# Patient Record
Sex: Female | Born: 1965 | Hispanic: Yes | Marital: Single | Smoking: Never smoker
Health system: Southern US, Community
[De-identification: ages and names within clinical notes are randomized; demographics above are authoritative.]

## PROBLEM LIST (undated history)

## (undated) DIAGNOSIS — T7840XA Allergy, unspecified, initial encounter: Secondary | ICD-10-CM

## (undated) DIAGNOSIS — R42 Dizziness and giddiness: Secondary | ICD-10-CM

## (undated) DIAGNOSIS — E7800 Pure hypercholesterolemia, unspecified: Secondary | ICD-10-CM

## (undated) DIAGNOSIS — M1990 Unspecified osteoarthritis, unspecified site: Secondary | ICD-10-CM

## (undated) DIAGNOSIS — G709 Myoneural disorder, unspecified: Secondary | ICD-10-CM

## (undated) DIAGNOSIS — M7711 Lateral epicondylitis, right elbow: Secondary | ICD-10-CM

## (undated) DIAGNOSIS — G8918 Other acute postprocedural pain: Secondary | ICD-10-CM

## (undated) DIAGNOSIS — Z9889 Other specified postprocedural states: Secondary | ICD-10-CM

## (undated) DIAGNOSIS — Z1231 Encounter for screening mammogram for malignant neoplasm of breast: Secondary | ICD-10-CM

## (undated) DIAGNOSIS — Z01818 Encounter for other preprocedural examination: Secondary | ICD-10-CM

## (undated) DIAGNOSIS — M5126 Other intervertebral disc displacement, lumbar region: Secondary | ICD-10-CM

## (undated) DIAGNOSIS — M48062 Spinal stenosis, lumbar region with neurogenic claudication: Secondary | ICD-10-CM

## (undated) DIAGNOSIS — Z01419 Encounter for gynecological examination (general) (routine) without abnormal findings: Secondary | ICD-10-CM

## (undated) DIAGNOSIS — E669 Obesity, unspecified: Secondary | ICD-10-CM

## (undated) DIAGNOSIS — Z1211 Encounter for screening for malignant neoplasm of colon: Secondary | ICD-10-CM

## (undated) DIAGNOSIS — G4733 Obstructive sleep apnea (adult) (pediatric): Secondary | ICD-10-CM

## (undated) DIAGNOSIS — M25539 Pain in unspecified wrist: Secondary | ICD-10-CM

## (undated) HISTORY — DX: Allergy, unspecified, initial encounter: T78.40XA

## (undated) HISTORY — PX: CERVICAL BIOPSY  W/ LOOP ELECTRODE EXCISION: SUR135

## (undated) HISTORY — PX: DILATION AND CURETTAGE OF UTERUS: SHX78

## (undated) HISTORY — DX: Myoneural disorder, unspecified: G70.9

## (undated) HISTORY — DX: Unspecified osteoarthritis, unspecified site: M19.90

## (undated) HISTORY — DX: Pure hypercholesterolemia, unspecified: E78.00

## (undated) HISTORY — DX: Dizziness and giddiness: R42

## (undated) MED ORDER — METRONIDAZOLE 500 MG TAB
500 mg | ORAL_TABLET | Freq: Two times a day (BID) | ORAL | Status: AC
Start: ? — End: 2012-10-19

---

## 1999-06-11 NOTE — ED Provider Notes (Signed)
[facility]                      EMERGENCY DEPARTMENT TREATMENT REPORT   NAME:  [name] [name], [name] [name]   MR #:  [id number]   BILLING #: [id number]        DOS: 06/11/1999  TIME: 6:39 P   cc:   Primary Physician:   CHIEF COMPLAINT:  Cough.   HISTORY OF PRESENT ILLNESS:   The patient is a 34 year old female with a   two week history of anterior chest pain, nonproductive coughing starting   out initially as a upper respiratory infection.  She was experiencing   increased coughing today and a sensation of "tight breathing" that lasted   approximately a half an hour. Her discomfort is somewhat pleuritic.  She   has had recurrent mild episodes of wheezing.  She denies purulent sputums.   Because of the progression and severity of the patient's symptoms, they   felt obligated to come to the Emergency Department for treatment.   REVIEW OF SYSTEMS:   GENERAL:   She denies chills, fever.   ENT:  Above.   CARDIOVASCULAR:  No chest pain, chest pressure, or palpitations.   GASTROINTESTINAL:  No vomiting, diarrhea, or abdominal pain.   GENITOURINARY:  No dysuria, frequency, or urgency.   NEUROLOGICAL:  No headaches, sensory or motor symptoms.   Denies complaints in any other system.   PAST MEDICAL HISTORY:   Negative for asthma.  The only treatment she has   had was with pregnancy induced hypertension, is not currently hypertensive.   Does not have  primary care physician.   SOCIAL HISTORY:  Negative for current tobacco or alcohol use.   ALLERGIES:   None.   MEDICATIONS:   None.   PHYSICAL EXAMINATION:   GENERAL:   Alert and appropriate.   VITAL SIGNS:   Blood pressure 129/66, pulse 91, respirations 18,   temperature 98.5, O2 sat 98% on room air.   ENT:  Mouth/Throat:  Surfaces of the pharynx, palate, and tongue are pink,   moist, and without lesions.   RESPIRATORY:  Clear and equal BS.  No respiratory distress, tachypnea, or   accessory muscle use.    CARDIOVASCULAR:  Heart regular, without murmurs, gallops, rubs, or thrills.   PMI not displaced.   GASTROINTESTINAL:  Abdomen soft, nontender, without complaint of pain to   palpation.  No hepatomegaly or splenomegaly.   SKIN:  Warm and dry without rashes.   MUSCULOSKELETAL:   Stance and gait appear normal.   NEUROLOGICAL:   Cranial nerves, deep tendon reflexes, strength, and light   touch sensation are unremarkable.   DIAGNOSTIC TESTING:   EKG was a normal sinus rhythm without ischemic   changes.  Chest x-ray interpreted by the radiologist was negative.   COURSE IN THE EMERGENCY DEPARTMENT:   The patient remained stable.  The   patient's family was in the department and kept up-to-date on the patient's   condition and test results.   FINAL DIAGNOSIS:   1.  Acute bronchitis.   DISPOSITION:    The patient was discharged with Erythromycin, Tussionex as   needed.  She was given the office number of the on-call primary care   physician or she may return to the Emergency Department any time should   there be a change in the patient's condition or the onset of new or   worsening symptoms.   Electronically Signed By:   Daine Gravel  Claudette Laws, M.D. 06/23/1999 19:36   ____________________________   Thornton Dales, M.D.   ec D:  06/11/1999 T:  06/14/1999 12:18 P   [id number]

## 1999-09-06 ENCOUNTER — Emergency Department (HOSPITAL_COMMUNITY): Admission: EM | Admit: 1999-09-06 | Discharge: 1999-09-06 | Payer: Self-pay | Admitting: Emergency Medicine

## 2000-01-12 ENCOUNTER — Other Ambulatory Visit: Admission: RE | Admit: 2000-01-12 | Discharge: 2000-01-12 | Payer: Self-pay | Admitting: Gynecology

## 2001-01-13 ENCOUNTER — Encounter: Admission: RE | Admit: 2001-01-13 | Discharge: 2001-01-13 | Payer: Self-pay | Admitting: Emergency Medicine

## 2001-01-13 ENCOUNTER — Encounter: Payer: Self-pay | Admitting: Emergency Medicine

## 2001-01-25 ENCOUNTER — Other Ambulatory Visit: Admission: RE | Admit: 2001-01-25 | Discharge: 2001-01-25 | Payer: Self-pay | Admitting: Gynecology

## 2001-11-10 ENCOUNTER — Other Ambulatory Visit: Admission: RE | Admit: 2001-11-10 | Discharge: 2001-11-10 | Payer: Self-pay | Admitting: *Deleted

## 2001-11-14 ENCOUNTER — Ambulatory Visit (HOSPITAL_COMMUNITY): Admission: RE | Admit: 2001-11-14 | Discharge: 2001-11-14 | Payer: Self-pay | Admitting: *Deleted

## 2001-11-14 ENCOUNTER — Encounter (INDEPENDENT_AMBULATORY_CARE_PROVIDER_SITE_OTHER): Payer: Self-pay | Admitting: Specialist

## 2002-01-19 ENCOUNTER — Other Ambulatory Visit: Admission: RE | Admit: 2002-01-19 | Discharge: 2002-01-19 | Payer: Self-pay | Admitting: *Deleted

## 2002-07-17 ENCOUNTER — Other Ambulatory Visit: Admission: RE | Admit: 2002-07-17 | Discharge: 2002-07-17 | Payer: Self-pay | Admitting: Gynecology

## 2003-01-17 ENCOUNTER — Inpatient Hospital Stay (HOSPITAL_COMMUNITY): Admission: AD | Admit: 2003-01-17 | Discharge: 2003-01-19 | Payer: Self-pay | Admitting: Gynecology

## 2003-03-08 ENCOUNTER — Other Ambulatory Visit: Admission: RE | Admit: 2003-03-08 | Discharge: 2003-03-08 | Payer: Self-pay | Admitting: Internal Medicine

## 2003-03-24 HISTORY — PX: VAGINAL HYSTERECTOMY: SUR661

## 2003-10-23 ENCOUNTER — Encounter (INDEPENDENT_AMBULATORY_CARE_PROVIDER_SITE_OTHER): Payer: Self-pay | Admitting: Specialist

## 2003-10-23 ENCOUNTER — Observation Stay (HOSPITAL_COMMUNITY): Admission: RE | Admit: 2003-10-23 | Discharge: 2003-10-25 | Payer: Self-pay | Admitting: Gynecology

## 2003-11-02 NOTE — ED Provider Notes (Signed)
[facility]                      EMERGENCY DEPARTMENT TREATMENT REPORT   NAME:  [name] [name], [name] [name]                         PT. LOCATION:     ER  ER08   MR #:         BILLING #: [id number]          DOS: 11/02/2003   TIME: 5:48 P   43-28-53   cc:   Primary Physician:   CHIEF COMPLAINT: Left lower quadrant pain.   HISTORY OF PRESENT ILLNESS:  This is a 38 year old female who complains of   intermittent left lower quadrant pain sharp in nature for about 3 days.   She says she gets hot and cold with this, some nausea, negative   vomiting/diarrhea, negative change in bowel habits with a change in color   or frequency, negative change in urinary habits.  Denies any vaginal   discharge or bleeding. She states the first day of her last menstrual   period was 10-22-03 which was a little bit heavier than normal, but   otherwise no complications or changes.  She has negative fevers.   REVIEW OF SYSTEMS   CONSTITUTIONAL:  No fever, chills, weight loss.   EYES: No visual symptoms.   ENT: No sore throat, runny nose or other URI symptoms.   ENDOCRINE:  No diabetic symptoms.   HEMATOLOGIC/LYMPHATIC:  No excessive bruising or lymph node swelling.   ALLERGIC/IMMUNOLOGIC:  No urticaria or allergy symptoms.   RESPIRATORY:  No cough, shortness of breath, or wheezing.   CARDIOVASCULAR:  No chest pain, chest pressure, or palpitations.   GASTROINTESTINAL:   Left lower quadrant pain.   GENITOURINARY:  No dysuria, frequency, or urgency.   MUSCULOSKELETAL:  No joint pain or swelling.   INTEGUMENTARY:  No rashes.   NEUROLOGICAL:  No headaches, sensory or motor symptoms.   PSYCHIATRIC:  No suicidal or homicidal ideation.   PAST MEDICAL HISTORY:  Includes environmental allergies.   ALLERGIES:  No known drug allergies.   MEDICATIONS:  Rhinocort.   SOCIAL HISTORY: The patient is a nonsmoker.   PAST SURGICAL HISTORY:  Includes cholecystectomy and tubal ligation 6 years   ago.   PHYSICAL EXAMINATION:    GENERAL:   The patient is alert, oriented x3, no apparent distress.   VITAL SIGNS:  Blood pressure 144/83, pulse 100, respirations 18,   temperature 99.2.   GENERAL APPEARANCE:  The patient appears well developed and well nourished.   Appearance and behavior are age and situation appropriate.   HEENT:   Eyes:  Conjunctivae clear, lids normal.  Pupils equal,   symmetrical, and normally reactive.   Ears/Nose:  Hearing is grossly intact   to voice.  Internal and external examinations of the ears are unremarkable.   Mouth/Throat:  Surfaces of the pharynx, palate, and tongue are pink, moist,   and without lesions.   RESPIRATORY:  Clear and equal breath sounds.  No respiratory distress,   tachypnea, or accessory muscle use.   CARDIOVASCULAR:  Heart regular, without murmurs, gallops, rubs, or thrills.   PMI not displaced.   GASTROINTESTINAL:   No abdominal or inguinal masses appreciated by   inspection or palpation. Positive mild tenderness to palpation in the left   lower quadrant.  Negative rebound, rigidity or guarding.  Bowel sounds  positive.   GENITOURINARY:  Gynecologic examination:  Negative external lesions,   negative discharge, negative blood.  The os is visualized, closed.   Negative cervical motion tenderness, negative ovarian masses appreciated,   negative ovarian tenderness.   SKIN:  Warm and dry without rashes.   NEUROLOGIC:  Cranial nerves, deep tendon reflexes, strength, and light   touch sensation are unremarkable.   PSYCHIATRIC:  Judgment appears appropriate. Recent and remote memory   appears to be intact.    Oriented to time, place and person.  Mood and   affect appropriate.   INITIAL ASSESSMENT AND MANAGEMENT PLAN:  The patient will have abdominal   laboratories for abdominal pain etiology versus urine for genitourinary and   STD workup with wet prep for possible gynecologic.  This is a new problem   for this patient.  Nursing notes were reviewed.   CONTINUATION BY DR. Barry Dienes:    COURSE IN THE EMERGENCY DEPARTMENT:   The patient remained stable in the   emergency department.  Had no further exacerbation of symptoms.  Was   walking up and around in the room, dressed when I went to go follow up on   him.  Repeat vital signs were blood pressure 150/89, pulse 89, respirations   18, temperature 98.   LABORATORY STUDIES:  Urine was negative for nitrites, blood, leukocytes,   and pregnancy.  General chemistry panel was completely normal.  Liver   enzymes normal.  Amylase was 54, lipase 240.  Pelvic microscopic:  Negative   yeast or trichomonas, no clue cells.  CBC with white count 5.9, hemoglobin   9.3, hematocrit 29.4, platelets 319,000, segmented neutrophils 55%.   DIAGNOSIS:   Intermittent left lower quadrant pain/ abdominal pain.   DISPOSITION/PLAN: The patient discharged home. Instructed to return to the   emergency department if worsening or further concerns, bloody stools.   Electronically Signed By:   Pasty Arch, M.D. 11/03/2003 21:29   ____________________________   Pasty Arch, M.D.   rew/jdm  D:  11/02/2003  T:  11/03/2003 10:18 A   [id number]

## 2004-10-14 ENCOUNTER — Encounter (INDEPENDENT_AMBULATORY_CARE_PROVIDER_SITE_OTHER): Payer: Self-pay | Admitting: *Deleted

## 2004-10-14 ENCOUNTER — Ambulatory Visit: Payer: Self-pay | Admitting: Obstetrics and Gynecology

## 2004-11-20 ENCOUNTER — Ambulatory Visit: Payer: Self-pay | Admitting: Obstetrics and Gynecology

## 2005-09-18 NOTE — ED Provider Notes (Signed)
[facility]                      EMERGENCY DEPARTMENT TREATMENT REPORT   AMENDED COPY   NAME:  [name] [name], [name] [name]                     PT. LOCATION:   MR #:         BILLING #: [id number]          DOS: 09/18/2005   TIME: 5:19 P   43-28-53   cc:   Primary Physician:   CHIEF COMPLAINT:  Clotting.   HISTORY OF PRESENT ILLNESS:  This is a 40 year old female who presents to   the emergency department complaining that, with her menstrual cycles, she   has noticed large amounts of blood clots.  She has had no abdominal pain.   No back pain.  No fevers or chills.  No nausea or vomiting.  States that   she used to have increased vaginal bleeding after being put on birth   control pills that slowed down.  She has noticed that, on the 1st 1-2 days,   she will have some minimal clotting; but, this time on her cycle, she has   noticed large amounts of clots but no increased bleeding.  She takes iron   for iron deficiency anemia.  She states that her last hemoglobin was 11,   she remembers.  She has no vaginal discharge.  No urinary tract symptoms.   No nausea, vomiting, or diarrhea.  No abdominal pain.  Good appetite.   REVIEW OF SYSTEMS:   CONSTITUTIONAL:  No fever, chills, or weight loss.   EYES:  No visual symptoms.   ENT:  No sore throat, runny nose, or other URI symptoms.   ENDOCRINE:  No diabetic symptoms.   HEMATOLOGIC/LYMPHATIC:  No excessive bruising or lymph node swelling.   ALLERGIC/IMMUNOLOGIC:  No urticaria or allergy symptoms.   RESPIRATORY:  No cough, shortness of breath, or wheezing.   CARDIOVASCULAR:  No chest pain, chest pressure, or palpitations.   GASTROINTESTINAL:  No vomiting, diarrhea, or abdominal pain.   GENITOURINARY:  Complaining of vaginal clotting.   Denies complaints in any other system.   PAST MEDICAL HISTORY:  Negative.  The patient has seasonal allergies.   FAMILY HISTORY:  Negative.   SOCIAL HISTORY:  Negative.   ALLERGIES:  No known drug allergies.    MEDICATIONS:  Rhinocort.   PHYSICAL EXAMINATION:   VITAL SIGNS:  Blood pressure is 147/90, pulse 87, respirations 21, and   temperature 98.6.  On a 0-10 pain scale, 4/10.  Saturations 97%.  Prior to   discharge, blood pressure is 145/89, pulse 88, and respirations 16 with 0   pain.  The patient has no pain right now.  I had orthostatics done, and   those were normal as well.   HEENT:   Anicteric sclerae.  Nasopharynx is unremarkable.  Oropharynx is   clear.   NECK:  Supple.   RESPIRATORY:  Clear and equal breath sounds.  No respiratory distress,   tachypnea, or accessory muscle use.   CARDIOVASCULAR:  Heart regular, without murmurs, gallops, rubs, or thrills.   PMI not displaced.   GASTROINTESTINAL:  Abdomen is soft and nontender. No palpable masses,   rebound or guarding.   BACK: No flank tenderness.   SKIN: Warm and dry.   IMPRESSION/PLAN:  This is a new problem for this patient.  Old records were  reviewed.  No additional relevant information was obtained.  The patient's   history was discussed with family members.  No additional relevant   information was obtained.  Nursing notes were reviewed.  We will go ahead   and get catheter UA and i-STAT to assess H&amp;H.   DIAGNOSTIC STUDIES:  H&amp;H was 11.6 and 34.  BMP completely normal.   Urinalysis was negative for pregnancy, nitrites, leukocytes, and blood.   FINAL DIAGNOSIS:  Dysfunctional uterine bleeding.   PLAN:  The patient was told to follow up with her OB/GYN for further   followup.  Return back if there are any further problems.  She can go back   to work on 09/20/05 per patient request.  She was told to take ibuprofen if   she happens to have any menstrual pain.   Electronically Signed By:   Haze Justin, M.D. 10/02/2005 14:34   ____________________________   Haze Justin, M.D.   My signature above authenticates this document and my orders, the final   diagnosis(es), discharge prescription(s) and instructions in the [facility] record.    st/jdm  D:  09/21/2005  T:  09/24/2005  5:35 P   [id number](edit)   Hilaria Ota, PA-C

## 2005-09-18 NOTE — ED Provider Notes (Signed)
[facility]                      EMERGENCY DEPARTMENT TREATMENT REPORT   NAME:  [name] [name], [name] [name]                     PT. LOCATION:   MR #:         BILLING #: [id number]          DOS: 09/18/2005   TIME:   43-28-53   cc:   Primary Physician:   CHIEF COMPLAINT:  Clotting.   HISTORY OF PRESENT ILLNESS:  This is a 40 year old female who presents to   the emergency department complaining that, with her menstrual cycles, she   has noticed large amounts of blood clots.  She has had no abdominal pain.   No back pain.  No fevers or chills.  No nausea or vomiting.  States that   she used to have increased vaginal bleeding after being put on birth   control pills that slowed down.  She has noticed that, on the 1st 1-2 days,   she will have some minimal clotting; but, this time on her cycle, she has   noticed large amounts of clots but no increased bleeding.  She takes iron   for iron deficiency anemia.  She states that her last hemoglobin was 11,   she remembers.  She has no vaginal discharge.  No urinary tract symptoms.   No nausea, vomiting, or diarrhea.  No abdominal pain.  Good appetite.   REVIEW OF SYSTEMS:   CONSTITUTIONAL:  No fever, chills, or weight loss.   EYES:  No visual symptoms.   ENT:  No sore throat, runny nose, or other URI symptoms.   ENDOCRINE:  No diabetic symptoms.   HEMATOLOGIC/LYMPHATIC:  No excessive bruising or lymph node swelling.   ALLERGIC/IMMUNOLOGIC:  No urticaria or allergy symptoms.   RESPIRATORY:  No cough, shortness of breath, or wheezing.   CARDIOVASCULAR:  No chest pain, chest pressure, or palpitations.   GASTROINTESTINAL:  No vomiting, diarrhea, or abdominal pain.   GENITOURINARY:  Complaining of vaginal clotting.   Denies complaints in any other system.   PAST MEDICAL HISTORY:  Negative.  The patient has seasonal allergies.   FAMILY HISTORY:  Negative.   SOCIAL HISTORY:  Negative.   ALLERGIES:  No known drug allergies.   MEDICATIONS:  Rhinocort.    PHYSICAL EXAMINATION:   VITAL SIGNS:  Blood pressure is 147/90, pulse 87, respirations 21, and   temperature 98.6.  On a 0-10 pain scale, 4/10.  Saturations 97%.  Prior to   discharge, blood pressure is 145/89, pulse 88, and respirations 16 with 0   pain.  The patient has no pain right now.  I had orthostatics done, and   those were normal as well.   IMPRESSION/PLAN:  This is a new problem for this patient.  Old records were   reviewed.  No additional relevant information was obtained.  The patient's   history was discussed with family members.  No additional relevant   information was obtained.  Nursing notes were reviewed.  We will go ahead   and get catheter UA and i-STAT to assess H&amp;H.   DIAGNOSTIC STUDIES:  H&amp;H was 11.6 and 34.  BMP completely normal.   Urinalysis was negative for pregnancy, nitrites, leukocytes, and blood.   FINAL DIAGNOSIS:  Dysfunctional uterine bleeding.   PLAN:  The patient was told to follow  up with her OB/GYN for further   followup.  Return back if there are any further problems.  She can go back   to work on 09/20/05 per patient request.  She was told to take ibuprofen if   she happens to have any menstrual pain.   Electronically Signed By:   Haze Justin, M.D. 09/22/2005 17:51   ____________________________   Haze Justin, M.D.   My signature above authenticates this document and my orders, the final   diagnosis(es), discharge prescription(s) and instructions in the [facility] record.   st  D:  09/21/2005  T:  09/21/2005 10:33 A   [id number]   Hilaria Ota, PA-C

## 2006-05-13 ENCOUNTER — Ambulatory Visit (HOSPITAL_COMMUNITY): Admission: RE | Admit: 2006-05-13 | Discharge: 2006-05-13 | Payer: Self-pay | Admitting: Family Medicine

## 2008-02-15 ENCOUNTER — Ambulatory Visit: Payer: Self-pay | Admitting: Nurse Practitioner

## 2008-02-16 LAB — CULTURE, HSV W/ TYPING

## 2008-02-16 LAB — HSV TYPING
HSV 2 typing: POSITIVE — AB
HSV-1 typing: NEGATIVE

## 2008-02-20 LAB — HSV TYPE 2-SPECIFIC ABS, IGG W/REFL SUPPLEMENTAL TESTING: HSV-2 Glycoprotein, IgG: 1.03 IV

## 2008-03-21 ENCOUNTER — Encounter (INDEPENDENT_AMBULATORY_CARE_PROVIDER_SITE_OTHER): Payer: Self-pay | Admitting: Nurse Practitioner

## 2008-03-21 ENCOUNTER — Ambulatory Visit: Payer: Self-pay | Admitting: Nurse Practitioner

## 2008-03-21 DIAGNOSIS — K5900 Constipation, unspecified: Secondary | ICD-10-CM | POA: Insufficient documentation

## 2008-03-21 DIAGNOSIS — R3129 Other microscopic hematuria: Secondary | ICD-10-CM | POA: Insufficient documentation

## 2008-03-21 LAB — CONVERTED CEMR LAB
Bilirubin Urine: NEGATIVE
Glucose, Urine, Semiquant: NEGATIVE
KOH Prep: NEGATIVE
Ketones, urine, test strip: NEGATIVE
Nitrite: NEGATIVE
Specific Gravity, Urine: 1.02
pH: 6.5

## 2008-03-22 ENCOUNTER — Encounter (INDEPENDENT_AMBULATORY_CARE_PROVIDER_SITE_OTHER): Payer: Self-pay | Admitting: Nurse Practitioner

## 2008-03-22 LAB — CONVERTED CEMR LAB
ALT: 11 units/L (ref 0–35)
AST: 13 units/L (ref 0–37)
Basophils Absolute: 0 10*3/uL (ref 0.0–0.1)
Basophils Relative: 0 % (ref 0–1)
Calcium: 9.7 mg/dL (ref 8.4–10.5)
Chlamydia, DNA Probe: NEGATIVE
Chloride: 103 meq/L (ref 96–112)
Creatinine, Ser: 0.55 mg/dL (ref 0.40–1.20)
Hemoglobin: 14.3 g/dL (ref 12.0–15.0)
LDL Cholesterol: 166 mg/dL — ABNORMAL HIGH (ref 0–99)
MCHC: 32.6 g/dL (ref 30.0–36.0)
Monocytes Absolute: 0.2 10*3/uL (ref 0.1–1.0)
Neutro Abs: 4.4 10*3/uL (ref 1.7–7.7)
RDW: 13 % (ref 11.5–15.5)
TSH: 1.526 microintl units/mL (ref 0.350–4.50)
Total Bilirubin: 1.1 mg/dL (ref 0.3–1.2)
Total CHOL/HDL Ratio: 5
VLDL: 24 mg/dL (ref 0–40)

## 2008-03-30 ENCOUNTER — Telehealth (INDEPENDENT_AMBULATORY_CARE_PROVIDER_SITE_OTHER): Payer: Self-pay | Admitting: Nurse Practitioner

## 2008-04-05 ENCOUNTER — Ambulatory Visit (HOSPITAL_COMMUNITY): Admission: RE | Admit: 2008-04-05 | Discharge: 2008-04-05 | Payer: Self-pay | Admitting: Internal Medicine

## 2008-04-05 ENCOUNTER — Encounter (INDEPENDENT_AMBULATORY_CARE_PROVIDER_SITE_OTHER): Payer: Self-pay | Admitting: Nurse Practitioner

## 2008-04-12 ENCOUNTER — Encounter (INDEPENDENT_AMBULATORY_CARE_PROVIDER_SITE_OTHER): Payer: Self-pay | Admitting: Nurse Practitioner

## 2008-06-05 ENCOUNTER — Encounter (INDEPENDENT_AMBULATORY_CARE_PROVIDER_SITE_OTHER): Payer: Self-pay | Admitting: Nurse Practitioner

## 2008-06-06 ENCOUNTER — Encounter: Admission: RE | Admit: 2008-06-06 | Discharge: 2008-06-06 | Payer: Self-pay | Admitting: Family Medicine

## 2008-10-31 ENCOUNTER — Telehealth (INDEPENDENT_AMBULATORY_CARE_PROVIDER_SITE_OTHER): Payer: Self-pay | Admitting: Nurse Practitioner

## 2008-11-12 ENCOUNTER — Ambulatory Visit: Payer: Self-pay | Admitting: Nurse Practitioner

## 2008-11-13 ENCOUNTER — Encounter (INDEPENDENT_AMBULATORY_CARE_PROVIDER_SITE_OTHER): Payer: Self-pay | Admitting: Nurse Practitioner

## 2008-11-13 LAB — CONVERTED CEMR LAB: LDL Cholesterol: 152 mg/dL — ABNORMAL HIGH (ref 0–99)

## 2009-03-13 ENCOUNTER — Ambulatory Visit: Payer: Self-pay | Admitting: Nurse Practitioner

## 2009-03-20 ENCOUNTER — Encounter (INDEPENDENT_AMBULATORY_CARE_PROVIDER_SITE_OTHER): Payer: Self-pay | Admitting: Nurse Practitioner

## 2009-03-20 LAB — CONVERTED CEMR LAB
Cholesterol: 244 mg/dL — ABNORMAL HIGH (ref 0–200)
HDL: 49 mg/dL (ref >39)
Total CHOL/HDL Ratio: 5
Triglycerides: 136 mg/dL (ref <150)

## 2009-04-15 ENCOUNTER — Ambulatory Visit (HOSPITAL_COMMUNITY): Admission: RE | Admit: 2009-04-15 | Discharge: 2009-04-15 | Payer: Self-pay | Admitting: Internal Medicine

## 2009-04-19 ENCOUNTER — Encounter: Admission: RE | Admit: 2009-04-19 | Discharge: 2009-04-19 | Payer: Self-pay | Admitting: Internal Medicine

## 2009-05-27 ENCOUNTER — Ambulatory Visit: Payer: Self-pay | Admitting: Internal Medicine

## 2009-05-27 ENCOUNTER — Encounter (INDEPENDENT_AMBULATORY_CARE_PROVIDER_SITE_OTHER): Payer: Self-pay | Admitting: Nurse Practitioner

## 2009-05-27 DIAGNOSIS — E785 Hyperlipidemia, unspecified: Secondary | ICD-10-CM | POA: Insufficient documentation

## 2009-05-28 ENCOUNTER — Encounter (INDEPENDENT_AMBULATORY_CARE_PROVIDER_SITE_OTHER): Payer: Self-pay | Admitting: Nurse Practitioner

## 2009-05-28 LAB — CONVERTED CEMR LAB
Bilirubin, Direct: 0.1 mg/dL (ref 0.0–0.3)
Indirect Bilirubin: 0.6 mg/dL (ref 0.0–0.9)
LDL Cholesterol: 146 mg/dL — ABNORMAL HIGH (ref 0–99)
VLDL: 33 mg/dL (ref 0–40)

## 2009-07-01 ENCOUNTER — Ambulatory Visit: Payer: Self-pay | Admitting: Nurse Practitioner

## 2009-07-01 DIAGNOSIS — N3000 Acute cystitis without hematuria: Secondary | ICD-10-CM | POA: Insufficient documentation

## 2009-07-01 LAB — CONVERTED CEMR LAB
Bilirubin Urine: NEGATIVE
Cholesterol, target level: 200 mg/dL
HDL goal, serum: 40 mg/dL
Ketones, urine, test strip: NEGATIVE
LDL Goal: 160 mg/dL
Nitrite: POSITIVE
Specific Gravity, Urine: >=1.03
pH: 5.5

## 2009-07-02 ENCOUNTER — Encounter (INDEPENDENT_AMBULATORY_CARE_PROVIDER_SITE_OTHER): Payer: Self-pay | Admitting: Nurse Practitioner

## 2009-07-10 ENCOUNTER — Encounter (INDEPENDENT_AMBULATORY_CARE_PROVIDER_SITE_OTHER): Payer: Self-pay | Admitting: Nurse Practitioner

## 2009-11-19 NOTE — Op Note (Signed)
[facility]   Operation Report   NAME:  [name], [name]   SEX:   F   DATE: 11/19/2009   DOB: [birth year]   MR#    [id number]   ROOM:     ACCT#  [id number]       cc: Milford Cage MD       PREOPERATIVE DIAGNOSIS:   Irregular menses and menorrhagia.       POSTOPERATIVE DIAGNOSIS:   Irregular menses and menorrhagia with cervical polyp        SURGICAL PROCEDURE PERFORMED:   Hysteroscopy, dilation and curettage.       SURGEON:   Sigmund Hazel, M.D.       ANESTHESIOLOGIST:   Dr. Jackson Latino.       ANESTHESIA TYPE:   General       FLUIDS:   500 mL crystalloid       ESTIMATED BLOOD LOSS:   Minimal.       URINE OUTPUT:   50 mL of clear urine via straight catheterization.       FLUID DEFICIT:   350 cc       OPERATIVE TIME:   Surgery started 7:52.  End time is 8:10       COMPLICATIONS:   Perforation of the uterus.       FINDINGS:   A cervical polyp seen at the endocervical os.       DESCRIPTION OF PROCEDURE:   The patient was seen in the preop area at which point in time the risks and    benefits of the surgical procedure were reviewed with the patient.  The    patient expressed she understood and desired to proceed on with surgical case.     The patient was taken to the operating room.  Once in the operating room,    the patient was placed under general anesthesia and her legs were properly    positioned in the candy-cane stirrups.  The patient then was cleaned and    draped in a sterile fashion.  Afterwards a weighted speculum was placed in the    vaginal vault and a right angle retractor was used to visualize the cervix.     Please note that the patient on sonogram was found to have a retroverted    uterus.  On exam the uterus appeared to be very retroverted.  At this time the    anterior lip of the cervix was grasped with a single-tooth tenaculum and    pulled down to bring the uterus into a straight plane.  The cervical length    was taken which was initially 4 cm.  An attempt was made to sound the uterus     completely at which point in time the uterine was found to go through up to a    length of 15 cm and suspicion was there is a possible perforation secondary    to the way that the uterus was palpated on exam.  Afterwards, the uterine    sound was removed.  Please note prior to which upon inspection of the cervix    the patient was found to have a cervical polyp that was visible on    visualization of the cervix.  Polyp forceps then was placed on the polyp and    then it was turned in a clockwise presentation and the cervical polyp removed    and was placed on Telfa and labeled appropriately cervical polyp.     Afterwards, the  hysteroscope was placed into the endocervical and onto the    endometrial canal and secondary to the presentation of the uterus and    visualization of the area of possible perforation, it was difficult to get the    hysteroscope to position to go around the angle to adequately visualize the    fundus of the uterus.  At this time, the decision was made to abort the    procedure secondary to the fluid deficit being at 350 and inability to    visualize the fundus appropriately.  Afterwards, the endocervical curetting    was done with the Kevorkian curet and was placed on the Telfa and afterwards    an attempt was done to do endometrial curetting as well.  Afterwards the    specimen was placed on Telfa and placed in formalin containers and labeled    appropriately.  The single-tooth tenaculum was removed from the anterior lip    of the cervix.  Good hemostasis was accomplished with pressure was Monsel's on    a sponge on a stick.  Afterwards, the patient was undraped and cleaned and    legs taken out of the candy-cane stirrups and the patient was taken off the    surgical table onto the gurney and taken to the recovery area in stable    condition.       Please note, the patient did not receive the NovaSure placement.           ___________________   [name] [name] M.D.   Dictated By:.    mad    D:11/19/2009   T: 11/19/2009 17:41:02   [id number]

## 2009-11-19 NOTE — Op Note (Signed)
[facility]   Operation Report   NAME:  [name] [name], [name] [name]   SEX:   F   DATE: 11/19/2009   DOB: 08/20/1965   MR#    [id number]   ROOM:     ACCT#  [id number]       cc: ANGELINA FRIAS MD       PREOPERATIVE DIAGNOSIS:   Irregular menses and menorrhagia.       POSTOPERATIVE DIAGNOSIS:   Irregular menses and menorrhagia with cervical polyp        SURGICAL PROCEDURE PERFORMED:   Hysteroscopy, dilation and curettage.       SURGEON:   Michael Grimes, M.D.       ANESTHESIOLOGIST:   Dr. Pecsok.       ANESTHESIA TYPE:   General       FLUIDS:   500 mL crystalloid       ESTIMATED BLOOD LOSS:   Minimal.       URINE OUTPUT:   50 mL of clear urine via straight catheterization.       FLUID DEFICIT:   350 cc       OPERATIVE TIME:   Surgery started 7:52.  End time is 8:10       COMPLICATIONS:   Perforation of the uterus.       FINDINGS:   A cervical polyp seen at the endocervical os.       DESCRIPTION OF PROCEDURE:   The patient was seen in the preop area at which point in time the risks and    benefits of the surgical procedure were reviewed with the patient.  The    patient expressed she understood and desired to proceed on with surgical case.     The patient was taken to the operating room.  Once in the operating room,    the patient was placed under general anesthesia and her legs were properly    positioned in the candy-cane stirrups.  The patient then was cleaned and    draped in a sterile fashion.  Afterwards a weighted speculum was placed in the    vaginal vault and a right angle retractor was used to visualize the cervix.     Please note that the patient on sonogram was found to have a retroverted    uterus.  On exam the uterus appeared to be very retroverted.  At this time the    anterior lip of the cervix was grasped with a single-tooth tenaculum and    pulled down to bring the uterus into a straight plane.  The cervical length    was taken which was initially 4 cm.  An attempt was made to sound the uterus     completely at which point in time the uterine was found to go through up to a    length of 15 cm and suspicion was there is a possible perforation secondary    to the way that the uterus was palpated on exam.  Afterwards, the uterine    sound was removed.  Please note prior to which upon inspection of the cervix    the patient was found to have a cervical polyp that was visible on    visualization of the cervix.  Polyp forceps then was placed on the polyp and    then it was turned in a clockwise presentation and the cervical polyp removed    and was placed on Telfa and labeled appropriately cervical polyp.     Afterwards, the   hysteroscope was placed into the endocervical and onto the    endometrial canal and secondary to the presentation of the uterus and    visualization of the area of possible perforation, it was difficult to get the    hysteroscope to position to go around the angle to adequately visualize the    fundus of the uterus.  At this time, the decision was made to abort the    procedure secondary to the fluid deficit being at 350 and inability to    visualize the fundus appropriately.  Afterwards, the endocervical curetting    was done with the Kevorkian curet and was placed on the Telfa and afterwards    an attempt was done to do endometrial curetting as well.  Afterwards the    specimen was placed on Telfa and placed in formalin containers and labeled    appropriately.  The single-tooth tenaculum was removed from the anterior lip    of the cervix.  Good hemostasis was accomplished with pressure was Monsel's on    a sponge on a stick.  Afterwards, the patient was undraped and cleaned and    legs taken out of the candy-cane stirrups and the patient was taken off the    surgical table onto the gurney and taken to the recovery area in stable    condition.       Please note, the patient did not receive the NovaSure placement.           ___________________   Michael P Grimes M.D.   Dictated By:.    mad    D:11/19/2009   T: 11/19/2009 17:41:02   [id number]

## 2010-03-19 ENCOUNTER — Ambulatory Visit: Payer: Self-pay | Admitting: Nurse Practitioner

## 2010-03-19 ENCOUNTER — Encounter (INDEPENDENT_AMBULATORY_CARE_PROVIDER_SITE_OTHER): Payer: Self-pay | Admitting: Nurse Practitioner

## 2010-03-19 ENCOUNTER — Encounter (INDEPENDENT_AMBULATORY_CARE_PROVIDER_SITE_OTHER): Payer: Self-pay | Admitting: Internal Medicine

## 2010-03-19 LAB — CONVERTED CEMR LAB
Albumin: 4.6 g/dL (ref 3.5–5.2)
Alkaline Phosphatase: 76 units/L (ref 39–117)
BUN: 11 mg/dL (ref 6–23)
Bilirubin Urine: NEGATIVE
CO2: 28 meq/L (ref 19–32)
Calcium: 9.3 mg/dL (ref 8.4–10.5)
Chlamydia, DNA Probe: NEGATIVE
Chloride: 102 meq/L (ref 96–112)
GC Probe Amp, Genital: NEGATIVE
Glucose, Bld: 75 mg/dL (ref 70–99)
HDL: 41 mg/dL (ref >39)
LDL Cholesterol: 144 mg/dL — ABNORMAL HIGH (ref 0–99)
Lymphocytes Relative: 37 % (ref 12–46)
Lymphs Abs: 2 10*3/uL (ref 0.7–4.0)
MCV: 96.6 fL (ref 78.0–100.0)
Monocytes Relative: 5 % (ref 3–12)
Neutro Abs: 2.9 10*3/uL (ref 1.7–7.7)
Neutrophils Relative %: 54 % (ref 43–77)
Platelets: 273 10*3/uL (ref 150–400)
Potassium: 3.8 meq/L (ref 3.5–5.3)
RBC: 4.38 M/uL (ref 3.87–5.11)
Rapid HIV Screen: NEGATIVE
Sodium: 140 meq/L (ref 135–145)
Total Protein: 7.4 g/dL (ref 6.0–8.3)
Triglycerides: 179 mg/dL — ABNORMAL HIGH (ref <150)
Urobilinogen, UA: 0.2
WBC Urine, dipstick: NEGATIVE
WBC: 5.3 10*3/uL (ref 4.0–10.5)

## 2010-03-20 ENCOUNTER — Encounter (INDEPENDENT_AMBULATORY_CARE_PROVIDER_SITE_OTHER): Payer: Self-pay | Admitting: Nurse Practitioner

## 2010-03-20 ENCOUNTER — Telehealth (INDEPENDENT_AMBULATORY_CARE_PROVIDER_SITE_OTHER): Payer: Self-pay | Admitting: Nurse Practitioner

## 2010-03-20 LAB — CONVERTED CEMR LAB: Pap Smear: NEGATIVE

## 2010-04-22 ENCOUNTER — Encounter
Admission: RE | Admit: 2010-04-22 | Discharge: 2010-04-22 | Payer: Self-pay | Source: Home / Self Care | Attending: Family Medicine | Admitting: Family Medicine

## 2010-04-22 ENCOUNTER — Ambulatory Visit (HOSPITAL_COMMUNITY): Admission: RE | Admit: 2010-04-22 | Payer: Self-pay | Source: Home / Self Care | Admitting: Internal Medicine

## 2010-04-22 NOTE — Assessment & Plan Note (Signed)
Summary: Acute - Cystitis   Vital Signs:  Patient profile:   45 year old female Weight:      143.56 pounds Temp:     97.8 degrees F Pulse rate:   72 / minute Pulse rhythm:   regular Resp:     14 per minute BP sitting:   109 / 70  (left arm) Cuff size:   regular  Vitals Entered By: Chauncy Passy, SMA CC: Pt. is here for a poss. UTI. She states she has some abd. pain that radiates to the lower back. This began on Friday (05/28/09). , Dysuria, Lipid Management Is Patient Diabetic? No Pain Assessment Patient in pain? yes     Location: abdomen Intensity: 8 Type: sharp Onset of pain  Constant  Does patient need assistance? Functional Status Self care Ambulation Normal   CC:  Pt. is here for a poss. UTI. She states she has some abd. pain that radiates to the lower back. This began on Friday (05/28/09). , Dysuria, and Lipid Management.  History of Present Illness:  Dysuria      This is a 45 year old woman who presents with Dysuria.  The symptoms began 3 days ago.  The intensity is described as moderate.  The patient complains of burning with urination and urinary frequency, but denies hematuria, vaginal discharge, and vaginal itching.  Associated symptoms include abdominal pain and back pain.  The patient denies the following associated symptoms: nausea, vomiting, and fever.  The patient denies the following risk factors: diabetes, prior antibiotics, and pregnancy.  History is significant for no urinary tract problems.    Lipid Management History:      Positive NCEP/ATP III risk factors include HDL cholesterol less than 40.  Negative NCEP/ATP III risk factors include female age less than 74 years old, no family history for ischemic heart disease, and non-tobacco-user status.     Current Medications (verified): 1)  Simvastatin 20 Mg Tabs (Simvastatin) .... One Tablet By Mouth Nightly For Cholesterol  Allergies (verified): No Known Drug Allergies  Review of Systems GI:  Complains  of abdominal pain; denies nausea and vomiting. GU:  Complains of dysuria and urinary frequency; denies discharge. MS:  Complains of low back pain.  Physical Exam  General:  alert.   Head:  normocephalic.   Lungs:  normal breath sounds.   Heart:  normal rate and regular rhythm.   Abdomen:  bil flank pain Msk:  up to the exam table but obvious discomfort Neurologic:  alert & oriented X3.   Skin:  color normal.   Psych:  Oriented X3.     Impression & Recommendations:  Problem # 1:  ACUTE CYSTITIS (ICD-595.0) reviewed dx with pt will send urine for culture  advised pt to drink plenty of water Her updated medication list for this problem includes:    Bactrim 400-80 Mg Tabs (Sulfamethoxazole-trimethoprim) ..... One tablet by mouth two times a day for infection  Orders: UA Dipstick w/o Micro (manual) ([address]) T-Culture, Urine ([address]454-09811)  Complete Medication List: 1)  Simvastatin 20 Mg Tabs (Simvastatin) .... One tablet by mouth nightly for cholesterol 2)  Bactrim 400-80 Mg Tabs (Sulfamethoxazole-trimethoprim) .... One tablet by mouth two times a day for infection 3)  Tramadol Hcl 50 Mg Tabs (Tramadol hcl) .... One tablet by mouth two times a day as needed for pain  Lipid Assessment/Plan:      Based on NCEP/ATP III, the patient's risk factor category is "0-1 risk factors".  The patient's lipid goals  are as follows: Total cholesterol goal is 200; LDL cholesterol goal is 160; HDL cholesterol goal is 40; Triglyceride goal is 150.    Patient Instructions: 1)  Take antibiotics as ordered 2)  Drink plenty of water  3)  Follow up if you start with vomiting, nausea, blood in urine during this week Prescriptions: TRAMADOL HCL 50 MG TABS (TRAMADOL HCL) One tablet by mouth two times a day as needed for pain  #20 x 0   Entered and Authorized by:   Lehman Prom FNP   Signed by:   Lehman Prom FNP on 07/01/2009   Method used:   Print then Give to Patient   RxID:    [id number] BACTRIM 400-80 MG TABS (SULFAMETHOXAZOLE-TRIMETHOPRIM) One tablet by mouth two times a day for infection  #20 x 0   Entered and Authorized by:   Lehman Prom FNP   Signed by:   Lehman Prom FNP on 07/01/2009   Method used:   Print then Give to Patient   RxID:   [id number]   Laboratory Results   Urine Tests  Date/Time Received: July 01, 2009 4:23 PM   Routine Urinalysis   Color: red Glucose: negative   (Normal Range: Negative) Bilirubin: negative   (Normal Range: Negative) Ketone: negative   (Normal Range: Negative) Spec. Gravity: >=1.030   (Normal Range: 1.003-1.035) Blood: moderate   (Normal Range: Negative) pH: 5.5   (Normal Range: 5.0-8.0) Protein: negative   (Normal Range: Negative) Urobilinogen: 0.2   (Normal Range: 0-1) Nitrite: positive   (Normal Range: Negative) Leukocyte Esterace: negative   (Normal Range: Negative)

## 2010-04-22 NOTE — Letter (Signed)
Summary: Lipid Letter  [facility]  [address] [place], [place] [address]   Phone: [phone]  Fax: [phone]    05/28/2009  Maretta Overdorf [address] [place], [place]  [address]  Dear Steward Drone:  We have carefully reviewed your last lipid profile from 05/27/2009 and the results are noted below with a summary of recommendations for lipid management.    Cholesterol:       215     Goal: less than 200   HDL "good" Cholesterol:   36     Goal: greater than 40   LDL "bad" Cholesterol:   146     Goal: less than 130   Triglycerides:       166     Goal: less than 150    Your cholesterol is still high.You should have spoken with this office about changing your cholesterol medication.  Your should get your cholesterol re-checked in 3 months after starting the new medication.      Current Medications: 1)    Simvastatin 20 Mg Tabs (Simvastatin) .... One tablet by mouth nightly for cholesterol  If you have any questions, please call. We appreciate being able to work with you.   Sincerely,    Lehman Prom, FNP [facility]

## 2010-04-24 NOTE — Letter (Signed)
Summary: Lipid Letter  [facility]  [address] [place], [place] [address]   Phone: [phone]  Fax: [phone]    03/20/2010  Kamee Bobst [address] [place], [place]  [address]  Dear Steward Drone:  We have carefully reviewed your last lipid profile from 03/19/2010 and the results are noted below with a summary of recommendations for lipid management.    Cholesterol:       221     Goal: less than 200   HDL "good" Cholesterol:   41     Goal: greater than 40   LDL "bad" Cholesterol:   144     Goal: less than 130   Triglycerides:       179     Goal: less than 150    Your choleserol is high.  You should have been contacted by this office about changing your cholesterol medications.  You should also start a low fat, low cholesterol diet.  You will need your cholesterol rechecked in 3 months.  All other labs are ok.    Pap Smear resutls are normal.     Current Medications: 1)    Pravastatin Sodium 20 Mg Tabs (Pravastatin sodium) .... 2 tablets by mouth nightly for cholesterol 2)    Senokot S 8.6-50 Mg Tabs (Sennosides-docusate sodium) .... One tablet by mouth two times a day for bowels  If you have any questions, please call. We appreciate being able to work with you.   Sincerely,    Lehman Prom, FNP [facility]

## 2010-04-24 NOTE — Assessment & Plan Note (Signed)
Summary: Complete Physical Exam   Vital Signs:  Patient profile:   45 year old female Menstrual status:  hysterectomy Weight:      146.56 pounds BMI:     28.25 Temp:     98.2 degrees F oral Pulse rate:   64 / minute Pulse rhythm:   regular Resp:     16 per minute BP sitting:   106 / 68  (left arm) Cuff size:   regular  Vitals Entered By: Levon Hedger (March 19, 2010 9:15 AM)  Nutrition Counseling: Patient's BMI is greater than 25 and therefore counseled on weight management options. CC: 45 y/o CPP. , Lipid Management Is Patient Diabetic? No Pain Assessment Patient in pain? no       Does patient need assistance? Functional Status Self care Ambulation Normal LMP - Character: hysterctomy 1995     Menstrual Status hysterectomy Last PAP Result  Specimen Adequacy: Satisfactory for evaluation.   Interpretation/Result:Negative for intraepithelial Lesion or Malignancy.   Interpretation/Result:Fungal organisms c/w Candida present.       CC:  45 y/o CPP.  and Lipid Management.  History of Present Illness:  Pt into the office for a complete physical exam  Pap - done in 2009 - normal. pt is s/p partial hysterectomy.  No menses Previous abnormal PAP about 8 years ago - abnormal cells. reports colposcopy done. No family history of cervical or ovarian cancer. Pt is separated for the past 2 years, 3 children  Mammogram - done last year 04/19/2009 No family history of breast cancer rec breast self exams at home based on last years mammogram - she reports that she does check in the shower  Optho - no current glasses or contacts.  Dental - pt has a Education officer, community and she goes every 6 months.   Lipid Management History:      Positive NCEP/ATP III risk factors include HDL cholesterol less than 40.  Negative NCEP/ATP III risk factors include female age less than 56 years old, no history of early menopause without estrogen hormone replacement, non-diabetic, no family history for  ischemic heart disease, non-tobacco-user status, non-hypertensive, no ASHD (atherosclerotic heart disease), no prior stroke/TIA, no peripheral vascular disease, and no history of aortic aneurysm.        Adjunctive measures started by the patient include aerobic exercise.  She expresses no side effects from her lipid-lowering medication.  Comments include: pt is taking meds as ordered.  The patient denies any symptoms to suggest myopathy or liver disease.     Habits & Providers  Alcohol-Tobacco-Diet     Alcohol drinks/day: 0     Tobacco Status: never  Exercise-Depression-Behavior     Does Patient Exercise: yes     Type of exercise: running,     Drug Use: no     Seat Belt Use: 100     Sun Exposure: occasionally  Comments: PHQ- 9 score - 17  Current Medications (verified): 1)  Simvastatin 20 Mg Tabs (Simvastatin) .... One Tablet By Mouth Nightly For Cholesterol 2)  Bactrim 400-80 Mg Tabs (Sulfamethoxazole-Trimethoprim) .... One Tablet By Mouth Two Times A Day For Infection 3)  Tramadol Hcl 50 Mg Tabs (Tramadol Hcl) .... One Tablet By Mouth Two Times A Day As Needed For Pain  Allergies (verified): No Known Drug Allergies  Review of Systems General:  Denies fever. Eyes:  Denies discharge. ENT:  Denies nasal congestion. CV:  Denies chest pain or discomfort. Resp:  Denies cough. GI:  Complains of constipation. GU:  Denies  discharge. MS:  Denies joint pain. Derm:  Denies dryness. Neuro:  Denies headaches. Psych:  Denies anxiety and depression. Endo:  Denies excessive urination.  Physical Exam  General:  alert.   Head:  normocephalic.   Eyes:  pupils round.  reactive Ears:  ear piercing(s) noted.  bil tm with bony landmarks present Nose:  no nasal discharge.   Mouth:  fair dentition.   Neck:  supple.   Chest Wall:  no mass.   Breasts:  no masses and no abnormal thickening.   Lungs:  normal breath sounds.   Heart:  normal rate and regular rhythm.   Abdomen:  soft,  non-tender, and normal bowel sounds.   Rectal:  no external abnormalities.   Msk:  up to the exam table Extremities:  no edema Neurologic:  alert & oriented X3 and gait normal.   Skin:  color normal.   Psych:  Oriented X3.    Pelvic Exam  Vulva:      normal appearance.   Urethra and Bladder:      Urethra--no discharge.   Vagina:      normal.   Cervix:      absent Uterus:      absent Adnexa:      nontender bilaterally.   Rectum:      normal, heme negative stool.      Impression & Recommendations:  Problem # 1:  ROUTINE GYNECOLOGICAL EXAMINATION (ICD-V72.31) PHQ-9 score = 17 rec optho and dental exam PAP done Orders: KOH/ WET Mount [phone]) Pap Smear, Thin Prep ( Collection of) [phone]) UA Dipstick w/o Micro (manual) (65784) T- GC Chlamydia (69629) T-Syphilis Test (RPR) (52841-32440) T-TSH (10272-53664) Hemoccult Guaiac-1 spec.(in office) (82270)  Problem # 2:  OTHER SCREENING BREAST EXAMINATION (ICD-V76.19) self breast exam placcard given mammogram scheduled Orders: Mammogram (Screening) (Mammo)  Problem # 3:  HYPERLIPIDEMIA (ICD-272.4) will check labs today Her updated medication list for this problem includes:    Simvastatin 20 Mg Tabs (Simvastatin) ..... One tablet by mouth nightly for cholesterol  Orders: T-Lipid Profile (40347-42595) T-Comprehensive Metabolic Panel (63875-64332) T-CBC w/Diff (95188-41660) Rapid HIV  (63016)  Problem # 4:  CONSTIPATION (ICD-564.00) advised high fiber diet Her updated medication list for this problem includes:    Senokot S 8.6-50 Mg Tabs (Sennosides-docusate sodium) ..... One tablet by mouth two times a day for bowels  Problem # 5:  NEED PROPHYLACTIC VACCINATION&INOCULATION FLU (ICD-V04.81) given today  Complete Medication List: 1)  Simvastatin 20 Mg Tabs (Simvastatin) .... One tablet by mouth nightly for cholesterol 2)  Senokot S 8.6-50 Mg Tabs (Sennosides-docusate sodium) .... One tablet by mouth two times a day  for bowels  Other Orders: Flu Vaccine 10yrs + (01093) Admin 1st Vaccine (23557) Vision Screening (32202)  Lipid Assessment/Plan:      Based on NCEP/ATP III, the patient's risk factor category is "0-1 risk factors".  The patient's lipid goals are as follows: Total cholesterol goal is 200; LDL cholesterol goal is 160; HDL cholesterol goal is 40; Triglyceride goal is 150.    Patient Instructions: 1)  Constipation - Take senokot over the counter twice per day to help keep your stools soft 2)  Your labs will be checked today and you will be notified of the results 3)  You have received the flu vaccine today. 4)  Follow up as needed   Orders Added: 1)  Flu Vaccine 26yrs + [90658] 2)  Admin 1st Vaccine [90471] 3)  Est. Patient age 60-64 [4] 4)  KOH/ WET  Mount [[address]] 5)  Pap Smear, Thin Prep ( Collection of) [Q0091] 6)  UA Dipstick w/o Micro (manual) [81002] 7)  T- GC Chlamydia [60454] 8)  T-Lipid Profile [80061-22930] 9)  T-Comprehensive Metabolic Panel [80053-22900] 10)  T-CBC w/Diff [09811-91478] 11)  Rapid HIV  [92370] 12)  T-Syphilis Test (RPR) [29562-13086] 13)  T-TSH [57846-96295] 14)  Hemoccult Guaiac-1 spec.(in office) [82270] 15)  Vision Screening [99173] 16)  Mammogram (Screening) [Mammo]   Immunizations Administered:  Influenza Vaccine # 1:    Vaccine Type: Fluvax 3+    Site: left deltoid    Mfr: GlaxoSmithKline    Dose: 0.5 ml    Route: IM    Given by: Hale Drone CMA    Exp. Date: 09/20/2010    Lot #: [id number]    VIS given: 10/15/09 version given March 19, 2010.  Flu Vaccine Consent Questions:    Do you have a history of severe allergic reactions to this vaccine? no    Any prior history of allergic reactions to egg and/or gelatin? no    Do you have a sensitivity to the preservative Thimersol? no    Do you have a past history of Guillan-Barre Syndrome? no    Do you currently have an acute febrile illness? no    Have you ever had a severe reaction to  latex? no    Vaccine information given and explained to patient? yes    Are you currently pregnant? no   Immunizations Administered:  Influenza Vaccine # 1:    Vaccine Type: Fluvax 3+    Site: left deltoid    Mfr: GlaxoSmithKline    Dose: 0.5 ml    Route: IM    Given by: Hale Drone CMA    Exp. Date: 09/20/2010    Lot #: [id number]    VIS given: 10/15/09 version given March 19, 2010.   Laboratory Results   Urine Tests  Date/Time Received: March 19, 2010 9:39 AM   Routine Urinalysis   Color: lt. yellow Appearance: Clear Glucose: negative   (Normal Range: Negative) Bilirubin: negative   (Normal Range: Negative) Ketone: negative   (Normal Range: Negative) Spec. Gravity: >=1.030   (Normal Range: 1.003-1.035) Blood: small   (Normal Range: Negative) pH: 5.5   (Normal Range: 5.0-8.0) Protein: negative   (Normal Range: Negative) Urobilinogen: 0.2   (Normal Range: 0-1) Nitrite: negative   (Normal Range: Negative) Leukocyte Esterace: negative   (Normal Range: Negative)    Date/Time Received: March 19, 2010 10:36 AM   Wet Mount Source: vaginal WBC/hpf: 1-5 Bacteria/hpf: rare Clue cells/hpf: none Yeast/hpf: none Wet Mount KOH: Negative Trichomonas/hpf: none  Other Tests  Rapid HIV: negative  Stool - Occult Blood Hemmoccult #1: negative Date: 03/19/2010

## 2010-04-24 NOTE — Progress Notes (Signed)
Summary: Lab results  Phone Note Outgoing Call   Summary of Call: Notif pt -   Cholesterol is high . Advised pt to check pravachol 20mg  - TWO tablets by mouth nightly for  (Rx in basket) She should also be eating a low fat, low cholesterol diet. Initial call taken by: Lehman Prom FNP,  March 20, 2010 2:19 PM  Follow-up for Phone Call        per Arna Medici pt informed of above information.  Rx faxed to [facility]. Follow-up by: Levon Hedger,  March 25, 2010 10:01 AM    New/Updated Medications: PRAVASTATIN SODIUM 20 MG TABS (PRAVASTATIN SODIUM) 2 tablets by mouth nightly for cholesterol Prescriptions: PRAVASTATIN SODIUM 20 MG TABS (PRAVASTATIN SODIUM) 2 tablets by mouth nightly for cholesterol  #60 x 5   Entered and Authorized by:   Lehman Prom FNP   Signed by:   Lehman Prom FNP on 03/20/2010   Method used:   Printed then faxed to ...       [facility] [address] (retail)       [phone]       [place], [place]  [address]       Ph: [phone]       Fax: [phone]   RxID:   [id number]

## 2010-04-24 NOTE — Letter (Signed)
Summary: Handout Printed  Printed Handout:  - Diet - High-Fiber 

## 2010-04-24 NOTE — Letter (Signed)
Summary: DEPRESSION SCREENING  DEPRESSION SCREENING   Imported By: Arta Bruce 03/25/2010 14:40:46  _____________________________________________________________________  External Attachment:    Type:   Image     Comment:   External Document

## 2010-04-26 ENCOUNTER — Encounter: Payer: Self-pay | Admitting: Internal Medicine

## 2010-04-28 ENCOUNTER — Other Ambulatory Visit: Payer: Self-pay | Admitting: Internal Medicine

## 2010-04-28 DIAGNOSIS — R928 Other abnormal and inconclusive findings on diagnostic imaging of breast: Secondary | ICD-10-CM

## 2010-05-02 ENCOUNTER — Encounter (INDEPENDENT_AMBULATORY_CARE_PROVIDER_SITE_OTHER): Payer: Self-pay | Admitting: Internal Medicine

## 2010-05-05 ENCOUNTER — Ambulatory Visit
Admission: RE | Admit: 2010-05-05 | Discharge: 2010-05-05 | Disposition: A | Discharge status: Home / Self Care | Payer: Self-pay | Source: Ambulatory Visit | Attending: Internal Medicine | Admitting: Internal Medicine

## 2010-05-05 DIAGNOSIS — R928 Other abnormal and inconclusive findings on diagnostic imaging of breast: Secondary | ICD-10-CM

## 2010-08-08 NOTE — Discharge Summary (Signed)
NAME:  Natalie Martinez, Natalie Martinez                       ACCOUNT NO.:  [id number]   MEDICAL RECORD NO.:  [id number]                   PATIENT TYPE:  INP   LOCATION:  9305                                 FACILITY:  [facility]   PHYSICIAN:  Juan H. Lily Peer, M.D.             DATE OF BIRTH:  June 02, 1965   DATE OF ADMISSION:  10/23/2003  DATE OF DISCHARGE:  10/25/2003                                 DISCHARGE SUMMARY   HISTORY:  The patient is a 45 year old, gravida 4, para 3, AB 1 who on  August 2 underwent a transvaginal hysterectomy secondary to recurrent  cervical dysplasia and also for request for an elective permanent  sterilization. Blood loss from procedure was less than 100 mL and IV fluids  were 1600 mL of lactated Ringer's. She received a gram Cefotan for  prophylaxis and had PSA stockings.  On postoperative day #1, she was started  on a clear liquid diet, her hemoglobin was 13.6, her vital signs were stable  and she was afebrile.  Her abdomen was soft and had bowel sounds. Her Foley  catheter had been removed, her PCA pump was removed as well and she was  started on oral Percocet and was advanced to a regular diet that evening.  This morning on August 4, she was afebrile, she passed gas, her abdomen was  soft, nontender, her lungs were clear to auscultation. She had positive  flatus and had not had a BM as of yet but tolerated a full regular diet and  was ready to be discharged home.  Her pathology report is pending at the  time of this dictation and she was discharged home with Lortab 7.5/500 to  take 1 p.o. 4-6h p.r.n. pain and she can interchange that with Motrin 800 mg  t.i.d. p.r.n. and she will be seen in the office in two weeks for her postop  visit.                                               Juan H. Lily Peer, M.D.    JHF/MEDQ  D:  10/25/2003  T:  10/26/2003  Job:  [id number]

## 2010-08-08 NOTE — Op Note (Signed)
   NAME:  Natalie Martinez, Natalie Martinez                       ACCOUNT NO.:  [id number]   MEDICAL RECORD NO.:  [id number]                   PATIENT TYPE:  AMB   LOCATION:  SDC                                  [facility]ILITY:  [facility]   PHYSICIAN:  Premont B. Earlene Plater, M.D.               DATE OF BIRTH:  February 25, 1966   DATE OF PROCEDURE:  11/14/2001  DATE OF DISCHARGE:                                 OPERATIVE REPORT   PREOPERATIVE DIAGNOSIS:  Six week missed abortion.   POSTOPERATIVE DIAGNOSIS:  Six week missed abortion.   PROCEDURE:  Suction curettage.   SURGEON:  Chester Holstein. Earlene Plater, M.D.   ANESTHESIA:  MAC and 10 cc 2% lidocaine paracervical block.   SPECIMENS:  Uterine contents.   ESTIMATED BLOOD LOSS:  Less than 50 cc.   COMPLICATIONS:  None.   INDICATIONS FOR PROCEDURE:  The patient with six-week missed AB.  Requests  operative management.   PROCEDURE:  The patient was taken to the operating room and MAC anesthesia  obtained.  She is placed in the ski position and prepped and draped in the  standard fashion.  Bladder was emptied with a red rubber catheter.  The  patient examined under anesthesia and found to have an eight-week size  anteverted uterus, no adnexal masses palpable.   The speculum was inserted and the anterior lip of the cervix grasped with a  toothed tenaculum.  The paracervical block placed in standard fashion.   The cervix was easily dilated to a 21.  The #7 curved suction cannula was  inserted with suction on.  Products returned in two passes.  The uterus was  then gently curetted and grainy texture noted throughout.  The suction  cannula was passed once again and no additional tissue returned.  Therefore,  the procedure was terminated.   The instruments were removed from the vagina.  The cervix was hemostatic.  The patient was taken to the recovery room awake and alert in stable  condition.  The patient's blood type was O-positive.        Gerri Spore B. Earlene Plater, M.D.    WBD/MEDQ  D:  11/14/2001  T:  11/14/2001  Job:  [id number]

## 2010-08-08 NOTE — Discharge Summary (Signed)
   NAME:  Natalie Martinez, Natalie Martinez                       ACCOUNT NO.:  [id number]   MEDICAL RECORD NO.:  [id number]                   PATIENT TYPE:  INP   LOCATION:  9145                                 [facility]ACILITY:  [facility]   PHYSICIAN:  Timothy P. Fontaine, M.D.           DATE OF BIRTH:  1965/05/02   DATE OF ADMISSION:  01/17/2003  DATE OF DISCHARGE:  01/19/2003                                 DISCHARGE SUMMARY   DISCHARGE DIAGNOSIS:  Intrauterine pregnancy at term in labor.   PROCEDURE:  Spontaneous vaginal delivery with a left labial laceration with  repair.   HISTORY OF PRESENT ILLNESS:  A 45 year old gravida 5, AB 2, para 2 at 39-2/7  weeks with contractions, dilated 1-2 cm, 90% effaced, and minus 2 at  admission and contracting every 2 to 4 minutes.   LABORATORY DATA:  Blood type is 0 positive, antibody negative, serology  nonreactive, rubella titer negative, hepatitis negative, HIV negative, and  group B strep negative.  She did have an amniocentesis that did show a  normal 46,XX.   HOSPITAL COURSE AND TREATMENT:  The patient presented on October 27th in  labor.  She was admitted.  She progressed to complete.  She delivered a  viable female with Apgars of 5 and 9 with a birth weight of 8 pounds 2  ounces.  She had requested a postpartum tubal ligation that was cancelled  due to scheduling problems and she will have that at her six-week checkup.   DISCHARGE LABORATORY DATA:  Her white count was 11.7, hemoglobin 10.4,  hematocrit 30.4, platelets 148.   DISPOSITION:  She was discharged to home, instructed to follow up in six  weeks or as needed.  Will schedule a postpartum tubal ligation.  Continue  prenatal vitamins and irons, Motrin as needed for pain, and [facility] discharge  booklet.     Davonna Belling. Young, N.P.                      Timothy P. Audie Box, M.D.    [facility]  D:  02/05/2003  T:  02/05/2003  Job:  [id number]

## 2010-08-08 NOTE — H&P (Signed)
NAME:  Natalie Martinez, Natalie Martinez                       ACCOUNT NO.:  [id number]   MEDICAL RECORD NO.:  [id number]                   PATIENT TYPE:  INP   LOCATION:  9166                                 [facility]ACILITY:  [facility]   PHYSICIAN:  Juan H. Lily Peer, M.D.             DATE OF BIRTH:  1965/11/25   DATE OF ADMISSION:  01/17/2003  DATE OF DISCHARGE:                                HISTORY & PHYSICAL   CHIEF COMPLAINT:  1. Contractions.  2. Requests for elective permanent sterilization postpartum.   HISTORY:  The patient is a 45 year old gravida 5 para 2 AB 2 at 51 and two-  sevenths weeks gestation who presented to [facility] early this  morning complaining of contractions that started approximately at 2300 on  January 16, 2003.  She was found to be dilated 1-2 cm, 90% effaced, -2  station, with significant labor pains and was admitted.  Her contractions  were found to be every two to four minutes apart with a reassuring fetal  heart rate tracing.   PRENATAL COURSE:  Prenatal course significant for the fact that she is  rubella nonimmune.  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  She also due to her advanced maternal age had genetic amniocentesis with a  normal 46,XX chromosomes being determined.  Otherwise, the remainder of her  prenatal course was uneventful and her group B strep was negative.   PAST MEDICAL HISTORY:  She denies any allergies.  She has had a normal  spontaneous vaginal delivery in 1993 and 1996 and she had an elective  abortion in 2000 and spontaneous abortion in 2003.  She denies any current  medical problems and denies any allergies.   REVIEW OF SYSTEMS:  See [place] form.   PHYSICAL EXAMINATION:  GENERAL:  Well-developed, well-nourished female  complaining of labor pains.  HEENT:  Unremarkable.  NECK:  Supple, trachea midline.  No carotid bruits, no thyromegaly.  LUNGS:  Clear  to auscultation without rhonchi or wheezes.  HEART:  Regular rate and rhythm, no murmur or gallops.  BREAST:  Exam not done.  ABDOMEN:  Gravid uterus, vertex presentation by [facility] maneuver, positive  fetal heart tones.  PELVIC:  Cervix 1-2 cm, 90% effaced, -2 station.  EXTREMITIES:  DTRs 1+, negative clonus, trace edema.   PRENATAL LABORATORY DATA:  O positive blood type, negative antibody screen.  VDRL nonreactive.  Hepatitis B surface antigen and HIV were both negative.  Rubella nonimmune.  Diabetes screen was normal.  Maternal serum alpha-  fetoprotein was normal.  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  The  patient was GBS negative.   ASSESSMENT:  A 45 year old gravida 5 para 2 AB 2 at 49 and two-sevenths week  gestation with intense labor pains.  Cervix was 1-2 cm dilated.  She  underwent artificial rupture of membranes, clear amniotic fluid, 90%  effaced, and -  2 station.  IUPC and fetal scalp electrode were placed.  Will  go ahead and proceed with getting an epidural for relief due to the  intensity of discomfort that the patient is suffering from her contractions  and then augment accordingly with Pitocin.  The patient had requested in the  office as well as today to have a postpartum tubal sterilization.  The  risks, benefits, pros and cons and failure rate were discussed.  All efforts  will be made to comply with her wishes although she is fully aware that if  it becomes a scheduling issue in timing that we may need to wait until six  weeks postpartum and schedule as an outpatient and do it laparoscopically.  All of the above was explained to the patient in Spanish and will follow  accordingly.   PLAN:  As per assessment above.                                               Juan H. Lily Peer, M.D.    JHF/MEDQ  D:  01/17/2003  T:  01/17/2003  Job:  [id number]

## 2010-08-08 NOTE — H&P (Signed)
NAME:  Natalie Martinez, Natalie Martinez                       ACCOUNT NO.:  [id number]   MEDICAL RECORD NO.:  [id number]                   PATIENT TYPE:  AMB   LOCATION:  N[facility]C                                 FACILITY:  [facility]   PHYSICIAN:  Juan H. Lily Peer, M.D.             DATE OF BIRTH:  [birth year]   DATE OF ADMISSION:  03/13/2003  DATE OF DISCHARGE:                                HISTORY & PHYSICAL   CHIEF COMPLAINT:  Request for elective permanent sterilization.   HISTORY:  The patient is a 46 year old gravida 4, para 3, AB 1, who was seen  in the [facility] office on December 16 for her six-week  postpartum visit, and she had requested shortly after delivery that she  wanted to proceed after six weeks postpartum with an elective sterilization  procedure.  She had been provided with literature information on the risks,  benefits, and pros and cons and she was counseled in the office in Spanish,  and she knows that this procedure is permanent and she is willing to proceed  with it.   PAST MEDICAL HISTORY:  The patient has had three normal spontaneous vaginal  deliveries and one D&C.  She is not allergic to any medication.  Negative  family history.  She has been healthy otherwise.   PHYSICAL EXAMINATION:  VITAL SIGNS:  The patient is 159 pounds, 5 feet 2  inches tall, blood pressure 102/70.  HEENT:  Unremarkable.  NECK:  Supple, trachea midline, no carotid bruits, no thyromegaly.  CHEST:  Lungs were clear to auscultation without rhonchi or wheezes.  CARDIAC:  Regular rate and rhythm, no murmurs or gallops.  BREASTS:  Both breasts examined and in a supine position.  They were  symmetrical in appearance, no skin discoloration or nipple inversion, no  palpable mass or tenderness.  No supraclavicular or axillary  lymphadenopathy.  ABDOMEN:  Soft, nontender, without rebound or guarding.  PELVIC:  Bartholin's, urethra, and Skene's glands within normal limits.  Vagina and cervix:  No  lesions or discharge.  Uterus slightly anteverted,  normal size, shape, and consistency.  Adnexa without palpable mass or  tenderness.  RECTAL:  Deferred.   ASSESSMENT:  A 45 year old gravida 4, para 3, AB 1, with a request for  elective permanent sterilization.  She is seen today for a six-week  postpartum visit that was unremarkable.  The patient was provided with  literature information from the [employer] of OB/GYN outlining  laparoscopic tubal sterilization procedure, risks, benefits, pros and cons,  failure rate were discussed.  Risk of trauma to internal organs from the  laparoscopic instrument were discussed, requiring open laparotomy to correct  any internal defect as a result of the laparoscopic procedure.  Also, in the  event of any technical difficulty, her abdomen may need to be opened up to  complete the sterilization procedures, which would then require for her to  have additional hospitalization days, and in  the even of uncontrollable  hemorrhage if she were to need blood and blood products, she is fully aware  of the risks for anaphylactic reaction, hepatitis, and AIDS.  Also there is  a risk for deep venous thrombosis, pulmonary embolism, and even death and  infections and although we will offer her prophylaxis antibiotics.  All of  the above was discussed with the patient in Spanish, and she knows that this  is a permanent form of sterilization and will not be able to have any more  children.  All questions were answered and were answered accordingly.   PLAN:  The patient is scheduled for a laparoscopic tubal sterilization  procedure Tuesday, March 13, 2003, at 1 p.m. at [facility].  Please have history and physical available.                                               Juan H. Lily Peer, M.D.    JHF/MEDQ  D:  03/08/2003  T:  03/08/2003  Job:  [id number]

## 2010-08-08 NOTE — H&P (Signed)
NAME:  Natalie Martinez, Natalie Martinez                       ACCOUNT NO.:  [id number]   MEDICAL RECORD NO.:  [id number]                   PATIENT TYPE:  OBV   LOCATION:  NA                                   [facility]   PHYSICIAN:  Juan H. Lily Peer, M.D.             DATE OF BIRTH:  12/18/1965   DATE OF ADMISSION:  10/23/2003  DATE OF DISCHARGE:                                HISTORY & PHYSICAL   CHIEF COMPLAINT:  1. Recurrent cervical dysplasia.  2. Request for elective permanent sterilization.   HISTORY:  The patient is a 45 year old, gravida 4, para 3, AB 1, who has had  a history of recurrence of abnormal Pap smear.  A review of her record  indicated that in August of 2003 she had CIN-3 diagnosed in a previously  gynecological practice, and subsequently in January of 2004 underwent a LEEP  cone which demonstrated CIN-2, and her margins were negative.  In our office  in April of 2004, she had atypical squamous cells of undetermined  significance in her Pap smear, and returned back in December for her annual  exam, and had CIN-1 once again.  So on June 25, 2002, she underwent a  detailed colposcopic examination, whereby no external genital lesions were  noted.  The endocervical speculum had been introduced into the endocervical  canal, and there were acetowhite lesions noted inside the canal.  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  The cervical  __________ was demonstrated to be benign, and the cervical tissue with  squamous metaplasia, but no dysplasia identified.  Last year, the patient  was to undergo a laparoscopic tubal sterilization procedure and decided to  wait for personal reasons, and now she has decided, because of her current  dysplasia, and for request for elective permanent sterilization, to  proceed  with a transvaginal hysterectomy.  She has had 2 pregnancies from a prior  marriage, and 1 from this marriage, and she is content, and does not want to  have any more children.   PAST MEDICAL HISTORY:  She has had 3 normal spontaneous vaginal deliveries  and 1 D&C.  She has also had 2 LEEP cervical conizations.  She has been  healthy otherwise.   ALLERGIES:  She is not allergic to any medication.   FAMILY HISTORY:  Negative.   PHYSICAL EXAMINATION:  VITAL SIGNS:  The patient weights 159 pounds, height  5 feet 2 inches tall, blood pressure 102/70.  HEENT:  Unremarkable.  NECK:  Supple.  Trachea midline.  No carotid bruits.  No thyromegaly.  LUNGS:  Clear to auscultation without rhonchi or wheezes.  HEART:  Regular rate and rhythm without any murmurs or gallops.  BREAST EXAM:  Not done.  ABDOMEN:  Soft, nontender, without rebound or  guarding.  PELVIC:  Bartholin, urethral, and Skene glands within normal limits.  Vaginal and cervix with no lesions or discharge.  Uterus anteverted, normal  size, shape, and consistency.  Adnexa without any palpable masses or  tenderness.  RECTAL:  Unremarkable.   ASSESSMENT:  A 45 year old, gravida 4, para 3, AB 1, with recurrent cervical  dysplasia, also requesting elective permanent sterilization.  Risks,  benefits, and pros and cons of both procedures were discussed with the  patient.  The patient is fully aware that she will not be able to have any  more children.  Other risks include infection, although she will receive  prophylactic antibiotic.  Also the risk for deep vein thrombosis and  subsequent pulmonary embolism, whereby she will have PSA stockings for  prophylaxis.  Also, in the event of uncontrollable hemorrhage, she would  need a blood transfusion.  She is fully aware of the potential risk of  anaphylactic reaction, hepatitis, or AIDS.  Also, in the event of any  technical difficulty, the transvaginal approach may need to be  aborted, and  completion of the operation through an abdominal approach.  Other risks are  trauma to nearby structures such as the bladder, the rectum, nerves, and  blood vessels were discussed, as well.  She previously was provided with  literature information from the [employer] of OB/GYN, and all the  above was discussed in Spanish, and her husband had been present during  counseling.  All questions were answered, and will follow accordingly.   PLAN:  The patient is scheduled for transvaginal hysterectomy with ovarian  conservation on Tuesday, October 23, 2003, at 7:30 a.m. at [facility].                                               [place] H. Lily Peer, M.D.    JHF/MEDQ  D:  10/22/2003  T:  10/23/2003  Job:  [id number]

## 2010-08-08 NOTE — Op Note (Signed)
NAME:  Natalie Martinez, Natalie Martinez                       ACCOUNT NO.:  [id number]   MEDICAL RECORD NO.:  [id number]                   PATIENT TYPE:  OBV   LOCATION:  9305                                 [facility]ACILITY:  [facility]   PHYSICIAN:  Juan H. Lily Peer, M.D.             DATE OF BIRTH:  [birth year]   DATE OF PROCEDURE:  10/23/2003  DATE OF DISCHARGE:                                 OPERATIVE REPORT   PREOPERATIVE DIAGNOSES:  1. Recurrent cervical dysplasia.  2. Request elective permanent sterilization.   POSTOPERATIVE DIAGNOSES:  1. Recurrent cervical dysplasia.  2. Request elective permanent sterilization.   OPERATION PERFORMED:  Transvaginal hysterectomy.   SURGEON:  Juan H. Lily Peer, M.D.   ASSISTANT:  Rande Brunt. Eda Paschal, M.D.   ANESTHESIA:  General endotracheal.   INDICATIONS FOR PROCEDURE:  The patient is a 45 year old gravida 4, para 3,  ab 1, who has a history of recurrent cervical dysplasia and also requesting  elective permanent sterilization.   DESCRIPTION OF PROCEDURE:  After the patient was adequately counseled, she  was taken to the operating room where she underwent successful general  endotracheal anesthesia.  Vagina and perineum were prepped and draped in the  usual sterile fashion.  A Foley catheter was inserted in effort to monitor  urinary output.  A short weighted bell speculum was placed into the vaginal  vault and two Lahey power clamps were placed on the anterior and posterior  cervical lip respectively for traction.  1% Xylocaine with 1:100,000  epinephrine was infiltrated in a circumferential fashion into the cervical  vaginal fold for a total of 10 mL.  With Deaver retractors in addition for  exposure, the cervical vaginal fold was brought into view in a  circumferential fashion.  Incision was made in the cervical vaginal fold.  Posterior colpotomy was established.  A short bell weighted speculum was  exchanged for a long weighted bill speculum.  Both  uterosacral ligaments  were clamped, cut and suture ligated with 0 Vicryl suture intact.  An  anterior colpotomy was made by entering the peritoneal cavity cautiously.  The remainder of the cardial and broad ligaments were serially clamped, cut  and suture ligated with a single Vicryl suture to the level of both triple  pedicles which were clamped, cut and left on the clamp until the uterus and  cervix was removed.  Both pedicles were tied first with a transfixation  stitch followed by a second stitch to each pedicle.  This was accomplished  on the other side as well.  The vaginal cuff was whipped with 0 Vicryl  suture and then the remainder of the vaginal cuff was closed with  interrupted sutures of 0 Vicryl suture.  The vaginal area was irrigated with  normal saline solution.  The patient was extubated and transferred to the  recovery room with stable vital signs.  Blood loss was 100 mL.  IV fluids  1600 mL of  lactated Ringers.  Urine output 375 mL.  The patient did receive  a gram of Cefotan for prophylaxis and had PSA stockings as well.                                               Juan H. Lily Peer, M.D.    JHF/MEDQ  D:  10/23/2003  T:  10/23/2003  Job:  [id number]

## 2010-08-08 NOTE — Group Therapy Note (Signed)
NAME:  Natalie Martinez, Natalie Martinez NO.:  [id number]   MEDICAL RECORD NO.:  [id number]          PATIENT TYPE:  WOC   LOCATION:  [facility]                   FACILITY:  [facility]   PHYSICIAN:  Argentina Donovan, MD        DATE OF BIRTH:  11/09/1965   DATE OF SERVICE:  10/14/2004                                    CLINIC NOTE   REASON FOR VISIT:  The patient is a 45 year old Hispanic female gravida 4  para 3-0-1-3 who underwent one year ago a vaginal hysterectomy for cervical  dysplasia and the pathology showed no residual dysplasia and she is in today  for Pap smear. I have told her that if we do three Pap smears, one each year  for 3 years, she will not need anymore, but she still does need the ovaries  checked. She has also been complaining of some vaginal symptoms, mainly  itching, so wet prep was done as well as a Pap smear.   EXAMINATION:  On examination, the abdomen is soft, flat, nontender. No  masses, no organomegaly. External genitalia is normal. BUS is within normal  limits. The vagina is clean and well rugated, and on bimanual and pelvic  examination the ovaries could not be well palpated because of the habitus of  the patient. However, there seemed to be no masses in the pelvis.       PR/MEDQ  D:  10/14/2004  T:  10/15/2004  Job:  [id number]

## 2010-08-08 NOTE — H&P (Signed)
NAME:  Natalie Martinez, Natalie Martinez                       ACCOUNT NO.:  [id number]   MEDICAL RECORD NO.:  [id number]                   PATIENT TYPE:  AMB   LOCATION:  N[facility]                                 FACILITY:  [facility]   PHYSICIAN:  Juan H. Lily Peer, M.D.             DATE OF BIRTH:  [birth year]   DATE OF ADMISSION:  DATE OF DISCHARGE:                                HISTORY & PHYSICAL   DATE OF ADMISSION:  The patient is scheduled for surgery at [facility] on Tuesday, March 13, 2003 at 1 p.m.   CHIEF COMPLAINT:  Elective sterilization.   HISTORY:  The patient is a 45 year old gravida 4 para 3 AB 1 who was seen in  the office on March 08, 2003 at [facility] for her six week  postpartum visit.  She has been status post normal spontaneous vaginal  delivery and had requested to proceed with elective permanent sterilization  for which literature and information had been provided from the [employer] of OB/GYN.  She is scheduled for a laparoscopic tubal sterilization  Tuesday, March 13, 2003 at [facility].  Her six weeks  postpartum examination is otherwise unremarkable and she had been counseled  as to risks, benefits, pros and cons of the operation and she is fully aware  that her sterilization is permanent.   PAST MEDICAL HISTORY:  The patient has had three normal spontaneous vaginal  deliveries, one D&C.  She denies any allergies.  She has otherwise been in  excellent health.   PHYSICAL EXAMINATION:  VITAL SIGNS:  Blood pressure was 102/70, height 5  feet 2 inches tall, weight 159 pounds.  HEENT:  Unremarkable.  NECK:  Supple.  Trachea midline.  No carotid bruits, no thyromegaly.  LUNGS:  Clear to auscultation without rhonchi or wheezes.  HEART:  Regular rate and rhythm, no murmurs or gallops.  BREAST:  Both breasts are symmetrical in appearance.  No skin discoloration  or nipple inversion.  No palpable mass or tenderness.  NODES:   No cervical, clavicular, or axillary lymphadenopathy.  ABDOMEN:  Soft, nontender, without rebound or guarding.  PELVIC:  Bartholin, urethra, Skene glands:  Within normal limits.  Vagina  and cervix:  No lesions or discharge.  Uterus:  Anteverted; normal size,  shape, and consistency.  Adnexa:  Without mass or tenderness.  RECTAL:  Not done.   ASSESSMENT:  A 45 year old gravida 4 para 3 AB 1 requests for elective  permanent sterilization.  Risks, benefits, pros, and cons were previously  discussed as well as literature information from the [employer] of  OB/GYN had been provided.  The patient is fully aware that this is a  permanent sterilization.  She will not be able to have any more children.  Potential risks from the operation discussed were infection; trauma to  internal organs from laparoscopic instruments requiring emergency open  laparotomy and correction of intraabdominal trauma  and requiring additional  hospitalization days, although in the event of any technical difficulty in  gaining access to the tubes a mini laparotomy may need to be done in order  to gain access to the tubes and complete the operation.  Also, the risk of  hemorrhage in the event of uncontrollable hemorrhage and the patient were to  need a blood transfusion she is fully aware of it potential risks such as  anaphylactic reaction, hepatitis, and AIDS from donor blood and blood  products.  All the above was discussed with the patient.  All questions were  answered and will follow accordingly.   PLAN:  The patient is scheduled for a laparoscopic tubal sterilization  procedure Tuesday, December 21 at [facility].                                               Juan H. Lily Peer, M.D.    JHF/MEDQ  D:  03/12/2003  T:  03/12/2003  Job:  [id number]

## 2011-02-16 ENCOUNTER — Emergency Department (HOSPITAL_COMMUNITY)
Admission: EM | Admit: 2011-02-16 | Discharge: 2011-02-17 | Disposition: A | Discharge status: Home / Self Care | Payer: BC Managed Care – PPO | Attending: Emergency Medicine | Admitting: Emergency Medicine

## 2011-02-16 ENCOUNTER — Encounter (HOSPITAL_COMMUNITY): Payer: Self-pay | Admitting: *Deleted

## 2011-02-16 DIAGNOSIS — R109 Unspecified abdominal pain: Secondary | ICD-10-CM | POA: Insufficient documentation

## 2011-02-16 NOTE — ED Notes (Signed)
The pt has had rt flank pain for 2 days.  No nv or diarrhea

## 2011-02-17 ENCOUNTER — Emergency Department (HOSPITAL_COMMUNITY): Payer: BC Managed Care – PPO

## 2011-02-17 LAB — CBC
HCT: 39 % (ref 36.0–46.0)
Hemoglobin: 13.2 g/dL (ref 12.0–15.0)
MCH: 32.5 pg (ref 26.0–34.0)
MCHC: 33.8 g/dL (ref 30.0–36.0)
MCV: 96.1 fL (ref 78.0–100.0)
Platelets: 217 10*3/uL (ref 150–400)
RBC: 4.06 MIL/uL (ref 3.87–5.11)
RDW: 12.6 % (ref 11.5–15.5)
WBC: 7.7 10*3/uL (ref 4.0–10.5)

## 2011-02-17 LAB — URINALYSIS, ROUTINE W REFLEX MICROSCOPIC
Bilirubin Urine: NEGATIVE
Glucose, UA: NEGATIVE mg/dL
Ketones, ur: NEGATIVE mg/dL
Leukocytes, UA: NEGATIVE
Nitrite: NEGATIVE
Protein, ur: NEGATIVE mg/dL
Specific Gravity, Urine: 1.02 (ref 1.005–1.030)
Urobilinogen, UA: 0.2 mg/dL (ref 0.0–1.0)
pH: 6.5 (ref 5.0–8.0)

## 2011-02-17 LAB — BASIC METABOLIC PANEL
BUN: 11 mg/dL (ref 6–23)
CO2: 29 mEq/L (ref 19–32)
Calcium: 9.5 mg/dL (ref 8.4–10.5)
Chloride: 104 mEq/L (ref 96–112)
Creatinine, Ser: 0.58 mg/dL (ref 0.50–1.10)
GFR calc Af Amer: >90 mL/min (ref >90)
GFR calc non Af Amer: >90 mL/min (ref >90)
Glucose, Bld: 95 mg/dL (ref 70–99)
Potassium: 4.6 mEq/L (ref 3.5–5.1)
Sodium: 140 mEq/L (ref 135–145)

## 2011-02-17 LAB — URINE MICROSCOPIC-ADD ON

## 2011-02-17 LAB — PREGNANCY, URINE: Preg Test, Ur: NEGATIVE

## 2011-02-17 MED ORDER — TRAMADOL HCL 50 MG PO TABS: 50.0000 mg | ORAL_TABLET | Freq: Four times a day (QID) | ORAL | Status: AC | PRN

## 2011-02-17 MED ORDER — KETOROLAC TROMETHAMINE 15 MG/ML IJ SOLN
15.0000 mg | INTRAMUSCULAR | Status: DC
  Filled 2011-02-17: qty 1

## 2011-02-17 MED ORDER — IOHEXOL 300 MG/ML  SOLN
80.0000 mL | Freq: Once | INTRAMUSCULAR | Status: AC | PRN
  Administered 2011-02-17: 80 mL via INTRAVENOUS

## 2011-02-17 MED ORDER — KETOROLAC TROMETHAMINE 30 MG/ML IJ SOLN
INTRAMUSCULAR | Status: AC
  Administered 2011-02-17: 15 mg
  Filled 2011-02-17: qty 1

## 2011-02-17 MED ORDER — ONDANSETRON HCL 4 MG/2ML IJ SOLN
4.0000 mg | Freq: Once | INTRAMUSCULAR | Status: AC
  Administered 2011-02-17: 4 mg via INTRAVENOUS
  Filled 2011-02-17: qty 2

## 2011-02-17 MED ORDER — SODIUM CHLORIDE 0.9 % IV BOLUS (SEPSIS)
1000.0000 mL | Freq: Once | INTRAVENOUS | Status: AC
  Administered 2011-02-17: 1000 mL via INTRAVENOUS

## 2011-02-17 MED ORDER — MORPHINE SULFATE 4 MG/ML IJ SOLN
4.0000 mg | Freq: Once | INTRAMUSCULAR | Status: AC
  Administered 2011-02-17: 4 mg via INTRAVENOUS
  Filled 2011-02-17: qty 1

## 2011-02-17 NOTE — ED Notes (Signed)
Back from CT

## 2011-02-17 NOTE — Discharge Instructions (Signed)
Abdominal Pain Abdominal pain can be caused by many things. Your caregiver decides the seriousness of your pain by an examination and possibly blood tests and X-rays. Many cases can be observed and treated at home. Most abdominal pain is not caused by a disease and will probably improve without treatment. However, in many cases, more time must pass before a clear cause of the pain can be found. Before that point, it may not be known if you need more testing, or if hospitalization or surgery is needed. HOME CARE INSTRUCTIONS   Do not take laxatives unless directed by your caregiver.   Take pain medicine only as directed by your caregiver.   Only take over-the-counter or prescription medicines for pain, discomfort, or fever as directed by your caregiver.   Try a clear liquid diet (broth, tea, or water) for as long as directed by your caregiver. Slowly move to a bland diet as tolerated.  SEEK IMMEDIATE MEDICAL CARE IF:   The pain does not go away.   You have a fever.   You keep throwing up (vomiting).   The pain is felt only in portions of the abdomen. Pain in the right side could possibly be appendicitis. In an adult, pain in the left lower portion of the abdomen could be colitis or diverticulitis.   You pass bloody or black tarry stools.  MAKE SURE YOU:   Understand these instructions.   Will watch your condition.   Will get help right away if you are not doing well or get worse.  Document Released: 12/17/2004 Document Revised: 11/19/2010 Document Reviewed: 10/26/2007 [facility] Patient Information 2012 [place], [place].Dolor abdominal (Abdominal Pain) El dolor abdominal o dolor en el estmago puede ser causado por muchos factores. El profesional que lo asiste decidir la gravedad de la causa dolor por medio de un examen fsico y Landscape architect pruebas de y Recruitment consultant. Muchos de estos casos pueden controlarse y tratarse en casa. La mayor parte de los dolores en la zona abdominal que  padecen los nios es funcional. Esto significa que no est originado en ninguna enfermedad y que probablemente mejorar sin tratamiento. INSTRUCCIONES PARA EL CUIDADO DOMICILIARIO  No tome ni administre laxantes a menos que se lo haya indicado el profesional que lo asiste.   Utilice los medicamentos de venta libre o de prescripcin para Chief Technology Officer, Environmental health practitioner o la [place], segn se lo indique el profesional que lo asiste.   Tome medicacin para el alivio del dolor slo si se lo ha indicado el profesional que lo asiste.   Consuma una dieta lquida: caldo, t o agua el tiempo que se lo indique su mdico. Luego podr gradualmente consumir una dieta blanda segn lo que tolere cada [place].  SOLICITE ATENCIN MDICA DE INMEDIATO SI:  El dolor persiste.   Tiene fiebre.   Presenta vmitos repetidas veces.   Siente el dolor slo en algunos sectores del abdomen (vientre). Si se localiza en la zona derecha, posiblemente podra tratarse de apendicitis. En un adulto, si se localiza en la regin inferior izquierda del abdomen, podra tratarse de colitis o diverticulitis.   Hay sangre en las heces (deposiciones de color rojo brillante o negro alquitranado).  EST SEGURO QUE:   Comprende las instrucciones para el alta mdica.   Controlar su enfermedad.   Solicitar atencin mdica de inmediato segn las indicaciones.  Document Released: 03/09/2005 Document Revised: 11/19/2010 [facility] Patient Information 2012 [place], [place].Dolor abdominal (Abdominal Pain) El dolor abdominal o dolor en el estmago puede ser causado  por muchos factores. El profesional que lo asiste decidir la gravedad de la causa dolor por medio de un examen fsico y Landscape architect pruebas de y Recruitment consultant. Muchos de estos casos pueden controlarse y tratarse en casa. La mayor parte de los dolores en la zona abdominal que padecen los nios es funcional. Esto significa que no est originado en ninguna enfermedad y que probablemente  mejorar sin tratamiento. INSTRUCCIONES PARA EL CUIDADO DOMICILIARIO  No tome ni administre laxantes a menos que se lo haya indicado el profesional que lo asiste.   Utilice los medicamentos de venta libre o de prescripcin para Chief Technology Officer, Environmental health practitioner o la [place], segn se lo indique el profesional que lo asiste.   Tome medicacin para el alivio del dolor slo si se lo ha indicado el profesional que lo asiste.   Consuma una dieta lquida: caldo, t o agua el tiempo que se lo indique su mdico. Luego podr gradualmente consumir una dieta blanda segn lo que tolere cada [place].  SOLICITE ATENCIN MDICA DE INMEDIATO SI:  El dolor persiste.   Tiene fiebre.   Presenta vmitos repetidas veces.   Siente el dolor slo en algunos sectores del abdomen (vientre). Si se localiza en la zona derecha, posiblemente podra tratarse de apendicitis. En un adulto, si se localiza en la regin inferior izquierda del abdomen, podra tratarse de colitis o diverticulitis.   Hay sangre en las heces (deposiciones de color rojo brillante o negro alquitranado).  EST SEGURO QUE:   Comprende las instrucciones para el alta mdica.   Controlar su enfermedad.   Solicitar atencin mdica de inmediato segn las indicaciones.  Document Released: 03/09/2005 Document Revised: 11/19/2010 [facility] Patient Information 2012 [place], [place].

## 2011-02-17 NOTE — ED Provider Notes (Signed)
History    45yf with abdominal pain. r flank to ruq. Gradual onset a couple days ago. Cant remember what was doing when started. Relatively constant. Crampy. No appreciable exacerbating or reliving factors. No fever or chlls. No n/v/d. No cp or sob. No rash. Denies trauma. No hx of similar symptoms. No urinary complaints. Denies hx of renal or biliary colic.  CSN: [id number] Arrival date & time: 02/16/2011  5:56 PM   First MD Initiated Contact with Patient 02/17/11 0047      Chief Complaint  Patient presents with  . Flank Pain    (Consider location/radiation/quality/duration/timing/severity/associated sxs/prior treatment) HPI  History reviewed. No pertinent past medical history.  History reviewed. No pertinent past surgical history.  History reviewed. No pertinent family history.  History  Substance Use Topics  . Smoking status: Never Smoker   . Smokeless tobacco: Not on file  . Alcohol Use: No    OB History    Grav Para Term Preterm Abortions TAB SAB Ect Mult Living                  Review of Systems   Review of symptoms negative unless otherwise noted in HPI.   Allergies  Review of patient's allergies indicates no known allergies.  Home Medications   Current Outpatient Rx  Name Route Sig Dispense Refill  . TRAMADOL HCL 50 MG PO TABS Oral Take 1 tablet (50 mg total) by mouth every 6 (six) hours as needed for pain. Maximum dose= 8 tablets per day 10 tablet 0    BP 103/53  Pulse 59  Temp(Src) 97.9 F (36.6 C) (Oral)  Resp 18  SpO2 99%  Physical Exam  Nursing note and vitals reviewed. Constitutional: She appears well-developed and well-nourished. No distress.  HENT:  Head: Normocephalic and atraumatic.  Eyes: Conjunctivae are normal. Right eye exhibits no discharge. Left eye exhibits no discharge.  Neck: Neck supple.  Cardiovascular: Normal rate, regular rhythm and normal heart sounds.  Exam reveals no gallop and no friction rub.   No murmur  heard. Pulmonary/Chest: Effort normal and breath sounds normal. No respiratory distress.  Abdominal: Soft. She exhibits no distension. There is no tenderness.  Genitourinary:       No cva tendrness  Musculoskeletal: She exhibits no edema and no tenderness.  Neurological: She is alert.  Skin: Skin is warm and dry. She is not diaphoretic.  Psychiatric: She has a normal mood and affect. Her behavior is normal. Thought content normal.    ED Course  Procedures (including critical care time)  Labs Reviewed  URINALYSIS, ROUTINE W REFLEX MICROSCOPIC - Abnormal; Notable for the following:    Hgb urine dipstick SMALL (*)    All other components within normal limits  URINE MICROSCOPIC-ADD ON - Abnormal; Notable for the following:    Squamous Epithelial / LPF MANY (*)    All other components within normal limits  BASIC METABOLIC PANEL  CBC  PREGNANCY, URINE  POCT PREGNANCY, URINE   Ct Abdomen Pelvis W Contrast  02/17/2011  *RADIOLOGY REPORT*  Clinical Data: Right-sided flank pain for 2 days.  CT ABDOMEN AND PELVIS WITH CONTRAST  Technique:  Multidetector CT imaging of the abdomen and pelvis was performed following the standard protocol during bolus administration of intravenous contrast.  Contrast: 80mL OMNIPAQUE IOHEXOL 300 MG/ML IV SOLN  Comparison: None.  Findings: Mild bibasilar atelectasis is noted.  A 0.8 cm hypodensity within the left hepatic lobe likely reflects a small cyst; a small 0.7 cm hypodensity  within the medial right hepatic lobe may also reflect a small cyst, but is nonspecific in appearance.  The liver is otherwise unremarkable in appearance. The spleen is normal in appearance.  The gallbladder is within normal limits.  The pancreas and adrenal glands are unremarkable.  The kidneys are unremarkable in appearance.  There is no evidence of hydronephrosis.  No renal or ureteral stones are seen.  No perinephric stranding is appreciated.  No free fluid is identified.  The small bowel is  unremarkable in appearance.  The stomach is within normal limits.  No acute vascular abnormalities are seen.  The appendix is normal in caliber, without evidence for appendicitis.  The colon is unremarkable in appearance.  The bladder is mildly distended and unremarkable in appearance. The patient is status post hysterectomy.  There is slightly nodular enhancement at the vaginal cuff, nonspecific in appearance.  Trace free fluid within the pelvic cul-de-sac is likely physiologic in nature.  The left ovary is unremarkable in appearance; no suspicious adnexal masses are seen.  No inguinal lymphadenopathy is seen.  No acute osseous abnormalities are identified.  IMPRESSION:  1.  No acute abnormalities identified within the abdomen or pelvis. 2.  Unusual nodular enhancement at the vaginal cuff, nonspecific in appearance.  No focal mass seen. 3.  Likely small hepatic cysts noted. 4.  Mild bibasilar atelectasis seen.  Original Report Authenticated By: Tonia Ghent, M.D.     1. Flank pain       MDM  45yF with r flank pain. Etiology unclear. Abdominal exam benign. CT non-suggestive. Low suspicion for sbi or acute emergent abdominal surgical process. Symptoms improved and repeat exam prior to DC unchanged. Afebrile and HD stable. Clinically well appearing. Outpt fu and prn pain meds.        Raeford Razor, MD 02/19/11 [phone]

## 2011-02-27 ENCOUNTER — Ambulatory Visit (INDEPENDENT_AMBULATORY_CARE_PROVIDER_SITE_OTHER): Payer: BC Managed Care – PPO

## 2011-02-27 DIAGNOSIS — R87619 Unspecified abnormal cytological findings in specimens from cervix uteri: Secondary | ICD-10-CM

## 2011-02-27 DIAGNOSIS — Z23 Encounter for immunization: Secondary | ICD-10-CM

## 2011-02-27 DIAGNOSIS — Z0000 Encounter for general adult medical examination without abnormal findings: Secondary | ICD-10-CM

## 2011-03-23 ENCOUNTER — Encounter: Payer: Self-pay | Admitting: Anesthesiology

## 2011-03-26 ENCOUNTER — Ambulatory Visit (INDEPENDENT_AMBULATORY_CARE_PROVIDER_SITE_OTHER): Payer: BC Managed Care – PPO | Admitting: Gynecology

## 2011-03-26 ENCOUNTER — Encounter: Payer: Self-pay | Admitting: Gynecology

## 2011-03-26 VITALS — BP 116/70 | Ht 60.5 in | Wt 153.0 lb

## 2011-03-26 DIAGNOSIS — E7800 Pure hypercholesterolemia, unspecified: Secondary | ICD-10-CM | POA: Insufficient documentation

## 2011-03-26 DIAGNOSIS — N898 Other specified noninflammatory disorders of vagina: Secondary | ICD-10-CM

## 2011-03-26 DIAGNOSIS — N893 Dysplasia of vagina, unspecified: Secondary | ICD-10-CM

## 2011-03-26 NOTE — Progress Notes (Signed)
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  Patient has not been seen the office since 2005 she had moved to [place]. 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 She presents today for further evaluation.  Colposcopic evaluation: External genitalia: Bartholin urethra Skene glands and labia majora and minora with no lesions seen. No perianal lesions seen. Vaginal mucosa after application of acetic acid acetowhite areas were noted at the 2, 5, and 8:00 position which were respectively biopsy. Silver nitrate were placed in these areas for hemostasis.  Literature information on abnormal Pap smear was provided to the patient will await further results and plan a course of management all questions were answered we'll follow accordingly.

## 2011-03-26 NOTE — Progress Notes (Signed)
Addended by: Bertram Savin A on: 03/26/2011 12:38 PM   Modules accepted: Orders

## 2011-03-26 NOTE — Patient Instructions (Signed)
Te llamaremos con [employer]

## 2011-04-03 ENCOUNTER — Encounter: Payer: Self-pay | Admitting: Gynecology

## 2011-04-03 ENCOUNTER — Telehealth: Payer: Self-pay

## 2011-04-03 ENCOUNTER — Ambulatory Visit (INDEPENDENT_AMBULATORY_CARE_PROVIDER_SITE_OTHER): Payer: BC Managed Care – PPO | Admitting: Gynecology

## 2011-04-03 VITALS — BP 126/78

## 2011-04-03 DIAGNOSIS — N890 Mild vaginal dysplasia: Secondary | ICD-10-CM | POA: Insufficient documentation

## 2011-04-03 DIAGNOSIS — N893 Dysplasia of vagina, unspecified: Secondary | ICD-10-CM

## 2011-04-03 DIAGNOSIS — A630 Anogenital (venereal) warts: Secondary | ICD-10-CM

## 2011-04-03 NOTE — Telephone Encounter (Signed)
Patient informed her C02 laser of vag dysplasia and condyloma is scheduled for Friday, Jan 25 at 7:30am.  Instructions provided. No preop consult scheduled per Dr. Lorne Skeens done today.

## 2011-04-03 NOTE — Patient Instructions (Signed)
Te llamaremos con el tiempo y fecha para la cirujia.

## 2011-04-03 NOTE — Progress Notes (Signed)
Natalie Martinez is an 46 y.o. female. Seen today for preoperative consultation. 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 She was then seen here in the office on January 3 as a result of an abnormal Pap smear the urgent care facility in [place] whereby her Pap smear had demonstrated low-grade vaginal intraepithelial lesion with HPV changes.  Patient underwent colposcopic evaluation vaginal cuff area 25 and 8:00 position were biopsied pathology report as follows:  1. 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 2. 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 3. 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 Patient has had a hysterectomy.    Past Medical History  Diagnosis Date  . NSVD (normal spontaneous vaginal delivery)     X3  . Hypercholesterolemia     Past Surgical History  Procedure Date  . Dilation and curettage of uterus   . Cervical biopsy  w/ loop electrode excision     X 2  . Abdominal hysterectomy     PARTIAL HYSTERECTOMY WITH OVARIAN PRESERVATION    Family History  Problem  Relation Age of Onset  . Diabetes Mother     Social History:  reports that she has never smoked. She has never used smokeless tobacco. She reports that she does not drink alcohol or use illicit drugs.  Allergies: No Known Allergies   (Not in a hospital admission)  @ROS @  Blood pressure 126/78.  Physical Exam:  HEENT:unremarkable Neck:Supple, midline, no thyroid megaly, no carotid bruits Lungs:  Clear to auscultation no rhonchi's or wheezes Heart:Regular rate and rhythm, no murmurs or gallops Breast Exam: Not done Abdomen: Soft nontender no rebound or guarding Pelvic:BUS within normal limits Vagina: With findings noted on colposcopic evaluation and pathology report as indicated above Cervix: Absent Uterus: Absent Adnexa: No palpable masses or tenderness Extremities: No cords, no edema Rectal: Not done  No results found for this or any previous visit (from the past 24 hour(s)).  No results found.  Assessment/plan: The VAIN ! With condyloma acuminatum. Patient with past history of vaginal hysterectomy and prior history of cervical conization prior to that for CIN-3. Discuss findings with the patient we'll proceed with CO2 laser ablation of the dysplastic lesion of the vaginal cuff and condyloma acuminatum. The risks benefits and pros and cons of the procedure were discussed with the patient instructions were provided in Spanish and we'll follow accordingly. Devann Cribb H 04/03/2011, 1:11 PM

## 2011-04-10 ENCOUNTER — Encounter (HOSPITAL_BASED_OUTPATIENT_CLINIC_OR_DEPARTMENT_OTHER): Payer: Self-pay | Admitting: *Deleted

## 2011-04-13 ENCOUNTER — Encounter (HOSPITAL_BASED_OUTPATIENT_CLINIC_OR_DEPARTMENT_OTHER): Payer: Self-pay | Admitting: *Deleted

## 2011-04-13 NOTE — Progress Notes (Signed)
NPO AFTER MN. ARRIVES AT 0600. NEEDS CBC AND UA. (PT DOES NOT NEED URINE PREG. S/P VAG. HYSTERECTOMY).

## 2011-04-17 ENCOUNTER — Ambulatory Visit (HOSPITAL_BASED_OUTPATIENT_CLINIC_OR_DEPARTMENT_OTHER)
Admission: RE | Admit: 2011-04-17 | Discharge: 2011-04-17 | Disposition: A | Discharge status: Home / Self Care | Payer: BC Managed Care – PPO | Source: Ambulatory Visit | Attending: Gynecology | Admitting: Gynecology

## 2011-04-17 ENCOUNTER — Encounter (HOSPITAL_BASED_OUTPATIENT_CLINIC_OR_DEPARTMENT_OTHER): Payer: Self-pay | Admitting: Anesthesiology

## 2011-04-17 ENCOUNTER — Encounter (HOSPITAL_BASED_OUTPATIENT_CLINIC_OR_DEPARTMENT_OTHER): Payer: Self-pay | Admitting: *Deleted

## 2011-04-17 ENCOUNTER — Ambulatory Visit (HOSPITAL_BASED_OUTPATIENT_CLINIC_OR_DEPARTMENT_OTHER): Payer: BC Managed Care – PPO | Admitting: Anesthesiology

## 2011-04-17 ENCOUNTER — Encounter (HOSPITAL_BASED_OUTPATIENT_CLINIC_OR_DEPARTMENT_OTHER)
Admission: RE | Disposition: A | Discharge status: Home / Self Care | Payer: Self-pay | Source: Ambulatory Visit | Attending: Gynecology

## 2011-04-17 DIAGNOSIS — N900 Mild vulvar dysplasia: Secondary | ICD-10-CM | POA: Insufficient documentation

## 2011-04-17 DIAGNOSIS — N890 Mild vaginal dysplasia: Secondary | ICD-10-CM

## 2011-04-17 DIAGNOSIS — A630 Anogenital (venereal) warts: Secondary | ICD-10-CM

## 2011-04-17 DIAGNOSIS — E785 Hyperlipidemia, unspecified: Secondary | ICD-10-CM

## 2011-04-17 DIAGNOSIS — N893 Dysplasia of vagina, unspecified: Secondary | ICD-10-CM

## 2011-04-17 DIAGNOSIS — R3129 Other microscopic hematuria: Secondary | ICD-10-CM

## 2011-04-17 DIAGNOSIS — K5900 Constipation, unspecified: Secondary | ICD-10-CM

## 2011-04-17 DIAGNOSIS — E7800 Pure hypercholesterolemia, unspecified: Secondary | ICD-10-CM | POA: Insufficient documentation

## 2011-04-17 DIAGNOSIS — N3000 Acute cystitis without hematuria: Secondary | ICD-10-CM

## 2011-04-17 LAB — URINALYSIS, ROUTINE W REFLEX MICROSCOPIC
Bilirubin Urine: NEGATIVE
Nitrite: NEGATIVE
Protein, ur: NEGATIVE mg/dL
Specific Gravity, Urine: 1.02 (ref 1.005–1.030)
Urobilinogen, UA: 0.2 mg/dL (ref 0.0–1.0)

## 2011-04-17 LAB — URINE MICROSCOPIC-ADD ON

## 2011-04-17 LAB — CBC
MCHC: 33.7 g/dL (ref 30.0–36.0)
Platelets: 212 10*3/uL (ref 150–400)
RDW: 12.5 % (ref 11.5–15.5)
WBC: 5.6 10*3/uL (ref 4.0–10.5)

## 2011-04-17 SURGERY — CO2 LASER APPLICATION
Anesthesia: General | Site: Vagina | Wound class: Clean Contaminated

## 2011-04-17 MED ORDER — SILVER NITRATE-POT NITRATE 75-25 % EX MISC
CUTANEOUS | Status: DC | PRN
  Administered 2011-04-17: 1

## 2011-04-17 MED ORDER — FENTANYL CITRATE 0.05 MG/ML IJ SOLN
INTRAMUSCULAR | Status: DC | PRN
  Administered 2011-04-17: 50 ug via INTRAVENOUS
  Administered 2011-04-17: 25 ug via INTRAVENOUS
  Administered 2011-04-17: 50 ug via INTRAVENOUS

## 2011-04-17 MED ORDER — ESTRADIOL 0.1 MG/GM VA CREA
TOPICAL_CREAM | VAGINAL | Status: DC | PRN
  Administered 2011-04-17: 1 via VAGINAL

## 2011-04-17 MED ORDER — OXYCODONE-ACETAMINOPHEN 5-500 MG PO CAPS: 1.0000 | ORAL_CAPSULE | ORAL | Status: AC | PRN

## 2011-04-17 MED ORDER — SILVER SULFADIAZINE 1 % EX CREA
TOPICAL_CREAM | CUTANEOUS | Status: DC | PRN
  Administered 2011-04-17: 1 via TOPICAL

## 2011-04-17 MED ORDER — LACTATED RINGERS IV SOLN: INTRAVENOUS | Status: DC

## 2011-04-17 MED ORDER — METOCLOPRAMIDE HCL 10 MG PO TABS: 10.0000 mg | ORAL_TABLET | Freq: Three times a day (TID) | ORAL | Status: AC

## 2011-04-17 MED ORDER — DEXAMETHASONE SODIUM PHOSPHATE 4 MG/ML IJ SOLN
INTRAMUSCULAR | Status: DC | PRN
  Administered 2011-04-17: 10 mg via INTRAVENOUS

## 2011-04-17 MED ORDER — FENTANYL CITRATE 0.05 MG/ML IJ SOLN: 25.0000 ug | INTRAMUSCULAR | Status: DC | PRN

## 2011-04-17 MED ORDER — PROMETHAZINE HCL 25 MG/ML IJ SOLN: 6.2500 mg | INTRAMUSCULAR | Status: DC | PRN

## 2011-04-17 MED ORDER — PROPOFOL 10 MG/ML IV EMUL
INTRAVENOUS | Status: DC | PRN
  Administered 2011-04-17: 160 mg via INTRAVENOUS

## 2011-04-17 MED ORDER — MIDAZOLAM HCL 5 MG/5ML IJ SOLN
INTRAMUSCULAR | Status: DC | PRN
  Administered 2011-04-17: 2 mg via INTRAVENOUS

## 2011-04-17 MED ORDER — KETOROLAC TROMETHAMINE 30 MG/ML IJ SOLN
INTRAMUSCULAR | Status: DC | PRN
  Administered 2011-04-17: 30 mg via INTRAVENOUS

## 2011-04-17 MED ORDER — ONDANSETRON HCL 4 MG/2ML IJ SOLN
INTRAMUSCULAR | Status: DC | PRN
  Administered 2011-04-17: 4 mg via INTRAVENOUS

## 2011-04-17 MED ORDER — LIDOCAINE HCL (CARDIAC) 20 MG/ML IV SOLN
INTRAVENOUS | Status: DC | PRN
  Administered 2011-04-17: 60 mg via INTRAVENOUS

## 2011-04-17 MED ORDER — ACETIC ACID 5 % SOLN
Status: DC | PRN
  Administered 2011-04-17: 1 via TOPICAL

## 2011-04-17 MED ORDER — LACTATED RINGERS IV SOLN
INTRAVENOUS | Status: DC
  Administered 2011-04-17: 100 mL/h via INTRAVENOUS

## 2011-04-17 SURGICAL SUPPLY — 48 items
APPLICATOR COTTON TIP 6IN STRL (MISCELLANEOUS) ×6 IMPLANT
BLADE SURG 15 STRL LF DISP TIS (BLADE) IMPLANT
BLADE SURG 15 STRL SS (BLADE)
CANISTER SUCTION 1200CC (MISCELLANEOUS) IMPLANT
CANISTER SUCTION 2500CC (MISCELLANEOUS) ×2 IMPLANT
CATH ROBINSON RED A/P 14FR (CATHETERS) IMPLANT
CLOTH BEACON ORANGE TIMEOUT ST (SAFETY) ×2 IMPLANT
COVER TABLE BACK 60X90 (DRAPES) IMPLANT
DEPRESSOR TONGUE BLADE STERILE (MISCELLANEOUS) ×2 IMPLANT
DRAPE LG THREE QUARTER DISP (DRAPES) ×2 IMPLANT
DRAPE UNDERBUTTOCKS STRL (DRAPE) ×2 IMPLANT
ELECT BALL LEEP 3MM BLK (ELECTRODE) ×2 IMPLANT
ELECT NEEDLE TIP 2.8 STRL (NEEDLE) IMPLANT
ELECT REM PT RETURN 9FT ADLT (ELECTROSURGICAL)
ELECTRODE REM PT RTRN 9FT ADLT (ELECTROSURGICAL) IMPLANT
GLOVE BIOGEL PI IND STRL 6.5 (GLOVE) ×1 IMPLANT
GLOVE BIOGEL PI INDICATOR 6.5 (GLOVE) ×1
GLOVE ECLIPSE 6.0 STRL STRAW (GLOVE) ×2 IMPLANT
GLOVE ECLIPSE 7.5 STRL STRAW (GLOVE) ×2 IMPLANT
GLOVE INDICATOR 8.0 STRL GRN (GLOVE) ×2 IMPLANT
GOWN PREVENTION PLUS LG XLONG (DISPOSABLE) ×2 IMPLANT
GOWN STRL REIN XL XLG (GOWN DISPOSABLE) ×2 IMPLANT
LEGGING LITHOTOMY PAIR STRL (DRAPES) ×2 IMPLANT
NDL SAFETY ECLIPSE 18X1.5 (NEEDLE) IMPLANT
NEEDLE HYPO 18GX1.5 SHARP (NEEDLE)
NS IRRIG 500ML POUR BTL (IV SOLUTION) IMPLANT
PACK BASIN DAY SURGERY FS (CUSTOM PROCEDURE TRAY) ×2 IMPLANT
PAD OB MATERNITY 4.3X12.25 (PERSONAL CARE ITEMS) ×2 IMPLANT
PAD PREP 24X48 CUFFED NSTRL (MISCELLANEOUS) ×2 IMPLANT
PENCIL BUTTON HOLSTER BLD 10FT (ELECTRODE) IMPLANT
SCOPETTES 8  STERILE (MISCELLANEOUS)
SCOPETTES 8 STERILE (MISCELLANEOUS) IMPLANT
SUT VIC AB 2-0 PS2 27 (SUTURE) IMPLANT
SUT VIC AB 3-0 CT1 27 (SUTURE)
SUT VIC AB 3-0 CT1 27XBRD (SUTURE) IMPLANT
SUT VIC AB 3-0 FS2 27 (SUTURE) IMPLANT
SUT VIC AB 3-0 X1 27 (SUTURE) IMPLANT
SWAB CULTURE LIQ STUART DBL (MISCELLANEOUS) IMPLANT
SYR TB 1ML LL NO SAFETY (SYRINGE) IMPLANT
SYRINGE IRR TOOMEY STRL 70CC (SYRINGE) IMPLANT
TOWEL OR 17X24 6PK STRL BLUE (TOWEL DISPOSABLE) ×4 IMPLANT
TRAY DSU PREP LF (CUSTOM PROCEDURE TRAY) ×2 IMPLANT
TUBE ANAEROBIC SPECIMEN COL (MISCELLANEOUS) IMPLANT
TUBE CONNECTING 12X1/4 (SUCTIONS) IMPLANT
VACUUM HOSE 7/8X10 W/ WAND (MISCELLANEOUS) IMPLANT
VACUUM HOSE/TUBING 7/8INX6FT (MISCELLANEOUS) ×2 IMPLANT
WATER STERILE IRR 500ML POUR (IV SOLUTION) ×4 IMPLANT
YANKAUER SUCT BULB TIP NO VENT (SUCTIONS) IMPLANT

## 2011-04-17 NOTE — Anesthesia Postprocedure Evaluation (Signed)
  Anesthesia Post-op Note  Patient: Natalie Martinez  Procedure(s) Performed:  CO2 LASER APPLICATION - CO2 LASER ABLATION OF VAGINAL DYSPLASIA AND CONDYLOMA ACCUMINATUM.   Patient Location: PACU  Anesthesia Type: General  Level of Consciousness: awake and alert   Airway and Oxygen Therapy: Patient Spontanous Breathing  Post-op Pain: mild  Post-op Assessment: Post-op Vital signs reviewed, Patient's Cardiovascular Status Stable, Respiratory Function Stable, Patent Airway and No signs of Nausea or vomiting  Post-op Vital Signs: stable  Complications: No apparent anesthesia complications

## 2011-04-17 NOTE — Anesthesia Procedure Notes (Signed)
Procedure Name: LMA Insertion Performed by: Brandii Lakey Pre-anesthesia Checklist: Patient identified, Emergency Drugs available, Suction available and Patient being monitored Patient Re-evaluated:Patient Re-evaluated prior to inductionOxygen Delivery Method: Circle System Utilized Preoxygenation: Pre-oxygenation with 100% oxygen Intubation Type: IV induction Ventilation: Mask ventilation without difficulty LMA: LMA inserted LMA Size: 4.0 Number of attempts: 1 Airway Equipment and Method: bite block Placement Confirmation: positive ETCO2 Dental Injury: Teeth and Oropharynx as per pre-operative assessment      

## 2011-04-17 NOTE — Transfer of Care (Signed)
Immediate Anesthesia Transfer of Care Note  Patient: Natalie Martinez  Procedure(s) Performed:  CO2 LASER APPLICATION - CO2 LASER ABLATION OF VAGINAL DYSPLASIA AND CONDYLOMA ACCUMINATUM.   Patient Location: PACU  Anesthesia Type: General  Level of Consciousness: sedated and responds to stimulation  Airway & Oxygen Therapy: Patient Spontanous Breathing and Patient connected to nasal cannula oxygen  Post-op Assessment: Report given to PACU RN  Post vital signs: Reviewed and stable  Complications: No apparent anesthesia complications and

## 2011-04-17 NOTE — H&P (View-Only) (Signed)
Natalie Martinez is an 45 y.o. female. Seen today for preoperative consultation. 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 She was then seen here in the office on January 3 as a result of an abnormal Pap smear the urgent care facility in [place] whereby her Pap smear had demonstrated low-grade vaginal intraepithelial lesion with HPV changes.  Patient underwent colposcopic evaluation vaginal cuff area 25 and 8:00 position were biopsied pathology report as follows:  1. 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 2. 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 3. 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 Patient has had a hysterectomy.    Past Medical History  Diagnosis Date  . NSVD (normal spontaneous vaginal delivery)     X3  . Hypercholesterolemia     Past Surgical History  Procedure Date  . Dilation and curettage of uterus   . Cervical biopsy  w/ loop electrode excision     X 2  . Abdominal hysterectomy     PARTIAL HYSTERECTOMY WITH OVARIAN PRESERVATION    Family History  Problem  Relation Age of Onset  . Diabetes Mother     Social History:  reports that she has never smoked. She has never used smokeless tobacco. She reports that she does not drink alcohol or use illicit drugs.  Allergies: No Known Allergies   (Not in a hospital admission)  @ROS@  Blood pressure 126/78.  Physical Exam:  HEENT:unremarkable Neck:Supple, midline, no thyroid megaly, no carotid bruits Lungs:  Clear to auscultation no rhonchi's or wheezes Heart:Regular rate and rhythm, no murmurs or gallops Breast Exam: Not done Abdomen: Soft nontender no rebound or guarding Pelvic:BUS within normal limits Vagina: With findings noted on colposcopic evaluation and pathology report as indicated above Cervix: Absent Uterus: Absent Adnexa: No palpable masses or tenderness Extremities: No cords, no edema Rectal: Not done  No results found for this or any previous visit (from the past 24 hour(s)).  No results found.  Assessment/plan: The VAIN ! With condyloma acuminatum. Patient with past history of vaginal hysterectomy and prior history of cervical conization prior to that for CIN-3. Discuss findings with the patient we'll proceed with CO2 laser ablation of the dysplastic lesion of the vaginal cuff and condyloma acuminatum. The risks benefits and pros and cons of the procedure were discussed with the patient instructions were provided in Spanish and we'll follow accordingly. Leighanne Adolph H 04/03/2011, 1:11 PM   

## 2011-04-17 NOTE — Anesthesia Preprocedure Evaluation (Addendum)
Anesthesia Evaluation  Patient identified by MRN, date of birth, ID band Patient awake    Reviewed: Allergy & Precautions, H&P , NPO status , Patient's Chart, lab work & pertinent test results  Airway Mallampati: II TM Distance: >3 FB Neck ROM: full    Dental No notable dental hx. (+) Teeth Intact and Dental Advisory Given   Pulmonary neg pulmonary ROS,  clear to auscultation  Pulmonary exam normal       Cardiovascular Exercise Tolerance: Good neg cardio ROS regular Normal    Neuro/Psych Negative Neurological ROS  Negative Psych ROS   GI/Hepatic negative GI ROS, Neg liver ROS,   Endo/Other  Negative Endocrine ROS  Renal/GU negative Renal ROS  Genitourinary negative   Musculoskeletal   Abdominal   Peds  Hematology negative hematology ROS (+)   Anesthesia Other Findings   Reproductive/Obstetrics negative OB ROS                          Anesthesia Physical Anesthesia Plan  ASA: I  Anesthesia Plan: General   Post-op Pain Management:    Induction: Intravenous  Airway Management Planned: LMA  Additional Equipment:   Intra-op Plan:   Post-operative Plan:   Informed Consent: I have reviewed the patients History and Physical, chart, labs and discussed the procedure including the risks, benefits and alternatives for the proposed anesthesia with the patient or authorized representative who has indicated his/her understanding and acceptance.   Dental Advisory Given  Plan Discussed with: CRNA and Surgeon  Anesthesia Plan Comments:         Anesthesia Quick Evaluation  

## 2011-04-17 NOTE — Interval H&P Note (Signed)
History and Physical Interval Note:  04/17/2011 7:03 AM  Natalie Martinez  has presented today for surgery, with the diagnosis of vaginal dysplasia, condyloma accuminatum  The various methods of treatment have been discussed with the patient and family. After consideration of risks, benefits and other options for treatment, the patient has consented to  Procedure(s): CO2 LASER APPLICATION as a surgical intervention .  The patients' history has been reviewed, patient examined, no change in status, stable for surgery.  I have reviewed the patients' chart and labs.  Questions were answered to the patient's satisfaction.     Ok Edwards

## 2011-04-17 NOTE — Op Note (Signed)
04/17/2011  8:10 AM  PATIENT:  Natalie Martinez  46 y.o. 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  DESCRIPTION OF OPERATION: The patient was taken to the operating room where she underwent successful general endotracheal anesthesia. She was placed in the high lithotomy position. Wet towels were placed around the perineum. Titanium coated speculum was placed in the vaginal vault with sidewall retractors for exposure. Acetic acid was then applied in a systematic inspection the entire vagina had demonstrated scattered small islands of raised leukoplakic areas and condylomatous growths. The CO2 laser beam was set at 6 W with a spot size of 1.5-2 mm. The brush like technique the areas of were ablated and lateral to to each of the lesions for extra ablation for those lesions that may not of been seen. Upon completion Estrace cream was applied intravaginally. The rest of the external genitalia perineum and perirectal area with no lesions seen. The patient was extubated transferred to recovery room stable vital signs there was no blood loss.  ESTIMATED BLOOD LOSS: None  Intake/Output Summary (Last 24 hours) at 04/17/11 0810 Last data filed at 04/17/11 1610  Gross per 24 hour  Intake    400 ml  Output      0 ml  Net    400 ml     BLOOD ADMINISTERED:none   LOCAL MEDICATIONS USED:  NONE  SPECIMEN:  Source of Specimen:  None  DISPOSITION OF SPECIMEN:  N/A  COUNTS:  YES  PLAN OF CARE: Transfer to PACU  [facility] HMD8:10 AMTD@

## 2011-04-20 ENCOUNTER — Telehealth: Payer: Self-pay

## 2011-04-20 NOTE — Telephone Encounter (Signed)
Patient said she had outpatient surgery Friday and was supposed to get note from Dr. Glenetta Hew but she did not get it.  She needs me to fax letter stating okay to return to work today with no restrictions.  Letter on file in e-chart.

## 2011-05-01 ENCOUNTER — Encounter: Payer: Self-pay | Admitting: Gynecology

## 2011-05-01 ENCOUNTER — Ambulatory Visit (INDEPENDENT_AMBULATORY_CARE_PROVIDER_SITE_OTHER): Payer: BC Managed Care – PPO | Admitting: Gynecology

## 2011-05-01 VITALS — BP 128/76

## 2011-05-01 DIAGNOSIS — Z9889 Other specified postprocedural states: Secondary | ICD-10-CM

## 2011-05-01 NOTE — Patient Instructions (Signed)
Usar la crema vaginal diaria hasta que se le acabew. Recuerdase de hacer cita para la mammografia. Nos vewmos en seis meses.

## 2011-05-01 NOTE — Progress Notes (Signed)
Patient is a 46 year old who presented to the office for her postop visit. Patient status post CO2 laser ablation of vaginal dysplasia (VAIN I/condyloma acuminatum) of vaginal cuff.  Patient's past dysplasia history as follows: 2003 abnormal Pap smear cervical biopsy demonstrated CIN-3 2004 LEEP cervical conization pathology report CIN-2 margins clear 2005 transvaginal hysterectomy for recurrent dysplasia and sterilization pathology report no residual dysplasia reported 2013 vaginal cuff biopsy demonstratedCONDYLOMA ACUMINATUM WITH ASSOCIATED LOW GRADE SQUAMOUS INTRAEPITHELIAL LESION, VAIN-I.  Patient is doing well otherwise today. Pelvic exam: Bartholin urethra Skene was within normal limits Vagina no gross lesions on inspection Vaginal cuff granulation tissue present healing well slightly friable silver nitrate applied.   Assessment/plan: Status post CO2 laser ablation of vaginal cuff condyloma acuminatum with VAIN I healing well. Patient will continue Estrace vaginal cream each bedtime for one more week. She is to return back to the office in 6 months when she due for her annual exam. Requisition was provided her to schedule her mammogram which is overdue as well. All the above was explained to the patient in Spanish all questions were answered and we'll follow accordingly.

## 2011-05-08 ENCOUNTER — Other Ambulatory Visit: Payer: Self-pay | Admitting: Gynecology

## 2011-05-08 DIAGNOSIS — Z1231 Encounter for screening mammogram for malignant neoplasm of breast: Secondary | ICD-10-CM

## 2011-05-28 ENCOUNTER — Ambulatory Visit
Admission: RE | Admit: 2011-05-28 | Discharge: 2011-05-28 | Disposition: A | Discharge status: Home / Self Care | Payer: BC Managed Care – PPO | Source: Ambulatory Visit | Attending: Gynecology | Admitting: Gynecology

## 2011-05-28 DIAGNOSIS — Z1231 Encounter for screening mammogram for malignant neoplasm of breast: Secondary | ICD-10-CM

## 2011-07-10 ENCOUNTER — Ambulatory Visit (INDEPENDENT_AMBULATORY_CARE_PROVIDER_SITE_OTHER): Payer: BC Managed Care – PPO | Admitting: Internal Medicine

## 2011-07-10 VITALS — BP 105/72 | HR 80 | Temp 98.5°F | Resp 16 | Ht 61.0 in | Wt 150.6 lb

## 2011-07-10 DIAGNOSIS — J039 Acute tonsillitis, unspecified: Secondary | ICD-10-CM

## 2011-07-10 DIAGNOSIS — J020 Streptococcal pharyngitis: Secondary | ICD-10-CM

## 2011-07-10 LAB — POCT RAPID STREP A (OFFICE): Rapid Strep A Screen: POSITIVE — AB

## 2011-07-10 MED ORDER — PENICILLIN G BENZATHINE 1200000 UNIT/2ML IM SUSP
1.2000 10*6.[IU] | Freq: Once | INTRAMUSCULAR | Status: AC
  Administered 2011-07-10: 1.2 10*6.[IU] via INTRAMUSCULAR

## 2011-07-10 NOTE — Progress Notes (Signed)
  Subjective:    Patient ID: Natalie Martinez, female    DOB: 22-Mar-1966, 46 y.o.   MRN: [id number]  HPISore throat fever and aching all over for 3 days fever 105? last night No cough    Review of Systems     Objective:   Physical ExamVital signs stable TMs clear/nares clear Tonsils 3+ with exudate 2+ a.c. Nodes tender Chest clear    Rapid strep positive      Assessment & Plan:  Problem #1 strep tonsillitis 1.2 million units Bicillin LA Antipyretics Followup if not better in 48 hours

## 2011-08-10 ENCOUNTER — Encounter

## 2011-11-06 ENCOUNTER — Encounter: Payer: Self-pay | Admitting: Gynecology

## 2011-11-06 ENCOUNTER — Ambulatory Visit (INDEPENDENT_AMBULATORY_CARE_PROVIDER_SITE_OTHER): Payer: BC Managed Care – PPO | Admitting: Gynecology

## 2011-11-06 ENCOUNTER — Other Ambulatory Visit (HOSPITAL_COMMUNITY)
Admission: RE | Admit: 2011-11-06 | Discharge: 2011-11-06 | Disposition: A | Discharge status: Home / Self Care | Payer: BC Managed Care – PPO | Source: Ambulatory Visit | Attending: Gynecology | Admitting: Gynecology

## 2011-11-06 VITALS — BP 120/74 | Ht 60.5 in | Wt 160.0 lb

## 2011-11-06 DIAGNOSIS — Z23 Encounter for immunization: Secondary | ICD-10-CM

## 2011-11-06 DIAGNOSIS — Z1151 Encounter for screening for human papillomavirus (HPV): Secondary | ICD-10-CM | POA: Insufficient documentation

## 2011-11-06 DIAGNOSIS — Z01419 Encounter for gynecological examination (general) (routine) without abnormal findings: Secondary | ICD-10-CM

## 2011-11-06 DIAGNOSIS — Z833 Family history of diabetes mellitus: Secondary | ICD-10-CM

## 2011-11-06 DIAGNOSIS — E7800 Pure hypercholesterolemia, unspecified: Secondary | ICD-10-CM

## 2011-11-06 DIAGNOSIS — R87810 Cervical high risk human papillomavirus (HPV) DNA test positive: Secondary | ICD-10-CM | POA: Insufficient documentation

## 2011-11-06 DIAGNOSIS — R635 Abnormal weight gain: Secondary | ICD-10-CM

## 2011-11-06 NOTE — Patient Instructions (Addendum)
Control del colesterol  Los niveles de colesterol en el organismo estn determinados significativamente por su dieta. Los niveles de colesterol tambin se relacionan con la enfermedad cardaca. El material que sigue ayuda a Software engineer relacin y a Chiropractor qu puede hacer para mantener su corazn sano. No todo el colesterol es [place]. Las lipoprotenas de baja densidad (LDL) forman el colesterol "malo". El colesterol malo puede ocasionar depsitos de grasa que se acumulan en el interior de las arterias. Las lipoprotenas de alta densidad (HDL) es el colesterol "bueno". Ayuda a remover el colesterol LDL "malo" de la [place]. El colesterol es un factor de riesgo muy importante para la enfermedad cardaca. Otros factores de riesgo son la hipertensin arterial, el hbito de fumar, el estrs, la herencia y [place].  El msculo cardaco obtiene el suministro de sangre a travs de las arterias coronarias. Si su colesterol LDL ("malo") est elevado y el HDL ("bueno") es bajo, tiene un factor de riesgo para que se formen depsitos de Holiday representative en las arterias coronarias (los vasos sanguneos que suministran sangre al corazn). Esto hace que haya menos lugar para que la sangre circule. Sin la suficiente sangre y oxgeno, el msculo cardaco no puede funcionar correctamente, y usted podr sentir dolores en el pecho (angina pectoris). Cuando una arteria coronaria se cierra completamente, una parte del msculo cardaco puede morir (infarto de miocardio). CONTROL DEL COLESTEROL Cuando el profesional que lo asiste enva la sangre al laboratorio para Artist nivel de colesterol, puede realizarle tambin un perfil completo de los lpidos. Con esta prueba, se puede determinar la cantidad total de colesterol, as como los niveles de LDL y HDL. Los triglicridos son un tipo de grasa que circula en la sangre y que tambin puede utilizarse para determinar el riesgo de enfermedad  cardaca. En la siguiente tabla se establecen los nmeros ideales: Prueba: Colesterol total  Menos de 200 mg/dl.  Prueba: LDL "colesterol malo"  Menos de 100 mg/dl.   Menos de 70 mg/dl si tiene riesgo muy elevado de sufrir un ataque cardaco o muerte cardaca sbita.  Prueba: HDL "colesterol bueno"  Mujeres: Ms de 50 mg/dl.   Hombres: Ms de 40 mg/dl.  Prueba: Trigliceridos  Menos de 150 mg/dl.  CONTROL DEL COLESTEROL CON DIETA Aunque factores como el ejercicio y el estilo de vida son importantes, la "primera lnea de ataque" es la dieta. Esto se debe a que se sabe que ciertos alimentos hacen subir el colesterol y otros lo [place]. El objetivo debe ser [employer] alimentos, de modo que tengan un efecto sobre el colesterol y, an ms importante, Microbiologist las grasas saturadas y trans con otros tipos de grasas, como las monoinsaturadas y las poliinsaturadas y cidos grasos omega-3 . En promedio, una persona no debe consumir ms de 15 a 17 g de grasas saturadas por [employer]. Las grasas saturadas y trans se consideran grasas "malas", ya que elevan el colesterol LDL. Las grasas saturadas se encuentran principalmente en productos animales como carne, Miles y crema. Pero esto no significa que usted Marketing executive todas sus comidas favoritas. Actualmente, como lo muestra el cuadro que figura al final de este documento, hay sustitutos de buen sabor, bajos en grasas y en colesterol, para la mayora de los alimentos que a usted Musician. Elija aquellos alimentos alternativos que sean bajos en grasas o sin grasas. Elija cortes de carne del cuarto trasero o lomo ya que estos cortes son los que tienen menor cantidad de grasa y Oncologist. El pollo (  sin piel), el pescado, la carne de ternera, y la [place] de [place] molida son excelentes opciones. Elimine las carnes [employer] Foods o el salami. Los [employer] o nada de grasas saturadas. Cuando consuma carne [place], carne de aves de  corral, o pescado, hgalo en porciones de 85 gramos (3 onzas). Las grasas trans tambin se llaman "aceites parcialmente hidrogenados". Son aceites manipulados cientficamente de [place] que son slidos a Publishing rights manager, tienen una larga vida y Glass blower/designer sabor y la textura de los alimentos a los que se Scientist, clinical (histocompatibility and immunogenetics). Las grasas trans se encuentran en la [place], [place], crackers y alimentos horneados.  Para hornear y cocinar, el aceite es un excelente sustituto para la [place]. Los aceites monoinsaturados tienen un beneficio particular, ya que se cree que disminuyen el colesterol LDL (colesterol malo) y elevan el HDL. Deber evitar los aceites tropicales saturados como el de coco y el de [place].  Recuerde, adems, que puede comer sin restricciones los grupos de alimentos que son naturalmente libres de grasas saturadas y Neurosurgeon trans, entre los que se incluyen el pescado, las frutas (excepto el [place]), verduras, frijoles, cereales (cebada, arroz, [place], trigo) y las pastas (sin salsas con crema)  IDENTIFIQUE LOS ALIMENTOS QUE DISMINUYEN EL COLESTEROL  Pueden disminuir el colesterol las fibras solubles que estn en las frutas, como las [place], en los vegetales como el brcoli, las patatas y las zanahorias; en las legumbres como frijoles, guisantes y Therapist, occupational; y en los cereales como la cebada. Los alimentos fortificados con fitosteroles tambin Engineer, production. Debe consumir al menos 2 g de estos alimentos a diario para Financial planner de disminucin de [place].  En el supermercado, lea las etiquetas de los envases para identificar los alimentos bajos en grasas saturadas, libres de grasas trans y bajos en [place], . Elija quesos que tengan solo de 2 a 3 g de grasa saturada por onza (28,35 g). Use una margarina que no dae el corazn, [place] de grasas trans o aceite parcialmente hidrogenado. Al comprar alimentos horneados (galletitas dulces y Gaffer) evite el aceite parcialmente  hidrogenado. Los panes y bollos debern ser de granos enteros (harina de maz o de avena entera, en lugar de "harina" o "harina enriquecida"). Compre sopas en lata que no sean cremosas, con bajo contenido de sal y sin grasas adicionadas.  TCNICAS DE PREPARACIN DE LOS ALIMENTOS  Nunca fra los alimentos en aceite abundante. Si debe frer, hgalo en poco aceite y removiendo [place], porque as se utilizan muy pocas grasas, o utilice un spray antiadherente. Cuando le sea posible, hierva, hornee o ase las carnes y cocine los vegetales al vapor. En vez de [employer] con mantequilla o [place], utilice limn y hierbas, pur de Psychologist, educational y canela (para las calabazas y batatas), yogurt y salsa descremados y aderezos para ensaladas bajos en contenido graso.  BAJO EN GRASAS SATURADAS / SUSTITUTOS BAJOS EN GRASA  Carnes / Grasas saturadas (g)  Evite: Bife, corte graso (3 oz/85 g) / 11 g   Elija: Bife, corte magro (3 oz/85 g) / 4 g   Evite: Hamburguesa (3 oz/85 g) / 7 g   Elija:  [place]amb[place] magra (3 oz/85 g) / 5 g   Evite: Jamn (3 oz/85 g) / 6 g   Elija:  Jamn magro (3 oz/85 g) / 2.4 g   Evite: Pollo, con piel (3 oz/85 g), Carne oscura / 4 g   Elija:  Pollo, sin piel (3 oz/85 g), Carne oscura / 2  g   Evite: Pollo, con piel (3 oz/85 g), Carne magra / 2.5 g   Elija: Pollo, sin piel (3 oz/85 g), Carne magra / 1 g  Lcteos / Grasas saturadas (g)  Evite: Leche entera (1 taza) / 5 g   Elija: Leche con bajo contenido de grasa, 2% (1 taza) / 3 g   Elija: Leche con bajo contenido de grasa, 1% (1 taza) / 1.5 g   Elija: Leche descremada (1 taza) / 0.3 g   Evite: Queso duro (1 oz/28 g) / 6 g   Elija: Queso descremado (1 oz/28 g) / 2-3 g   Evite: Queso cottage, 4% grasa (1 taza)/ 6.5 g   Elija: Queso cottage con bajo contenido de grasa, 1% grasa (1 taza)/ 1.5 g   Evite: Helado (1 taza) / 9 g   Elija: Sorbete (1 taza) / 2.5 g   Elija: Yogurt helado sin contenido de grasa  (1 taza) / 0.3 g   Elija: Barras de fruta congeladas / vestigios   Evite: Crema batida (1 cucharada) / 3.5 g   Elija: Batidos glac sin lcteos (1 cucharada) / 1 g  Condimentos / Grasas saturadas (g)  Evite: Mayonesa (1 cucharada) / 2 g   Elija: Mayonesa con bajo contenido de grasa (1 cucharada) / 1 g   Evite: [place] (1 cucharada) / 7 g   Elija: Margarina extra light (1 cucharada) / 1 g   Evite: Aceite de coco (1 cucharada) / 11.8 g   Elija: Aceite de oliva (1 cucharada) / 1.8 g   Elija: Aceite de maz (1 cucharada) / 1.7 g   Elija: Aceite de crtamo (1 cucharada) / 1.2 g   Elija: Aceite de girasol (1 cucharada) / 1.4 g   Elija: Aceite de soja (1 cucharada) / 2.4 g   Elija: Aceite de canola (1 cucharada) / 1 g  Document Released: 03/09/2005 Document Revised: 11/19/2010 [facility] Patient Information 2012 [place], [place].  Ejercicios para perder peso (Exercise to Lose Weight) La actividad fsica y [place] Dear dieta saludable ayudan a perder peso. El mdico podr sugerirle ejercicios especficos. IDEAS Y CONSEJOS PARA HACER EJERCICIOS  Elija opciones econmicas que disfrute hacer , como caminar, andar en bicicleta o los vdeos para ejercitarse.   Utilice las Microbiologist del ascensor.   Camine durante la hora del almuerzo.   Estacione el auto lejos del lugar de [place] o [place].   Concurra a un gimnasio o tome clases de gimnasia.   Comience con 5  10 minutos de actividad fsica por da. Ejercite hasta 30 minutos, 4 a 6 das por [address]   Utilice zapatos que tengan un buen soporte y ropas cmodas.   Elongue antes y despus de Company secretary.   Ejercite hasta que aumente la respiracin y el corazn palpite rpido.   Beba agua extra cuando ejercite.   No haga ejercicio Firefighter, sentirse mareado o que le falte mucho el aire.  La actividad fsica puede quemar alrededor de 150 caloras.  Correr 20 cuadras en 15 minutos.   Jugar vley durante 45 a 60 minutos.     Limpiar y encerar el auto durante 45 a 60 minutos.   Jugar ftbol americano de toque.   Caminar 25 cuadras en 35 minutos.   Empujar un cochecito 20 cuadras en 30 minutos.   Jugar baloncesto durante 30 minutos.   Rastrillar hojas secas durante 30 minutos.   Andar en bicicleta 80 cuadras en 30 minutos.   Caminar 30 cuadras en  30 minutos.   Bailar durante 30 minutos.   Quitar la nieve con una pala durante 15 minutos.   Nadar vigorosamente durante 20 minutos.   Subir escaleras durante 15 minutos.   Andar en bicicleta 60 cuadras durante 15 minutos.   Arreglar el jardn entre 30 y 45 minutos.   Saltar a la soga durante 15 minutos.   Limpiar vidrios o pisos durante 45 a 60 minutos.  Document Released: 06/13/2010 Document Revised: 11/19/2010 [facility] Patient Information 2012 [place], [place].    Vacuna contra la difteria, el ttanos, y la tos ferina Lo que usted necesita saber (Diphtheria, Tetanus, and Pertussis [DTaP] Vaccine) POR QU VACUNARSE? La difteria, el ttano y la tos Benetta Spar son enfermedades graves provocadas por bacterias. La difteria y la tos Benetta Spar se [place] de persona a Social worker. El ttano ingresa al cuerpo a travs de cortes o heridas. La difteria produce un recubrimiento denso en la parte trasera de la garganta.  Puede producir problemas para respirar, parlisis, insuficiencia cardaca, e incluso la muerte.  El ttanos causa contracturas dolorosas de los msculos, a menudo en todo el cuerpo.  Puede ocasionar un "bloqueo" de la [place], de modo que es imposible abrir la boca o tragar. El ttanos produce la muerte en alrededor de 2 cada 10 casos.  La tos ferina produce ataques de tos tan fuertes que, en nios, imposibilita comer, beber o respirar. Estos ataques pueden durar semanas.  Puede producir neumona, convulsiones (ataques de espasmos o ausencias), dao cerebral, y la muerte.  La vacuna para la difteria, el ttano y la tos [place] (DTPa) puede  ayudar a Market researcher. La mayor parte de los nios que la reciben estarn protegidos durante toda su niez. Muchos ms nios padeceran estas enfermedades si no fueran vacunados. La DTPa es una versin ms segura de una vacuna anterior denominada DTP. La DTP se ha dejado de [employer]. QUIN DEBE RECIBIR ESTA VACUNA Y CUNDO? Los nios deberan recibir 5 dosis de la vacuna DTPa, una dosis en cada una de las siguientes edades:  2 meses.   4 meses.   6 meses.   15 a 18 meses.   4 a 6 aos.  La vacuna DTPa puede darse en simultneo con otras vacunas. ALGUNOS NIOS NO DEBERAN DARSE LA VACUNA DTPA O DEBERAN ESPERAR  Aquellos nios con trastornos menores, tales como resfros, pueden ser vacunados, pero aquellos con trastornos moderados a graves deberan esperar hasta su recuperacin para recibir la vacuna DTPa.   Cualquier nio que haya tenido una reaccin alrgica grave luego de una dosis de DTPa no debera recibir otra dosis.   Cualquier nio que haya sufrido una enfermedad cerebral o del sistema nervioso luego de [address]  [address] de haber recibido una dosis de la vacuna DTPa, no debera recibir otra dosis.   Hable con el mdico si el nio:   Ha tenido convulsiones o sufri un colapso luego de una dosis de DTPa.   Ha llorado sin parar durante 3 horas o ms luego de una dosis de DTPa.   Ha tenido fiebre mayor a 105 F (40.6 C) luego de una dosis de DTPa.   Pida ms informacin al profesional que lo asiste. Algunos de estos nios podrn recibir una vacuna que no protege para la tos [place], [place] DT.  NIOS DE MAYOR EDAD Y ADULTOS  La vacuna DTPa no se administra en adolescentes, adultos, o nios mayores a los 7 aos de [place].   Sin embargo, Therapist, art an  requieren proteccin. Existe una vacuna llamada Tdap, que es similar a la DTPa. Se recomienda una dosis nica de Tdap en personas desde los 11 a los 64 aos de [place]. Otra vacuna, llamada Td, provee  proteccin contra el ttanos y la difteria, pero no contra la tos [place]. Se recomienda su aplicacin cada 10 aos.  CULES SON LOS RIESGOS DE LA VACUNA DTPA?  Enfermarse de difteria, ttanos o pertusis es mucho ms peligroso que recibir la vacuna DTPa.   Sin embargo, una [place], como cualquier otro medicamento, puede causar problemas serios, como Therapist, art graves. El riesgo de que la vacuna DTPa cause daos graves o la muerte es extremadamente pequeo.  Problemas leves (comunes)  Fiebre (en hasta 1 de cada 4 nios).   Enrojecimiento o inflamacin en el lugar en el que se dio la inyeccin (en hasta 1 de cada 4 nios).   Dolor o sensibilidad en Immunologist en el que se dio la inyeccin (en hasta 1 de cada 4 nios).  Estos problemas ocurren ms a menudo luego de la cuarta y [place] dosis de vacuna DTPa que en las dosis anteriores. En ocasiones luego de la cuarta o quinta dosis se observa la inflamacin de la pierna o brazo completo en que se ha dado la inyeccin, y puede durar de 1 a 7 das (en hasta 1 nio de cada 30). Otros problemas leves incluyen:  Irritabilidad (en hasta 1 de cada 3 nios).   Cansancio o falta de apetito (en hasta 1 de cada 10 nios).   Vmitos (en hasta 1 de cada 50 nios).  Estos problemas ocurren generalmente de 1 a 3 das luego de la inyeccin. Problemas moderados (poco frecuentes)  Convulsiones (sacudones o fijacin de la mirada) (en hasta 1 de cada 14.000 nios).   Llanto sin parar durante 3 horas o ms (en hasta 1 nio de cada 1.000).   Fiebre alta, mayor a 105 F (40.6 C) (alrededor de 1 nio cada 16.000).  Problemas graves (muy raros)  Automotive engineer grave (menos de 1 por milln de dosis).   Se han informado varios otros problemas graves luego de la aplicacin de la vacuna DTPa. Estos incluyen:   Convulsiones a largo plazo, coma, o reduccin de la conciencia.   Dao permanente al cerebro.  Estos son casos tan poco frecuentes que resulta  difcil saber si fueron provocados por la vacuna. Controlar la fiebre es particularmente importante para los nios que han tenido convulsiones, por cualquier motivo. Tambin es importante si otro miembro de la familia ha tenido convulsiones. Puede reducir la fiebre y el dolor dando al nio un analgsico sin aspirina al recibir la vacuna, y durante las siguientes 24 horas, segn las instrucciones del [place]. QU PASA SI HAY UNA REACCIN MODERADA O GRAVE? A qu debo prestar atencin? Cualquier cosa extraa o poco comn, como una reaccin alrgica, fiebre alta o comportamiento extrao. Es muy poco comn que ocurran reacciones alrgicas graves con cualquier vacuna. Si se produjera una, sera dentro de los primeros minutos hasta algunas horas luego de la inyeccin. Podr observar dificultad para respirar, ronquera o silbidos al respirar, ronchas, palidez, debilidad, frecuencia cardaca elevada, o mareos. Si ocurrieran fiebre o convulsiones, normalmente sera dentro de la primera semana luego de la inyeccin. Qu debo hacer?  Comunquese con el mdico o lleve inmediatamente a la persona a un mdico.   Diga al mdico lo que ocurri, la fecha y hora en que ocurri, y cundo recibi la vacuna.   Pida al  mdico, enfermera, o al servicio de salud que complete el informe [employer] efectos adversos de la vacuna (Vaccine Adverse Event Reporting System, VAERS). O, bien puede completar el informe a travs del sitio web de VAERS en www.[email or web address].LAgents.no o llamando al [phone].  VAERS no proporciona consejos mdicos. EL PROGRAMA NACIONAL DE COMPENSACIN POR LESIONES CAUSADAS POR VACUNAS (NATIONAL VACCINE INJURY COMPENSATION PROGRAM)  En el raro caso en que usted o su hijo hayan tenido una reaccin grave a Cathleen Corti, se ha creado un programa federal para ayudarlo a Network engineer atencin de los lesionados.   Para obtener detalles acerca del [place] de Compensacin por Lesiones Causadas por [place],  llame al [phone] o visite el sitio web del programa en SpiritualWord.at  CMO OBTENER MS INFORMACIN?  Consulte con el profesional que lo asiste. Podr darle el prospecto de la vacuna o sugerirle otras fuentes de informacin.   Llame al programa de vacunacin del departamento de salud local o estatal.   Comunquese con los Centers for [employer] and Prevention (Centros para el control y la prevencin de enfermedades, CDC).   Llame al [phone] (1-800-CDC-INFO).   Visite el sitio web del [employer] de [place], en [email or web address].uy  CDC Diphtheria, Tetanus, and Pertussis-Spanish VIS (08/06/05) Document Released: 06/05/2008 Document Revised: 02/26/2011 [facility] Patient Information 2012 [place], [place].

## 2011-11-06 NOTE — Progress Notes (Signed)
Natalie Martinez September 14, 1965 [id number]   History:    46 y.o.  for annual gyn exam who had stated that her primary physician had put her on Lipitor 20 mg daily last year for hypercholesterolemia but she has not followed up with him. 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  Patient is doing well asymptomatic with the exception of concern about her weight. Patient frequently does her self breast examination her mammogram was normal this year. She is taking calcium and vitamin D on regular basis.     Past medical history,surgical history, family history and social history were all reviewed and documented in the EPIC chart.  Gynecologic History No LMP recorded. Patient has had a hysterectomy. Contraception: Prior hysterectomy Last Pap: See above. Results were: See above Last mammogram: 2013. Results were: normal  Obstetric History OB History    Grav Para Term Preterm Abortions TAB SAB Ect Mult Living   3 3 3       3      # Outc Date GA Lbr Len/2nd Wgt Sex Del Anes PTL Lv   1 TRM     M SVD  No Yes   2 TRM     F SVD  No No   3 TRM     F SVD  No Yes       ROS: A ROS was performed and pertinent positives and negatives are included in the history.  GENERAL: No fevers or chills. HEENT: No change in vision, no earache, sore throat or sinus congestion. NECK: No pain or stiffness. CARDIOVASCULAR: No chest pain or pressure. No palpitations. PULMONARY: No shortness of breath,  cough or wheeze. GASTROINTESTINAL: No abdominal pain, nausea, vomiting or diarrhea, melena or bright red blood per rectum. GENITOURINARY: No urinary frequency, urgency, hesitancy or dysuria. MUSCULOSKELETAL: No joint or muscle pain, no back pain, no recent trauma. DERMATOLOGIC: No rash, no itching, no lesions. ENDOCRINE: No polyuria, polydipsia, no heat or cold intolerance. No recent change in weight. HEMATOLOGICAL: No anemia or easy bruising or bleeding. NEUROLOGIC: No headache, seizures, numbness, tingling or weakness. PSYCHIATRIC: No depression, no loss of interest in normal activity or change in sleep pattern.     Exam: chaperone present  BP 120/74  Ht 5' 0.5" (1.537 m)  Wt 160 lb (72.576 kg)  BMI 30.73 kg/m2  Body mass index is 30.73 kg/(m^2).  General appearance : Well developed well nourished female. No acute distress HEENT: Neck supple, trachea midline, no carotid bruits, no thyroidmegaly Lungs: Clear to auscultation, no rhonchi or wheezes, or rib retractions  Heart: Regular rate and rhythm, no murmurs or gallops Breast:Examined in sitting and supine position were symmetrical in appearance, no palpable masses or tenderness,  no skin retraction, no nipple inversion, no nipple discharge, no skin discoloration, no axillary or supraclavicular lymphadenopathy Abdomen: no palpable masses or tenderness, no rebound or guarding Extremities: no edema or skin discoloration or tenderness  Pelvic:  Bartholin, Urethra, Skene Glands: Within normal limits             Vagina: No gross lesions or discharge  Cervix:  Absent Uterus  absent  Adnexa  Without masses or tenderness  Anus and perineum  normal   Rectovaginal  normal sphincter tone without palpated masses or tenderness             Hemoccult not done     Assessment/Plan:  46 y.o. female for annual exam presented to the office for her annual gynecological examination who is status post CO2 laser ablation of vaginal dysplasia (VIN-I) and  condyloma acuminatum of the vaginal cuff in January of this year. Pap smear was done today. Since she is not fasting and has history of hypercholesterolemia she will return back to the office next week in a fasting state to obtain the following lab tests: Comprehensive metabolic panel, fasting lipid profile, TSH, and urinalysis. She has not received the D. tab vaccine and will be administered today she was counseled and literature information was provided all questions rancher will follow accordingly. Literature information on exercise and cholesterol lowering diet was provided. All the above was discussed Spanish and we'll follow accordingly.    Ok Edwards MD, 4:10 PM 11/06/2011

## 2011-11-13 ENCOUNTER — Other Ambulatory Visit: Payer: BC Managed Care – PPO

## 2011-11-13 DIAGNOSIS — Z833 Family history of diabetes mellitus: Secondary | ICD-10-CM

## 2011-11-13 DIAGNOSIS — R635 Abnormal weight gain: Secondary | ICD-10-CM

## 2011-11-13 DIAGNOSIS — E7800 Pure hypercholesterolemia, unspecified: Secondary | ICD-10-CM

## 2011-11-13 DIAGNOSIS — Z01419 Encounter for gynecological examination (general) (routine) without abnormal findings: Secondary | ICD-10-CM

## 2011-11-13 LAB — LIPID PANEL
Cholesterol: 177 mg/dL (ref 0–200)
Total CHOL/HDL Ratio: 5.2 Ratio
Triglycerides: 127 mg/dL (ref <150)

## 2011-11-13 LAB — CBC WITH DIFFERENTIAL/PLATELET
Basophils Absolute: 0 10*3/uL (ref 0.0–0.1)
Basophils Relative: 0 % (ref 0–1)
HCT: 37.5 % (ref 36.0–46.0)
Hemoglobin: 13.2 g/dL (ref 12.0–15.0)
Lymphocytes Relative: 35 % (ref 12–46)
MCHC: 35.2 g/dL (ref 30.0–36.0)
Monocytes Relative: 6 % (ref 3–12)
Neutro Abs: 2.9 10*3/uL (ref 1.7–7.7)
Neutrophils Relative %: 55 % (ref 43–77)
WBC: 5.3 10*3/uL (ref 4.0–10.5)

## 2011-11-13 LAB — COMPREHENSIVE METABOLIC PANEL
Albumin: 4.2 g/dL (ref 3.5–5.2)
BUN: 11 mg/dL (ref 6–23)
Calcium: 9.4 mg/dL (ref 8.4–10.5)
Chloride: 105 mEq/L (ref 96–112)
Creat: 0.64 mg/dL (ref 0.50–1.10)
Glucose, Bld: 80 mg/dL (ref 70–99)
Potassium: 4.3 mEq/L (ref 3.5–5.3)

## 2011-11-13 LAB — TSH: TSH: 2.04 u[IU]/mL (ref 0.350–4.500)

## 2012-02-04 ENCOUNTER — Ambulatory Visit (INDEPENDENT_AMBULATORY_CARE_PROVIDER_SITE_OTHER): Payer: BC Managed Care – PPO | Admitting: Emergency Medicine

## 2012-02-04 VITALS — BP 118/80 | HR 69 | Temp 97.9°F | Resp 18 | Wt 160.0 lb

## 2012-02-04 DIAGNOSIS — S239XXA Sprain of unspecified parts of thorax, initial encounter: Secondary | ICD-10-CM

## 2012-02-04 MED ORDER — NAPROXEN SODIUM 550 MG PO TABS: 550.0000 mg | ORAL_TABLET | Freq: Two times a day (BID) | ORAL | Status: DC

## 2012-02-04 MED ORDER — CYCLOBENZAPRINE HCL 10 MG PO TABS: 10.0000 mg | ORAL_TABLET | Freq: Three times a day (TID) | ORAL | Status: DC | PRN

## 2012-02-04 NOTE — Progress Notes (Signed)
[facility] [address] [place] [address] [id number]  Date:  02/04/2012   Name:  Natalie Martinez   DOB:  31-Aug-1965   MRN:  [id number]  PCP:  No primary provider on file.    Chief Complaint: Back Pain   History of Present Illness:  Natalie Martinez is a 46 y.o. very pleasant female patient who presents with the following:  2 day history of right mid back pain that is worsened by lifting and bending.  No history of direct injury.  Taking zumba classes.  Lifts and bends at work.  No history of direct injury.  Non radiating.  No numbness or weakness. Pain relieved by; advil.  No Pulmonary, GI or GU symptoms.    Patient Active Problem List  Diagnosis  . HYPERLIPIDEMIA  . CONSTIPATION  . ACUTE CYSTITIS  . MICROSCOPIC HEMATURIA  . Hypercholesterolemia  . VAIN I (vaginal intraepithelial neoplasia grade I)  . Condyloma acuminatum in female  . Weight gain    Past Medical History  Diagnosis Date  . NSVD (normal spontaneous vaginal delivery)     X3  . Hypercholesterolemia     Past Surgical History  Procedure Date  . Cervical biopsy  w/ loop electrode excision 2004  &  2005    X 2  . Dilation and curettage of uterus 2000 & 2003  . Vaginal hysterectomy 2005    RECURRENT CERVICAL DYSPLASIA    History  Substance Use Topics  . Smoking status: Never Smoker   . Smokeless tobacco: Never Used  . Alcohol Use: No    Family History  Problem Relation Age of Onset  . Diabetes Mother     No Known Allergies  Medication list has been reviewed and updated.  Current Outpatient Prescriptions on File Prior to Visit  Medication Sig Dispense Refill  . simvastatin (ZOCOR) 20 MG tablet Take 20 mg by mouth every evening.        Review of Systems:  As per HPI, otherwise negative.    Physical Examination: Filed Vitals:   02/04/12 1634  BP: 118/80  Pulse: 69  Temp: 97.9 F (36.6 C)  Resp: 18   Filed Vitals:   02/04/12 1634  Weight: 160 lb  (72.576 kg)   There is no height on file to calculate BMI. Ideal Body Weight:     GEN: WDWN, NAD, Non-toxic, Alert & Oriented x 3 HEENT: Atraumatic, Normocephalic.  Ears and Nose: No external deformity. Chest:  CTA  EXTR: No clubbing/cyanosis/edema NEURO: Normal gait.  PSYCH: Normally interactive. Conversant. Not depressed or anxious appearing.  Calm demeanor.  BACK:  Tender right mid back inferior and lateral to scapula tip.  No crepirus  Assessment and Plan: Back strain Anaprox Flexeril Local heat Follow up if not improved  Carmelina Dane, MD

## 2012-02-12 NOTE — Progress Notes (Signed)
Reviewed and agree.

## 2012-03-15 ENCOUNTER — Ambulatory Visit (INDEPENDENT_AMBULATORY_CARE_PROVIDER_SITE_OTHER): Payer: BC Managed Care – PPO | Admitting: Gynecology

## 2012-03-15 ENCOUNTER — Encounter: Payer: Self-pay | Admitting: Gynecology

## 2012-03-15 VITALS — BP 106/62

## 2012-03-15 DIAGNOSIS — N898 Other specified noninflammatory disorders of vagina: Secondary | ICD-10-CM

## 2012-03-15 DIAGNOSIS — Z113 Encounter for screening for infections with a predominantly sexual mode of transmission: Secondary | ICD-10-CM

## 2012-03-15 DIAGNOSIS — A499 Bacterial infection, unspecified: Secondary | ICD-10-CM

## 2012-03-15 DIAGNOSIS — N760 Acute vaginitis: Secondary | ICD-10-CM

## 2012-03-15 DIAGNOSIS — R300 Dysuria: Secondary | ICD-10-CM

## 2012-03-15 LAB — URINALYSIS W MICROSCOPIC + REFLEX CULTURE
Crystals: NONE SEEN
Leukocytes, UA: NEGATIVE
Nitrite: NEGATIVE
Specific Gravity, Urine: 1.015 (ref 1.005–1.030)
Urobilinogen, UA: 0.2 mg/dL (ref 0.0–1.0)

## 2012-03-15 LAB — WET PREP FOR TRICH, YEAST, CLUE: WBC, Wet Prep HPF POC: NONE SEEN

## 2012-03-15 MED ORDER — CLINDAMYCIN PHOSPHATE (1 DOSE) 2 % VA CREA: TOPICAL_CREAM | VAGINAL | Status: DC

## 2012-03-15 NOTE — Patient Instructions (Signed)
Vaginosis bacteriana (Bacterial Vaginosis) La vaginosis bacteriana es una infeccin vaginal en la que el equilibrio normal de las bacterias de la vagina se modifica. Este equilibrio normal se ve afectado por un desarrollo excesivo de ciertas bacterias. Hay diferentes tipos de bacteria que causan la vaginosis bacteriana. Es el problema vaginal ms comn en las mujeres de edad frtil. CAUSAS  La causa de este trastorno no se conoce bien. Se produce como consecuencia de un aumento o desequilibrio de las bacterias nocivas.  Algunas actividades o conductas pueden poner en peligro el equilibrio normal de las bacterias en la vagina, y aumentar el riesgo. Entre ellas:  Tener un compaero sexual o mltiples compaeros sexuales.  Las duchas vaginales  Usar un dispositivo intrauterino (DIU) como mtodo anticonceptivo.  No se conoce el papel que juega la actividad sexual en el desarrollo de una VB. Sin embargo, las mujeres que nunca tuvieron relaciones sexuales raramente se infectan. El contagio no se produce en asientos de baos, camas, piscinas o por tocar objetos.  SNTOMAS  Flujo vaginal grisceo.  Olor parecido al pescado con la secrecin, en especial despus de tener relaciones sexuales.  Picazn o irritacin de la vagina y la vulva.  Ardor o dolor al orinar.  Algunas mujeres no presentan ningn sntoma. DIAGNSTICO El mdico realizar un examen vaginal para diagnosticar una vaginosis bacteriana. El mdico le indicar anlisis de laboratorio y observar las muestras del lquido vaginal en el microscopio. Buscar bacterias y clulas anormales (clulas clave), pH mayor a 4.5 y una prueba de aminas positivo, todos ellos asociados al BV.  RIESGOS Y COMPLICACIONES  Enfermedad plvica inflamatoria (EPI).  Infecciones luego de una ciruga ginecolgica.  VIH.  Virus del Herpes TRATAMIENTO En algunos casos, la infeccin desaparece sin tratamiento. Sin embargo, todas las mujeres con sntomas  de VB deben tratarse para evitar complicaciones, especialmente si se ha planificado una ciruga ginecolgica. Los compaeros varones generalmente no necesitan tratamiento. Sin embargo, puede contagiarse entre parejas femeninas, de modo que el tratamiento se realiza para evitar recurrencias.   La VB puede tratarse con medicamentos que destruyen grmenes (antibiticos). Estos se presentan en pldoras o en cremas vaginales. Tanto mujeres embarazadas como no embarazadas pueden usar ambos, pero se indican en dosis diferentes. Estos antibiticos no daan al beb.  La VB puede recurrir luego del tratamiento. Si esto ocurre, se prescribir un segundo tratamiento con antibiticos.  El tratamiento es importante en el caso de las mujeres embarazadas. Si no se trata, la VB puede causar parto prematuro, especialmente en una mujer que ha tenido un parto prematuro en el pasado. Todas las mujeres embarazadas que tienen sntomas de VB deben ser controladas y tratadas.  En los casos de recurrencia crnica, se prescribe un tratamiento con un gel vaginal dos veces por semana INSTRUCCIONES PARA EL CUIDADO DOMICILIARIO  Tome los medicamentos que le indic el mdico.  No mantenga relaciones sexuales hasta completar el tratamiento.  Comunique a sus compaeros sexuales que sufre una infeccin vaginal. Ellos deben concurrir para un control mdico si tienen problemas como una urticaria leve o picazn.  Practique el sexo seguro. Use preservativos. Tenga un solo compaero sexual. PREVENCIN Algunos pasos bsicos de prevencin pueden ayudar a reducir el riesgo de desequilibrio de las bacterias vaginales y de sufrir VB.  No mantener relaciones sexuales (abstinencia)  No utilice duchas vaginales.  Utilice todos los medicamentos que le han prescripto para el tratamiento, aunque los sntomas hayan desaparecido.  Comunique a su compaero sexual que sufre una VB. De ese modo   podr tratase, si es necesario, y podr evitar una  recurrencia. SOLICITE ATENCIN MDICA SI:  Los sntomas no mejoran luego de 3 das de tratamiento.  Aumentan la secrecin, el dolor o la fiebre. ASEGRESE QUE:   Comprende estas instrucciones.  Controlar su enfermedad.  Solicitar ayuda de inmediato si no mejora o empeora. PARA MS INFORMACIN: Division de STD Prevention (DSTDP), Centers for Disease Control and Prevention (Centros para el control y la prevencin de enfermedades, CDC): www.cdc.gov/std American Social Health Association (ASHA): www.ashastd.org  Document Released: 06/16/2007 Document Revised: 06/01/2011 Exit[employer] Patient Information 2013 [employer].  

## 2012-03-15 NOTE — Progress Notes (Signed)
Patient presented to the office today stating that she had not been sexually active for quite some time and became sexually active last week and use condoms. The following day she did not use any form of contraception. She became irritated and with Demerol like sensation in the vagina and the slight discharge with no odor. Patient with prior history of vaginal hysterectomy for recurrent dysplasia. Review of her record indicated that August of 2013 her Pap smear had demonstrated ascus with high-risk HPV.  Exam: Bartholin urethra Skene was within normal limits Vagina: Slight discharge with no odor clear like. Vaginal cuff intact Bimanual exam not done Rectal exam not done  GC and Chlamydia culture obtained results pending at time of this dictation Wet prep demonstrated few clue cells and too numerous to count bacteria  Urinalysis 7-10 rbc, few bacteria urine sent for culture  Assessment for/plan: Patient with clinical appearance of bacterial vaginosis will be placed on Clindesse vaginal to apply tonight. We'll wait for the results of the GC, Chlamydia and urine culture. She will schedule an appointment for next month for colposcopy due to her recent abnormal Pap smear.

## 2012-03-17 LAB — URINE CULTURE
Colony Count: NO GROWTH
Organism ID, Bacteria: NO GROWTH

## 2012-03-17 LAB — GC/CHLAMYDIA PROBE AMP: CT Probe RNA: NEGATIVE

## 2012-03-18 ENCOUNTER — Other Ambulatory Visit: Payer: Self-pay | Admitting: *Deleted

## 2012-03-18 DIAGNOSIS — R319 Hematuria, unspecified: Secondary | ICD-10-CM

## 2012-03-19 LAB — POC TROPONIN: Troponin-I: 0 ng/ml (ref 0.00–0.07)

## 2012-03-20 NOTE — ED Provider Notes (Signed)
[facility]  EMERGENCY DEPARTMENT TREATMENT REPORT  NAME:  [name] [name], [name] [name]  SEX:   F  ADMIT: 03/19/2012  DOB:   [birth year]  MR#    [id number]  ROOM:    TIME SEEN: 06 50 PM  ACCT#  [id number]    cc: Milford Cage MD    PRIMARY CARE PHYSICIAN:  Milford Cage, MD    CHIEF COMPLAINT:  Shortness of breath, epistaxis.     HISTORY OF PRESENT ILLNESS:  This is a 46 year old female who  for  the past week has had shortness of   breath.  She feels as if she cannot get a deep breath in and has experienced   some   pressure to the anterior and posterior chest but her pain is not   pleuritic.  She has not had any significant sharp chest pain.  She denies any   coughing, wheezing or fever.  She was diagnosed with bronchitis earlier in the   year and has been using an inhaler with minimal relief.  No history of PE or   DVT.  No travel or hormone use.  Denies calf pain or leg swelling.  She has no   coronary artery disease  history nor does she have a family history of this.    She is a nonsmoker.    REVIEW OF SYSTEMS:  CONSTITUTIONAL:  No fever or chills.  ENT:  No sore throat, runny nose, or other URI symptoms.   RESPIRATORY:  Shortness of breath.  No cough or wheezing.  CARDIOVASCULAR:  Chest pressure.  GASTROINTESTINAL:  No vomiting, diarrhea, or abdominal pain.   GENITOURINARY:  No dysuria, frequency, or urgency.   MUSCULOSKELETAL:  No joint pain or swelling.   INTEGUMENTARY:  No rashes.   NEUROLOGICAL:  No headaches, sensory or motor symptoms.     PAST MEDICAL HISTORY:  Dysfunctional uterine bleeding.    SOCIAL HISTORY:  No tobacco.    FAMILY HISTORY:  None.    ALLERGIES:  NONE.    MEDICATIONS:  None.    PHYSICAL EXAMINATION:  VITAL SIGNS:  Blood pressure 133/78, pulse 97, respirations 20, temperature   99.1, oxygen 98% on room air.  GENERAL APPEARANCE:  Patient appears well developed and well nourished.    Appearance and behavior are age and situation appropriate.    The patient in   no acute distress.   HEENT:  Eyes:  Conjunctivae clear, lids normal.  Pupils equal, symmetrical,   and normally reactive. Mouth/Throat:  Surfaces of the pharynx, palate, and   tongue are pink, moist, and without lesions.   NECK:  Supple, nontender.  LYMPHATICS: No cervical or submandibular lymphadenopathy palpated.   RESPIRATORY:  Clear and equal breath sounds.  No respiratory distress,   tachypnea, or accessory muscle use.   CARDIOVASCULAR:   Heart regular, without murmurs, gallops, rubs, or thrills.   CHEST:  Chest symmetrical without masses or tenderness.   GASTROINTESTINAL: Abdomen is soft and nontender.   MUSCULOSKELETAL: Stance and gait appear normal.   SKIN:  Warm and dry without rashes.      CONTINUATION BY JENNY WHITTINGTON, PA-C:    INITIAL ASSESSMENT AND MANAGEMENT PLAN:  This is a 46 year old female who has had constant shortness of breath and   chest pressure, over the past week, as well as epistaxis intermittently for   the past few days and that resolved.  We will obtain chest x-ray, EKG,   troponin, administers nebs and steroids.    DIAGNOSTIC STUDIES:  Chest x-ray negative, read by radiology.  EKG:  Normal sinus rhythm.  Dr. Ronne Binning did not see any acute S-T segment or T-wave abnormalities that are   consistent with acute ischemia or infarction. Troponin is negative.    COURSE:  The patient remained stable and ill-appearing throughout ED stay.  She was   given 60 mg oral prednisone and multiple breathing treatments.  Her breathing   did improve.  Again, this has been constant shortness of breath and chest   discomfort, over the past week, with a negative troponin.  We do not think it   is cardiac, at this time, and she is PERC rule negative.  She feels   comfortable being discharged home.  She will continue with her inhaler she   already has at home.  I have written a prescription for prednisone.  She will   need to see her primary care physician within the next several days.  The    patient was instructed to come back immediately if she develops any chest pain   or increasing shortness of breath.    FINAL DIAGNOSES:  Shortness of breath.    DISPOSITION:  The patient is discharged home in stable condition.      The patient is discharged home in stable condition, with instructions to   follow up with their regular doctor.  They are advised to return immediately   for any worsening or symptoms of concern.     The patient was personally evaluated by myself and Dr. Ronne Binning who   agrees with the above assessment and plan.      ___________________  Posey Pronto MD  Dictated By: Caprice Renshaw E. Mertie Moores, PA-C    My signature above authenticates this document and my orders, the final   diagnosis (es), discharge prescription (s), and instructions in the [facility]   Pulsecheck record.  JW  D:03/19/2012  T: 03/20/2012 45:40:98  [id number]  Authenticated by Posey Pronto, M.D. On 03/23/2012 11:22:02 AM

## 2012-03-21 ENCOUNTER — Other Ambulatory Visit: Payer: BC Managed Care – PPO

## 2012-03-21 DIAGNOSIS — R319 Hematuria, unspecified: Secondary | ICD-10-CM

## 2012-03-21 LAB — POC HCG,URINE: HCG urine, QL: NEGATIVE

## 2012-03-22 LAB — URINALYSIS W MICROSCOPIC + REFLEX CULTURE
Bilirubin Urine: NEGATIVE
Crystals: NONE SEEN
Ketones, ur: NEGATIVE mg/dL
Protein, ur: NEGATIVE mg/dL
Specific Gravity, Urine: 1.012 (ref 1.005–1.030)
Urobilinogen, UA: 0.2 mg/dL (ref 0.0–1.0)

## 2012-04-06 ENCOUNTER — Encounter: Payer: Self-pay | Admitting: Gynecology

## 2012-04-06 ENCOUNTER — Ambulatory Visit (INDEPENDENT_AMBULATORY_CARE_PROVIDER_SITE_OTHER): Payer: BC Managed Care – PPO | Admitting: Gynecology

## 2012-04-06 VITALS — BP 120/72

## 2012-04-06 DIAGNOSIS — R6889 Other general symptoms and signs: Secondary | ICD-10-CM

## 2012-04-06 DIAGNOSIS — IMO0001 Reserved for inherently not codable concepts without codable children: Secondary | ICD-10-CM | POA: Insufficient documentation

## 2012-04-06 NOTE — Progress Notes (Signed)
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 2013 (December) Pap smear atypical cells of undetermined significance with high-risk HPV and and  On 04/17/2011 patient underwent CO2 laser ablation of vaginal dysplasia (vain 1) and condyloma acuminatum of the vaginal cuff.Patient underwent a detail colposcopic evaluation today. The external genitalia, perineum, perirectal region with no gross lesions noted even after application of acetic acid. Vaginal speculum was introduced and a systematic inspection of the entire vagina and vaginal cuff did not demonstrate any lesions either before or after application of the acetic acid.  Patient will return back to the office for followup Pap smear in 6 months and will obtain HPV subtype. Patient also requesting her prescription refill for her simvastatin 20 mg daily that her primary physician had placed her on for hyperlipidemia. We discussed her recent lipid profile as can be seen in the epic lab section.

## 2012-04-08 ENCOUNTER — Other Ambulatory Visit: Payer: Self-pay | Admitting: Gynecology

## 2012-04-08 ENCOUNTER — Telehealth: Payer: Self-pay

## 2012-04-08 MED ORDER — SIMVASTATIN 20 MG PO TABS: 20.0000 mg | ORAL_TABLET | Freq: Every evening | ORAL | Status: DC

## 2012-04-08 NOTE — Telephone Encounter (Signed)
Patient was in 04/06/12 and said you were to prescribe Rx.  Has been to pharmacy twice but not there. Asked if we could call it in.  I see in your note you mentioned she asked about getting Simvastatin refill.

## 2012-04-08 NOTE — Telephone Encounter (Signed)
Simvastatin l refill for 6 months okay.

## 2012-05-19 ENCOUNTER — Other Ambulatory Visit: Payer: Self-pay | Admitting: Gynecology

## 2012-05-19 ENCOUNTER — Other Ambulatory Visit: Payer: Self-pay

## 2012-05-19 DIAGNOSIS — Z1231 Encounter for screening mammogram for malignant neoplasm of breast: Secondary | ICD-10-CM

## 2012-06-08 NOTE — ED Provider Notes (Signed)
[facility]  EMERGENCY DEPARTMENT TREATMENT REPORT  NAME:  [name] [name], [name] [name]  SEX:   F  ADMIT: 06/08/2012  DOB:   [birth year]  MR#    [id number]  ROOM:    TIME SEEN: 12 22 PM  ACCT#  [id number]        CHIEF COMPLAINT:  Wheezing.    HISTORY OF PRESENT ILLNESS:  This is a 47 year old female who presents with wheezing that started today.    Used her inhaler with no significant relief.  The patient denies any fever.    She has had some sinus symptoms for the last few days and has been on   amoxicillin.    REVIEW OF SYSTEMS:  CONSTITUTIONAL:  No fever.  ENT:  Positive congestion.  RESPIRATORY:  Positive wheezing.  CARDIOVASCULAR:  No chest pain.  GASTROINTESTINAL:  No vomiting.    PAST MEDICAL HISTORY:  States new diagnosis of asthma since January, referred to a pulmonologist,   first appointment is in April.    MEDICATIONS:  Currently on amoxicillin for a sinus infection.    FAMILY HISTORY:  Noncontributory.    SOCIAL HISTORY:  Denies tobacco.    PHYSICAL EXAMINATION:  VITAL SIGNS:  Blood pressure 122/89, pulse 93, respirations 18, temperature   97.2, O2 sat 97%.  GENERAL:  Well-developed, well-nourished female, alert.  HEENT:  Eyes:  Conjunctivae clear, lids normal.  Pupils equal, symmetrical,   and normally reactive.  No edema, no erythema of pharynx.  NECK:  Supple, no mass palpable, no lymphadenopathy.  HEART:  Normal sinus rhythm, no murmurs heard.  LUNGS:  The patient had received nebulizer treatment.  Her lungs are clear, no   wheezing, no retractions.  She is feeling better.    EMERGENCY DEPARTMENT COURSE:    Prednisone p.o. given, nebulizer, albuterol and Atrovent given in the ED.    FINAL DIAGNOSIS:  Exacerbation of asthma.    DISPOSITION AND PLAN:  The patient is discharged.  We will place her on prednisone, dose given p.o.   in the ED, and she is to continue her inhaler.  Follow up with a   pulmonologist.  The patient evaluated by myself and Dr. Victorino Dike Himmel-Sakari Raisanen    who agrees with the above assessment and plan.      ___________________  Liberty Handy Himmel-Jandi Swiger DO  Dictated By: Windell Hummingbird. Houle, PA    My signature above authenticates this document and my orders, the final   diagnosis (es), discharge prescription (s), and instructions in the [facility]   Pulsecheck record.  SC  D:06/08/2012  T: 06/08/2012 12:37:46  [id number]  Authenticated by Carolin Guernsey, DO On 06/17/2012 09:13:49 AM

## 2012-06-09 ENCOUNTER — Ambulatory Visit: Payer: Self-pay

## 2012-06-09 ENCOUNTER — Other Ambulatory Visit: Payer: Self-pay | Admitting: Occupational Medicine

## 2012-06-09 DIAGNOSIS — R52 Pain, unspecified: Secondary | ICD-10-CM

## 2012-06-15 ENCOUNTER — Encounter

## 2012-06-16 ENCOUNTER — Ambulatory Visit
Admission: RE | Admit: 2012-06-16 | Discharge: 2012-06-16 | Disposition: A | Discharge status: Home / Self Care | Payer: BC Managed Care – PPO | Source: Ambulatory Visit

## 2012-06-16 DIAGNOSIS — Z1231 Encounter for screening mammogram for malignant neoplasm of breast: Secondary | ICD-10-CM

## 2012-07-11 ENCOUNTER — Ambulatory Visit (INDEPENDENT_AMBULATORY_CARE_PROVIDER_SITE_OTHER): Payer: BC Managed Care – PPO | Admitting: Physician Assistant

## 2012-07-11 VITALS — BP 102/64 | HR 69 | Temp 98.6°F | Resp 16 | Ht 61.0 in | Wt 153.0 lb

## 2012-07-11 DIAGNOSIS — J309 Allergic rhinitis, unspecified: Secondary | ICD-10-CM

## 2012-07-11 MED ORDER — CETIRIZINE HCL 10 MG PO TABS: 10.0000 mg | ORAL_TABLET | Freq: Every day | ORAL | Status: DC

## 2012-07-11 MED ORDER — AZELASTINE HCL 0.05 % OP SOLN: 1.0000 [drp] | Freq: Two times a day (BID) | OPHTHALMIC | Status: DC

## 2012-07-11 MED ORDER — FLUTICASONE PROPIONATE 50 MCG/ACT NA SUSP: 2.0000 | Freq: Every day | NASAL | Status: DC

## 2012-07-11 MED ORDER — DIPHENHYDRAMINE HCL 25 MG PO TABS: 25.0000 mg | ORAL_TABLET | Freq: Every evening | ORAL | Status: DC | PRN

## 2012-07-11 NOTE — Patient Instructions (Addendum)
Begin using the fluticasone (Flonase) daily.  Continue taking Zyrtec every morning.  Take diphenhydramine (Benadryl) to help with night time symptoms.  Use the Optivar drops daily to help with itchy eyes.  If any symptoms are worsening or not improving, please let me know.   Allergic Rhinitis Allergic rhinitis is when the mucous membranes in the nose respond to allergens. Allergens are particles in the air that cause your body to have an allergic reaction. This causes you to release allergic antibodies. Through a chain of events, these eventually cause you to release histamine into the blood stream (hence the use of antihistamines). Although meant to be protective to the body, it is this release that causes your discomfort, such as frequent sneezing, congestion and an itchy runny nose.  CAUSES  The pollen allergens may come from grasses, trees, and weeds. This is seasonal allergic rhinitis, or "hay fever." Other allergens cause year-round allergic rhinitis (perennial allergic rhinitis) such as house dust mite allergen, pet dander and mold spores.  SYMPTOMS   Nasal stuffiness (congestion).  Runny, itchy nose with sneezing and tearing of the eyes.  There is often an itching of the mouth, eyes and ears. It cannot be cured, but it can be controlled with medications. DIAGNOSIS  If you are unable to determine the offending allergen, skin or blood testing may find it. TREATMENT   Avoid the allergen.  Medications and allergy shots (immunotherapy) can help.  Hay fever may often be treated with antihistamines in pill or nasal spray forms. Antihistamines block the effects of histamine. There are over-the-counter medicines that may help with nasal congestion and swelling around the eyes. Check with your caregiver before taking or giving this medicine. If the treatment above does not work, there are many new medications your caregiver can prescribe. Stronger medications may be used if initial measures are  ineffective. Desensitizing injections can be used if medications and avoidance fails. Desensitization is when a patient is given ongoing shots until the body becomes less sensitive to the allergen. Make sure you follow up with your caregiver if problems continue. SEEK MEDICAL CARE IF:   You develop fever (more than 100.5 F (38.1 C).  You develop a cough that does not stop easily (persistent).  You have shortness of breath.  You start wheezing.  Symptoms interfere with normal daily activities. Document Released: 12/02/2000 Document Revised: 06/01/2011 Document Reviewed: 06/13/2008 [facility] Patient Information 2013 [place], [place].

## 2012-07-11 NOTE — Progress Notes (Signed)
  Subjective:    Patient ID: Natalie Martinez, female    DOB: [birth year], 47 y.o.   MRN: [id number]  HPI   Ms.  Sherrard is a pleasant 47 yr old female here complaining of allergies.  Feels like she is allergic to pollen.  This happens around this time every year.  She is experiencing nasal congestion, sore throat, itchy eyes, and cough.  Having trouble sleeping due to nasal congestion.  Has been taking Zyrtec daily but it doesn't seem to be helping.  Pt wonders if there is a shot she could get today to help with symptoms.     Review of Systems  Constitutional: Negative for fever and chills.  HENT: Positive for congestion, sore throat, rhinorrhea and postnasal drip. Negative for ear pain.   Eyes: Positive for redness and itching.  Respiratory: Positive for cough. Negative for shortness of breath and wheezing.   Cardiovascular: Negative.   Gastrointestinal: Negative.   Musculoskeletal: Negative.   Allergic/Immunologic: Positive for environmental allergies.  Neurological: Negative.        Objective:   Physical Exam  Vitals reviewed. Constitutional: She is oriented to person, place, and time. She appears well-developed and well-nourished. No distress.  HENT:  Head: Normocephalic and atraumatic.  Right Ear: Tympanic membrane and ear canal normal.  Left Ear: Tympanic membrane and ear canal normal.  Nose: Mucosal edema and rhinorrhea present.  Mouth/Throat: Uvula is midline, oropharynx is clear and moist and mucous membranes are normal.  Eyes: EOM are normal. Pupils are equal, round, and reactive to light. Right eye exhibits no discharge and no exudate. Left eye exhibits no discharge and no exudate. Right conjunctiva is injected. Left conjunctiva is injected.  Neck: Neck supple.  Cardiovascular: Normal rate, regular rhythm and normal heart sounds.  Exam reveals no gallop and no friction rub.   No murmur heard. Pulmonary/Chest: Effort normal and breath sounds normal. She has no wheezes.  She has no rales.  Lymphadenopathy:    She has no cervical adenopathy.  Neurological: She is alert and oriented to person, place, and time.  Skin: Skin is warm and dry.  Psychiatric: She has a normal mood and affect. Her behavior is normal.     Filed Vitals:   07/11/12 1740  BP: 102/64  Pulse: 69  Temp: 98.6 F (37 C)  Resp: 16        Assessment & Plan:  Allergic rhinitis - Plan: cetirizine (ZYRTEC) 10 MG tablet, fluticasone (FLONASE) 50 MCG/ACT nasal spray, azelastine (OPTIVAR) 0.05 % ophthalmic solution, diphenhydrAMINE (BENADRYL) 25 MG tablet   [name] is a 47 yr old female with allergic rhinitis.  I do not think her symptoms warrant systemic steroids today.  Continue Zyrtec daily.  Add Flonase.  Add Benadryl QHS if needed for night time symptoms. Suspect she will only need Benadryl short term until Flonase takes full effect.  Optivar for eye symptoms.  If symptoms worsening or not improving, pt will RTC.

## 2012-10-10 NOTE — Patient Instructions (Signed)
MyChart Activation    Thank you for requesting access to MyChart. Please follow the instructions below to securely access and download your online medical record. MyChart allows you to send messages to your doctor, view your test results, renew your prescriptions, schedule appointments, and more.    How Do I Sign Up?    1. In your internet browser, go to [email or web address]  2. Click on the First Time User? Click Here link in the Sign In box. You will be redirect to the New Member Sign Up page.  3. Enter your MyChart Access Code exactly as it appears below. You will not need to use this code after you???ve completed the sign-up process. If you do not sign up before the expiration date, you must request a new code.    MyChart Access Code: [email or web address]  Expires: 01/08/2013  2:44 PM (This is the date your MyChart access code will expire)    4. Enter the last four digits of your Social Security Number (xxxx) and Date of Birth (mm/dd/yyyy) as indicated and click Submit. You will be taken to the next sign-up page.  5. Create a MyChart ID. This will be your MyChart login ID and cannot be changed, so think of one that is secure and easy to remember.  6. Create a MyChart password. You can change your password at any time.  7. Enter your Password Reset Question and Answer. This can be used at a later time if you forget your password.   8. Enter your e-mail address. You will receive e-mail notification when new information is available in MyChart.  9. Click Sign Up. You can now view and download portions of your medical record.  10. Click the Download Summary menu link to download a portable copy of your medical information.    Additional Information    If you have questions, please visit the Frequently Asked Questions section of the MyChart website at [email or web address] Remember, MyChart is NOT to be used for urgent needs. For medical emergencies, dial 911.

## 2012-10-10 NOTE — Progress Notes (Signed)
SUBJECTIVE:  This a 47 year old Hispanic female gravida 5 para 3 Ab2 who is previous hysterectomy presents for a well woman examination. Her only complaint is the fact that her cervical stump bleeds from time to time. She said that the bleeding is bright red and occurs sporadically throughout the month. She previously had a polyp on her cervix that was removed several years ago. The rest of her genitourinary review of systems was unremarkable. Past medical history was reviewed. She had a recent mammogram which was normal.[01]   OBJECTIVE:   Vital signs are stable.  She's well-developed obese Hispanic female in no apparent stress.  HEENT exam revealed no thyromegaly.  Breasts were soft with no palpable masses.  Abdomen was obese and nontender with a midline scar.  External genitalia normal.  Vaginal canal was unremarkable.  Cervical stump revealed a 2 cm polyp protruding from the cervical os. Polyp forceps were applied and the polyp was removed. The bed of the polyp was cauterized with silver nitrate.  Bimanual examination revealed no pelvic masses.02]  ASSESSMENT:  Well woman examination  Cervical polyp[03]  PLAN:  1.  She will followup in one year for next well woman exam.  2. Patient is aware the importance of diet and exercise.  3. I told patient that removal of the cervical stump is not indicated at this time.[04]

## 2012-10-12 NOTE — Telephone Encounter (Signed)
Pt aware of lab results. Rx sent to pharmacy.

## 2012-10-12 NOTE — Progress Notes (Signed)
Quick Note:    Flagyl 500mg bid, 14 pills, 1 refill  ______

## 2012-10-13 LAB — CHLAMYDIA/GC PCR
Chlamydia trachomatis, NAA: NEGATIVE
Neisseria gonorrhoeae, NAA: NEGATIVE

## 2012-11-07 ENCOUNTER — Encounter: Payer: Self-pay | Admitting: Gynecology

## 2012-11-07 ENCOUNTER — Ambulatory Visit (INDEPENDENT_AMBULATORY_CARE_PROVIDER_SITE_OTHER): Payer: BC Managed Care – PPO | Admitting: Gynecology

## 2012-11-07 VITALS — BP 120/72 | Ht 60.5 in | Wt 145.0 lb

## 2012-11-07 DIAGNOSIS — E785 Hyperlipidemia, unspecified: Secondary | ICD-10-CM

## 2012-11-07 DIAGNOSIS — I83893 Varicose veins of bilateral lower extremities with other complications: Secondary | ICD-10-CM | POA: Insufficient documentation

## 2012-11-07 DIAGNOSIS — Z01419 Encounter for gynecological examination (general) (routine) without abnormal findings: Secondary | ICD-10-CM

## 2012-11-07 NOTE — Patient Instructions (Addendum)
Venas varicosas  (Varicose Veins)  Las venas varicosas son venas que se han agrandado y se tornan sinuosas.   CAUSAS   Este trastorno es el resultado de un malfuncionamiento de las válvulas de las venas. Las válvulas de las venas ayudan al retorno de la sangre desde las piernas hacia el corazón. Si estas válvulas se dañan, el flujo sanguíneo retorna hacia atrás y vuelve hacia las venas de la pierna, cerca de la piel. Esto hace que las venas se agranden debido al aumento de la presión en su interior. Es más probable que desarrollen venas varicosas las personas que están de pie durante mucho tiempo, las mujeres embarazadas o quienes tienen sobrepeso.  SÍNTOMAS   · Venas hinchadas, tortuosas, y azuladas, que se observan más comúnmente en las piernas.  · Dolor en las piernas o sensación de pesadez. Estos síntomas pueden empeorar hacia el final del día.  · Hinchazón de las piernas.  · Cambios en el color de la piel.  DIAGNÓSTICO   Su médico puede diagnosticarle venas varicosas con un examen de las piernas. Podrá recomendarle una ecografía de las venas de sus piernas.   TRATAMIENTO   La mayoría de las venas varicosas pueden tratarse en casa. Sin embargo, existen otros tratamientos para personas con síntomas persistentes o que desean tratar la apariencia de las venas varicosas. Entre estos tratamientos se incluyen:   · Tratamiento láser de venas varicosas muy pequeñas.  · Medicamentos que se inyectan en la vena. Este medicamento endurece las paredes de la vena y la cierra. Se denomina escleroterapia. Luego, deberá utilizar ropa o vendajes que apliquen presión.  · Cirugía.  INSTRUCCIONES PARA EL CUIDADO DOMICILIARIO  · No permanezca sentado o de pie en una posición durante largos períodos. No se siente con las piernas cruzadas. Descanse con las piernas levantadas durante el día.  · Use medias elásticas o de soporte. No use prendas apretadas alrededor de las piernas, pelvis o cintura.  · Camine todo lo posible para aumentar  el flujo de sangre.  · Levante los pies de la cama por la noche, alrededor de 5 cm.  · Si se ha cortado la piel que está sobre la vena y sangra, recuéstese con la pierna elevada y presione con un paño limpio hasta que la sangre se detenga. Luego coloque una venda sobre el corte. Consulte a su médico si continúa sangrando o necesita una sutura.  SOLICITE ATENCIÓN MÉDICA SI:  · La piel que rodea el tobillo comienza a lesionarse.  · Presenta dolor, enrojecimiento, sensibilidad o hinchazón en la pierna, sobre la vena.  · Siente molestias y dolor en la pierna.  Document Released: 12/17/2004 Document Revised: 06/01/2011  [employer]® Patient Information ©2014 [employer].

## 2012-11-07 NOTE — Progress Notes (Signed)
Natalie Martinez [birth year] [id number]   History:    47 y.o.  for annual gyn exam with only complaint are painful varicose veins for the past 3 months. 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  The patient had a followup visit on January 2014 and she underwent a detail colposcopic evaluation today. The external genitalia, perineum, perirectal region with no gross lesions noted even after application of acetic acid. Vaginal speculum was introduced and a systematic inspection of the entire vagina and vaginal cuff did not demonstrate any lesions either before or after application of the acetic acid.  Patient has history of hyperlipidemia and is currently on Zocor and will need to return to the office in the fasting state for her blood work says she has not follow up with her primary.   Past medical history,surgical history, family history and social history were all reviewed and documented in the EPIC chart.  Gynecologic History No LMP recorded. Patient has had a hysterectomy. Contraception: status post hysterectomy Last Pap: see above. Results were: see above Last mammogram: 2014. Results were: 3D dense breast  Obstetric History OB History  Gravida Para Term Preterm AB SAB TAB Ectopic Multiple Living  3 3 3       3     # Outcome Date GA Lbr Len/2nd Weight Sex Delivery Anes PTL Lv  3 TRM     F  SVD  N Y  2 TRM     F SVD  N N  1 TRM     M SVD  N Y       ROS: A ROS was performed and pertinent positives and negatives are included in the history.  GENERAL: No fevers or chills. HEENT: No change in vision, no earache, sore throat or sinus congestion. NECK: No pain or stiffness. CARDIOVASCULAR: No chest pain or pressure. No palpitations. PULMONARY: No shortness of breath, cough or wheeze. GASTROINTESTINAL: No abdominal pain, nausea, vomiting or diarrhea, melena or bright red blood per rectum. GENITOURINARY: No urinary frequency, urgency, hesitancy or dysuria. MUSCULOSKELETAL: No joint or muscle pain, no back pain, no recent trauma. DERMATOLOGIC: No rash, no itching, no lesions. ENDOCRINE: No polyuria, polydipsia, no heat or cold intolerance. No recent change in weight. HEMATOLOGICAL: No anemia or easy bruising or bleeding. NEUROLOGIC: No headache, seizures, numbness, tingling or weakness. PSYCHIATRIC: No depression, no loss of interest in normal activity or change in sleep pattern.     Exam: chaperone present  BP 120/72  Ht 5' 0.5" (1.537 m)  Wt 145 lb (65.772 kg)  BMI 27.84 kg/m2  Body mass index is 27.84 kg/(m^2).  General appearance : Well developed well nourished female. No acute distress HEENT: Neck supple, trachea midline, no carotid bruits, no thyroidmegaly Lungs: Clear to auscultation, no rhonchi or wheezes, or rib retractions  Heart: Regular rate and rhythm, no murmurs or gallops Breast:Examined in sitting and supine position were symmetrical in appearance, no palpable masses or tenderness,  no skin retraction, no nipple inversion, no nipple discharge, no skin  discoloration, no axillary or supraclavicular lymphadenopathy Abdomen: no palpable masses or tenderness, no rebound or guarding Extremities: no edema or skin discoloration or tenderness  Pelvic:  Bartholin, Urethra, Skene Glands: Within normal limits             Vagina: No gross lesions or discharge  Cervix:  absent  Uterus Absent Adnexa  Without masses or tenderness  Anus and perineum  normal   Rectovaginal  normal sphincter tone without palpated masses or tenderness             Hemoccult not indicated     Assessment/Plan:  47 y.o. female for annual exam patient with past history of dysplasia see note above for detail. Pap smear was done today. If HPV bars is detected we will request and HPV subtype for closer surveillance. Patient will be referred to the vein specialists for possible treatment of her varicose veins. We discussed the usage of long stockings for support when she is on her feet at work. And at rest to elevate her feet. She will return back to the office in a fasting state to have the following labs drawn: Fasting lipid profile, TSH, comprehensive metabolic panel, CBC, and urinalysis. Patient's Tdap vaccine is up-to-date. She was reminded to do her monthly self breast exams.    Ok Edwards MD, 5:44 PM 11/07/2012

## 2012-11-16 ENCOUNTER — Other Ambulatory Visit: Payer: Self-pay | Admitting: Anesthesiology

## 2012-11-16 DIAGNOSIS — B379 Candidiasis, unspecified: Secondary | ICD-10-CM

## 2012-11-16 MED ORDER — FLUCONAZOLE 150 MG PO TABS: 150.0000 mg | ORAL_TABLET | Freq: Once | ORAL | Status: DC

## 2012-11-18 ENCOUNTER — Other Ambulatory Visit: Payer: Self-pay | Admitting: Gynecology

## 2012-11-25 ENCOUNTER — Other Ambulatory Visit: Payer: Self-pay | Admitting: Anesthesiology

## 2012-11-25 MED ORDER — FLUCONAZOLE 150 MG PO TABS: 150.0000 mg | ORAL_TABLET | Freq: Once | ORAL | Status: DC

## 2012-11-28 ENCOUNTER — Other Ambulatory Visit: Payer: Self-pay | Admitting: Gynecology

## 2012-11-28 NOTE — Telephone Encounter (Signed)
At 11/07/12 CE visit you wrote "Patient has history of hyperlipidemia and is currently on Zocor and will need to return to the office in the fasting state for her blood work says she has not follow up with her primary."  Patient has not yet returned for lab work ordered.

## 2012-11-28 NOTE — Telephone Encounter (Signed)
She will only receive 30 tablets with no refills. She needs to see her primary physician. She needs a followup fasting lipid profile and liver function tests  

## 2012-11-28 NOTE — Telephone Encounter (Signed)
She will only receive 30 tablets with no refills. She needs to see her primary physician. She needs a followup fasting lipid profile and liver function tests

## 2012-11-28 NOTE — Telephone Encounter (Signed)
Natalie Martinez will contact patient and let her know to contact PCP for appt for following this.

## 2012-11-29 ENCOUNTER — Other Ambulatory Visit: Payer: BC Managed Care – PPO

## 2012-11-29 DIAGNOSIS — E785 Hyperlipidemia, unspecified: Secondary | ICD-10-CM

## 2012-11-29 DIAGNOSIS — Z01419 Encounter for gynecological examination (general) (routine) without abnormal findings: Secondary | ICD-10-CM

## 2012-11-29 LAB — COMPREHENSIVE METABOLIC PANEL
ALT: 14 U/L (ref 0–35)
AST: 16 U/L (ref 0–37)
Albumin: 4.6 g/dL (ref 3.5–5.2)
Alkaline Phosphatase: 68 U/L (ref 39–117)
Calcium: 9.8 mg/dL (ref 8.4–10.5)
Chloride: 103 mEq/L (ref 96–112)
Potassium: 4.5 mEq/L (ref 3.5–5.3)
Sodium: 139 mEq/L (ref 135–145)

## 2012-11-29 LAB — CBC WITH DIFFERENTIAL/PLATELET
Basophils Absolute: 0 10*3/uL (ref 0.0–0.1)
HCT: 42.4 % (ref 36.0–46.0)
Lymphocytes Relative: 34 % (ref 12–46)
Neutro Abs: 3.5 10*3/uL (ref 1.7–7.7)
Neutrophils Relative %: 60 % (ref 43–77)
Platelets: 244 10*3/uL (ref 150–400)
RDW: 13.4 % (ref 11.5–15.5)
WBC: 5.9 10*3/uL (ref 4.0–10.5)

## 2012-11-29 LAB — LIPID PANEL: LDL Cholesterol: 110 mg/dL — ABNORMAL HIGH (ref 0–99)

## 2012-11-29 LAB — TSH: TSH: 2.299 u[IU]/mL (ref 0.350–4.500)

## 2012-11-30 LAB — URINALYSIS W MICROSCOPIC + REFLEX CULTURE
Bacteria, UA: NONE SEEN
Casts: NONE SEEN
Crystals: NONE SEEN
Hgb urine dipstick: NEGATIVE
Leukocytes, UA: NEGATIVE
Nitrite: NEGATIVE
Specific Gravity, Urine: 1.01 (ref 1.005–1.030)
Urobilinogen, UA: 0.2 mg/dL (ref 0.0–1.0)
pH: 6.5 (ref 5.0–8.0)

## 2013-04-15 ENCOUNTER — Emergency Department (HOSPITAL_COMMUNITY)
Admission: EM | Admit: 2013-04-15 | Discharge: 2013-04-15 | Disposition: A | Discharge status: Home / Self Care | Payer: BC Managed Care – PPO | Source: Home / Self Care | Attending: Emergency Medicine | Admitting: Emergency Medicine

## 2013-04-15 ENCOUNTER — Other Ambulatory Visit: Payer: Self-pay | Admitting: Gynecology

## 2013-04-15 ENCOUNTER — Encounter (HOSPITAL_COMMUNITY): Payer: Self-pay | Admitting: Emergency Medicine

## 2013-04-15 ENCOUNTER — Emergency Department (INDEPENDENT_AMBULATORY_CARE_PROVIDER_SITE_OTHER): Payer: BC Managed Care – PPO

## 2013-04-15 DIAGNOSIS — J209 Acute bronchitis, unspecified: Secondary | ICD-10-CM

## 2013-04-15 DIAGNOSIS — H6692 Otitis media, unspecified, left ear: Secondary | ICD-10-CM

## 2013-04-15 DIAGNOSIS — H6690 Otitis media, unspecified, unspecified ear: Secondary | ICD-10-CM

## 2013-04-15 DIAGNOSIS — J069 Acute upper respiratory infection, unspecified: Secondary | ICD-10-CM

## 2013-04-15 LAB — POCT RAPID STREP A: Streptococcus, Group A Screen (Direct): NEGATIVE

## 2013-04-15 MED ORDER — HYDROCOD POLST-CHLORPHEN POLST 10-8 MG/5ML PO LQCR
5.0000 mL | Freq: Two times a day (BID) | ORAL | Status: DC | PRN
Start: 2013-04-15 — End: 2013-07-10

## 2013-04-15 MED ORDER — ACETAMINOPHEN 325 MG PO TABS
650.0000 mg | ORAL_TABLET | Freq: Once | ORAL | Status: AC
  Administered 2013-04-15: 650 mg via ORAL

## 2013-04-15 MED ORDER — ALBUTEROL SULFATE HFA 108 (90 BASE) MCG/ACT IN AERS: 1.0000 | INHALATION_SPRAY | Freq: Four times a day (QID) | RESPIRATORY_TRACT | Status: DC | PRN

## 2013-04-15 MED ORDER — AMOXICILLIN 500 MG PO CAPS: 1000.0000 mg | ORAL_CAPSULE | Freq: Three times a day (TID) | ORAL | Status: DC

## 2013-04-15 MED ORDER — ACETAMINOPHEN 325 MG PO TABS
ORAL_TABLET | ORAL | Status: AC
  Filled 2013-04-15: qty 2

## 2013-04-15 NOTE — ED Provider Notes (Signed)
Chief Complaint   Chief Complaint  Patient presents with  . URI    History of Present Illness   Natalie Martinez is a 48 year old female who was here with her 2 daughters who have similar symptoms. She describes a four-day history of headache, sore throat, cough, subjective fever, chills, sweats, nasal congestion, rhinorrhea, and nausea. She denies any wheezing, chest pain, sputum production, vomiting, or diarrhea.  Review of Systems   Other than as noted above, the patient denies any of the following symptoms: Systemic:  No fevers, chills, sweats, or myalgias. Eye:  No redness or discharge. ENT:  No ear pain, headache, nasal congestion, drainage, sinus pressure, or sore throat. Neck:  No neck pain, stiffness, or swollen glands. Lungs:  No cough, sputum production, hemoptysis, wheezing, chest tightness, shortness of breath or chest pain. GI:  No abdominal pain, nausea, vomiting or diarrhea.  [place]   Past medical history, family history, social history, meds, and allergies were reviewed.   Physical exam   Vital signs:  BP 135/82  Pulse 69  Temp(Src) 98.1 F (36.7 C) (Oral)  Resp 17  SpO2 98% General:  Alert and oriented.  In no distress.  Skin warm and dry. Eye:  No conjunctival injection or drainage. Lids were normal. ENT:  Left TM was erythematous.  Nasal mucosa was clear and uncongested, without drainage.  Mucous membranes were moist.  Pharynx was clear with no exudate or drainage.  There were no oral ulcerations or lesions. Neck:  Supple, no adenopathy, tenderness or mass. Lungs:  No respiratory distress.  She has scattered bilateral expiratory wheezes, no rales or rhonchi.  Heart:  Regular rhythm, without gallops, murmers or rubs. Skin:  Clear, warm, and dry, without rash or lesions.  Labs   Results for orders placed during the hospital encounter of 04/15/13  POCT RAPID STREP A ([facility] CARE ONLY)      Result Value Range   Streptococcus, Group A Screen (Direct)  NEGATIVE  NEGATIVE     Radiology   Dg Chest 2 View  04/15/2013   CLINICAL DATA:  Cough and fever for 4 days  EXAM: CHEST  2 VIEW  COMPARISON:  None.  FINDINGS: The heart size and mediastinal contours are within normal limits. Both lungs are clear. The visualized skeletal structures are unremarkable.  IMPRESSION: No active cardiopulmonary disease.   Electronically Signed   By: Kerby Moors M.D.   On: 04/15/2013 19:02   Assessment     The primary encounter diagnosis was Viral upper respiratory infection. Diagnoses of Left otitis media and Acute bronchitis were also pertinent to this visit.  Plan    1.  Meds:  The following meds were prescribed:   Discharge Medication List as of 04/15/2013  7:19 PM    START taking these medications   Details  albuterol (PROVENTIL HFA;VENTOLIN HFA) 108 (90 BASE) MCG/ACT inhaler Inhale 1-2 puffs into the lungs every 6 (six) hours as needed for wheezing or shortness of breath., Starting 04/15/2013, Until Discontinued, Normal    amoxicillin (AMOXIL) 500 MG capsule Take 2 capsules (1,000 mg total) by mouth 3 (three) times daily., Starting 04/15/2013, Until Discontinued, Normal    chlorpheniramine-HYDROcodone (TUSSIONEX) 10-8 MG/5ML LQCR Take 5 mLs by mouth every 12 (twelve) hours as needed for cough., Starting 04/15/2013, Until Discontinued, Normal        2.  Patient Education/Counseling:  The patient was given appropriate handouts, self care instructions, and instructed in symptomatic relief.  Instructed to get extra fluids,  rest, and use a cool mist vaporizer.    3.  Follow up:  The patient was told to follow up here if no better in 3 to 4 days, or sooner if becoming worse in any way, and given some red flag symptoms such as increasing fever, difficulty breathing, chest pain, or persistent vomiting which would prompt immediate return.  Follow up here as needed.      Harden Mo, MD 04/15/13 [phone]

## 2013-04-15 NOTE — ED Notes (Signed)
Pt c/o cold sxs onset 3 days Sxs include: HA,cough, chest d/c due to cough, nauseas, fevers Denies: v/d Taking tyle OTC w/no relief She is alert w/no signs of acute distress.

## 2013-04-15 NOTE — Discharge Instructions (Signed)
Bronquitis (Bronchitis) La bronquitis es una inflamacin de las vas respiratorias que se extienden desde la trquea [facility] pulmones (bronquios). A menudo, la inflamacin produce la formacin de mucosidad, lo que genera tos. Si la inflamacin es grave, puede provocar falta de aire. CAUSAS  Las causas de la bronquitis pueden ser:   Infecciones virales.  Bacterias.  Humo del cigarrillo.  Alrgenos, contaminantes y otros irritantes. [place] sntoma ms habitual de la bronquitis es la tos frecuente con mucosidad. Otros sntomas son:  Cristy Hilts.  Dolores [employer] cuerpo.  Congestin en el pecho.  Escalofros.  Falta de aire.  Dolor de Investment banker, operational. DIAGNSTICO  La bronquitis en general se diagnostica con la historia clnica y un examen fsico. En algunos casos se indican otros estudios, como radiografas, para Clinical research associate.  TRATAMIENTO  Tal vez deba evitar el contacto con la causa del problema (por ejemplo, el cigarrillo). En algunos casos es Surveyor, quantity. Estos pueden ser:  Antibiticos. Tal vez se los receten si la causa de la bronquitis es una bacteria.  Antitusivos. Tal vez se los receten para [employer] sntomas de la tos.  Medicamentos inhalados. Tal vez se los receten para Financial controller las vas respiratorias y Museum/gallery exhibitions officer respiracin.  Medicamentos con corticoides. Tal vez se los receten si tiene bronquitis recurrente (crnica). INSTRUCCIONES PARA EL CUIDADO EN EL HOGAR  Descanse lo suficiente.  Beba lquidos en abundancia para mantener la orina de color claro o amarillo plido (excepto que padezca una enfermedad que requiera la restriccin de lquidos). Tome mucho lquido para Radiation protection practitioner las secreciones y Tree surgeon.  Tome solo medicamentos de venta libre o recetados, segn las [place] los antibiticos exclusivamente segn las indicaciones. Finalice la prescripcin completa, aunque se  sienta mejor.  Evite el humo de segunda mano, los qumicos irritantes y los vapores fuertes. Estos agentes empeoran la bronquitis. Si es fumador, abandone el hbito. Considere el uso de goma de Higher education careers adviser o la aplicacin de parches en la piel que contengan nicotina para aliviar los sntomas de abstinencia. Si deja de fumar, sus pulmones se curarn ms rpido.  Ponga un humidificador de vapor fro en la habitacin por la noche para humedecer el aire. Puede ayudarlo a aflojar la mucosidad. Cambie el agua del humidificador a diario. Tambin puede abrir el agua caliente de la ducha y sentarse en el bao con la puerta cerrada durante 5a5minutos.  Concurra a las consultas de control con su mdico segn las indicaciones.  Lvese las manos con frecuencia para evitar contagiarse bronquitis nuevamente y para no extender la infeccin a Producer, television/film/video. SOLICITE ATENCIN MDICA SI: Los sntomas no mejoran despus de 1 semana de tratamiento.  SOLICITE ATENCIN MDICA DE INMEDIATO SI:  La fiebre aumenta.  Tiene escalofros.  Siente dolor en el pecho.  Le empeora la falta el aire.  La flema tiene [place].  Se desmaya.  Tiene vahdos.  Sufre un dolor intenso de [place].  Vomita repetidas veces. ASEGRESE DE QUE:   Comprende estas instrucciones.  Controlar su afeccin.  Recibir ayuda de inmediato si no mejora o si empeora. Document Released: 03/09/2005 Document Revised: 12/28/2012 [facility] Patient Information 2014 [place], [place]. Cmo usar Educational psychologist (How to Use an Inhaler) Es muy importante usar una tcnica adecuada para Forensic psychologist. Una buena tcnica garantiza que el medicamento llegue a los pulmones. Una tcnica inadecuada da como resultado que el medicamento se deposite en la lengua y en la parte posterior de la garganta  en lugar de llegar a las vas respiratorias. Si no aplica una buena tcnica a la hora de [employer], el medicamento no lo ayudar. PASOS QUE DEBE SEGUIR SI [place]  UN INHALADOR SIN TUBO DE EXTENSIN 1. Retire la tapa del inhalador. 2. Si [place] el inhalador por primera vez, ser necesario que lo prepare. Sacuda el inhalador durante 5 segundos y libere cuatro descargas en el aire, lejos del [place]. Consulte a su mdico o farmacutico si tiene preguntas acerca de la preparacin del inhalador. 3. Sacuda el inhalador durante 5segundos antes de cada inspiracin (inhalacin). 4. Coloque el inhalador de [employer] parte superior del envase quede [place]. 5. Coloque el dedo ndice en la parte superior del envase del medicamento. El pulgar sostiene la parte inferior del [place]. 6. [place]. 7. Coloque el inhalador [employer] dientes y apriete los labios fuertemente alrededor de la boquilla, o sostenga el inhalador a unas distancia de 1 a 2pulgadas (2,5 a 5cm) de la boca abierta. Si no est seguro de Copywriter, advertising, consulte a su mdico. 8. Espire (exhale) normalmente y lo ms profundamente posible. 9. Presione el envase hacia abajo con el dedo ndice para Product/process development scientist. 10. Al mismo tiempo que presiona el envase, inhale profunda y lentamente hasta que los pulmones se llenen completamente. Esto debe llevarle de 4 a 6segundos. Mantenga la boca hacia abajo. [place] medicamento en los pulmones de 5 a 10segundos (10segundos es lo ms indicado). Esto ayuda a que el medicamento ingrese en las vas respiratorias pequeas y en los pulmones. 12. Exhale lentamente a travs de los labios fruncidos. Ponga los labios como cuando silba. 13. Entre cada descarga, espere de 15 a 30, como mnimo. Contine con los pasos indicados anteriormente hasta que haya tomado el nmero de descargas que el mdico le indic. No use el inhalador ms veces de las que el [employer]. 14. Vuelva a colocar la tapa del inhalador. [place] de su mdico o las instrucciones que vienen para Air cabin crew. PASOS QUE DEBE SEGUIR SI [place] UN INHALADOR  CON EXTENSIN (ESPACIADOR) 1. Retire la tapa del inhalador. 2. Si [place] el inhalador por primera vez, ser necesario que lo prepare. Sacuda el inhalador durante 5 segundos y libere cuatro descargas en el aire, lejos del [place]. Consulte a su mdico o farmacutico si tiene preguntas acerca de la preparacin del inhalador. 3. Sacuda el inhalador durante 5segundos antes de cada inspiracin (inhalacin). 4. Coloque el extremo abierto del Geographical information systems officer en la boquilla del inhalador. 5. Coloque el inhalador de modo que la parte superior del envase quede hacia arriba y la boquilla del espaciador delante suyo. 6. Coloque el dedo ndice en la parte superior del envase del medicamento. El pulgar sostiene la parte inferior del inhalador y Arts development officer. 7. Espire (exhale) normalmente y lo ms profundamente posible. 8. Inmediatamente despus de exhalar, coloque el espaciador entre sus dientes y [place] de su boca. Apriete los labios alrededor Aeronautical engineer. 9. Presione el envase hacia abajo con el dedo ndice para Product/process development scientist. 10. Al mismo tiempo que presiona el envase, inhale profunda y lentamente hasta que los pulmones se llenen completamente. Esto debe llevarle de 4 a 6segundos. Mantenga la lengua hacia abajo y [place] del [place]. [place] medicamento en los pulmones de 5 a 10segundos (10segundos es lo ms indicado). Esto ayuda a que el medicamento ingrese en las vas respiratorias pequeas y en los pulmones. Exhale. 12. Repita  la inhalacin profundamente a travs de la boquilla del Geographical information systems officer. Contenga la respiracin por lo menos 10 segundos (10 segundos es lo ms indicado). Exhale lentamente. Si le resulta difcil tomar esta segunda inhalacin profunda a travs del espaciador, respire normalmente varias veces a travs del mismo. Retire el espaciador de su boca. 13. Entre cada descarga, espere de 15 a 30, como mnimo. Contine con los pasos indicados anteriormente hasta que haya tomado el nmero  de descargas que el mdico le indic. No use el inhalador ms veces de las que el [employer]. 65. Retire el espaciador del inhalador y colquele la tapa al Tax inspector. [place] de su mdico o las instrucciones que vienen para Air cabin crew y Arts development officer. Si [place] diferentes tipos de inhaladores, use el medicamento de alivio rpido para abrir las vas respiratorias 10 a 31minutos antes de usar un corticoide, si el mdico se lo indic. Si no est seguro de [employer] usar y le indicaron usarlo, consulte a su mdico, enfermera o terapeuta en vas respiratorias. Si [place] un inhalador con corticoides, enjuguese siempre la boca con agua despus de la ltima descarga, hgase grgaras y escupa el agua. No trague el agua. EVITE LO SIGUIENTE:  Inhalar antes o despus de comenzar el aerosol del medicamento. Es necesario tener prctica para coordinar la respiracin con la emisin del aerosol.  Al emitir el aerosol, inhale por la nariz (ms que por la boca). CMO SABER SI EL INHALADOR EST LLENO O VACO No puede saber cundo un inhalador est vaco al sacudirlo. Algunos inhaladores ahora vienen con contador de dosis. Pdale a su mdico que le recete el que tiene el contador de dosis si siente que necesita ayuda extra. Si el inhalador no tiene Scientist, physiological, pdale a su mdico que lo ayude a Soil scientist a llenarlo. Anote la fecha en que deber rellenarlo en un almanaque o en el envase del inhalador. Rellene el inhalador de 7 a 10das antes de que se termine. Asegrese de Theatre manager un adecuado suministro del medicamento. Esto incluye asegurarse de que no est vencido y de que tiene un inhalador de repuesto.  SOLICITE ATENCIN MDICA SI:   Los sntomas solo se Engineer, mining con Forensic psychologist.  Tiene dificultad para Water quality scientist.  Experimenta un leve aumento de la flema. SOLICITE ATENCIN MDICA DE INMEDIATO SI:   Siente poco alivio con el  uso de los [place], o ni tiene [place]. Contina con sibilancias y siente que le falta el aire, tiene opresin en el [place], o ambos.  Siente mareos, dolor de cabeza o una frecuencia cardaca acelerada.  Siente escalofros, fiebre o sudores nocturnos.  Nota un aumento en la produccin de flema o hay sangre en la flema. ASEGRESE DE QUE:   Comprende estas instrucciones.  Controlar su afeccin.  Recibir ayuda de inmediato si no mejora o si empeora. Document Released: 03/09/2005 Document Revised: 12/28/2012 [facility] Patient Information 2014 [place], [place].

## 2013-04-17 LAB — CULTURE, GROUP A STREP

## 2013-04-17 NOTE — Telephone Encounter (Signed)
Needs to get her fasting lab work

## 2013-05-30 NOTE — ED Provider Notes (Signed)
[facility]  NAME:  [name] [name], [place]  SEX:   F  ADMIT: 05/30/2013  DOB:   March 01, 1966  MR#    [id number]  ROOM:    TIME DICTATED: 04 36 PM  ACCT#  [id number]    cc: Milford CageANGELINA FRIAS MD, Faythe GheeNaveen Akkina MD    SUPERVISING ATTENDING PHYSICIAN:   Micki RileySara Tsuchitani, M.D.     DATE OF SERVICE:  05/30/2013     TIME OF EVALUATION:   1605    PRIMARY CARE PHYSICIAN:   Milford CageAngelina Frias, MD    CHIEF COMPLAINT:     Chest pain and shortness of breath.    HISTORY OF PRESENT ILLNESS:  The patient is a 48 year old female who presents to the Emergency Department  with approximately 2 weeks of worsening shortness of breath.  She was   diagnosed  with asthma 1-1/2 years ago and states like she does feel that this related to  her asthma.  Her chest discomfort began this morning around 9 a.m. after   taking some Albuterol and inhaler treatments, and she developed some mild   left-  sided chest discomfort with palpitations that was not worse with exertion, did     not improve with rest and was only about a 2 out of 10 in quality of pain, or   level of pain.    Her pain was intermittent and described as aching, though the patient did   state  that her pain has been constantly on since this morning.  She denies any  swelling in her arms or legs, no difficulty breathing, no pain radiating into  her arm or her jaw and palpitations have resolved.  The patient states that   she  has difficulty getting a complete breath in.    REVIEW OF SYSTEMS:    CONSTITUTIONAL:  No fever, chills, or weight loss.   EYES:   No visual symptoms.   ENT:  No sore throat, runny nose, or other URI symptoms.   ENDOCRINE:  No diabetic symptoms.   HEMATOLOGIC AND LYMPHATIC:   No excessive bruising or lymph node swelling.   ALLERGIC AND IMMUNOLOGIC:  No urticaria or allergy symptoms.   RESPIRATORY:  Shortness of breath, wheezing, no cough.   CARDIOVASCULAR:  Chest pain, palpitations, no swelling in the legs.   GASTROINTESTINAL:  No vomiting, diarrhea, or abdominal pain.   GENITOURINARY:  No dysuria, frequency, or urgency.   MUSCULOSKELETAL:  No joint pain or swelling.    PAST MEDICAL HISTORY:    Significant for asthma and irregular menses.      PAST SURGICAL HISTORY:    Cholecystectomy in 1998, 3 caesarean sections and a tubal ligation in 1990.    SOCIAL HISTORY:    The patient does not smoke, drink, no illicit drug use.    FAMILY HISTORY:    No significant past family history.    PHYSICAL EXAMINATION:  VITAL SIGNS:  Temperature is 98.4, pulse 86, blood pressure 135/88,  respirations 20, saturating 98% on room air.  GENERAL APPEARANCE:  Patient appears well developed and well nourished.   Appearance and behavior are age and situation appropriate.  In no acute  distress.    EYES:  Conjunctivae clear, lids normal.  Pupils equal, symmetrical, and  normally reactive.   EARS AND NOSE:  Hearing is grossly intact to voice.  Internal and external  examinations of the ears and nose are unremarkable.  Nasal mucosa, septum, and  turbinates unremarkable.  LYMPHATIC:  No cervical or submandibular lymphadenopathy palpated.    CARDIOVASCULAR:   Heart regular, without murmurs, gallops, rubs, or thrills.   DP pulses 2+ and equal bilaterally.  No peripheral edema or significant  varicosities.   RESPIRATORY:  Clear and equal breath sounds.  No respiratory distress,  tachypnea, or accessory muscle use.   GASTROINTESTINAL:  Abdomen soft, nontender, without complaint of pain to  palpation.  No hepatomegaly or splenomegaly.   Stance and gait appear normal.    NAILS:  No clubbing or deformities.  Nailbeds pink with prompt capillary  refill.   SKIN:  Warm and dry without rashes.   NEUROLOGIC:  Alert, oriented.  Sensation intact, motor strength equal and  symmetric.   PSYCHIATRIC:  Judgement appears appropriate.     INITIAL ASSESSMENT AND PLAN:  The patient is a 48 year old female who is presenting with atypical chest pain   and shortness of breath likely both related to her asthma for which she was  recently diagnosed and verified with pulmonary function tests.  The patient  does not appear to be in any acute exacerbation of asthma at this time with  good air movement and clear to auscultation throughout on her examination and  saturating 98% on room air.    Given the patient's discomfort and saying that she cannot take a full breath,  she was given 125 mg of Solu-Medrol IV as well as given a DuoNeb.  We will  obtain a single i-STAT troponin and i-STAT Chem 8 and obtain a chest x-ray,   and  if the patient improves, we will likely defer to those labs as negative and  rule out the patient as a minimal cardiac risk.    If the patient's symptoms do not improve despite treatment, we will consider a  cardiac observation status on this patient.  She has no significant past   family  history of cardiac disease, no personal history of cardiac disease, but she is  obese and given the fact that she is clear to auscultation it is something to  consider.  We will reevaluate.    CONTINUATION BY Donn Pierini, MD:    DIAGNOSTIC  RESULTS:  I-Stat troponin and I-Stat chem7 were nondiagnostic and chest x-ray revealed   no  acute cardiopulmonary disease.      REEVALUATION AND EMERGENCY DEPARTMENT COURSE:     The patient was given a DuoNeb and her IV Solu-Medrol and expressed some  improvement in her symptoms.  Patient is currently chest pain free at this  time.  Discussed and offered observation status for stress test in the a.m.   and  patient declined stating that she would rather go home.  Discussed the risks  and benefits of this choice, and the inability to completely rule out ACS  despite the fact that she is low risk, and patient demonstrated full  understanding of this discussion and elected to be discharged home.  Given the  patient's history, symptoms are likely secondary to her asthma as well as   palpitations secondary to her nebulized medication this morning.    FINAL DIAGNOSIS:     Asthma exacerbation and chest pain otherwise unspecified.    DISPOSITION:      The patient was discharged home to follow up with her PCM at the soonest  possible appointment.   She was discharged with a prescription for prednisone  50 mg daily for 4 days.  The patient was in stable condition on discharge, and  improved.    The patient was personally evaluated by myself and Dr. Micki Riley, who  agrees with the above assessment and plan.       CONTINUATION  BY Ludie Hudon TSUCHITANI, MD:    I interviewed and examined the patient.  I discussed with the resident and  agree with their evaluation and plan as documented here.     The patient is entirely comfortable.  She was given nebulizer, Solu-Medrol,   and  she said her symptoms resolved.  She is following with Dr. Grayland Jack for asthma  and feels like it has been acting up in the past 2 weeks, but became concerned  today because her symptoms did not go away after using her inhaler, so she   came  in here.  She denied any discomfort on my evaluation.  She said she was   already  feeling better.   We did send a troponin, which was negative.   Her symptoms  have been persistent for hours.  She did not have any wheezing here, but she  said earlier she felt like she was.  I discussed with her keeping her for a  stress test to rule out a cardiac equivalent.  The patient has thought about  it.  She said she did not want to stay.  She understands that her cardiac  evaluation is not complete, but she does feel like this was her asthma.  She   is feeling better, and she has declined the recommendation to stay.   She   understands that if she gets new or worse symptoms, she should come back to   the ER immediately. Her symptoms did sound atypical,  however, given that she   did not have wheezing here I explained to her that we cannot be  sure it was    her asthma. But, again, the patient has thought about the recommendation to   stay for observation, and has declined.  She is symptom free at this time.    She is going to be discharged in stable condition with instructions to follow   up with Dr. Grayland Jack.      ___________________  Judeen Hammans Tsuchitani MD  Dictated By: Trudee Kuster, MD    My signature above authenticates this document and my orders, the final  diagnosis (es), discharge prescription (s), and instructions in the [facility]  Pul[facility]heck record.  Nursing notes have been reviewed by the physician/mid-level provider.    If you have any questions please contact [phone].    [facility]  D:05/30/2013 16:36:19  T: 05/30/2013 22:44:44  [id number]  Authenticated and Linus Orn by Tana Conch, MD On 06/03/13 5:30:22 PM

## 2013-07-10 ENCOUNTER — Encounter: Payer: Self-pay | Admitting: Gynecology

## 2013-07-10 ENCOUNTER — Other Ambulatory Visit: Payer: Self-pay

## 2013-07-10 ENCOUNTER — Ambulatory Visit (INDEPENDENT_AMBULATORY_CARE_PROVIDER_SITE_OTHER): Payer: BC Managed Care – PPO | Admitting: Gynecology

## 2013-07-10 VITALS — BP 126/78

## 2013-07-10 DIAGNOSIS — Z113 Encounter for screening for infections with a predominantly sexual mode of transmission: Secondary | ICD-10-CM

## 2013-07-10 DIAGNOSIS — N898 Other specified noninflammatory disorders of vagina: Secondary | ICD-10-CM

## 2013-07-10 DIAGNOSIS — L293 Anogenital pruritus, unspecified: Secondary | ICD-10-CM

## 2013-07-10 DIAGNOSIS — N951 Menopausal and female climacteric states: Secondary | ICD-10-CM

## 2013-07-10 DIAGNOSIS — Z1231 Encounter for screening mammogram for malignant neoplasm of breast: Secondary | ICD-10-CM

## 2013-07-10 LAB — WET PREP FOR TRICH, YEAST, CLUE
CLUE CELLS WET PREP: NONE SEEN
TRICH WET PREP: NONE SEEN
WBC, Wet Prep HPF POC: NONE SEEN
YEAST WET PREP: NONE SEEN

## 2013-07-10 NOTE — Progress Notes (Signed)
   The patient presented to the office today complaining of some vaginal pruritus and started about 3 days ago when she began becoming sexually active once again. She is separated for coronary and returned back and that's when she felt a vaginal irritation. Patient with prior hysterectomy. Patient wanted to have an STD screen. Patient's complaints and vasomotor symptoms on and off.  Exam: Pelvic: Bartholin urethra Skene was within normal limits Vagina: Very dry no discharge noted vaginal cuff intact Bimanual exam: Not done Rectal exam: Not done  Urinalysis not done patient not complaining of any dysuria or frequency  Wet prep was essentially negative  GC chlamydia culture was obtained  Assessment/plan: Patient appears to be perimenopausal based on her symptoms and her vaginal dryness noted at time of exam. Wet prep was negative. A GC chlamydia culture was obtained which is pending at time of this dictation. We'll check her [facility] today. We will also check HIV, RPR, hepatitis B and C. I provided patient will additional information on menopause as well as hormone replacement therapy in Spanish. We'll await the results and manage accordingly. Meanwhile it was reminded for the patient to use K-Y jelly when necessary

## 2013-07-10 NOTE — Patient Instructions (Signed)
Terapia de reemplazo hormonal (Hormone Replacement Therapy) En la menopausia, su cuerpo comienza a producir menos estrgeno y progesterona. Esto provoca que el cuerpo deje de tener perodos menstruales. Esto se debe a que el estrgeno y la progesterona controlan sus perodos y su ciclo menstrual. Una falta de estrgeno puede causar sntomas tales como:  Golpes calor.  Sequedad vaginal  Piel seca.  Prdida del deseo sexual.  Riesgo de prdida de hueso (osteoporosis). Cuando esto ocurre, puede elegir realizar una terapia hormonal para volver a obtener el estrgeno perdido durante la menopausia. Cuando slo se introduce esta hormona, el procedimiento se conoce normalmente como TRE (terapia de reemplazo de estrgeno). Cuando la hormona progestina se combina con el estrgeno, el procedimiento se conoce normalmente como TH (terapia hormonal). Esto es lo que previamente se conoca como terapia de reemplazo hormonal (TRH). El profesional que le asiste le ayudar a tomar una decisin acerca de lo que resulte lo mejor para usted. La decisin de realizar una TRH cambia a menudo debido a que se realizan nuevos exmenes. Muchos estudios no ponen de acuerdo con respecto a los beneficios de realizar una terapia de reemplazo hormonal.  BENEFICIOS PROBABLES DE LA TRH QUE INCLUYEN PROTECCIN CONTRA:  Golpes de calor - Un golpe de calor es la sensacin repentina de calor sobre la cara y el cuerpo. La piel enrojece, como al sonrojarse. Estn asociados con la transpiracin y los trastornos del sueo. Las mujeres que atraviesan la menopausia pueden tener golpes de calor unas pocas veces en el mes o varios al da; esto depende de la mujer.  Osteoporosis (prdida de hueso)  El estrgeno ayuda a protegerse contra la prdida de hueso. Luego de la menopausia, los huesos de una mujer pierden calcio y se vuelven frgiles y quebradizos. Como resultado, es ms probable que el hueso se quiebre. Los que resultan afectados con  mayor frecuencia son los de la cadera, la mueca y la columna vertebral. La terapia hormonal puede ayudar a retardar la prdida de hueso luego de la menopausia. Realizar ejercicios con peso y tomar calcio con vitamina D tambin puede ayudar a prevenir la prdida de hueso. Existen medicamentos que puede prescribir el profesional que la asiste para ayudar a prevenir la osteoporosis.  Sequedad vaginal  La prdida de estrgeno produce cambios en la vagina. El recubrimiento de la misma puede volverse fino y reseco. Estos cambios pueden causar dolor y sangrado durante las relaciones sexuales. La sequedad tambin puede producir una infeccin. Puede ocasionarle ardor y picazn.  Las infecciones en las vas urinarias son ms comunes luego de la menopausia debido a la falta de estrgeno.  Otros beneficios posibles del estrgeno incluyen un cambio positivo en el humor y en la memoria de corto plazo en las mujeres. EFECTOS SECUNDARIOS Y RIESGOS  Utilizar estrgeno slo sin progesterona causa que el recubrimiento del tero crezca. Esto aumenta el riesgo de cncer endometrial. El profesional que la asiste deber darle otra hormona llamada progestina, si usted tiene tero.  Las mujeres que realizan una TH combinada (estrgeno y progestina) parecen tener un mayor riesgo de sufrir cncer de mama. El riesgo parece ser pequeo, pero aumenta a lo largo del tiempo que se realice la TH.  La terapia combinada tambin hace que el tejido mamario sea levemente ms denso, lo que hace que sea ms difcil leer mamografas (radiografas de mama).  Combinada, la terapia de estrgeno y progesterona puede realizarse todos los das, en cuyo caso podrn aparecer manchas de sangre. La TH puede realizarse   de manera cclica, en cuyo caso tendr perodos menstruales.  La TH puede aumentar el riesgo de infarto, ataque cardaco, cncer de mama y formacin de cogulos en la pierna. TRATAMIENTO  Si decide realizar una TH y tiene tero,  normalmente se prescribe el uso de estrgeno y progestina.  El profesional que la asiste le ayudar a decidir la mejor forma de tomar los medicamentos.  Lo mejor es tomar la menor dosis posible que pueda ayudarla con sus sntomas y tomarlos durante la menor cantidad de tiempo posible.  La terapia hormonal puede ayudar a aliviar algunos de los problemas (sntomas) que afectan a las mujeres durante la menopausia. Antes de tomar una decisin con respecto a la TH, converse con el profesional que la asiste acerca de qu es lo mejor para usted. Mantngase bien informada y sintase cmoda con sus decisiones. INSTRUCCIONES PARA EL CUIDADO DOMICILIARIO:  Siga las indicaciones del profesional con respecto a cmo tomar los medicamentos.  Hgase controles de manera regular, e incluya Papanicolau y mamografas. SOLICITE ATENCIN MDICA DE INMEDIATO SI PRESENTA:  Hemorragia vaginal anormal.  Dolor o inflamacin en las piernas, falta de aliento o dolor en el pecho.  Mareos o dolores de cabeza.  Protuberancias o cambios en sus mamas o axilas.  Pronunciacin inarticulada.  Debilidad o adormecimiento en los brazos o las piernas.  Dolor, ardor o sangrado al orinar.  Dolor abdominal. Document Released: 08/26/2007 Document Revised: 06/01/2011 [employer] Patient Information 2014 [employer]. Perimenopausia (Perimenopause) La perimenopausia es el momento en que su cuerpo comienza a pasar a la menopausia (sin menstruacin durante 12 meses consecutivos). Es un proceso natural. La perimenopausia puede comenzar entre 2 y 8 aos antes de la menopausia y por lo general tiene una duracin de 1 ao ms pasada la menopausia. Durante este tiempo, los ovarios podran producir un vulo o no. Los ovarios varan su produccin de las hormonas estrgeno y progesterona cada mes. Esto puede causar perodos menstruales irregulares, dificultad para quedar embarazada, hemorragia vaginal entre perodos y sntomas  incmodos. CAUSAS  Produccin irregular de las hormonas ovricas estrgeno y progesterona, y no ovular todos los meses.  Otras causas son:  Tumor de la glndula pituitaria.  Enfermedades que afectan los ovarios.  Radioterapia.  Quimioterapia.  Causas desconocidas.  Fumar mucho y abusar del consumo de alcohol puede llevar a que la perimenopausia aparezca antes. SIGNOS Y SNTOMAS   Acaloramiento.  Sudoracin nocturna.  Perodos menstruales irregulares.  Disminucin del deseo sexual.  Sequedad vaginal.  Dolores de cabeza.  Cambios en el estado de nimo.  Depresin.  Problemas de memoria.  Irritabilidad.  Cansancio.  Aumento de peso.  Problemas para quedar embarazada.  Prdida de clulas seas (osteoporosis).  Comienzo de endurecimiento de las arterias (aterosclerosis). DIAGNSTICO  El mdico realizar un diagnstico en funcin de su edad, historial de perodos menstruales y sntomas. Le realizarn un examen fsico para ver si hay algn cambio en su cuerpo, en especial en sus rganos reproductores. Las pruebas hormonales pueden ser o no tiles segn la cantidad de hormonas femeninas que produzca y cundo las produzca. Sin embargo, podrn realizarse otras pruebas hormonales para detectar otros problemas. TRATAMIENTO  En algunos casos, no se necesita tratamiento. La decisin acerca de qu tratamiento es necesario durante la perimenopausia deber realizarse en conjunto con su mdico segn cmo estn afectando los sntomas a su estilo de vida. Existen varios tratamientos disponibles, como:  Tratar cada sntoma individual con medicamentos especficos para ese sntoma.  Algunos medicamentos herbales pueden ayudar en sntomas   especficos.  Psicoterapia.  Terapia grupal. INSTRUCCIONES PARA EL CUIDADO EN EL HOGAR   Controle sus periodos menstruales (cundo ocurren, qu tan abundantes son, cunto tiempo pasa entre perodos, y cunto duran) como tambin sus sntomas y  cundo comenzaron.  Tome slo medicamentos de venta libre o recetados, segn las indicaciones del mdico.  Duerma y descanse.  Haga actividad fsica.  Consuma una dieta que contenga calcio (bueno para los huesos) y productos derivados de la soja (actan como estrgenos).  No fume.  Evite las bebidas alcohlicas.  Tome los suplementos vitamnicos segn las indicaciones del mdico. En ciertos casos, puede ser de ayuda tomar vitamina E.  Tome suplementos de calcio y vitamina D para ayudar a prevenir la prdida sea.  En algunos casos la terapia de grupo podr ayudarla.  La acupuntura puede ser de ayuda en ciertos casos. SOLICITE ATENCIN MDICA SI:   Tiene preguntas acerca de sus sntomas.  Necesita ser derivada a un especialista (gineclogo, psiquiatra, o psiclogo). SOLICITE ATENCIN MDICA DE INMEDIATO SI:   Sufre una hemorragia vaginal abundante.  Su perodo menstrual dura ms de 8 das.  Sus perodos son recurrentes cada menos de 21 das.  Tiene hemorragias durante las relaciones sexuales.  Est muy deprimido.  Siente dolor al orinar.  Siente dolor de cabeza intenso.  Tiene problemas de visin. Document Released: 03/09/2005 Document Revised: 12/28/2012 [employer] Patient Information 2014 [employer].  

## 2013-07-11 ENCOUNTER — Ambulatory Visit
Admission: RE | Admit: 2013-07-11 | Discharge: 2013-07-11 | Disposition: A | Discharge status: Home / Self Care | Payer: BC Managed Care – PPO | Source: Ambulatory Visit

## 2013-07-11 ENCOUNTER — Encounter (INDEPENDENT_AMBULATORY_CARE_PROVIDER_SITE_OTHER): Payer: Self-pay

## 2013-07-11 DIAGNOSIS — Z1231 Encounter for screening mammogram for malignant neoplasm of breast: Secondary | ICD-10-CM

## 2013-07-11 LAB — GC/CHLAMYDIA PROBE AMP
CT PROBE, AMP APTIMA: NEGATIVE
GC Probe RNA: NEGATIVE

## 2013-07-11 LAB — FOLLICLE STIMULATING HORMONE: FSH: 11.6 m[IU]/mL

## 2013-07-11 LAB — HEPATITIS B SURFACE ANTIGEN: Hepatitis B Surface Ag: NEGATIVE

## 2013-07-11 LAB — HIV ANTIBODY (ROUTINE TESTING W REFLEX): HIV 1&2 Ab, 4th Generation: NONREACTIVE

## 2013-07-11 LAB — RPR

## 2013-07-11 LAB — HEPATITIS C ANTIBODY: HCV AB: NEGATIVE

## 2013-07-18 ENCOUNTER — Other Ambulatory Visit: Payer: Self-pay | Admitting: Physician Assistant

## 2013-08-03 ENCOUNTER — Encounter

## 2013-11-08 ENCOUNTER — Encounter: Payer: Self-pay | Admitting: Gynecology

## 2013-11-08 ENCOUNTER — Ambulatory Visit (INDEPENDENT_AMBULATORY_CARE_PROVIDER_SITE_OTHER): Payer: BC Managed Care – PPO | Admitting: Gynecology

## 2013-11-08 ENCOUNTER — Other Ambulatory Visit (HOSPITAL_COMMUNITY)
Admission: RE | Admit: 2013-11-08 | Discharge: 2013-11-08 | Disposition: A | Discharge status: Home / Self Care | Payer: BC Managed Care – PPO | Source: Ambulatory Visit | Attending: Gynecology | Admitting: Gynecology

## 2013-11-08 VITALS — BP 110/70 | Ht 60.5 in | Wt 150.4 lb

## 2013-11-08 DIAGNOSIS — E785 Hyperlipidemia, unspecified: Secondary | ICD-10-CM

## 2013-11-08 DIAGNOSIS — Z01419 Encounter for gynecological examination (general) (routine) without abnormal findings: Secondary | ICD-10-CM | POA: Diagnosis not present

## 2013-11-08 DIAGNOSIS — Z87411 Personal history of vaginal dysplasia: Secondary | ICD-10-CM

## 2013-11-08 DIAGNOSIS — Z87410 Personal history of cervical dysplasia: Secondary | ICD-10-CM

## 2013-11-08 DIAGNOSIS — I83893 Varicose veins of bilateral lower extremities with other complications: Secondary | ICD-10-CM

## 2013-11-08 NOTE — Progress Notes (Signed)
Natalie Martinez 03/21/1966 [id number]   History:    48 y.o.  for annual gyn exam who has had poor compliance with follow hooked with her PCP in reference to her history of hyperlipidemia. She is currently on Zocor 20 mg daily. In April of this year she had an STD and screen which was negative and an [place] was in the normal range. 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  Followup visit January 2014 patient underwent a detail colposcopic evaluation. With no abnormalities detected. Her Pap smear from that day was normal.  Patient has been suffering from bilateral painful varicose veins and would like to be referred to a vascular clinic.    Past medical history,surgical history, family history and social history were all reviewed and documented in the EPIC chart.  Gynecologic History No LMP recorded. Patient has had a hysterectomy. Contraception: status post hysterectomy Last Pap: 2014. Results were: normal Last mammogram: 2015. Results were: normal  Obstetric History OB History  Gravida Para Term Preterm AB SAB TAB Ectopic Multiple Living  3 3 3       3     # Outcome Date GA Lbr Len/2nd Weight Sex Delivery Anes PTL Lv  3 TRM     F SVD  N Y  2 TRM     F SVD  N N  1 TRM     M SVD  N Y       ROS: A ROS was performed and pertinent positives and negatives are included in the history.  GENERAL: No fevers or chills. HEENT: No change in vision, no earache, sore throat or sinus congestion. NECK: No pain or stiffness. CARDIOVASCULAR: No chest pain or pressure. No palpitations. PULMONARY: No shortness of breath, cough or wheeze. GASTROINTESTINAL: No abdominal pain, nausea, vomiting or diarrhea, melena or bright red blood per rectum. GENITOURINARY: No urinary frequency, urgency, hesitancy or dysuria. MUSCULOSKELETAL: No joint or muscle pain, no back pain, no recent trauma. DERMATOLOGIC: No rash, no itching, no lesions. ENDOCRINE: No polyuria, polydipsia, no heat or cold intolerance. No recent change in weight. HEMATOLOGICAL: No anemia or easy bruising or bleeding. NEUROLOGIC: No headache, seizures, numbness, tingling or weakness. PSYCHIATRIC: No depression, no loss of interest in normal activity or change in sleep pattern.     Exam: chaperone present  BP 110/70  Ht 5' 0.5" (1.537 m)  Wt 150 lb 6.4 oz (68.221 kg)  BMI 28.88 kg/m2  Body mass index is 28.88 kg/(m^2).  General appearance : Well developed well nourished female. No acute distress HEENT: Neck supple, trachea midline, no carotid bruits, no thyroidmegaly Lungs: Clear to auscultation, no rhonchi or wheezes, or rib retractions  Heart: Regular rate and rhythm, no murmurs or gallops Breast:Examined in sitting and supine position were symmetrical in appearance, no palpable masses or tenderness,  no skin retraction, no nipple inversion, no nipple discharge, no skin discoloration, no axillary or supraclavicular lymphadenopathy Abdomen: no palpable masses or tenderness, no rebound or guarding Extremities: no edema or  skin discoloration or tenderness  Pelvic:  Bartholin, Urethra, Skene Glands: Within normal limits             Vagina: No gross lesions or discharge  Cervix: Absent  Uterus absent Adnexa  Without masses or tenderness  Anus and perineum  normal   Rectovaginal  normal sphincter tone without palpated masses or  tenderness             Hemoccult not indicated     Assessment/Plan:  48 y.o. female for annual exam who has past history of cervical and vaginal dysplasia and hysterectomy as described above. Pap smear will be done today for close surveillance. She will return back to the office sometime next week and a fasting state for a fasting lipid profile along with a comprehensive metabolic panel to look at her liver function tests hole with a TSH, CBC and urinalysis. Patient was reminded of the importance of monthly breast exam. We discussed the importance of calcium vitamin D and regular exercise for osteoporosis prevention. Patient does not need a refill for her Zocor 20 mg daily.  Note: This dictation was prepared with  Dragon/digital dictation along withSmart phrase technology. Any transcriptional errors that result from this process are unintentional.   Terrance Mass MD, 4:50 PM 11/08/2013

## 2013-11-08 NOTE — Patient Instructions (Signed)
Ejercicios para perder peso (Exercise to Lose Weight) La actividad fsica y Ardelia Mems dieta saludable ayudan a perder peso. El mdico podr sugerirle ejercicios especficos. IDEAS Y CONSEJOS PARA HACER EJERCICIOS  Elija opciones econmicas que disfrute hacer , como caminar, andar en bicicleta o los vdeos para ejercitarse.   Utilice las Clinical cytogeneticist del ascensor.   Camine durante la hora del almuerzo.   Estacione el auto lejos del lugar de [place] o [place].   Concurra a un gimnasio o tome clases de gimnasia.   Comience con 5  10 minutos de actividad fsica por da. Ejercite hasta 30 minutos, 4 a 6 das por semana.   Utilice zapatos que tengan un buen soporte y ropas cmodas.   Elongue antes y despus de Chief Technology Officer.   Ejercite hasta que aumente la respiracin y el corazn palpite rpido.   Beba agua extra cuando ejercite.   No haga ejercicio Contractor, sentirse mareado o que le falte mucho el aire.  La actividad fsica puede quemar alrededor de 150 caloras.  Correr 20 cuadras en 15 minutos.   Jugar vley durante 45 a 60 minutos.   Limpiar y encerar el auto durante 45 a 60 minutos.   Jugar ftbol americano de toque.   Caminar 25 cuadras en 35 minutos.   Empujar un cochecito 20 cuadras en 30 minutos.   Jugar baloncesto durante 30 minutos.   Rastrillar hojas secas durante 30 minutos.   Andar en bicicleta 80 cuadras en 30 minutos.   Caminar 30 cuadras en 30 minutos.   Bailar durante 30 minutos.   Quitar la nieve con una pala durante 15 minutos.   Nadar vigorosamente durante 20 minutos.   Subir escaleras durante 15 minutos.   Andar en bicicleta 60 cuadras durante 15 minutos.   Arreglar el jardn entre 30 y 3 minutos.   Saltar a la soga durante 15 minutos.   Limpiar vidrios o pisos durante 45 a 60 minutos.  Document Released: 06/13/2010 Document Revised: 11/19/2010 [facility] Patient Information 2012 [place].                                                   Control del colesterol  Los niveles de colesterol en el organismo estn determinados significativamente por su dieta. Los niveles de colesterol tambin se relacionan con la enfermedad cardaca. El material que sigue ayuda a Event organiser relacin y a Physiological scientist qu puede hacer para mantener su corazn sano. No todo el colesterol es [place]. Las lipoprotenas de baja densidad (LDL) forman el colesterol "malo". El colesterol malo puede ocasionar depsitos de grasa que se acumulan en el interior de las arterias. Las lipoprotenas de alta densidad (HDL) es el colesterol "bueno". Ayuda a remover el colesterol LDL "malo" de la [place]. El colesterol es un factor de riesgo muy importante para la enfermedad cardaca. Otros factores de riesgo son la hipertensin arterial, el hbito de fumar, el estrs, la herencia y [place].   El msculo cardaco obtiene el suministro de sangre a travs de las arterias coronarias. Si su colesterol LDL ("malo") est elevado y el HDL ("bueno") es bajo, tiene un factor de riesgo para que se formen depsitos de Lobbyist en las arterias coronarias (los vasos sanguneos que suministran sangre al corazn). Esto hace que haya menos lugar para que la sangre circule. Sin la suficiente sangre y  oxgeno, el msculo cardaco no puede funcionar correctamente, y usted podr sentir dolores en el pecho (angina pectoris). Cuando una arteria coronaria se cierra completamente, una parte del msculo cardaco puede morir (infarto de miocardio).  CONTROL DEL COLESTEROL Cuando el profesional que lo asiste enva la sangre al laboratorio para Armed forces logistics/support/administrative officer nivel de colesterol, puede realizarle tambin un perfil completo de los lpidos. Con esta prueba, se puede determinar la cantidad total de colesterol, as como los niveles de LDL y HDL. Los triglicridos son un tipo de grasa que circula en la sangre y que tambin puede utilizarse para determinar el riesgo de enfermedad cardaca. En la siguiente tabla se  establecen los nmeros ideales: Prueba: Colesterol total  Menos de 200 mg/dl.  Prueba: LDL "colesterol malo"  Menos de 100 mg/dl.   Menos de 70 mg/dl si tiene riesgo muy elevado de sufrir un ataque cardaco o muerte cardaca sbita.  Prueba: HDL "colesterol bueno"  Mujeres: Ms de 50 mg/dl.   Hombres: Ms de 40 mg/dl.  Prueba: Trigliceridos  Menos de 601 mg/dl.    CONTROL DEL COLESTEROL CON DIETA Aunque factores como el ejercicio y el estilo de vida son importantes, la "primera lnea de ataque" es la dieta. Esto se debe a que se sabe que ciertos alimentos hacen subir el colesterol y otros lo [place]. El objetivo debe ser [employer] alimentos, de modo que tengan un efecto sobre el colesterol y, an ms importante, Training and development officer las grasas saturadas y trans con otros tipos de grasas, como las monoinsaturadas y las poliinsaturadas y cidos grasos omega-3 . En promedio, una persona no debe consumir ms de 15 a 17 g de grasas saturadas por [employer]. Las grasas saturadas y trans se consideran grasas "malas", ya que elevan el colesterol LDL. Las grasas saturadas se encuentran principalmente en productos animales como carne, [place] y crema. Pero esto no significa que usted Regulatory affairs officer todas sus comidas favoritas. Actualmente, como lo muestra el cuadro que figura al final de este documento, hay sustitutos de buen sabor, bajos en grasas y en colesterol, para la mayora de los alimentos que a usted Biomedical engineer. Elija aquellos alimentos alternativos que sean bajos en grasas o sin grasas. Elija cortes de carne del cuarto trasero o lomo ya que estos cortes son los que tienen menor cantidad de grasa y Research officer, trade union. El pollo (sin piel), el pescado, la carne de ternera, y la [place] de [place] molida son excelentes opciones. Elimine las carnes [employer] o el salami. Los [employer] o nada de grasas saturadas. Cuando consuma carne [place], carne de aves de corral, o pescado, hgalo en  porciones de 85 gramos (3 onzas). Las grasas trans tambin se llaman "aceites parcialmente hidrogenados". Son aceites manipulados cientficamente de [place] que son slidos a Engineer, water, tienen una larga vida y Therapist, art sabor y la textura de los alimentos a los que se Pension scheme manager. Las grasas trans se encuentran en la [place], [place], crackers y alimentos horneados.  Para hornear y cocinar, el aceite es un excelente sustituto para la [place]. Los aceites monoinsaturados tienen un beneficio particular, ya que se cree que disminuyen el colesterol LDL (colesterol malo) y elevan el HDL. Deber evitar los aceites tropicales saturados como el de coco y el de [place].  Recuerde, adems, que puede comer sin restricciones los grupos de alimentos que son naturalmente libres de grasas saturadas y Physicist, medical trans, entre los que se incluyen el pescado, las frutas (excepto el aguacate), verduras, frijoles, cereales ([place], [place],  cuzcuz, trigo) y las pastas (sin salsas con crema)   IDENTIFIQUE LOS ALIMENTOS QUE DISMINUYEN EL COLESTEROL  Pueden disminuir el colesterol las fibras solubles que estn en las frutas, como las [place], en los vegetales como el brcoli, las patatas y las zanahorias; en las legumbres como frijoles, guisantes y Engineer, technical sales; y en los cereales como la cebada. Los alimentos fortificados con fitosteroles tambin Forensic psychologist. Debe consumir al menos 2 g de estos alimentos a diario para Software engineer de disminucin de [place].  En el supermercado, lea las etiquetas de los envases para identificar los alimentos bajos en grasas saturadas, libres de grasas trans y bajos en [place], . Elija quesos que tengan solo de 2 a 3 g de grasa saturada por onza (28,35 g). Use una margarina que no dae el corazn, [place] de grasas trans o aceite parcialmente hidrogenado. Al comprar alimentos horneados (galletitas dulces y Administrator) evite el aceite parcialmente hidrogenado. Los panes y bollos  debern ser de granos enteros (harina de maz o de avena entera, en lugar de "harina" o "harina enriquecida"). Compre sopas en lata que no sean cremosas, con bajo contenido de sal y sin grasas adicionadas.   TCNICAS DE PREPARACIN DE LOS ALIMENTOS  Nunca fra los alimentos en aceite abundante. Si debe frer, hgalo en poco aceite y removiendo [place], porque as se utilizan muy pocas grasas, o utilice un spray antiadherente. Cuando le sea posible, hierva, hornee o ase las carnes y cocine los vegetales al vapor. En vez de [employer] con mantequilla o [place], utilice limn y hierbas, pur de Archivist y canela (para las calabazas y batatas), yogurt y salsa descremados y aderezos para ensaladas bajos en contenido graso.   BAJO EN GRASAS SATURADAS / SUSTITUTOS BAJOS EN GRASA  [place]arnes / Grasas saturadas (g)  Evite: Bife, corte graso (3 oz/85 g) / 11 g   Elija: Bife, corte magro (3 oz/85 g) / 4 g   Evite: [place]a (3 oz/85 g) / 7 g   Elija:  [place] magra (3 oz/85 g) / 5 g   Evite: Jamn (3 oz/85 g) / 6 g   Elija:  Jamn magro (3 oz/85 g) / 2.4 g   Evite: Pollo, con piel (3 oz/85 g), Carne oscura / 4 g   Elija:  Pollo, sin piel (3 oz/85 g), Carne oscura / 2 g   Evite: Pollo, con piel (3 oz/85 g), Carne magra / 2.5 g   Elija: Pollo, sin piel (3 oz/85 g), Carne magra / 1 g  Lcteos / Grasas saturadas (g)  Evite: Leche entera (1 taza) / 5 g   Elija: Leche con bajo contenido de grasa, 2% (1 taza) / 3 g   Elija: Leche con bajo contenido de grasa, 1% (1 taza) / 1.5 g   Elija: Leche descremada (1 taza) / 0.3 g   Evite: Queso duro (1 oz/28 g) / 6 g   Elija: Queso descremado (1 oz/28 g) / 2-3 g   Evite: Queso cottage, 4% grasa (1 taza)/ 6.5 g   Elija: Queso cottage con bajo contenido de grasa, 1% grasa (1 taza)/ 1.5 g   Evite: Helado (1 taza) / 9 g   Elija: Sorbete (1 taza) / 2.5 g   Elija: Yogurt helado sin contenido de grasa (1 taza) / 0.3 g   Elija:  Barras de fruta congeladas / vestigios   Evite: Crema batida (1 cucharada) / 3.5 g   Elija: Batidos glac sin lcteos (1 cucharada) /  1 g  Condimentos / Grasas saturadas (g)  Evite: Mayonesa (1 cucharada) / 2 g   Elija: Mayonesa con bajo contenido de grasa (1 cucharada) / 1 g   Evite: [place] (1 cucharada) / 7 g   Elija: Margarina extra light (1 cucharada) / 1 g   Evite: Aceite de coco (1 cucharada) / 11.8 g   Elija: Aceite de oliva (1 cucharada) / 1.8 g   Elija: Aceite de maz (1 cucharada) / 1.7 g   Elija: Aceite de crtamo (1 cucharada) / 1.2 g   Elija: Aceite de girasol (1 cucharada) / 1.4 g   Elija: Aceite de soja (1 cucharada) / 2.4 g   Elija: Aceite de canola (1 cucharada) / 1 g  Document Released: 03/09/2005 Document Revised: 11/19/2010 [facility] Patient Information 2012 [place], [place].

## 2013-11-10 LAB — CYTOLOGY - PAP

## 2013-11-13 ENCOUNTER — Other Ambulatory Visit: Payer: BC Managed Care – PPO

## 2013-11-13 DIAGNOSIS — Z01419 Encounter for gynecological examination (general) (routine) without abnormal findings: Secondary | ICD-10-CM

## 2013-11-13 DIAGNOSIS — E785 Hyperlipidemia, unspecified: Secondary | ICD-10-CM

## 2013-11-13 LAB — CBC WITH DIFFERENTIAL/PLATELET
BASOS ABS: 0 10*3/uL (ref 0.0–0.1)
Basophils Relative: 0 % (ref 0–1)
EOS PCT: 2 % (ref 0–5)
Eosinophils Absolute: 0.1 10*3/uL (ref 0.0–0.7)
HCT: 39.7 % (ref 36.0–46.0)
Hemoglobin: 13.4 g/dL (ref 12.0–15.0)
LYMPHS PCT: 28 % (ref 12–46)
Lymphs Abs: 1.6 10*3/uL (ref 0.7–4.0)
MCH: 31.8 pg (ref 26.0–34.0)
MCHC: 33.8 g/dL (ref 30.0–36.0)
MCV: 94.3 fL (ref 78.0–100.0)
Monocytes Absolute: 0.3 10*3/uL (ref 0.1–1.0)
Monocytes Relative: 6 % (ref 3–12)
NEUTROS PCT: 64 % (ref 43–77)
Neutro Abs: 3.7 10*3/uL (ref 1.7–7.7)
Platelets: 260 10*3/uL (ref 150–400)
RBC: 4.21 MIL/uL (ref 3.87–5.11)
RDW: 13.3 % (ref 11.5–15.5)
WBC: 5.8 10*3/uL (ref 4.0–10.5)

## 2013-11-13 LAB — LIPID PANEL
CHOL/HDL RATIO: 3.5 ratio
Cholesterol: 165 mg/dL (ref 0–200)
HDL: 47 mg/dL (ref >39)
LDL Cholesterol: 96 mg/dL (ref 0–99)
Triglycerides: 110 mg/dL (ref <150)
VLDL: 22 mg/dL (ref 0–40)

## 2013-11-13 LAB — COMPREHENSIVE METABOLIC PANEL
ALT: 11 U/L (ref 0–35)
AST: 17 U/L (ref 0–37)
Albumin: 4.5 g/dL (ref 3.5–5.2)
Alkaline Phosphatase: 54 U/L (ref 39–117)
BILIRUBIN TOTAL: 1 mg/dL (ref 0.2–1.2)
BUN: 11 mg/dL (ref 6–23)
CO2: 22 mEq/L (ref 19–32)
CREATININE: 0.6 mg/dL (ref 0.50–1.10)
Calcium: 9.4 mg/dL (ref 8.4–10.5)
Chloride: 106 mEq/L (ref 96–112)
Glucose, Bld: 84 mg/dL (ref 70–99)
Potassium: 4.3 mEq/L (ref 3.5–5.3)
SODIUM: 138 meq/L (ref 135–145)
TOTAL PROTEIN: 7.1 g/dL (ref 6.0–8.3)

## 2013-11-13 LAB — TSH: TSH: 1.706 u[IU]/mL (ref 0.350–4.500)

## 2013-11-14 LAB — URINALYSIS W MICROSCOPIC + REFLEX CULTURE
Bacteria, UA: NONE SEEN
Bilirubin Urine: NEGATIVE
Casts: NONE SEEN
Glucose, UA: NEGATIVE mg/dL
HGB URINE DIPSTICK: NEGATIVE
Ketones, ur: NEGATIVE mg/dL
LEUKOCYTES UA: NEGATIVE
NITRITE: NEGATIVE
PROTEIN: NEGATIVE mg/dL
Specific Gravity, Urine: 1.016 (ref 1.005–1.030)
Urobilinogen, UA: 0.2 mg/dL (ref 0.0–1.0)
pH: 6 (ref 5.0–8.0)

## 2013-12-06 MED ORDER — FLUTICASONE 50 MCG/ACTUATION NASAL SPRAY, SUSP
50 mcg/actuation | Freq: Every day | NASAL | Status: AC
Start: 2013-12-06 — End: ?

## 2013-12-06 MED ORDER — ALBUTEROL SULFATE HFA 90 MCG/ACTUATION AEROSOL INHALER
90 mcg/actuation | Freq: Four times a day (QID) | RESPIRATORY_TRACT | Status: AC | PRN
Start: 2013-12-06 — End: ?

## 2013-12-06 MED ORDER — CETIRIZINE 10 MG TAB
10 mg | ORAL_TABLET | Freq: Every day | ORAL | Status: AC
Start: 2013-12-06 — End: ?

## 2013-12-06 MED ORDER — AMITRIPTYLINE 25 MG TAB
25 mg | ORAL_TABLET | Freq: Every evening | ORAL | Status: DC
Start: 2013-12-06 — End: 2019-07-03

## 2013-12-06 NOTE — Progress Notes (Signed)
HISTORY OF PRESENT ILLNESS  [name] [name] is a 48 y.o. female.  HPI Comments: Presents to establish care for mild persistent asthma, allergic rhinitis. She has had some wheezing today, which she attributes to her allergy symptoms, but this has been responsive to Albuterol. She is having more trouble with her allergies this fall than usual, but she hasn't used her Flonase since the spring (Zyrtec is usually adequate). She doesn't feel ill. She was seeing Pulmonary, but hasn't needed to return since early 2015. She is also concerned about frequent headaches since she was a teenager. She has squeezing-type headaches at least once a week, sometimes lasting for 2-3 days, not associated with nausea or photophobia. These can be up to 8-9/10 in intensity, but they do not keep her from working. Excedrin is often effective, but not always quickly. Her mother has a history of migraines.     Establish Care  Associated symptoms include headaches and shortness of breath. Pertinent negatives include no chest pain.   Asthma  Associated symptoms include headaches and shortness of breath. Pertinent negatives include no chest pain.       Past Medical History   Diagnosis Date   ??? Asthma    ??? Headache        Past Surgical History   Procedure Laterality Date   ??? Hx hysterectomy     ??? Hx cesarean section  1987, 1994, 1998   ??? Hx cholecystectomy  1998   ??? Hx appendectomy  2006   ??? Hx hysterectomy  2012       History   Smoking status   ??? Never Smoker    Smokeless tobacco   ??? Never Used       Family History   Problem Relation Age of Onset   ??? Heart Disease Mother    ??? Hypertension Mother    ??? Diabetes Maternal Aunt    ??? Cancer Maternal Grandmother      colon       Review of Systems   Constitutional: Negative for fever, chills and weight loss.   HENT: Positive for congestion.    Eyes: Negative for blurred vision and double vision.   Respiratory: Positive for shortness of breath and wheezing.     Cardiovascular: Negative for chest pain, palpitations and leg swelling.   Gastrointestinal: Negative for heartburn, nausea and vomiting.   Genitourinary: Negative for dysuria, urgency and frequency.   Musculoskeletal: Negative for back pain (recent fall).   Skin: Negative for itching and rash.   Neurological: Positive for headaches. Negative for tingling and focal weakness.   Endo/Heme/Allergies: Positive for environmental allergies.   Psychiatric/Behavioral: Negative for depression. The patient is not nervous/anxious and does not have insomnia.        BP 142/98 mmHg   Pulse 92   Temp(Src) 98.2 ??F (36.8 ??C) (Oral)   Resp 22   Ht  (1.626 m)   Wt 277 lb (125.646 kg)   BMI 47.52 kg/m2   SpO2 96%    Physical Exam   Constitutional: She is oriented to person, place, and time. She appears well-developed and well-nourished. No distress.   HENT:   Right Ear: Tympanic membrane, external ear and ear canal normal.   Left Ear: Tympanic membrane, external ear and ear canal normal.   Nose: Mucosal edema present.   Mouth/Throat: Oropharynx is clear and moist.   Eyes: Conjunctivae and EOM are normal. Pupils are equal, round, and reactive to light.   Neck: Neck supple. Carotid  bruit is not present. No thyromegaly present.   Cardiovascular: Normal rate, regular rhythm and intact distal pulses.  Exam reveals no gallop and no friction rub.    No murmur heard.  Pulmonary/Chest: Effort normal and breath sounds normal. No respiratory distress. She has no wheezes. She has no rales.   Abdominal: Soft. She exhibits no mass. There is no tenderness.   Musculoskeletal: She exhibits no edema.   Lymphadenopathy:     She has no cervical adenopathy.   Neurological: She is alert and oriented to person, place, and time. She has normal strength and normal reflexes. No cranial nerve deficit. She displays a negative Romberg sign. Gait normal.   Skin: Skin is warm and dry.   Psychiatric: She has a normal mood and affect. Her behavior is normal.  Judgment and thought content normal.       ASSESSMENT and PLAN    ICD-9-CM ICD-10-CM    1. Mild persistent asthma in adult without complication 493.90 J45.30 albuterol (PROAIR HFA) 90 mcg/actuation inhaler   2. Perennial allergic rhinitis with seasonal variation 477.9 J30.9 cetirizine (ZYRTEC) 10 mg tablet      fluticasone (FLONASE) 50 mcg/actuation nasal spray   3. Episodic tension-type headache, not intractable 339.11 G44.219 amitriptyline (ELAVIL) 25 mg tablet   4. Elevated BP 796.2 R03.0    5. Obesity, Class III, BMI 40-49.9 (morbid obesity) (HCC) 278.01 E66.01      Follow-up Disposition:  Return in about 4 weeks (around 01/03/2014).  the following changes in treatment are made: Add Amitriptyline for headaches; advised to use Flonase consistently during the spring and fall to forestall asthma exacerbations  She will get a flu shot at work later in the fall.  Will refer her back to Pulmonary if she continues to have trouble with her asthma  reviewed diet, exercise and weight control - DASH diet, portion control - she has joined a gym  reviewed medications and side effects in detail    Guardado has been given the following recommendations today due to her elevated BP reading: rescreen BP within a minimum of 4 weeks and lifestyle modifications to include: weight reduction, DASH eating plan and increase physical activity.  Plan of care reviewed - patient verbalize(s) understanding and agreement.

## 2013-12-06 NOTE — Patient Instructions (Signed)
Managing Your Allergies: After Your Visit  Your Care Instructions  Managing your allergies is an important part of staying healthy. Your doctor will help you find out what may be causing the allergies. Common causes of allergy symptoms are house dust and dust mites, animal dander, mold, and pollen.  As soon as you know what triggers your symptoms, try to reduce your exposure to your triggers. This can help prevent allergy symptoms, asthma, and other health problems.  Ask your doctor about allergy medicine or immunotherapy. These treatments may help reduce or prevent allergy symptoms.  Follow-up care is a key part of your treatment and safety. Be sure to make and go to all appointments, and call your doctor if you are having problems. It's also a good idea to know your test results and keep a list of the medicines you take.  How can you care for yourself at home?  ?? If you think that dust or dust mites are causing your allergies:  ?? Wash sheets, pillowcases, and other bedding every week in hot water.  ?? Use airtight, dust-proof covers for pillows, duvets, and mattresses. Avoid plastic covers, because they tend to tear quickly and do not "breathe." Wash according to the instructions.  ?? Remove extra blankets and pillows that you don't need.  ?? Use blankets that are machine-washable.  ?? Don't use home humidifiers. They can help mites live longer.  ?? Use air-conditioning. Change or clean all filters every month. Keep windows closed. Use high-efficiency air filters. Don't use window or attic fans, which draw dust into the air.  ?? If you're allergic to pet dander, keep pets outside or, at the very least, out of your bedroom. Old carpet and cloth-covered furniture can hold a lot of animal dander. You may need to replace them.  ?? Look for signs of cockroaches. Use cockroach baits to get rid of them. Then clean your home well.  ?? If you're allergic to mold, don't keep indoor plants, because molds can  grow in soil. Get rid of furniture, rugs, and drapes that smell musty. Check for mold in the bathroom.  ?? If you're allergic to pollen, stay inside when pollen counts are high.  ?? Don't smoke or let anyone else smoke in your house. Don't use fireplaces or wood-burning stoves. Avoid paint fumes, perfumes, and other strong odors.  When should you call for help?  Call 911 anytime you think you may need emergency care. For example, call if:  ?? You have symptoms of a severe allergic reaction. These may include:  ?? Sudden raised, red areas (hives) all over your body.  ?? Swelling of the throat, mouth, lips, or tongue.  ?? Trouble breathing.  ?? Passing out (losing consciousness). Or you may feel very lightheaded or suddenly feel weak, confused, or restless.  Watch closely for changes in your health, and be sure to contact your doctor if:  ?? Your allergies get worse.  ?? You need help controlling your allergies.  ?? You have questions about allergy testing.  ?? You do not get better as expected.   Where can you learn more?   Go to http://www.healthwise.net/[facility]  Enter L249 in the search box to learn more about "Managing Your Allergies: After Your Visit."   ?? 2006-2015 Healthwise, Incorporated. Care instructions adapted under license by [place] (which disclaims liability or warranty for this information). This care instruction is for use with your licensed healthcare professional. If you have questions about a medical condition or   this instruction, always ask your healthcare professional. Healthwise, Incorporated disclaims any warranty or liability for your use of this information.  Content Version: 10.5.422740; Current as of: May 12, 2013              Elevated Blood Pressure: After Your Visit  Your Care Instructions     Blood pressure is a measure of how hard the blood pushes against the walls of your arteries. It's normal for blood pressure to go up and down  throughout the day. But if it stays up over time, you have high blood pressure.  Two numbers tell you your blood pressure. The first number is the systolic pressure. It shows how hard the blood pushes when your heart is pumping. The second number is the diastolic pressure. It shows how hard the blood pushes between heartbeats, when your heart is relaxed and filling with blood. An ideal blood pressure in adults is less than 120/80 (say "120 over 80"). High blood pressure is 140/90 or higher.  The main test for high blood pressure is simple, fast, and painless. To diagnose high blood pressure, your doctor will test your blood pressure at different times. You may have to check your blood pressure at home if there is reason to think that the results in the doctor's office aren't accurate.  If you are diagnosed with high blood pressure, you can work with your doctor to make a long-term plan to manage it.  Follow-up care is a key part of your treatment and safety. Be sure to make and go to all appointments, and call your doctor if you are having problems. It's also a good idea to know your test results and keep a list of the medicines you take.  How can you care for yourself at home?  ?? Do not smoke. Smoking increases your risk for heart attack and stroke. If you need help quitting, talk to your doctor about stop-smoking programs and medicines. These can increase your chances of quitting for good.  ?? Stay at a healthy weight.  ?? Try to limit how much sodium you eat to less than 2,300 milligrams (mg) a day. Your doctor may ask you to try to eat less than 1,500 mg a day.  ?? Be physically active. Get at least 30 minutes of exercise on most days of the week. Walking is a good choice. You also may want to do other activities, such as running, swimming, cycling, or playing tennis or team sports.  ?? Avoid or limit alcohol. Talk to your doctor about whether you can drink any alcohol.   ?? Eat plenty of fruits, vegetables, and low-fat dairy products. Eat less saturated and total fats.  ?? Learn how to check your blood pressure at home.  When should you call for help?  Call your doctor now or seek immediate medical care if:  ?? Your blood pressure is much higher than normal (such as 180/110 or higher).  ?? You think high blood pressure is causing symptoms such as:  ?? Severe headache.  ?? Blurry vision.  Watch closely for changes in your health, and be sure to contact your doctor if:  ?? You do not get better as expected.   Where can you learn more?   Go to [facility]  Enter [id number] in the search box to learn more about "Elevated Blood Pressure: After Your Visit."   ?? 2006-2015 Healthwise, Incorporated. Care instructions adapted under license by [employer] (which disclaims liability or warranty for this  information). This care instruction is for use with your licensed healthcare professional. If you have questions about a medical condition or this instruction, always ask your healthcare professional. Healthwise, Incorporated disclaims any warranty or liability for your use of this information.  Content Version: 10.5.422740; Current as of: May 12, 2013              Starting a Weight Loss Plan: After Your Visit  Your Care Instructions  If you are thinking about losing weight, it can be hard to know where to start. Your doctor can help you set up a weight loss plan that best meets your needs. You may want to take a class on nutrition or exercise, or join a weight loss support group. If you have questions about how to make changes to your eating or exercise habits, ask your doctor about seeing a registered dietitian or an exercise specialist.  It can be a big challenge to lose weight. But you do not have to make huge changes at once. Make small changes, and stick with them. When those changes become habit, add a few more changes.   If you do not think you are ready to make changes right now, try to pick a date in the future. Make an appointment to see your doctor to discuss whether the time is right for you to start a plan.  Follow-up care is a key part of your treatment and safety. Be sure to make and go to all appointments, and call your doctor if you are having problems. It???s also a good idea to know your test results and keep a list of the medicines you take.  How can you care for yourself at home?  ?? Set realistic goals. Many people expect to lose much more weight than is likely. A weight loss of 5% to 10% of your body weight may be enough to improve your health.  ?? Get family and friends involved to provide support. Talk to them about why you are trying to lose weight, and ask them to help. They can help by participating in exercise and having meals with you, even if they may be eating something different.  ?? Find what works best for you. If you do not have time or do not like to cook, a program that offers meal replacement bars or shakes may be better for you. Or if you like to prepare meals, finding a plan that includes daily menus and recipes may be best.  ?? Ask your doctor about other health professionals who can help you achieve your weight loss goals.  ?? A dietitian can help you make healthy changes in your diet.  ?? An exercise specialist or personal trainer can help you develop a safe and effective exercise program.  ?? A counselor or psychiatrist can help you cope with issues such as depression, anxiety, or family problems that can make it hard to focus on weight loss.  ?? Consider joining a support group for people who are trying to lose weight. Your doctor can suggest groups in your area.   Where can you learn more?   Go to [email or web address]  Enter U357 in the search box to learn more about "Starting a Weight Loss Plan: After Your Visit."   ?? 2006-2015 Healthwise, Incorporated. Care instructions adapted under  license by [employer] (which disclaims liability or warranty for this information). This care instruction is for use with your licensed healthcare professional. If you have questions about a medical condition  or this instruction, always ask your healthcare professional. Healthwise, Incorporated disclaims any warranty or liability for your use of this information.  Content Version: 10.5.422740; Current as of: May 12, 2013              DASH Diet: After Your Visit  Your Care Instructions  The DASH diet is an eating plan that can help lower your blood pressure. DASH stands for Dietary Approaches to Stop Hypertension. Hypertension is high blood pressure.  The DASH diet focuses on eating foods that are high in calcium, potassium, and magnesium. These nutrients can lower blood pressure. The foods that are highest in these nutrients are fruits, vegetables, low-fat dairy products, nuts, seeds, and legumes. But taking calcium, potassium, and magnesium supplements instead of eating foods that are high in those nutrients does not have the same effect. The DASH diet also includes whole grains, fish, and poultry.  The DASH diet is one of several lifestyle changes your doctor may recommend to lower your high blood pressure. Your doctor may also want you to decrease the amount of sodium in your diet. Lowering sodium while following the DASH diet can lower blood pressure even further than just the DASH diet alone.  Follow-up care is a key part of your treatment and safety. Be sure to make and go to all appointments, and call your doctor if you are having problems. It's also a good idea to know your test results and keep a list of the medicines you take.  How can you care for yourself at home?  Following the DASH diet  ?? Eat 4 to 5 servings of fruit each day. A serving is 1 medium-sized piece of fruit, ?? cup chopped or canned fruit, 1/4 cup dried fruit, or 4 ounces  (?? cup) of fruit juice. Choose fruit more often than fruit juice.  ?? Eat 4 to 5 servings of vegetables each day. A serving is 1 cup of lettuce or raw leafy vegetables, ?? cup of chopped or cooked vegetables, or 4 ounces (?? cup) of vegetable juice. Choose vegetables more often than vegetable juice.  ?? Get 2 to 3 servings of low-fat and fat-free dairy each day. A serving is 8 ounces of milk, 1 cup of yogurt, or 1 ?? ounces of cheese.  ?? Eat 6 to 8 servings of grains each day. A serving is 1 slice of bread, 1 ounce of dry cereal, or ?? cup of cooked rice, pasta, or cooked cereal. Try to choose whole-grain products as much as possible.  ?? Limit lean meat, poultry, and fish to 2 servings each day. A serving is 3 ounces, about the size of a deck of cards.  ?? Eat 4 to 5 servings of nuts, seeds, and legumes (cooked dried beans, lentils, and split peas) each week. A serving is 1/3 cup of nuts, 2 tablespoons of seeds, or ?? cup of cooked beans or peas.  ?? Limit fats and oils to 2 to 3 servings each day. A serving is 1 teaspoon of vegetable oil or 2 tablespoons of salad dressing.  ?? Limit sweets and added sugars to 5 servings or less a week. A serving is 1 tablespoon jelly or jam, ?? cup sorbet, or 1 cup of lemonade.  ?? Eat less than 2,300 milligrams (mg) of sodium a day. If you have high blood pressure, diabetes, or chronic kidney disease, if you are African-American, or if you are older than age 5, try to limit the amount of sodium you eat  to less than 1,500 mg a day.  Tips for success  ?? Start small. Do not try to make dramatic changes to your diet all at once. You might feel that you are missing out on your favorite foods and then be more likely to not follow the plan. Make small changes, and stick with them. Once those changes become habit, add a few more changes.  ?? Try some of the following:  ?? Make it a goal to eat a fruit or vegetable at every meal and at snacks.  This will make it easy to get the recommended amount of fruits and vegetables each day.  ?? Try yogurt topped with fruit and nuts for a snack or healthy dessert.  ?? Add lettuce, tomato, cucumber, and onion to sandwiches.  ?? Combine a ready-made pizza crust with low-fat mozzarella cheese and lots of vegetable toppings. Try using tomatoes, squash, spinach, broccoli, carrots, cauliflower, and onions.  ?? Have a variety of cut-up vegetables with a low-fat dip as an appetizer instead of chips and dip.  ?? Sprinkle sunflower seeds or chopped almonds over salads. Or try adding chopped walnuts or almonds to cooked vegetables.  ?? Try some vegetarian meals using beans and peas. Add garbanzo or kidney beans to salads. Make burritos and tacos with mashed pinto beans or black beans.   Where can you learn more?   Go to [facility]  Enter H967 in the search box to learn more about "DASH Diet: After Your Visit."   ?? 2006-2015 Healthwise, Incorporated. Care instructions adapted under license by [employer] (which disclaims liability or warranty for this information). This care instruction is for use with your licensed healthcare professional. If you have questions about a medical condition or this instruction, always ask your healthcare professional. Healthwise, Incorporated disclaims any warranty or liability for your use of this information.  Content Version: 10.5.422740; Current as of: May 12, 2013

## 2013-12-07 ENCOUNTER — Ambulatory Visit (INDEPENDENT_AMBULATORY_CARE_PROVIDER_SITE_OTHER): Payer: BC Managed Care – PPO | Admitting: Family Medicine

## 2013-12-07 VITALS — BP 126/80 | HR 69 | Temp 98.0°F | Resp 16 | Ht 60.0 in | Wt 151.0 lb

## 2013-12-07 DIAGNOSIS — H1045 Other chronic allergic conjunctivitis: Secondary | ICD-10-CM

## 2013-12-07 DIAGNOSIS — H1013 Acute atopic conjunctivitis, bilateral: Secondary | ICD-10-CM

## 2013-12-07 DIAGNOSIS — M545 Low back pain, unspecified: Secondary | ICD-10-CM

## 2013-12-07 LAB — POCT UA - MICROSCOPIC ONLY
Casts, Ur, LPF, POC: NEGATIVE
Crystals, Ur, HPF, POC: NEGATIVE
Mucus, UA: NEGATIVE
Yeast, UA: NEGATIVE

## 2013-12-07 LAB — POCT URINALYSIS DIPSTICK
Bilirubin, UA: NEGATIVE
Glucose, UA: NEGATIVE
Ketones, UA: NEGATIVE
Leukocytes, UA: NEGATIVE
Nitrite, UA: NEGATIVE
Protein, UA: NEGATIVE
Spec Grav, UA: 1.02
Urobilinogen, UA: 0.2
pH, UA: 5.5

## 2013-12-07 MED ORDER — MELOXICAM 7.5 MG PO TABS: 7.5000 mg | ORAL_TABLET | Freq: Every day | ORAL | Status: DC

## 2013-12-07 MED ORDER — OLOPATADINE HCL 0.1 % OP SOLN: 1.0000 [drp] | Freq: Two times a day (BID) | OPHTHALMIC | Status: DC

## 2013-12-07 NOTE — Patient Instructions (Signed)
Dolor muscular (Muscle Pain) Las causas del dolor muscular (mialgia) pueden ser [place], entre ellas:  Uso excesivo del msculo o distensin muscular, en especial si la persona no est en buen estado fsico. Esta es la causa ms comn del dolor muscular.  Lesiones.  [place].  Virus, como el de la gripe.  Enfermedades infecciosas.  Fibromialgia, que es una afeccin crnica que causa dolor de [place], fatiga y dolor muscular con la palpacin.  Enfermedades autoinmunes, como el lupus.  Determinados medicamentos, como los inhibidores de la ECA y las estatinas. El dolor muscular puede ser leve o intenso. La mayora de las veces, el dolor dura solo un corto perodo y desaparece sin tratamiento. Para diagnosticar la causa del dolor muscular, el mdico le preguntar los antecedentes mdicos. Esto significa que le preguntar cundo comenz Physiological scientist y qu ha sucedido desde ese momento. Si el dolor muscular no comenz hace mucho tiempo, el mdico puede esperar antes de hacer diversas pruebas. Si el dolor muscular comenz hace mucho tiempo, el mdico puede decidir que es ms conveniente hacer pruebas de inmediato. Si el mdico cree que Physiological scientist puede estar causado por una enfermedad, es posible que necesite hacer pruebas adicionales para descartar determinadas afecciones.  El tratamiento del dolor muscular depende de la causa. El cuidado en el hogar a menudo es suficiente para Stage manager. El mdico tambin puede recetarle un medicamento antinflamatorio. INSTRUCCIONES PARA EL CUIDADO EN EL HOGAR Controle su afeccin para ver si hay cambios. Las siguientes indicaciones ayudarn a Chief Strategy Officer que pueda sentir:  [employer] medicamentos de venta libre o recetados solamente segn las indicaciones del mdico.  Aplique hielo en el msculo dolorido:  Ponga el hielo en una bolsa plstica.  Colquese una toalla entre la piel y la bolsa de hielo.  Aplique el  hielo de 3 a 4 veces por da durante 15 a 49minutos.  Puede alternar la colocacin de compresas calientes y fras en el msculo segn lo que le haya indicado el mdico.  Si el uso excesivo del msculo le est causando dolor muscular, disminuya las actividades hasta que el dolor desaparezca.  Recuerde que es normal sentir algo de dolor muscular despus de comenzar un programa de entrenamiento. Generalmente duelen aquellos msculos que no se utilizan con frecuencia.  Si usted no hace actividad fsica con frecuencia, los ejercicios deben ser suaves y regulares.  Para reducir el riesgo de dolor muscular, haga un precalentamiento antes de la actividad fsica.  No siga haciendo actividad fsica si el dolor es muy intenso. Este tipo de dolor podra indicar que se ha lesionado un msculo. SOLICITE ATENCIN MDICA SI:  El Marketing executive empeora y los medicamentos no surten [place].  Tiene dolor muscular que dura ms de 3das.  Tiene una erupcin cutnea o fiebre junto con el dolor muscular.  Tiene dolor muscular despus de una picadura de garrapata.  Tiene dolor muscular mientras hace actividad fsica, aunque est en buen estado fsico.  Tiene enrojecimiento, sensibilidad o hinchazn junto con el dolor muscular.  Tiene dolor muscular despus de comenzar un medicamento nuevo o de cambiar la dosis de un medicamento. SOLICITE ATENCIN MDICA DE INMEDIATO SI:  Tiene dificultad para respirar.  Presenta dificultad para tragar.  Tiene dolor muscular junto con rigidez en el cuello, fiebre y vmitos.  Tiene debilidad muscular intensa o no puede mover una parte del cuerpo. ASEGRESE DE QUE:   Comprende estas instrucciones.  Controlar su afeccin.  Recibir [place] de inmediato  si no mejora o si empeora. Document Released: 06/16/2007 Document Revised: 03/14/2013 [facility] Patient Information 2015 [place]. This information is not intended to replace advice given to you by your health  care provider. Make sure you discuss any questions you have with your health care provider.

## 2013-12-07 NOTE — Progress Notes (Signed)
Subjective:    Patient ID: Natalie Martinez, female    DOB: 11/02/1965, 48 y.o.   MRN: [id number] This chart was scribed for Natalie Haber, MD by Martinique Peace, ED Scribe. The patient was seen in [facility]. The patient's care was started at 7:07 PM.  HPI HPI Comments: Natalie Martinez is a 48 y.o. female who presents to the [facility] lower back pain onset 2 weeks with associated increased frequency in urination that she reports is exacerbated by some movement of torso and driving. Pt reports that she fell a month ago but was able to get up afterwards. 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 Pt is here with her daughter. She denies any fever, burning on urination, or SOB.    Patient Active Problem List   Diagnosis Date Noted   Varicose veins of lower extremities with other complications 24/26/8341   ASCUS (atypical squamous cells of undetermined significance) on Pap smear 04/06/2012   Weight gain 11/06/2011   VAIN I (vaginal intraepithelial neoplasia grade I) 04/03/2011   Condyloma acuminatum in female 04/03/2011   Hypercholesterolemia    ACUTE CYSTITIS 07/01/2009   HYPERLIPIDEMIA 05/27/2009   CONSTIPATION 03/21/2008   MICROSCOPIC HEMATURIA 03/21/2008   Past Medical History  Diagnosis Date   NSVD (normal spontaneous vaginal delivery)     X3   Hypercholesterolemia    Allergy    Past Surgical History  Procedure Laterality Date   Cervical biopsy  w/ loop electrode excision  2004  &  2005    X 2   Dilation and curettage of uterus  2000 & 2003   Vaginal hysterectomy  2005    RECURRENT CERVICAL DYSPLASIA   No Known Allergies Prior to Admission medications   Medication Sig Start Date End Date Taking? Authorizing Provider  Prenatal MV-Min-Fe Fum-FA-DHA (PRENATAL 1 PO) Take by mouth.   Yes Historical Provider, MD  simvastatin (ZOCOR) 20 MG tablet TAKE 1 TABLET BY MOUTH EVERY EVENING 11/28/12  Yes Terrance Mass, MD   History   Social History   Marital  Status: Single    Spouse Name: N/A    Number of Children: N/A   Years of Education: N/A   Occupational History   Not on file.   Social History Main Topics   Smoking status: Never Smoker    Smokeless tobacco: Never Used   Alcohol Use: No   Drug Use: No   Sexual Activity: Yes    Museum/gallery curator: None, Surgical   Other Topics Concern   Not on file   Social History Narrative   No narrative on file     Review of Systems  Respiratory: Negative for shortness of breath.   Genitourinary: Positive for frequency. Negative for dysuria.  Skin: Negative for rash.       Objective:   Physical Exam  Nursing note and vitals reviewed. Constitutional: She is oriented to person, place, and time. She appears well-developed and well-nourished. No distress.  HENT:  Head: Normocephalic and atraumatic.  Eyes: Conjunctivae and EOM are normal.  Neck: Neck supple. No tracheal deviation present.  Cardiovascular: Normal rate.   Pulmonary/Chest: Effort normal. No respiratory distress.  Abdominal: Soft. Bowel sounds are normal. There is no tenderness.  Musculoskeletal: Normal range of motion.  Back non-tender. Some muscle soreness in calves.   Neurological: She is alert and oriented to person, place, and time.  Skin: Skin is warm and dry. No rash noted.  Psychiatric: She has a normal  mood and affect. Her behavior is normal.      Filed Vitals:   12/07/13 1856  BP: 126/80  Pulse: 69  Temp: 98 F (36.7 C)  TempSrc: Oral  Resp: 16  Height: 5' (1.524 m)  Weight: 151 lb (68.493 kg)  SpO2: 100%   Results for orders placed in visit on 12/07/13  POCT UA - MICROSCOPIC ONLY      Result Value Ref Range   WBC, Ur, HPF, POC 0-2     RBC, urine, microscopic 2-4     Bacteria, U Microscopic trace     Mucus, UA neg     Epithelial cells, urine per micros 2-5     Crystals, Ur, HPF, POC neg     Casts, Ur, LPF, POC neg     Yeast, UA neg    POCT URINALYSIS DIPSTICK      Result  Value Ref Range   Color, UA yellow     Clarity, UA hazy     Glucose, UA neg     Bilirubin, UA neg     Ketones, UA neg     Spec Grav, UA 1.020     Blood, UA moderate     pH, UA 5.5     Protein, UA neg     Urobilinogen, UA 0.2     Nitrite, UA neg     Leukocytes, UA Negative        Assessment & Plan:    Bilateral low back pain without sciatica - Plan: POCT UA - Microscopic Only, POCT urinalysis dipstick, meloxicam (MOBIC) 7.5 MG tablet  Allergic conjunctivitis, bilateral - Plan: olopatadine (PATANOL) 0.1 % ophthalmic solution  Signed, Natalie Haber, MD   Signed, Natalie Haber, MD

## 2013-12-15 NOTE — Addendum Note (Signed)
Addended by: Gabriel Carina on: 12/15/2013 03:32 PM      Modules accepted: Orders

## 2013-12-15 NOTE — Progress Notes (Signed)
Subjective:   48 y.o. female for annual routine Pap and checkup.  No LMP recorded. Patient has had a hysterectomy. (Supracervical Hysterectomy)    Social History: single partner, contraception - status post hysterectomy.  Pertinent past medical hstory: no history of HTN, DVT, CAD, DM, liver disease, migraines or smoking.         ROS:  Feeling well. No dyspnea or chest pain on exertion.  No abdominal pain, change in bowel habits, black or bloody stools.  No urinary tract symptoms. GYN ROS: no breast pain or new or enlarging lumps on self exam, no vaginal bleeding, no discharge or pelvic pain, no hot flashes. No neurological complaints.    Objective:   BP 138/87 mmHg   Pulse 98   Ht  (1.626 m)   Wt 272 lb 6.4 oz (123.56 kg)   BMI 46.73 kg/m2  The patient appears well, alert, oriented x 3, in no distress.  ENT normal.  Neck supple. No adenopathy or thyromegaly. PERLA. Lungs are clear, good air entry, no wheezes, rhonchi or rales. S1 and S2 normal, no murmurs, regular rate and rhythm. Abdomen soft without tenderness, guarding, mass or organomegaly. Extremities show no edema, normal peripheral pulses. Neurological is normal, no focal findings.    BREAST EXAM: breasts appear normal, no suspicious masses, no skin or nipple changes or axillary nodes    PELVIC EXAM: VULVA: normal appearing vulva with no masses, tenderness or lesions, VAGINA: normal appearing vagina with normal color and discharge, no lesions, CERVIX: normal appearing cervix without discharge or lesions, ADNEXA: normal adnexa in size, nontender and no masses    Assessment/Plan:   well woman  RTC-1 yr  pap smear  return annually or prn  .

## 2013-12-15 NOTE — Patient Instructions (Signed)
Well Visit, Ages 46 to 50: After Your Visit  Your Care Instructions  Physical exams can help you stay healthy. Your doctor has checked your overall health and may have suggested ways to take good care of yourself. He or she also may have recommended tests. At home, you can help prevent illness with healthy eating, regular exercise, and other steps.  Follow-up care is a key part of your treatment and safety. Be sure to make and go to all appointments, and call your doctor if you are having problems. It's also a good idea to know your test results and keep a list of the medicines you take.  How can you care for yourself at home?  ?? Reach and stay at a healthy weight. This will lower your risk for many problems, such as obesity, diabetes, heart disease, and high blood pressure.  ?? Get at least 30 minutes of physical activity on most days of the week. Walking is a good choice. You also may want to do other activities, such as running, swimming, cycling, or playing tennis or team sports. Discuss any changes in your exercise program with your doctor.  ?? Do not smoke or allow others to smoke around you. If you need help quitting, talk to your doctor about stop-smoking programs and medicines. These can increase your chances of quitting for good.  ?? Talk to your doctor about whether you have any risk factors for sexually transmitted infections (STIs). Having one sex partner (who does not have STIs and does not have sex with anyone else) is a good way to avoid these infections.  ?? Use birth control if you do not want to have children at this time. Talk with your doctor about the choices available and what might be best for you.  ?? Always wear sunscreen on exposed skin. Make sure the sunscreen blocks ultraviolet rays (both UVA and UVB) and has a sun protection factor (SPF) of at least 15. Use it every day, even when it is cloudy. Some doctors may recommend a higher SPF, such as 30.   ?? See a dentist one or two times a year for checkups and to have your teeth cleaned.  ?? Wear a seat belt in the car.  ?? Drink alcohol in moderation, if at all. That means no more than 2 drinks a day for men and 1 drink a day for women.  Follow your doctor's advice about when to have certain tests. These tests can spot problems early.  For everyone  ?? Cholesterol. Have the fat (cholesterol) in your blood tested after age 20. Your doctor will tell you how often to have this done based on your age, family history, or other things that can increase your risk for heart disease.  ?? Blood pressure. Experts suggest that healthy adults with normal blood pressure (119/79 mm Hg or below) have their blood pressure checked at least every 1 to 2 years. This can be done during a routine doctor visit. If you have slightly higher or high blood pressure, your doctor will suggest more frequent tests.  ?? Vision. Talk with your doctor about how often to have a glaucoma test.  ?? Diabetes. Ask your doctor whether you should have tests for diabetes.  ?? Colon cancer. Have a test for colon cancer at age 46. You may have one of several tests. If you are younger than 50, you may need a test earlier if you have any risk factors. Risk factors include whether you already   had a precancerous polyp removed from your colon or whether your parent, brother, sister, or child has had colon cancer.  For women  ?? Breast exam and mammogram. Talk to your doctor about when you should have a clinical breast exam and a mammogram. Medical experts differ on whether and how often women under 50 should have these tests. Your doctor can help you decide what is right for you.  ?? Pap test and pelvic exam. Begin Pap tests at age 21. A Pap test is the best way to find cervical cancer. The test often is part of a pelvic exam. Ask how often to have this test.  ?? Tests for sexually transmitted infections (STIs). Ask whether you should  have tests for STIs. You may be at risk if you have sex with more than one person, especially if your partners do not wear condoms.  For men  ?? Tests for sexually transmitted infections (STIs). Ask whether you should have tests for STIs. You may be at risk if you have sex with more than one person, especially if you do not wear a condom.  ?? Testicular cancer exam. Ask your doctor whether you should check your testicles regularly.  ?? Prostate exam. Talk to your doctor about whether you should have a blood test (called a PSA test) for prostate cancer. Experts differ on whether and when men should have this test. Some experts suggest it if you are older than 46 and are African-American or have a father or brother who got prostate cancer when he was younger than 65.  When should you call for help?  Watch closely for changes in your health, and be sure to contact your doctor if you have any problems or symptoms that concern you.   Where can you learn more?   Go to http://www.healthwise.net/[facility]  Enter P072 in the search box to learn more about "Well Visit, Ages 46 to 50: After Your Visit."   ?? 2006-2015 Healthwise, Incorporated. Care instructions adapted under license by [employer] (which disclaims liability or warranty for this information). This care instruction is for use with your licensed healthcare professional. If you have questions about a medical condition or this instruction, always ask your healthcare professional. Healthwise, Incorporated disclaims any warranty or liability for your use of this information.  Content Version: 10.5.422740; Current as of: May 12, 2013

## 2013-12-20 LAB — CHLAMYDIA/GC PCR
Chlamydia trachomatis, NAA: NEGATIVE
Neisseria gonorrhoeae, NAA: NEGATIVE

## 2013-12-29 ENCOUNTER — Ambulatory Visit (INDEPENDENT_AMBULATORY_CARE_PROVIDER_SITE_OTHER): Payer: BC Managed Care – PPO

## 2013-12-29 ENCOUNTER — Ambulatory Visit (INDEPENDENT_AMBULATORY_CARE_PROVIDER_SITE_OTHER): Payer: BC Managed Care – PPO | Admitting: Family Medicine

## 2013-12-29 VITALS — BP 110/70 | HR 64 | Temp 98.7°F | Resp 16 | Ht 61.5 in | Wt 154.2 lb

## 2013-12-29 DIAGNOSIS — M549 Dorsalgia, unspecified: Secondary | ICD-10-CM

## 2013-12-29 DIAGNOSIS — R3129 Other microscopic hematuria: Secondary | ICD-10-CM

## 2013-12-29 DIAGNOSIS — R312 Other microscopic hematuria: Secondary | ICD-10-CM

## 2013-12-29 DIAGNOSIS — M545 Low back pain, unspecified: Secondary | ICD-10-CM

## 2013-12-29 DIAGNOSIS — M546 Pain in thoracic spine: Secondary | ICD-10-CM

## 2013-12-29 DIAGNOSIS — R109 Unspecified abdominal pain: Secondary | ICD-10-CM

## 2013-12-29 LAB — POCT UA - MICROSCOPIC ONLY
Bacteria, U Microscopic: NEGATIVE
Casts, Ur, LPF, POC: NEGATIVE
Crystals, Ur, HPF, POC: NEGATIVE
Mucus, UA: NEGATIVE
WBC, Ur, HPF, POC: NEGATIVE
Yeast, UA: NEGATIVE

## 2013-12-29 LAB — POCT URINALYSIS DIPSTICK
Bilirubin, UA: NEGATIVE
Glucose, UA: NEGATIVE
Ketones, UA: NEGATIVE
Leukocytes, UA: NEGATIVE
Nitrite, UA: NEGATIVE
Protein, UA: NEGATIVE
Spec Grav, UA: 1.015
Urobilinogen, UA: 0.2
pH, UA: 6

## 2013-12-29 MED ORDER — CYCLOBENZAPRINE HCL 5 MG PO TABS: 5.0000 mg | ORAL_TABLET | Freq: Every evening | ORAL | Status: DC | PRN

## 2013-12-29 MED ORDER — HYDROCODONE-ACETAMINOPHEN 5-325 MG PO TABS: 1.0000 | ORAL_TABLET | Freq: Four times a day (QID) | ORAL | Status: DC | PRN

## 2013-12-29 NOTE — Progress Notes (Signed)
 Chief Complaint:  Chief Complaint  Patient presents with  . Follow-up    back pain still having    HPI: Natalie Martinez is a 48 y.o. female who is here for  Low back pain recheck Sheis still having back pain and also upper back pain that moves when she moves.  She was seen here on 12/07/13 and was given mobic, she did not feel like it improved her back pain. Please see note below. Still denies any n/wt/incontinence. NKI.    Natalie Martinez is a 48 y.o. female who presents to the [facility] lower back pain onset 2 weeks with associated increased frequency in urination that she reports is exacerbated by some movement of torso and driving. Pt reports that she fell a month ago but was able to get up afterwards. 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 Pt is here with her daughter. She denies any fever, burning on urination, or SOB.   She can't sleep, she feel slike her back is broken to pieces, it is her right isde     Past Medical History  Diagnosis Date  . NSVD (normal spontaneous vaginal delivery)     X3  . Hypercholesterolemia   . Allergy    Past Surgical History  Procedure Laterality Date  . Cervical biopsy  w/ loop electrode excision  2004  &  2005    X 2  . Dilation and curettage of uterus  2000 & 2003  . Vaginal hysterectomy  2005    RECURRENT CERVICAL DYSPLASIA   History   Social History  . Marital Status: Single    Spouse Name: N/A    Number of Children: N/A  . Years of Education: N/A   Social History Main Topics  . Smoking status: Never Smoker   . Smokeless tobacco: Never Used  . Alcohol Use: No  . Drug Use: No  . Sexual Activity: Yes    Birth Control/ Protection: None, Surgical   Other Topics Concern  . None   Social History Narrative  . None   Family History  Problem Relation Age of Onset  . Diabetes Mother    No Known Allergies Prior to Admission medications   Medication Sig Start Date End Date Taking? Authorizing Provider    meloxicam (MOBIC) 7.5 MG tablet Take 1 tablet (7.5 mg total) by mouth daily. 12/07/13  Yes Robyn Haber, MD  Prenatal MV-Min-Fe Fum-FA-DHA (PRENATAL 1 PO) Take by mouth.   Yes Historical Provider, MD  simvastatin (ZOCOR) 20 MG tablet TAKE 1 TABLET BY MOUTH EVERY EVENING 11/28/12  Yes Terrance Mass, MD     ROS: The patient denies fevers, chills, night sweats, unintentional weight loss, chest pain, palpitations, wheezing, dyspnea on exertion, nausea, vomiting, abdominal pain, dysuria, hematuria, melena, numbness, weakness, or tingling.   All other systems have been reviewed and were otherwise negative with the exception of those mentioned in the HPI and as above.    PHYSICAL EXAM: Filed Vitals:   12/29/13 1801  BP: 110/70  Pulse: 64  Temp: 98.7 F (37.1 C)  Resp: 16   Filed Vitals:   12/29/13 1801  Height: 5' 1.5" (1.562 m)  Weight: 154 lb 3.2 oz (69.945 kg)   Body mass index is 28.67 kg/(m^2).  General: Alert, no acute distress HEENT:  Normocephalic, atraumatic, oropharynx patent. EOMI, PERRLA Cardiovascular:  Regular rate and rhythm, no rubs murmurs or gallops.  No Carotid bruits, radial pulse intact. No  pedal edema.  Respiratory: Clear to auscultation bilaterally.  No wheezes, rales, or rhonchi.  No cyanosis, no use of accessory musculature GI: No organomegaly, abdomen is soft and non-tender, positive bowel sounds.  No masses. Skin: No rashes. Neurologic: Facial musculature symmetric. Psychiatric: Patient is appropriate throughout our interaction. Lymphatic: No cervical lymphadenopathy Musculoskeletal: Gait intact. + paramsk tenderness right upper thoracic, midline Low back Full ROM with pain 5/5 strength, 2/2 DTRs No saddle anesthesia Straight leg negative Hip and knee exam--normal    LABS: Results for orders placed in visit on 12/29/13  COMPTE METABOLIC PANEL WITH GFR      Result Value Ref Range   Sodium 138  135 - 145 mEq/L   Potassium 4.3  3.5 - 5.3 mEq/L    Chloride 104  96 - 112 mEq/L   CO2 24  19 - 32 mEq/L   Glucose, Bld 84  70 - 99 mg/dL   BUN 10  6 - 23 mg/dL   Creat 0.67  0.50 - 1.10 mg/dL   Total Bilirubin 0.4  0.2 - 1.2 mg/dL   Alkaline Phosphatase 55  39 - 117 U/L   AST 17  0 - 37 U/L   ALT 11  0 - 35 U/L   Total Protein 7.2  6.0 - 8.3 g/dL   Albumin 4.5  3.5 - 5.2 g/dL   Calcium 9.5  8.4 - 10.5 mg/dL   GFR, Est African American >89     GFR, Est Non African American >89    POCT UA - MICROSCOPIC ONLY      Result Value Ref Range   WBC, Ur, HPF, POC neg     RBC, urine, microscopic 0-1     Bacteria, U Microscopic neg     Mucus, UA neg     Epithelial cells, urine per micros 0-1     Crystals, Ur, HPF, POC neg     Casts, Ur, LPF, POC neg     Yeast, UA neg    POCT URINALYSIS DIPSTICK      Result Value Ref Range   Color, UA yellow     Clarity, UA clear     Glucose, UA neg     Bilirubin, UA neg     Ketones, UA neg     Spec Grav, UA 1.015     Blood, UA trace-lysed     pH, UA 6.0     Protein, UA neg     Urobilinogen, UA 0.2     Nitrite, UA neg     Leukocytes, UA Negative       EKG/XRAY:   Primary read interpreted by Dr. Marin Comment at [facility]. Neg fracture or dislocation   ASSESSMENT/PLAN: Encounter Diagnoses  Name Primary?  Marland Kitchen Upper back pain   . Midline low back pain without sciatica Yes  . Flank pain   . Hematuria, microscopic    Xrays were negative Cont with mobic, rx norco and flexeril and back exercises given She has trace hematuria in her urine, she had larger amoutn sof micropscopic heamturia on 12/07/13. I advise her to return tot he office to get that checked in 2-4 weeks.  CMP pending  Gross sideeffects, risk and benefits, and alternatives of medications d/w patient. Patient is aware that all medications have potential sideeffects and we are unable to predict every sideeffect or drug-drug interaction that may occur.  , [place], DO 01/01/2014 6:50 AM

## 2013-12-30 LAB — COMPLETE METABOLIC PANEL WITH GFR
ALT: 11 U/L (ref 0–35)
AST: 17 U/L (ref 0–37)
Alkaline Phosphatase: 55 U/L (ref 39–117)
CO2: 24 mEq/L (ref 19–32)
Creat: 0.67 mg/dL (ref 0.50–1.10)
Sodium: 138 mEq/L (ref 135–145)
Total Bilirubin: 0.4 mg/dL (ref 0.2–1.2)
Total Protein: 7.2 g/dL (ref 6.0–8.3)

## 2013-12-30 LAB — COMPLETE METABOLIC PANEL WITHOUT GFR
Albumin: 4.5 g/dL (ref 3.5–5.2)
BUN: 10 mg/dL (ref 6–23)
Calcium: 9.5 mg/dL (ref 8.4–10.5)
Chloride: 104 meq/L (ref 96–112)
GFR, Est African American: >89 mL/min
GFR, Est Non African American: >89 mL/min
Glucose, Bld: 84 mg/dL (ref 70–99)
Potassium: 4.3 meq/L (ref 3.5–5.3)

## 2014-01-03 ENCOUNTER — Encounter: Attending: Family Medicine | Primary: Internal Medicine

## 2014-01-05 ENCOUNTER — Telehealth: Payer: Self-pay | Admitting: Family Medicine

## 2014-01-05 NOTE — Telephone Encounter (Signed)
Spoke withpt CMP was normal

## 2014-06-13 ENCOUNTER — Ambulatory Visit (INDEPENDENT_AMBULATORY_CARE_PROVIDER_SITE_OTHER): Payer: 59 | Admitting: Sports Medicine

## 2014-06-13 VITALS — BP 120/78 | HR 77 | Temp 98.3°F | Resp 16 | Ht 61.0 in | Wt 150.6 lb

## 2014-06-13 DIAGNOSIS — J301 Allergic rhinitis due to pollen: Secondary | ICD-10-CM | POA: Diagnosis not present

## 2014-06-13 DIAGNOSIS — E785 Hyperlipidemia, unspecified: Secondary | ICD-10-CM

## 2014-06-13 DIAGNOSIS — H04123 Dry eye syndrome of bilateral lacrimal glands: Secondary | ICD-10-CM

## 2014-06-13 MED ORDER — SIMVASTATIN 20 MG PO TABS
20.0000 mg | ORAL_TABLET | Freq: Every evening | ORAL | Status: DC
Start: 2014-06-13 — End: 2014-12-20

## 2014-06-13 MED ORDER — AZELASTINE HCL 0.05 % OP SOLN: 1.0000 [drp] | Freq: Two times a day (BID) | OPHTHALMIC | Status: DC

## 2014-06-13 MED ORDER — MONTELUKAST SODIUM 10 MG PO TABS: 10.0000 mg | ORAL_TABLET | Freq: Every day | ORAL | Status: DC

## 2014-06-13 MED ORDER — FLUTICASONE PROPIONATE 50 MCG/ACT NA SUSP: 2.0000 | Freq: Every day | NASAL | Status: DC

## 2014-06-13 NOTE — Progress Notes (Signed)
  Natalie Martinez - 49 y.o. female MRN [id number]  Date of birth: 1965/12/17  SUBJECTIVE:  Including CC & ROS.  URI HPI: Onset of symptoms: 3 Days Symptoms include: yes Nasal congestion, yes nasal drainage , color of drainage is green, yes sore throat, some fullness in the ears , yes sinus pressure, yes headache, no fever, no chills, no bodyache, some fatigue,  yes cough, no mucous, no SOB. No history of tobacco use, no history of asthma, Yes seasonal allergies- reports trying Zyrtec, Allegra, Claritin the past with poor control of allergy symptoms. no history of COPD. Denies Nausea, vomiting, diarrhea.  Appetite yes, and Drinking fluids Relieving factors: nothing Symptoms not improving but no worse Vital signs reviewed: Normal respirations, normal pulse ox, normal temperature, normal pulse  Needs refill of her eyedrops for dry eye Needs refill for simvastatin for hyperlipidemia  ROS:  Constitutional:  No fever, chills, or fatigue.  Respiratory:  No shortness of breath, cough, or wheezing Cardiovascular:  No palpitations, chest pain or syncope Gastrointestinal:  No nausea, no abdominal pain Review of systems otherwise negative except for what is stated in HPI  HISTORY: Past Medical, Surgical, Social, and Family History Reviewed & Updated per EMR. Pertinent Historical Findings include: Hyperlipidemia Chronic allergic rhinitis failed treatment with over-the-counter agents  PHYSICAL EXAM:  VS: BP:120/78 mmHg  HR:77bpm  TEMP:98.3 F (36.8 C)(Oral)  RESP:100 %  HT:5\' 1"  (154.9 cm)   WT:150 lb 9.6 oz (68.312 kg)  BMI:28.5 PHYSICAL EXAM: General:  Alert and oriented, No acute distress.   HENT:  Normocephalic, Oral mucosa is moist. Eyes are equal and reactive to light, normal conjunctivae, normal hearing, mucous membrane is moist, no erythema, no exudate.  Bilateral ears have fluid with bulging but no erythema, TMs are intact.  Both nasal passages are inflamed, erythematous, with clear  drainage and inflamed turbinate.  Sinus passages are tender to palpation.  Submandibular glands are fluctuant mobile and soft. Respiratory:  Lungs are clear to auscultation, Respirations are non-labored, Symmetrical chest wall expansion.   Cardiovascular:  Normal rate, Regular rhythm, No murmur, Good pulses equal in all extremities, No edema.   Gastrointestinal:  Soft, Non-tender, Non-distended, Normal bowel sounds, No organomegaly.   Integumentary:  Warm, Dry, No rash.   Neurologic:  Alert, Oriented, No focal defects Psychiatric:  Cooperative, Appropriate mood & affect.    ASSESSMENT & PLAN:  Impression: Seasonal allergies causing allergic rhinitis  Recommendations: -Recommended treating with Flonase nasal spray. Also recommended Singulair given the patient has failed conservative management with over-the-counter allergy medications. Advised her to take these throughout allergy season both in the spring and fall. -Provided patient with handout of other over-the-counter agents to help with symptomatic relief. -Stressed the importance of nasal saline wash help clear nasal congestion. -Advised patient to follow-up when necessary Provided refills for Zocor and dry eye eyedrops.

## 2014-07-16 ENCOUNTER — Other Ambulatory Visit: Payer: Self-pay

## 2014-07-16 DIAGNOSIS — Z1231 Encounter for screening mammogram for malignant neoplasm of breast: Secondary | ICD-10-CM

## 2014-07-20 ENCOUNTER — Ambulatory Visit
Admission: RE | Admit: 2014-07-20 | Discharge: 2014-07-20 | Disposition: A | Discharge status: Home / Self Care | Payer: 59 | Source: Ambulatory Visit

## 2014-07-20 ENCOUNTER — Encounter (INDEPENDENT_AMBULATORY_CARE_PROVIDER_SITE_OTHER): Payer: Self-pay

## 2014-07-20 DIAGNOSIS — Z1231 Encounter for screening mammogram for malignant neoplasm of breast: Secondary | ICD-10-CM

## 2014-08-15 ENCOUNTER — Ambulatory Visit (INDEPENDENT_AMBULATORY_CARE_PROVIDER_SITE_OTHER): Payer: 59 | Admitting: Family Medicine

## 2014-08-15 VITALS — BP 100/62 | HR 75 | Temp 98.6°F | Resp 16 | Ht 61.0 in | Wt 151.0 lb

## 2014-08-15 DIAGNOSIS — H8110 Benign paroxysmal vertigo, unspecified ear: Secondary | ICD-10-CM

## 2014-08-15 LAB — COMPLETE METABOLIC PANEL WITH GFR
ALT: 11 U/L (ref 0–35)
AST: 15 U/L (ref 0–37)
Albumin: 4.1 g/dL (ref 3.5–5.2)
Alkaline Phosphatase: 56 U/L (ref 39–117)
BUN: 13 mg/dL (ref 6–23)
CO2: 28 mEq/L (ref 19–32)
Calcium: 9.4 mg/dL (ref 8.4–10.5)
Chloride: 100 mEq/L (ref 96–112)
Creat: 0.62 mg/dL (ref 0.50–1.10)
GFR, Est African American: >89 mL/min
GFR, Est Non African American: >89 mL/min
Glucose, Bld: 119 mg/dL — ABNORMAL HIGH (ref 70–99)
Potassium: 4 mEq/L (ref 3.5–5.3)
Sodium: 137 mEq/L (ref 135–145)
Total Bilirubin: 0.5 mg/dL (ref 0.2–1.2)
Total Protein: 6.9 g/dL (ref 6.0–8.3)

## 2014-08-15 LAB — POCT CBC
Granulocyte percent: 61.7 %G (ref 37–80)
HCT, POC: 38.4 % (ref 37.7–47.9)
Hemoglobin: 12.3 g/dL (ref 12.2–16.2)
Lymph, poc: 2 (ref 0.6–3.4)
MCH, POC: 30.5 pg (ref 27–31.2)
MCHC: 32.1 g/dL (ref 31.8–35.4)
MCV: 95 fL (ref 80–97)
MID (cbc): 0.5 (ref 0–0.9)
MPV: 7.8 fL (ref 0–99.8)
POC Granulocyte: 4 (ref 2–6.9)
POC LYMPH PERCENT: 30.9 %L (ref 10–50)
POC MID %: 7.4 %M (ref 0–12)
Platelet Count, POC: 216 10*3/uL (ref 142–424)
RBC: 4.04 M/uL (ref 4.04–5.48)
RDW, POC: 13 %
WBC: 6.5 10*3/uL (ref 4.6–10.2)

## 2014-08-15 MED ORDER — MECLIZINE HCL 12.5 MG PO TABS: 12.5000 mg | ORAL_TABLET | Freq: Three times a day (TID) | ORAL | Status: DC | PRN

## 2014-08-15 NOTE — Patient Instructions (Signed)
The vertigo should clear in the next 24-48 hours. We are running some tests to see if there is an underlying cause of this. Usually this is caused by either small crystals in the cochlea or a virus.

## 2014-08-15 NOTE — Progress Notes (Signed)
° °  Subjective:    Patient ID: Natalie Martinez, female    DOB: [birth year], 49 y.o.   MRN: [id number]  This chart was scribed for Robyn Haber, MD, by Stephania Fragmin, ED Scribe. This patient was seen in room 3 and the patient's care was started at 1:09 PM.   HPI  Chief Complaint  Patient presents with   Dizziness    Onset today this time   Blurred Vision   Nausea    onset today    HPI Comments: Natalie Martinez is a 49 y.o. female who presents to the [facility] complaining of sudden onset dizziness that began without provocation about 2 hours ago. She complains of associated nausea and blurred vision. Patient has had 2 similar prior episodes lasting for hours, which occurred about 1 month ago. She denies otalgia.   Patient works at [employer].   Review of Systems  HENT: Negative for ear pain.   Eyes: Positive for visual disturbance.  Gastrointestinal: Positive for nausea. Negative for vomiting.  Neurological: Positive for dizziness.       Objective:   Physical Exam  Constitutional: She is oriented to person, place, and time. She appears well-developed and well-nourished. No distress.  HENT:  Head: Normocephalic and atraumatic.  Eyes: Conjunctivae and EOM are normal.  Neck: Neck supple. No tracheal deviation present.  Cardiovascular: Normal rate.   Pulmonary/Chest: Effort normal. No respiratory distress.  Musculoskeletal: Normal range of motion.  Neurological: She is alert and oriented to person, place, and time.  Skin: Skin is warm and dry.  Psychiatric: She has a normal mood and affect. Her behavior is normal.  Nursing note and vitals reviewed.     Assessment & Plan:   This chart was scribed in my presence and reviewed by me personally.    ICD-9-CM ICD-10-CM   1. Benign paroxysmal positional vertigo, unspecified laterality 386.11 H81.10 POCT CBC     COMPLETE METABOLIC PANEL WITH GFR     meclizine (ANTIVERT) 12.5 MG tablet   This is not  typical for benign vertigo. Nevertheless I think this is a variant of an inner ear problem and without other hearing issues, I think it will respond to meclizine.  Signed, Robyn Haber, MD

## 2014-08-16 ENCOUNTER — Other Ambulatory Visit: Payer: Self-pay | Admitting: Family Medicine

## 2014-08-16 DIAGNOSIS — R42 Dizziness and giddiness: Secondary | ICD-10-CM

## 2014-08-28 ENCOUNTER — Ambulatory Visit (INDEPENDENT_AMBULATORY_CARE_PROVIDER_SITE_OTHER): Payer: 59 | Admitting: Neurology

## 2014-08-28 ENCOUNTER — Encounter: Payer: Self-pay | Admitting: Neurology

## 2014-08-28 VITALS — BP 103/62 | HR 68 | Ht 61.0 in | Wt 150.0 lb

## 2014-08-28 DIAGNOSIS — R42 Dizziness and giddiness: Secondary | ICD-10-CM | POA: Diagnosis not present

## 2014-08-28 NOTE — Progress Notes (Signed)
PATIENT: Natalie Martinez DOB: 03-12-1966  Chief Complaint  Patient presents with  . Dizziness    Reports having intermittent dizzy spells for three months.  Symptoms worsen when bending over.  She frequently has blurred vision.  Meclizine has provided some relief.    HISTORICAL  Natalie Martinez is a 49 year old right-handed Hispanic speaking female, accompanied by interpreter interpreter, referred by her primary care Dr. Joseph Art for evaluation of dizziness  She had a history of hyperlipidemia, also complains of depression anxiety, insomnia, but not on any treatment.  She had 3 episode of transient dizziness since March 2016, she worked at [employer], which required bending over, walking  The first episode early March 2016 at job, without clear triggers, she felt sudden onset lightheadedness in the standing position, lasting for couple minutes, no headache, no loss of consciousness, quickly improved by sitting down, she denied tinnitus, no hearing loss.  Second episode was a month later, she was sitting down taking a lunch break at her job, before she eats, she felt the room spinning, lightheadedness, lasting for couple minutes, quickly improved after she finished her lunch. She denied dysarthria, no gait difficulty.  Third episode was about 2 weeks ago, it was around 11:30, she was helping her coworker moving object, she felt sudden onset of dizziness, lightheadedness sensation, she has to sit down, again he improved quickly, she has mild blurry vision, no headaches,  She has been complaining of anxiety, depression, stress at home, insomnia.  REVIEW OF SYSTEMS: Full 14 system review of systems performed and notable only for weight gain, fatigue, chest pain, palpitation, spinning sensation, blurry vision, eye pain, shortness of breath, easy bruising, feeling hot, flushing, achy muscles, frequent infection, memory loss, confusion, headaches, slurred speech, dizziness, insomnia,  restless leg, depression, anxiety, not enough sleep, decreased energy, disinteresting activities, racing thoughts.  ALLERGIES: No Known Allergies  HOME MEDICATIONS: Current Outpatient Prescriptions  Medication Sig Dispense Refill  . azelastine (OPTIVAR) 0.05 % ophthalmic solution Place 1 drop into both eyes 2 (two) times daily. 6 mL 4  . fluticasone (FLONASE) 50 MCG/ACT nasal spray Place 2 sprays into both nostrils daily. 16 g 12  . meclizine (ANTIVERT) 12.5 MG tablet Take 1 tablet (12.5 mg total) by mouth 3 (three) times daily as needed for dizziness. 30 tablet 0  . montelukast (SINGULAIR) 10 MG tablet Take 1 tablet (10 mg total) by mouth at bedtime. 30 tablet 3  . Prenatal MV-Min-Fe Fum-FA-DHA (PRENATAL 1 PO) Take by mouth.    . simvastatin (ZOCOR) 20 MG tablet Take 1 tablet (20 mg total) by mouth every evening. 30 tablet 5     PAST MEDICAL HISTORY: Past Medical History  Diagnosis Date  . NSVD (normal spontaneous vaginal delivery)     X3  . Hypercholesterolemia   . Allergy   . Vertigo     PAST SURGICAL HISTORY: Past Surgical History  Procedure Laterality Date  . Cervical biopsy  w/ loop electrode excision  2004  &  2005    X 2  . Dilation and curettage of uterus  2000 & 2003  . Vaginal hysterectomy  2005    RECURRENT CERVICAL DYSPLASIA    FAMILY HISTORY: Family History  Problem Relation Age of Onset  . Diabetes Mother     SOCIAL HISTORY:  History   Social History  . Marital Status: Single    Spouse Name: N/A  . Number of Children: 3  . Years of Education: 15   Occupational History  .  Glass blower/designer    Social History Main Topics  . Smoking status: Never Smoker   . Smokeless tobacco: Never Used  . Alcohol Use: No  . Drug Use: No  . Sexual Activity: Yes    Birth Control/ Protection: None, Surgical   Other Topics Concern  . Not on file   Social History Narrative   Lives at home with her daughter and her mother.   Occasional caffeine use (coffee  twice weekly).   Right-handed.     PHYSICAL EXAM   Filed Vitals:   08/28/14 0808  BP: 103/62  Pulse: 68  Height: 5\' 1"  (1.549 m)  Weight: 150 lb (68.04 kg)    Not recorded      Body mass index is 28.36 kg/(m^2).  PHYSICAL EXAMNIATION:  Gen: NAD, conversant, well nourised, obese, well groomed                     Cardiovascular: Regular rate rhythm, no peripheral edema, warm, nontender. Eyes: Conjunctivae clear without exudates or hemorrhage Neck: Supple, no carotid bruise. Pulmonary: Clear to auscultation bilaterally   NEUROLOGICAL EXAM:  MENTAL STATUS: Speech:    Speech is normal; fluent and spontaneous with normal comprehension.  Cognition:    The patient is oriented to person, place, and time;     recent and remote memory intact;     language fluent;     normal attention, concentration,     fund of knowledge.  CRANIAL NERVES: CN II: Visual fields are full to confrontation. Fundoscopic exam is normal with sharp discs and no vascular changes. Venous pulsations are present bilaterally. Pupils are 4 mm and briskly reactive to light. Visual acuity is 20/20 bilaterally. CN III, IV, VI: extraocular movement are normal. No ptosis. CN V: Facial sensation is intact to pinprick in all 3 divisions bilaterally. Corneal responses are intact.  CN VII: Face is symmetric with normal eye closure and smile. CN VIII: Hearing is normal to rubbing fingers CN IX, X: Palate elevates symmetrically. Phonation is normal. CN XI: Head turning and shoulder shrug are intact CN XII: Tongue is midline with normal movements and no atrophy.  MOTOR: There is no pronator drift of out-stretched arms. Muscle bulk and tone are normal. Muscle strength is normal.  REFLEXES: Reflexes are 2+ and symmetric at the biceps, triceps, knees, and ankles. Plantar responses are flexor.  SENSORY: Light touch, pinprick, position sense, and vibration sense are intact in fingers and toes.  COORDINATION: Rapid  alternating movements and fine finger movements are intact. There is no dysmetria on finger-to-nose and heel-knee-shin. There are no abnormal or extraneous movements.   GAIT/STANCE: Posture is normal. Gait is steady with normal steps, base, arm swing, and turning. Heel and toe walking are normal. Tandem gait is normal.  Romberg is absent.   DIAGNOSTIC DATA (LABS, IMAGING, TESTING) - I reviewed patient records, labs, notes, testing and imaging myself where available.  Lab Results  Component Value Date   WBC 6.5 08/15/2014   HGB 12.3 08/15/2014   HCT 38.4 08/15/2014   MCV 95.0 08/15/2014   PLT 260 11/13/2013      Component Value Date/Time   NA 137 08/15/2014 1318   K 4.0 08/15/2014 1318   CL 100 08/15/2014 1318   CO2 28 08/15/2014 1318   GLUCOSE 119* 08/15/2014 1318   BUN 13 08/15/2014 1318   CREATININE 0.62 08/15/2014 1318   CREATININE 0.58 02/17/2011 0114   CALCIUM 9.4 08/15/2014 1318   PROT 6.9 08/15/2014  1318   ALBUMIN 4.1 08/15/2014 1318   AST 15 08/15/2014 1318   ALT 11 08/15/2014 1318   ALKPHOS 56 08/15/2014 1318   BILITOT 0.5 08/15/2014 1318   GFRNONAA >89 08/15/2014 1318   GFRNONAA >90 02/17/2011 0114   GFRAA >89 08/15/2014 1318   GFRAA >90 02/17/2011 0114   Lab Results  Component Value Date   CHOL 165 11/13/2013   HDL 47 11/13/2013   LDLCALC 96 11/13/2013   TRIG 110 11/13/2013   CHOLHDL 3.5 11/13/2013   No results found for: HGBA1C No results found for: VITAMINB12 Lab Results  Component Value Date   TSH 1.706 11/13/2013    ASSESSMENT AND PLAN  Natalie Martinez is a 49 y.o. female  with depression anxiety, presenting with intermittent episode of transient dizziness, normal neurological examination,  1, history of less suggestive of brainstem/cerebellum etiology because of transient features 2, differentiation diagnosis includes presyncope, orthostatic blood pressure changes, 3, continue observe her symptoms, document all event, she might also benefit  treatment for her depression anxiety insomnia. 4. Return to clinic in 2 months.  Marcial Pacas, M.D. Ph.D.  [facility] [address], [place], [place] [address] Ph: [phone] Fax: [phone]

## 2014-10-30 ENCOUNTER — Ambulatory Visit: Payer: 59 | Admitting: Nurse Practitioner

## 2014-12-20 ENCOUNTER — Other Ambulatory Visit: Payer: Self-pay

## 2014-12-20 DIAGNOSIS — E785 Hyperlipidemia, unspecified: Secondary | ICD-10-CM

## 2014-12-20 MED ORDER — SIMVASTATIN 20 MG PO TABS: 20.0000 mg | ORAL_TABLET | Freq: Every evening | ORAL | Status: DC

## 2015-01-31 ENCOUNTER — Other Ambulatory Visit (HOSPITAL_COMMUNITY)
Admission: RE | Admit: 2015-01-31 | Discharge: 2015-01-31 | Disposition: A | Discharge status: Home / Self Care | Payer: Managed Care, Other (non HMO) | Source: Ambulatory Visit | Attending: Gynecology | Admitting: Gynecology

## 2015-01-31 ENCOUNTER — Encounter: Payer: Self-pay | Admitting: Gynecology

## 2015-01-31 ENCOUNTER — Ambulatory Visit (INDEPENDENT_AMBULATORY_CARE_PROVIDER_SITE_OTHER): Payer: Managed Care, Other (non HMO) | Admitting: Gynecology

## 2015-01-31 VITALS — BP 118/70 | Ht 60.5 in | Wt 153.6 lb

## 2015-01-31 DIAGNOSIS — Z01419 Encounter for gynecological examination (general) (routine) without abnormal findings: Secondary | ICD-10-CM

## 2015-01-31 DIAGNOSIS — Z1151 Encounter for screening for human papillomavirus (HPV): Secondary | ICD-10-CM | POA: Insufficient documentation

## 2015-01-31 DIAGNOSIS — Z23 Encounter for immunization: Secondary | ICD-10-CM

## 2015-01-31 DIAGNOSIS — E785 Hyperlipidemia, unspecified: Secondary | ICD-10-CM | POA: Diagnosis not present

## 2015-01-31 DIAGNOSIS — Z87411 Personal history of vaginal dysplasia: Secondary | ICD-10-CM | POA: Diagnosis not present

## 2015-01-31 DIAGNOSIS — Z87410 Personal history of cervical dysplasia: Secondary | ICD-10-CM | POA: Diagnosis not present

## 2015-01-31 MED ORDER — SIMVASTATIN 20 MG PO TABS: 20.0000 mg | ORAL_TABLET | Freq: Every evening | ORAL | Status: DC

## 2015-02-01 ENCOUNTER — Other Ambulatory Visit: Payer: Managed Care, Other (non HMO)

## 2015-02-01 LAB — CBC WITH DIFFERENTIAL/PLATELET
BASOS ABS: 0 10*3/uL (ref 0.0–0.1)
Basophils Relative: 0 % (ref 0–1)
Eosinophils Absolute: 0.1 10*3/uL (ref 0.0–0.7)
Eosinophils Relative: 2 % (ref 0–5)
HEMATOCRIT: 41.6 % (ref 36.0–46.0)
Hemoglobin: 13.9 g/dL (ref 12.0–15.0)
LYMPHS ABS: 1.2 10*3/uL (ref 0.7–4.0)
Lymphocytes Relative: 16 % (ref 12–46)
MCH: 32 pg (ref 26.0–34.0)
MCHC: 33.4 g/dL (ref 30.0–36.0)
MCV: 95.6 fL (ref 78.0–100.0)
MONOS PCT: 5 % (ref 3–12)
MPV: 10.7 fL (ref 8.6–12.4)
Monocytes Absolute: 0.4 10*3/uL (ref 0.1–1.0)
NEUTROS PCT: 77 % (ref 43–77)
Neutro Abs: 5.6 10*3/uL (ref 1.7–7.7)
Platelets: 257 10*3/uL (ref 150–400)
RBC: 4.35 MIL/uL (ref 3.87–5.11)
RDW: 12.8 % (ref 11.5–15.5)
WBC: 7.3 10*3/uL (ref 4.0–10.5)

## 2015-02-01 LAB — LIPID PANEL
CHOL/HDL RATIO: 4.1 ratio (ref <=5.0)
CHOLESTEROL: 177 mg/dL (ref 125–200)
HDL: 43 mg/dL — ABNORMAL LOW (ref >=46)
LDL Cholesterol: 106 mg/dL (ref <130)
TRIGLYCERIDES: 138 mg/dL (ref <150)
VLDL: 28 mg/dL (ref <30)

## 2015-02-01 LAB — URINALYSIS W MICROSCOPIC + REFLEX CULTURE
Bacteria, UA: NONE SEEN [HPF]
Bilirubin Urine: NEGATIVE
CASTS: NONE SEEN [LPF]
CRYSTALS: NONE SEEN [HPF]
Glucose, UA: NEGATIVE
KETONES UR: NEGATIVE
Leukocytes, UA: NEGATIVE
NITRITE: NEGATIVE
PH: 5.5 (ref 5.0–8.0)
PROTEIN: NEGATIVE
Specific Gravity, Urine: 1.025 (ref 1.001–1.035)
WBC, UA: NONE SEEN WBC/HPF (ref <=5)
YEAST: NONE SEEN [HPF]

## 2015-02-01 LAB — COMPREHENSIVE METABOLIC PANEL
ALK PHOS: 56 U/L (ref 33–115)
ALT: 11 U/L (ref 6–29)
AST: 13 U/L (ref 10–35)
Albumin: 4.1 g/dL (ref 3.6–5.1)
BUN: 11 mg/dL (ref 7–25)
CALCIUM: 9.4 mg/dL (ref 8.6–10.2)
CO2: 25 mmol/L (ref 20–31)
Chloride: 103 mmol/L (ref 98–110)
Creat: 0.63 mg/dL (ref 0.50–1.10)
Glucose, Bld: 83 mg/dL (ref 65–99)
POTASSIUM: 4.3 mmol/L (ref 3.5–5.3)
Sodium: 139 mmol/L (ref 135–146)
TOTAL PROTEIN: 6.7 g/dL (ref 6.1–8.1)
Total Bilirubin: 0.6 mg/dL (ref 0.2–1.2)

## 2015-02-01 LAB — TSH: TSH: 1.695 u[IU]/mL (ref 0.350–4.500)
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
## 2015-02-02 LAB — URINE CULTURE
COLONY COUNT: NO GROWTH
Organism ID, Bacteria: NO GROWTH

## 2015-02-04 ENCOUNTER — Other Ambulatory Visit: Payer: Self-pay | Admitting: *Deleted

## 2015-02-04 DIAGNOSIS — R319 Hematuria, unspecified: Secondary | ICD-10-CM

## 2015-02-04 LAB — CYTOLOGY - PAP

## 2015-02-08 ENCOUNTER — Other Ambulatory Visit: Payer: Managed Care, Other (non HMO)

## 2015-02-19 ENCOUNTER — Other Ambulatory Visit: Payer: Managed Care, Other (non HMO)

## 2015-02-19 DIAGNOSIS — R319 Hematuria, unspecified: Secondary | ICD-10-CM

## 2015-02-20 LAB — URINALYSIS W MICROSCOPIC + REFLEX CULTURE
Bilirubin Urine: NEGATIVE
CASTS: NONE SEEN [LPF]
CRYSTALS: NONE SEEN [HPF]
GLUCOSE, UA: NEGATIVE
Ketones, ur: NEGATIVE
Leukocytes, UA: NEGATIVE
Nitrite: NEGATIVE
PROTEIN: NEGATIVE
RBC / HPF: NONE SEEN RBC/HPF (ref <=2)
Specific Gravity, Urine: 1.02 (ref 1.001–1.035)
YEAST: NONE SEEN [HPF]
pH: 6 (ref 5.0–8.0)

## 2015-02-21 LAB — URINE CULTURE
Colony Count: NO GROWTH
ORGANISM ID, BACTERIA: NO GROWTH

## 2015-05-13 ENCOUNTER — Encounter: Attending: Obstetrics & Gynecology | Primary: Internal Medicine

## 2015-05-29 ENCOUNTER — Ambulatory Visit (INDEPENDENT_AMBULATORY_CARE_PROVIDER_SITE_OTHER): Payer: Managed Care, Other (non HMO)

## 2015-05-29 ENCOUNTER — Ambulatory Visit (INDEPENDENT_AMBULATORY_CARE_PROVIDER_SITE_OTHER): Payer: Managed Care, Other (non HMO) | Admitting: Urgent Care

## 2015-05-29 VITALS — BP 124/80 | HR 72 | Temp 98.1°F | Resp 16 | Ht 61.5 in | Wt 150.0 lb

## 2015-05-29 DIAGNOSIS — M5134 Other intervertebral disc degeneration, thoracic region: Secondary | ICD-10-CM

## 2015-05-29 DIAGNOSIS — M461 Sacroiliitis, not elsewhere classified: Secondary | ICD-10-CM

## 2015-05-29 DIAGNOSIS — M25551 Pain in right hip: Secondary | ICD-10-CM

## 2015-05-29 DIAGNOSIS — M545 Low back pain, unspecified: Secondary | ICD-10-CM

## 2015-05-29 DIAGNOSIS — Z8719 Personal history of other diseases of the digestive system: Secondary | ICD-10-CM

## 2015-05-29 MED ORDER — MELOXICAM 7.5 MG PO TABS: 7.5000 mg | ORAL_TABLET | Freq: Every day | ORAL | Status: DC

## 2015-05-29 MED ORDER — CYCLOBENZAPRINE HCL 5 MG PO TABS: 5.0000 mg | ORAL_TABLET | Freq: Three times a day (TID) | ORAL | Status: DC | PRN

## 2015-05-29 NOTE — Progress Notes (Signed)
MRN: [id number] DOB: 07-05-1965  Subjective:   Natalie Martinez is a 50 y.o. female presenting for chief complaint of Back Pain  Reports ~1 year history of right low back and hip pain. Pain occurs every day, is very sharp, last less than 10 seconds. 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 Requires a lot of bending and light lifting which elicits her pain. Also has difficulty sleeping at night unless she lays on her back. Has tried Advil with some relief. She does not hydrate well, drinks about 24 ounces of water daily. Also admits a history of constipation, currently managed with otc remedies. Denies fever, n/v, abdominal pain, pelvic pain, dysuria, hematuria, radiation of her back pain, numbness and tingling, weakness.   Natalie Martinez has a current medication list which includes the following prescription(s): simvastatin. Also has No Known Allergies.  Natalie Martinez  has a past medical history of NSVD (normal spontaneous vaginal delivery); Hypercholesterolemia; Allergy; and Vertigo. Also  has past surgical history that includes Cervical biopsy w/ loop electrode excision (2004  &  2005); Dilation and curettage of uterus (2000 & 2003); and Vaginal hysterectomy (2005).  Objective:   Vitals: BP 124/80 mmHg  Pulse 72  Temp(Src) 98.1 F (36.7 C)  Resp 16  Ht 5' 1.5" (1.562 m)  Wt 150 lb (68.04 kg)  BMI 27.89 kg/m2  SpO2 99%  Physical Exam  Constitutional: She is oriented to person, place, and time. She appears well-developed and well-nourished.  Cardiovascular: Normal rate.   Pulmonary/Chest: Effort normal.  Musculoskeletal:       Lumbar back: She exhibits tenderness (over area depicted), bony tenderness (superior iliac crest as well) and spasm (over lumbar paraspinal muscles bilaterally). She exhibits normal range of motion, no swelling, no edema, no deformity and no laceration.       Back:  Neurological: She is alert and oriented to person, place, and time. She has normal reflexes.    Skin: Skin is warm and dry.   Dg Lumbar Spine Complete  05/29/2015  CLINICAL DATA:  Right hip pain. Right low back pain without sciatica. EXAM: LUMBAR SPINE - COMPLETE 4+ VIEW COMPARISON:  Radiographs 12/29/2013 FINDINGS: The alignment is maintained. Vertebral body heights are normal. There is no listhesis. The posterior elements are intact. Lumbar disc spaces are preserved, there is disc space narrowing and endplate spurring in the included lower thoracic spine. Minimal facet hypertrophy at L4-L5 and L5-S1. No fracture. Sacroiliac joints are symmetric the sclerosis about both sacroiliac joints, left greater than right. IMPRESSION: 1. No acute bony abnormality of the lumbar spine. 2. Sclerosis about both sacroiliac joints, left greater than right. This may reflect sequela of sacroiliitis. 3. Mild facet hypertrophy at L4-L5 and L5-S1. Degenerative disc disease in the lower thoracic spine. Electronically Signed   By: Jeb Levering M.D.   On: 05/29/2015 18:45   Dg Hip Unilat W Or W/o Pelvis 2-3 Views Right  05/29/2015  CLINICAL DATA:  Right hip pain. Right low back pain without sciatica. EXAM: DG HIP (WITH OR WITHOUT PELVIS) 2-3V RIGHT COMPARISON:  No prior hip imaging, lumbar spine radiographs 12/29/2013. FINDINGS: The cortical margins of the bony pelvis are intact. No fracture. Pubic symphysis and sacroiliac joints are congruent. There is sclerosis about both sacroiliac joints, left greater than right. Both femoral heads are well-seated in the respective acetabula. IMPRESSION: 1. No acute bony abnormality of the pelvis or right hip. 2. Sclerosis about both sacroiliac joints, unchanged from prior lumbar spine radiographs. This may reflect sequela  of sacroiliitis. Electronically Signed   By: Jeb Levering M.D.   On: 05/29/2015 18:43   Assessment and Plan :   1. Bilateral sacroiliitis ([place]) 2. Right-sided low back pain without sciatica 3. Degenerative disc disease, thoracic - Counseled patient on  diagnosis. She admits a strong family history of arthritis. We will start conservative management with Flexeril, Meloxicam, back stretches, reviewed back care. Patient is to rtc in 2 weeks. Consider steroid course, referral to PT or ortho. Patient verbalized understanding.  4. History of constipation - Counseled on continued management, patient verbalized understanding.   Jaynee Eagles, PA-C [facility] [phone] 05/29/2015 6:09 PM

## 2015-05-29 NOTE — Patient Instructions (Addendum)
Because you received an x-ray today, you will receive an invoice from [facility]. Please contact [facility] at [phone] with questions or concerns regarding your invoice. Our billing staff will not be able to assist you with those questions.    Cyclobenzaprine tablets Qu es este medicamento? La CICLOBENZAPRINA es un relajante muscular. Se utiliza para tratar [employer] y la rigidez de los msculos y espasmos musculares. Este medicamento puede ser utilizado para otros usos; si tiene alguna pregunta consulte con su proveedor de atencin mdica o con su farmacutico. Qu le debo informar a mi profesional de la salud antes de tomar este medicamento? Necesita saber si usted presenta alguno de los siguientes problemas o situaciones: -enfermedad cardiaca, pulso cardiaco irregular o ataque cardiaco previo -enfermedad heptica -problemas tiroideos -una reaccin alrgica o inusual a la ciclobenzaprina, antidepresivos tricclicos, lactosa, a otros medicamentos, alimentos, colorantes o conservadores -si est embarazada o buscando quedar embarazada -si est amamantando a un beb Cmo debo utilizar este medicamento? Tome este medicamento por va oral con un vaso de agua. Siga las instrucciones de la etiqueta del [place]. Si este Transport planner, tmelo con alimentos o con [employer]. No tome su medicamento con una frecuencia mayor a la indicada. Hable con su pediatra para informarse acerca del uso de este medicamento en nios. Puede requerir atencin especial. Sobredosis: Pngase en contacto inmediatamente con un centro toxicolgico o una sala de urgencia si usted cree que haya tomado demasiado medicamento. ATENCIN: [employer] es solo para usted. No comparta este medicamento con nadie. Qu sucede si me olvido de una dosis? Si olvida una dosis, tmela lo antes posible. Si es casi la hora de su dosis siguiente, tome slo esa dosis. No tome dosis  adicionales o dobles. Qu puede interactuar con este medicamento? No tome esta medicina con ninguno de los siguientes medicamentos: -ciertos medicamentos para infecciones micticas, tales como fluconazol, quetoconazol, itraconazol, posaconazol, voriconazol -cisapride -dofetilida -dronedarona -droperidol -flecainida -grepafloxacino -halofantrina -levometadilo -IMAOs tales como Carbex, Eldepryl, Marplan, Nardil y [place]arn[place] -nilotinib -pimozida -probucol -sertindol -tioridazina -ziprasidona Esta medicina tambin puede interactuar con los siguientes medicamentos: -abarlix -alcohol -ciertos medicamentos para el cncer -ciertos medicamentos para la depresin, ansiedad o trastornos psicticos -ciertos medicamentos para infecciones, tales como alfuzosn, cloroquina, claritromicina, levofloxacino, mefloquina, pentamidina, troleandomicina -ciertos medicamentos para el pulso cardiaco irregular -ciertos medicamentos usados para hacerle dormir o para entumecer durante una ciruga o procedimiento -colorantes de Programmer, multimedia -dolasetrn -guanetidina -metadona -octreotida -ondansetrn -otros medicamentos que prolongan el intervalo QT (causa un ritmo cardiaco anormal) -palonosetrn -fenotiazinas, tales como clorpromacina, mesoridazina, proclorperazina, tioridazina -tramadol -vardenafil Puede ser que esta lista no menciona todas las posibles interacciones. Informe a su profesional de [employer]	[place] de [employer] productos a base de hierbas, medicamentos de [place] o suplementos nutritivos que est tomando. Si usted fuma, consume bebidas alcohlicas o si utiliza drogas ilegales, indqueselo tambin a su profesional de [employer]	[place]. Algunas sustancias pueden interactuar con su medicamento. A qu debo estar atento al usar [employer]? Consulte a su mdico o a su profesional de la salud si su problema no mejora en 1 a 3 semanas. Puede experimentar somnolencia o mareos al comenzar con el medicamento o  al cambiar de dosis. No conduzca ni utilice maquinaria, ni haga nada que sea peligroso hasta que sepa cmo le afecta este medicamento. Sintese y pngase de pie lentamente. Se le podr secar la boca. Masticar chicle sin azcar, chupar caramelos duros y beber agua le ayudarn a Theatre manager la boca hmeda. Qu efectos secundarios  puedo tener al [employer] medicamento? Efectos secundarios que debe informar a su mdico o a Barrister's clerk de la salud tan pronto como sea posible: -Chief of Staff como erupcin cutnea, picazn o urticarias, hinchazn de la cara, labios o lengua -dolor en el pecho -pulso cardiaco rpido -alucinaciones -convulsiones -vmito Efectos secundarios que, por lo general, no requieren atencin mdica (debe informarlos a su mdico o a su profesional de la salud si persisten o si son molestos): -dolor de cabeza Puede ser que esta lista no menciona todos los posibles efectos secundarios. Comunquese a su mdico por asesoramiento mdico [employer]. Usted puede informar los efectos secundarios a la FDA por telfono al 1-800-FDA-1088. Dnde debo guardar mi medicina? Mantngala fuera del alcance de los nios. Gurdela a [employer], entre 15 y 77 grados C (7 y 90 grados F). Mantenga el envase bien cerrado. Deseche todo el medicamento que no haya utilizado, despus de la fecha de vencimiento. ATENCIN: Este folleto es un resumen. Puede ser que no cubra toda la posible informacin. Si usted tiene preguntas acerca de esta medicina, consulte con su mdico, su farmacutico o su profesional de Technical sales engineer.    2016, Elsevier/Gold Standard. (2014-05-01 00:00:00)    Meloxicam tablets Qu es este medicamento? El MELOXICAM es un medicamento antiinflamatorio no esteroideo (AINE). Se utiliza para reducir la inflamacin y Regulatory affairs officer. Este medicamento se puede utilizar tambin para la osteoartritis, la artritis reumatoidea o la artritis reumatoidea  juvenil. Este medicamento puede ser utilizado para otros usos; si tiene alguna pregunta consulte con su proveedor de atencin mdica o con su farmacutico. Qu le debo informar a mi profesional de la salud antes de tomar este medicamento? Necesitan saber si usted presenta alguno de los siguientes problemas o situaciones: trastornos de sangrado fuma ciruga de bypass coronario con injerto en las ltimas 2 semanas consume ms de 3 bebidas alcohlicas por da enfermedad cardiaca alta presin sangunea antecedentes de sangrado estomacal enfermedad renal enfermedad heptica enfermedad pulmonar o respiratoria, como asma problemas estomacales o intestinales una reaccin alrgica o inusual al meloxicam, a la aspirina, a otros AINE, otros medicamentos, alimentos, colorantes o conservantes est embarazada o buscando quedar embarazada est amamantando a un beb Cmo debo Insurance account manager medicamento? Tome este medicamento por va oral con un vaso lleno de agua. Siga las instrucciones de la etiqueta del [place]. Puede tomarlo con o sin alimentos. Si el Transport planner, tmelo con alimentos. Tome sus dosis a intervalos regulares. No lo tome con una frecuencia mayor a la indicada. No deje de tomarlo excepto si as lo indica su mdico. Su farmacutico le dar una Gua del medicamento especial (MedGuide, su nombre en ingls) con cada receta y relleno. Asegrese de leer esta informacin cada vez cuidadosamente. Hable con su pediatra para informarse acerca del uso de este medicamento en nios. Aunque este medicamento se puede recetar a nios tan pequeos como de 2 aos de edad para condiciones selectivas, existen precauciones que deben cumplirse. Los pacientes de ms de 65 aos de edad pueden presentar reacciones ms fuertes y Designer, industrial/product dosis [employer] Sobredosis: Pngase en contacto inmediatamente con un centro toxicolgico o una sala de urgencia si usted cree que haya tomado demasiado  medicamento. ATENCIN: [employer] es solo para usted. No comparta este medicamento con nadie. Qu sucede si me olvido de una dosis? Si olvida una dosis, tmela lo antes posible. Si es casi la hora de su dosis siguiente, tome slo esa dosis. No tome dosis adicionales o  dobles. Qu puede interactuar con este medicamento? No tome esta medicina con ninguno de los siguientes medicamentos: cidofovir ketorolac Esta medicina tambin puede interactuar con los siguientes medicamentos: aspirina y medicamentos tipo aspirina ciertos medicamentos para la presin sangunea, enfermedad cardiaca, pulso cardiaco irregular ciertos medicamentos para la depresin, ansiedad o trastornos psicticos ciertos medicamentos que tratan o previenen cogulos sanguneos, como warfarina, enoxaparina, dalteparina, apixaban, dabigatran, rivaroxaban ciclosporina digoxina diurticos metotrexato otros AINE, medicamentos para el dolor e inflamacin, como ibuprofeno y naproxeno pemetrexed Puede ser que esta lista no menciona todas las posibles interacciones. Informe a su profesional de [employer]	[place] de [employer] productos a base de hierbas, medicamentos de [place] o suplementos nutritivos que est tomando. Si usted fuma, consume bebidas alcohlicas o si utiliza drogas ilegales, indqueselo tambin a su profesional de [employer]	[place]. Algunas sustancias pueden interactuar con su medicamento. A qu debo estar atento al usar Coca[employer]Cola? Informe a su mdico o su profesional de la salud si sus sntomas no comienzan a mejorar o si empeoran. Evite tomar otros medicamentos que contienen aspirina, ibuprofeno o naproxeno con Coca[employer]Cola. Es ms probable que se produzcan efectos secundarios, tales como molestias estomacales, nuseas o lceras. No debe de tomar Coca-Cola con muchos medicamentos disponibles de [employer]. Este medicamento puede causar lceras y sangrado en el estmago e intestinos en cualquier momento durante el  tratamiento. Esto puede suceder sin aviso y puede causar la muerte. El riesgo es mayor cuando se toma este medicamento durante [place]. Fumar, beber alcohol, ser de edad avanzada y [place] salud tambin pueden aumentar los riesgos. Llame a su mdico de inmediato si tiene dolor de estmago o sangre en su vmito o heces. Este medicamento no previene ataques al corazn o derrames cerebrales. De hecho, este medicamento puede aumentar la posibilidad de Insurance risk surveyor un ataque cardiaco o un derrame cerebral. La posibilidad puede aumentar con el uso prolongado de este medicamento y en pacientes con enfermedad cardiaca. Si est tomando aspirina para la prevencin de ataques cardiacos o derrames cerebrales, hable con su mdico o su profesional de [employer]	[place]. Qu efectos secundarios puedo tener al [employer] este medicamento? Efectos secundarios que debe informar a su mdico o a Barrister's clerk de la salud tan pronto como sea posible: Chief of Staff, como erupcin cutnea, picazn o urticarias, hinchazn de la cara, los labios o la lengua nuseas, vmito signos y sntomas de un cogulo sanguneo tales como problemas respiratorios; cambios en la visin; dolor en el pecho; dolor de cabeza severo, repentino; dolor, hinchazn, calor en la pierna; dificultad para hablar; entumecimiento o debilidad repentina de la cara, el brazo o la pierna signos y sntomas de sangrado tales como heces con sangre o de color oscuro y Curator alquitranado; orina de color rojo o Forensic psychologist oscuro; si escupe sangre o material marrn que tiene el aspecto de granos de caf molido; Tree surgeon rojas en la piel; sangrado o magulladuras inusuales de los ojos, las encas o la nariz signos y sntomas de lesin al hgado, como orina amarillo oscuro o Forensic psychologist; sensacin general de estar enfermo o sntomas gripales; heces claras; prdida de apetito; nuseas; dolor en la regin abdominal superior derecha; cansancio o debilidad inusual; color amarillento de los ojos  o la piel signos y sntomas de un derrame cerebral, tales como cambios en la visin; confusin; dificultad para hablar o entender; dolores de cabeza severos; entumecimiento o debilidad repentina de la cara, el brazo o la pierna; dificultad para andar; Tree surgeon; prdida del equilibrio o la coordinacin  Efectos secundarios que generalmente no requieren atencin mdica (debe informarlos a su mdico o a Barrister's clerk de la salud si persisten o si son molestos): estreimiento diarrea gases Puede ser que esta lista no menciona todos los posibles efectos secundarios. Comunquese a su mdico por asesoramiento mdico [employer]. Usted puede informar los efectos secundarios a la FDA por telfono al 1-800-FDA-1088. Dnde debo guardar mi medicina? Mantngala fuera del alcance de los nios. Gurdela a [employer], entre 15 y 48 grados C (9 y 71 grados F). Deseche todo el medicamento que no haya utilizado, despus de la fecha de vencimiento. ATENCIN: Este folleto es un resumen. Puede ser que no cubra toda la posible informacin. Si usted tiene preguntas acerca de esta medicina, consulte con su mdico, su farmacutico o su profesional de Technical sales engineer.    2016, Elsevier/Gold Standard. (2014-07-20 00:00:00)

## 2015-06-13 ENCOUNTER — Ambulatory Visit
Admit: 2015-06-13 | Discharge: 2015-06-13 | Payer: PRIVATE HEALTH INSURANCE | Attending: Obstetrics & Gynecology | Primary: Internal Medicine

## 2015-06-13 DIAGNOSIS — Z01419 Encounter for gynecological examination (general) (routine) without abnormal findings: Secondary | ICD-10-CM

## 2015-06-13 NOTE — Patient Instructions (Signed)
Well Visit, Ages 18 to 50: Care Instructions  Your Care Instructions  Physical exams can help you stay healthy. Your doctor has checked your overall health and may have suggested ways to take good care of yourself. He or she also may have recommended tests. At home, you can help prevent illness with healthy eating, regular exercise, and other steps.  Follow-up care is a key part of your treatment and safety. Be sure to make and go to all appointments, and call your doctor if you are having problems. It's also a good idea to know your test results and keep a list of the medicines you take.  How can you care for yourself at home?  ?? Reach and stay at a healthy weight. This will lower your risk for many problems, such as obesity, diabetes, heart disease, and high blood pressure.  ?? Get at least 30 minutes of physical activity on most days of the week. Walking is a good choice. You also may want to do other activities, such as running, swimming, cycling, or playing tennis or team sports. Discuss any changes in your exercise program with your doctor.  ?? Do not smoke or allow others to smoke around you. If you need help quitting, talk to your doctor about stop-smoking programs and medicines. These can increase your chances of quitting for good.  ?? Talk to your doctor about whether you have any risk factors for sexually transmitted infections (STIs). Having one sex partner (who does not have STIs and does not have sex with anyone else) is a good way to avoid these infections.  ?? Use birth control if you do not want to have children at this time. Talk with your doctor about the choices available and what might be best for you.  ?? Protect your skin from too much sun. When you're outdoors from 10 a.m. to 4 p.m., stay in the shade or cover up with clothing and a hat with a wide brim. Wear sunglasses that block UV rays. Even when it's cloudy, put broad-spectrum sunscreen (SPF 30 or higher) on any exposed skin.   ?? See a dentist one or two times a year for checkups and to have your teeth cleaned.  ?? Wear a seat belt in the car.  ?? Drink alcohol in moderation, if at all. That means no more than 2 drinks a day for men and 1 drink a day for women.  Follow your doctor's advice about when to have certain tests. These tests can spot problems early.  For everyone  ?? Cholesterol. Have the fat (cholesterol) in your blood tested after age 20. Your doctor will tell you how often to have this done based on your age, family history, or other things that can increase your risk for heart disease.  ?? Blood pressure. Have your blood pressure checked during a routine doctor visit. Your doctor will tell you how often to check your blood pressure based on your age, your blood pressure results, and other factors.  ?? Vision. Talk with your doctor about how often to have a glaucoma test.  ?? Diabetes. Ask your doctor whether you should have tests for diabetes.  ?? Colon cancer. Have a test for colon cancer at age 50. You may have one of several tests. If you are younger than 50, you may need a test earlier if you have any risk factors. Risk factors include whether you already had a precancerous polyp removed from your colon or whether your parent, brother,   sister, or child has had colon cancer.  For women  ?? Breast exam and mammogram. Talk to your doctor about when you should have a clinical breast exam and a mammogram. Medical experts differ on whether and how often women under 50 should have these tests. Your doctor can help you decide what is right for you.  ?? Pap test and pelvic exam. Begin Pap tests at age 21. A Pap test is the best way to find cervical cancer. The test often is part of a pelvic exam. Ask how often to have this test.  ?? Tests for sexually transmitted infections (STIs). Ask whether you should have tests for STIs. You may be at risk if you have sex with more than one person, especially if your partners do not wear condoms.   For men  ?? Tests for sexually transmitted infections (STIs). Ask whether you should have tests for STIs. You may be at risk if you have sex with more than one person, especially if you do not wear a condom.  ?? Testicular cancer exam. Ask your doctor whether you should check your testicles regularly.  ?? Prostate exam. Talk to your doctor about whether you should have a blood test (called a PSA test) for prostate cancer. Experts differ on whether and when men should have this test. Some experts suggest it if you are older than 45 and are African-American or have a father or brother who got prostate cancer when he was younger than 65.  When should you call for help?  Watch closely for changes in your health, and be sure to contact your doctor if you have any problems or symptoms that concern you.  Where can you learn more?  Go to http://www.healthwise.net/GoodHelpConnections.  Enter P072 in the search box to learn more about "Well Visit, Ages 18 to 50: Care Instructions."  Current as of: October 09, 2014  Content Version: 11.1  ?? 2006-2016 Healthwise, Incorporated. Care instructions adapted under license by [employer] Help [employer] (which disclaims liability or warranty for this information). If you have questions about a medical condition or this instruction, always ask your healthcare professional. Healthwise, Incorporated disclaims any warranty or liability for your use of this information.

## 2015-06-13 NOTE — Progress Notes (Signed)
Subjective:   50 y.o. female for Well Woman Check.  No LMP recorded. Patient has had a hysterectomy.      Social History: single partner, contraception - none and status post hysterectomy.  Pertinent past medical hstory: no history of HTN, DVT, CAD, DM, liver disease, migraines or smoking.         ROS:  Feeling well. No dyspnea or chest pain on exertion.  No abdominal pain, change in bowel habits, black or bloody stools.  No urinary tract symptoms. GYN ROS: no breast pain or new or enlarging lumps on self exam, she complains of abnormal vaginal bleeding. No neurological complaints.    Objective:     Visit Vitals   ??? BP 130/83 (BP 1 Location: Left arm, BP Patient Position: Sitting)   ??? Pulse 91   ??? Ht 5\' 4"  (1.626 m)   ??? Wt 278 lb (126.1 kg)   ??? BMI 47.72 kg/m2     The patient appears well, alert, oriented x 3, in no distress.  ENT normal.  Neck supple. No adenopathy or thyromegaly. PERLA. Lungs are clear, good air entry, no wheezes, rhonchi or rales. S1 and S2 normal, no murmurs, regular rate and rhythm. Abdomen soft without tenderness, guarding, mass or organomegaly. Extremities show no edema, normal peripheral pulses. Neurological is normal, no focal findings.    BREAST EXAM: breasts appear normal, no suspicious masses, no skin or nipple changes or axillary nodes    PELVIC EXAM: VULVA: normal appearing vulva with no masses, tenderness or lesions, VAGINA: normal appearing vagina with normal color and discharge, no lesions, CERVIX: normal appearing cervix without discharge or lesions, ADNEXA: normal adnexa in size, nontender and no masses    Assessment/Plan:   well woman  mammogram  pap smear  return annually or prn  .

## 2015-06-14 ENCOUNTER — Inpatient Hospital Stay: Admit: 2015-06-14 | Payer: PRIVATE HEALTH INSURANCE | Primary: Internal Medicine

## 2015-06-18 ENCOUNTER — Inpatient Hospital Stay: Admit: 2015-06-18 | Payer: PRIVATE HEALTH INSURANCE | Attending: Obstetrics & Gynecology | Primary: Internal Medicine

## 2015-06-18 ENCOUNTER — Encounter

## 2015-06-18 DIAGNOSIS — Z1231 Encounter for screening mammogram for malignant neoplasm of breast: Secondary | ICD-10-CM

## 2015-07-26 ENCOUNTER — Encounter: Attending: Sports Medicine | Primary: Internal Medicine

## 2015-07-31 ENCOUNTER — Encounter: Attending: Sports Medicine | Primary: Internal Medicine

## 2015-10-07 ENCOUNTER — Ambulatory Visit (INDEPENDENT_AMBULATORY_CARE_PROVIDER_SITE_OTHER): Payer: Managed Care, Other (non HMO) | Admitting: Gynecology

## 2015-10-07 ENCOUNTER — Encounter: Payer: Self-pay | Admitting: Gynecology

## 2015-10-07 VITALS — BP 120/82

## 2015-10-07 DIAGNOSIS — L298 Other pruritus: Secondary | ICD-10-CM | POA: Diagnosis not present

## 2015-10-07 DIAGNOSIS — B9689 Other specified bacterial agents as the cause of diseases classified elsewhere: Secondary | ICD-10-CM

## 2015-10-07 DIAGNOSIS — Z113 Encounter for screening for infections with a predominantly sexual mode of transmission: Secondary | ICD-10-CM | POA: Diagnosis not present

## 2015-10-07 DIAGNOSIS — N898 Other specified noninflammatory disorders of vagina: Secondary | ICD-10-CM

## 2015-10-07 DIAGNOSIS — N760 Acute vaginitis: Secondary | ICD-10-CM

## 2015-10-07 DIAGNOSIS — A499 Bacterial infection, unspecified: Secondary | ICD-10-CM | POA: Diagnosis not present

## 2015-10-07 LAB — WET PREP FOR TRICH, YEAST, CLUE
TRICH WET PREP: NONE SEEN
WBC WET PREP: NONE SEEN
YEAST WET PREP: NONE SEEN

## 2015-10-07 LAB — HIV ANTIBODY (ROUTINE TESTING W REFLEX): HIV: NONREACTIVE

## 2015-10-07 LAB — HEPATITIS C ANTIBODY: HCV AB: NEGATIVE

## 2015-10-07 LAB — HEPATITIS B SURFACE ANTIGEN: Hepatitis B Surface Ag: NEGATIVE

## 2015-10-07 MED ORDER — TINIDAZOLE 500 MG PO TABS
ORAL_TABLET | ORAL | Status: DC
Start: 2015-10-07 — End: 2017-06-21

## 2015-10-07 MED ORDER — FLUCONAZOLE 150 MG PO TABS: 150.0000 mg | ORAL_TABLET | Freq: Once | ORAL | Status: DC

## 2015-10-07 NOTE — Patient Instructions (Signed)
Fluconazole tablets Qu es este medicamento? El FLUCONAZOL es un medicamento antimictico. Se utiliza para tratar ciertos tipos de infecciones micticas o por levadura. Este medicamento puede ser utilizado para otros usos; si tiene alguna pregunta consulte con su proveedor de atencin mdica o con su farmacutico. Qu le debo informar a mi profesional de la salud antes de tomar este medicamento? Necesita saber si usted presenta alguno de los siguientes problemas o situaciones: -antecedentes de pulso cardaco irregular -enfermedad renal -una reaccin alrgica o inusual al fluconazol, a otros azoles u otros medicamentos, alimentos, colorantes o conservantes -si est embarazada o buscando quedar embarazada -si est amamantando a un beb Cmo debo utilizar este medicamento? Tome este medicamento por va oral. Siga las instrucciones de la etiqueta del [place]. No tome su medicamento con una frecuencia mayor a la indicada. Hable con su pediatra para informarse acerca del uso de este medicamento en nios. Puede requerir atencin especial. Este medicamento ha sido recetado a nios tan menores como de 6 meses de [place]. Sobredosis: Pngase en contacto inmediatamente con un centro toxicolgico o una sala de urgencia si usted cree que haya tomado demasiado medicamento. ATENCIN: [employer] es solo para usted. No comparta este medicamento con nadie. Qu sucede si me olvido de una dosis? Si olvida una dosis, tmela lo antes posible. Si es casi la hora de la prxima dosis, tome slo esa dosis. No tome dosis adicionales o dobles. Qu puede interactuar con este medicamento? No tome esta medicina con ninguno de los siguientes medicamentos: -astemizol -ciertos medicamentos para el pulso cardiaco irregular, tales como dofetilida, dronedarona, quinidina -cisapride -eritromicina -lomitapida -otros medicamentos que prolongan el intervalo QT (causa un ritmo cardiaco  anormal) -pimozida -terfenadina -tioridazina -tolvaptn -ziprasidona Esta medicina tambin puede interactuar con los siguientes medicamentos: -medicamentos antivirales para el VIH o SIDA -pldoras anticonceptivas -ciertos antibiticos, tales como rifabutina, rifampicina -ciertos medicamentos para la presin sangunea, tales como amlodipina, isradipina, felodipino, hidroclorotiazida, losartn, nifedipina -ciertos medicamentos para el cncer, tales como ciclofosfamida, vinblastina, vincristina -ciertos medicamentos para el colesterol, tales como atorvastatina, lovastatina, fluvastatina, simvastatina -ciertos medicamentos para la depresin, ansiedad o trastornos psicticos, tales como amitriptilina, midazolam, nortriptilina, triazolam -ciertos medicamentos para la diabetes, tales como glipizida, gliburida, tolbutamida -ciertos medicamentos para Conservation officer, historic buildings, tales como alfentanilo, fentanilo, metadona -ciertos medicamentos para convulsiones, tales como carbamazepina, fenitona -ciertos medicamentos que tratan o previenen cogulos sanguneos, tales como warfarina -halofantrina -medicamentos que reducen su capacidad de combatir infecciones, tales como ciclosporina, prednisona, tacrolimo -los AINE, medicamentos para el dolor o inflamacin, tales como celecoxib, diclofenaco, flurbiprofeno, ibuprofeno, meloxicam, naproxeno -otros medicamentos para infecciones micticas -sirolims -teofilina -tofacitinib Puede ser que esta lista no menciona todas las posibles interacciones. Informe a su profesional de [employer]	[place] de [employer] productos a base de hierbas, medicamentos de [place] o suplementos nutritivos que est tomando. Si usted fuma, consume bebidas alcohlicas o si utiliza drogas ilegales, indqueselo tambin a su profesional de [employer]	[place]. Algunas sustancias pueden interactuar con su medicamento. A qu debo estar atento al usar [employer]? Visite a su mdico o a su profesional de la salud para  chequear su evolucin peridicamente. Si toma este medicamento durante un perodo de [employer], podr Customer service manager anlisis de [place]. Si los sntomas no mejoran, consulte a su mdico. Pueden transcurrir [employer] o meses de tratamiento antes de que algunas infecciones micticas se curen. El alcohol puede aumentar la posibilidad de daos al hgado. Evite consumir bebidas alcohlicas. Si tiene una infeccin vaginal, no tenga Radio broadcast assistant que [place]  completado el tratamiento. Puede usar una toallita higinica. No use tampones. Use ropa interior de algodn no sintticos, y recin lavadas. Qu efectos secundarios puedo tener al [employer] este medicamento? Efectos secundarios que debe informar a su mdico o a Barrister's clerk de la salud tan pronto como sea posible: -Chief of Staff como erupcin cutnea, picazn o urticarias, hinchazn de los labios, boca, lengua o garganta -orina de color amarillo oscuro -sensacin de mareos o desmayos -pulso cardiaco irregular o dolor en el pecho -enrojecimiento, formacin de ampollas, descamacin o distensin de la piel, inclusive dentro de la boca -dificultad al respirar -sangrado o magulladuras inusuales -vmito -color amarillento de los ojos o la piel Efectos secundarios que, por lo general, no requieren Geophysical data processor (debe informarlos a su mdico o a Barrister's clerk de la salud si persisten o si son molestos): -cambios en el sabor de los alimentos -diarrea -dolor de cabeza -Higher education careers adviser o nuseas Puede ser que esta lista no menciona todos los posibles efectos secundarios. Comunquese a su mdico por asesoramiento mdico [employer]. Usted puede informar los efectos secundarios a la FDA por telfono al 1-800-FDA-1088. Dnde debo guardar mi medicina? Mantngala fuera del alcance de los nios. Gurdela a [employer], a menos de 30 grados C (22 grados F). Deseche todo el medicamento que no  haya utilizado, despus de la fecha de vencimiento. ATENCIN: Este folleto es un resumen. Puede ser que no cubra toda la posible informacin. Si usted tiene preguntas acerca de esta medicina, consulte con su mdico, su farmacutico o su profesional de Technical sales engineer.    2016, Elsevier/Gold Standard. (2014-05-02 00:00:00) Tinidazole tablets Qu es este medicamento? El TINIDAZOL es un medicamento antiinfeccioso. Se utiliza para tratar la amebiasis, giardiasis, tricomonosis y vaginosis. No es efectivo para resfros, gripe u otras infecciones de origen viral. Este medicamento puede ser utilizado para otros usos; si tiene alguna pregunta consulte con su proveedor de atencin mdica o con su farmacutico. Qu le debo informar a mi profesional de la salud antes de tomar este medicamento? Necesita saber si usted presenta alguno de los siguientes problemas o situaciones: -anemia u otros trastornos sanguneos -si consume bebidas alcohlicas con frecuencia -recibe hemodilisis -trastorno de convulsiones -una reaccin alrgica o inusual al tinidazol, a otros medicamentos, alimentos, colorantes o conservantes -si est embarazada o buscando quedar embarazada -si est amamantando a un beb Cmo debo utilizar este medicamento? Tome este medicamento por va oral con un vaso lleno de agua. Siga las instrucciones de la etiqueta del [place]. Tomar con alimentos. Tome sus dosis a intervalos regulares. No tome su medicamento con una frecuencia mayor a la indicada. Complete todas las dosis de su medicamento como se le haya indicado aun si se siente mejor. No omita ninguna dosis o suspenda el uso de su medicamento antes de lo indicado. Hable con su pediatra para informarse acerca del uso de este medicamento en nios. Aunque este medicamento ha sido recetado a nios tan menores como de 3 aos de edad para condiciones selectivas, las precauciones se aplican. Sobredosis: Pngase en contacto inmediatamente con un centro  toxicolgico o una sala de urgencia si usted cree que haya tomado demasiado medicamento. ATENCIN: [employer] es solo para usted. No comparta este medicamento con nadie. Qu sucede si me olvido de una dosis? Si olvida una dosis, tmela lo antes posible. Si es casi la hora de la prxima dosis, tome slo esa dosis. No tome dosis adicionales o dobles. Qu puede interactuar con este medicamento? No tome esta medicina con  ninguno de los siguientes medicamentos: -alcohol o cualquier producto que contenga alcohol -solucin oral de amprenavir -disulfiram -inyeccin de paclitaxel -solucin oral de ritonavir -solucin oral de sertralina -inyeccin de sulfametoxasol-trimetoprima Esta medicina tambin puede interactuar con los siguientes medicamentos: -colestiramina -cimetidina -conivaptn -ciclosporina -fluorouracilo -fosfenitona, fenitona -quetoconazol -litio -fenobarbital -tacrolimo -warfarina Puede ser que esta lista no menciona todas las posibles interacciones. Informe a su profesional de [employer]	[place] de [employer] productos a base de hierbas, medicamentos de [place] o suplementos nutritivos que est tomando. Si usted fuma, consume bebidas alcohlicas o si utiliza drogas ilegales, indqueselo tambin a su profesional de [employer]	[place]. Algunas sustancias pueden interactuar con su medicamento. A qu debo estar atento al usar [employer]? Si los sntomas no mejoran o si empeoran, consulte con su mdico o con su profesional de [employer]	[place]. Evite consumir las bebidas alcohlicas mientras toma este medicamento y durante tres das despus. El alcohol puede hacerle sentir mareado, enfermo o enrojecimiento. Si est recibiendo tratamiento para [place] enfermedad de transmisin sexual, evite todo contacto sexual hasta que haya terminado el [place]. Es posible que su pareja tambin necesite [place]. Qu efectos secundarios puedo tener al [employer] este medicamento? Efectos secundarios  que debe informar a su mdico o a Barrister's clerk de la salud tan pronto como sea posible: -Chief of Staff como erupcin cutnea, picazn o urticarias, hinchazn de la cara, labios o lengua -problemas respiratorios -confusin, depresin -manchas oscuras o blancas en la boca -sensacin de desmayos o mareos, cadas -fiebre, infeccin -entumecimiento, hormigueo, dolor o debilidad en las manos o pies -dolor al orinar -convulsiones -cansancio o debilidad inusual -irritacin o flujo vaginal -vmito Efectos secundarios que, por lo general, no requieren atencin mdica (debe informarlos a su mdico o a su profesional de la salud si persisten o si son molestos): -orina de color marrn oscuro o rojo -diarrea -dolor de cabeza -prdida del apetito -sabor metlico -nuseas -Higher education careers adviser Puede ser que esta lista no menciona todos los posibles efectos secundarios. Comunquese a su mdico por asesoramiento mdico [employer]. Usted puede informar los efectos secundarios a la FDA por telfono al 1-800-FDA-1088. Dnde debo guardar mi medicina? Mantngala fuera del alcance de los nios. Gurdela a [employer], entre 15 y 6 grados C (44 y 58 grados F). Protjala de la luz y de la humedad. Mantenga el envase bien cerrado. Deseche todo el medicamento que no haya utilizado, despus de la fecha de vencimiento. ATENCIN: Este folleto es un resumen. Puede ser que no cubra toda la posible informacin. Si usted tiene preguntas acerca de esta medicina, consulte con su mdico, su farmacutico o su profesional de Technical sales engineer.    2016, Elsevier/Gold Standard. (2014-05-01 00:00:00) Vaginosis bacteriana (Bacterial Vaginosis) La vaginosis bacteriana es una infeccin vaginal que perturba el equilibrio normal de las bacterias que se encuentran en la vagina. Es el resultado de un crecimiento excesivo de ciertas bacterias. Esta es la infeccin vaginal ms frecuente en mujeres en edad  reproductiva. El tratamiento es importante para prevenir complicaciones, especialmente en mujeres embarazadas, dado que puede causar un parto prematuro. CAUSAS  La vaginosis bacteriana se origina por un aumento de bacterias nocivas que, generalmente, estn presentes en cantidades ms pequeas en la vagina. Varios tipos diferentes de bacterias pueden causar esta afeccin. Sin embargo, la causa de su desarrollo no se comprende totalmente. FACTORES DE RIESGO Ciertas actividades o comportamientos pueden exponerlo a un mayor riesgo de desarrollar vaginosis bacteriana, entre los que se incluyen:  Tener una nueva pareja sexual o  mltiples parejas sexuales.  Las duchas vaginales  El uso del DIU (dispositivo intrauterino) como mtodo anticonceptivo. El contagio no se produce en baos, por ropas de cama, en piscinas o por contacto con objetos. SIGNOS Y SNTOMAS  Algunas mujeres que padecen vaginosis bacteriana no presentan signos ni sntomas. Los sntomas ms comunes son:  Secrecin vaginal de color grisceo.  Secrecin vaginal con olor similar al [employer], especialmente despus de Retail banker.  Picazn o sensacin de ardor en la vagina o la vulva.  Ardor o dolor al [employer]. DIAGNSTICO  Su mdico analizar su historia clnica y le examinar la vagina para detectar signos de vaginosis bacteriana. Puede tomarle Truddie Coco de flujo vaginal. Su mdico examinar esta muestra con un microscopio para controlar las bacterias y clulas anormales. Tambin puede realizarse un anlisis del pH vaginal.  TRATAMIENTO  La vaginosis bacteriana puede tratarse con antibiticos, en forma de comprimidos o de crema vaginal. Puede indicarse una segunda tanda de antibiticos si la afeccin se repite despus del tratamiento. Debido a que la vaginosis bacteriana aumenta el riesgo de contraer enfermedades de transmisin sexual, el tratamiento puede ayudar a reducir el riesgo de clamidia, [place], VIH y  herpes. [place] solo medicamentos de venta libre o recetados, segn las indicaciones del mdico.  Si le han recetado antibiticos, tmelos como se le indic. Asegrese de que finaliza la prescripcin completa aunque se sienta mejor.  Comunique a sus compaeros sexuales que sufre una infeccin vaginal. Deben consultar a su mdico y recibir tratamiento si tienen problemas, como picazn o una erupcin cutnea leve.  Durante el [place], es importante que siga estas indicaciones:  Visual merchandiser relaciones sexuales o use preservativos de la forma correcta.  No se haga duchas vaginales.  Evite consumir alcohol como se lo haya indicado el mdico.  Community education officer se lo haya indicado el mdico. SOLICITE ATENCIN MDICA SI:   Sus sntomas no mejoran despus de 3 das de [place].  Aumenta la secrecin o Conservation officer, historic buildings.  Tiene fiebre. ASEGRESE DE QUE:   Comprende estas instrucciones.  Controlar su afeccin.  Recibir ayuda de inmediato si no mejora o si empeora. PARA OBTENER MS INFORMACIN  Centros para el control y la prevencin de Probation officer for Disease Control and Prevention, CDC): AppraiserFraud.fi Asociacin Estadounidense de la Salud Sexual (American Sexual Health Association, SHA): www.ashastd.org    Esta informacin no tiene Marine scientist el consejo del mdico. Asegrese de hacerle al mdico cualquier pregunta que tenga.   Document Released: 06/16/2007 Document Revised: 03/30/2014 Elsevier Interactive Patient Education [employer].

## 2015-10-07 NOTE — Progress Notes (Signed)
   HPI: Is a 50 year old who presented to the office today stating that for the past several days is complaining of a slight white discharge and vulvar pruritus. Several years ago she had a hysterectomy. She states that her partner travels and is away from home many times in the month and she would like to have an STD screen as well. She denies any dysuria, frequency, fever, chills, nausea, vomiting. No GI complaints. No weight changes and no changes in appetite.   ROS: A ROS was performed and pertinent positives and negatives are included in the history.  GENERAL: No fevers or chills. HEENT: No change in vision, no earache, sore throat or sinus congestion. NECK: No pain or stiffness. CARDIOVASCULAR: No chest pain or pressure. No palpitations. PULMONARY: No shortness of breath, cough or wheeze. GASTROINTESTINAL: No abdominal pain, nausea, vomiting or diarrhea, melena or bright red blood per rectum. GENITOURINARY: No urinary frequency, urgency, hesitancy or dysuria. MUSCULOSKELETAL: No joint or muscle pain, no back pain, no recent trauma. DERMATOLOGIC: No rash, no itching, no lesions. ENDOCRINE: No polyuria, polydipsia, no heat or cold intolerance. No recent change in weight. HEMATOLOGICAL: No anemia or easy bruising or bleeding. NEUROLOGIC: No headache, seizures, numbness, tingling or weakness. PSYCHIATRIC: No depression, no loss of interest in normal activity or change in sleep pattern.   PE: Blood pressure 120/82 Gen. appearance well-developed by nursing the with above-mentioned complaining Abdomen: Soft nontender no rebound or guarding Pelvic: Bartholin urethra Skene was within normal limits Vagina: No lesions or discharge Vaginal cuff intact Bimanual exam not done Rectal exam not done  Wet prep many clue cell tumors to count bacteria   Assessment Plan: Clinical evidence of bacterial vaginosis will be treated with Tindamax 4 tablets today and for talus tomorrow. She was instructed on hold off  on her statin for at least 3-4 days. Also because of her external pruritus I'm going to give her prescription Diflucan 150 mg to take 1 by mouth today as well. To complete her STD screening she will have the following done today: GC and Chlamydia culture, HIV, RPR, hepatitis B and C.    Greater than 50% of time was spent in counseling and coordinating care of this patient.   Time of consultation: 15   Minutes.

## 2015-10-08 LAB — GC/CHLAMYDIA PROBE AMP
CT PROBE, AMP APTIMA: NOT DETECTED
GC PROBE AMP APTIMA: NOT DETECTED

## 2015-10-08 LAB — RPR

## 2016-01-08 ENCOUNTER — Other Ambulatory Visit: Payer: Self-pay | Admitting: Gynecology

## 2016-01-08 DIAGNOSIS — Z1231 Encounter for screening mammogram for malignant neoplasm of breast: Secondary | ICD-10-CM

## 2016-01-31 ENCOUNTER — Ambulatory Visit: Payer: 59

## 2016-03-02 ENCOUNTER — Emergency Department (HOSPITAL_BASED_OUTPATIENT_CLINIC_OR_DEPARTMENT_OTHER): Payer: Worker's Compensation

## 2016-03-02 ENCOUNTER — Emergency Department (HOSPITAL_BASED_OUTPATIENT_CLINIC_OR_DEPARTMENT_OTHER)
Admission: EM | Admit: 2016-03-02 | Discharge: 2016-03-02 | Disposition: A | Discharge status: Home / Self Care | Payer: Worker's Compensation | Attending: Emergency Medicine | Admitting: Emergency Medicine

## 2016-03-02 ENCOUNTER — Encounter (HOSPITAL_BASED_OUTPATIENT_CLINIC_OR_DEPARTMENT_OTHER): Payer: Self-pay | Admitting: *Deleted

## 2016-03-02 DIAGNOSIS — Y9389 Activity, other specified: Secondary | ICD-10-CM | POA: Insufficient documentation

## 2016-03-02 DIAGNOSIS — T23012A Burn of unspecified degree of left thumb (nail), initial encounter: Secondary | ICD-10-CM | POA: Diagnosis present

## 2016-03-02 DIAGNOSIS — X17XXXA Contact with hot engines, machinery and tools, initial encounter: Secondary | ICD-10-CM | POA: Diagnosis not present

## 2016-03-02 DIAGNOSIS — Y990 Civilian activity done for income or pay: Secondary | ICD-10-CM | POA: Insufficient documentation

## 2016-03-02 DIAGNOSIS — T2122XA Burn of second degree of abdominal wall, initial encounter: Secondary | ICD-10-CM | POA: Diagnosis not present

## 2016-03-02 DIAGNOSIS — T23222A Burn of second degree of single left finger (nail) except thumb, initial encounter: Secondary | ICD-10-CM | POA: Insufficient documentation

## 2016-03-02 DIAGNOSIS — Z79899 Other long term (current) drug therapy: Secondary | ICD-10-CM | POA: Insufficient documentation

## 2016-03-02 DIAGNOSIS — Y929 Unspecified place or not applicable: Secondary | ICD-10-CM | POA: Insufficient documentation

## 2016-03-02 DIAGNOSIS — T23212A Burn of second degree of left thumb (nail), initial encounter: Secondary | ICD-10-CM | POA: Diagnosis not present

## 2016-03-02 DIAGNOSIS — T300 Burn of unspecified body region, unspecified degree: Secondary | ICD-10-CM

## 2016-03-02 MED ORDER — BACITRACIN ZINC 500 UNIT/GM EX OINT
1.0000 "application " | TOPICAL_OINTMENT | Freq: Two times a day (BID) | CUTANEOUS | Status: DC
  Administered 2016-03-02: 1 via TOPICAL
  Filled 2016-03-02: qty 28.35

## 2016-03-02 MED ORDER — BACITRACIN ZINC 500 UNIT/GM EX OINT: 1.0000 "application " | TOPICAL_OINTMENT | Freq: Two times a day (BID) | CUTANEOUS | 0 refills | Status: DC

## 2016-03-02 NOTE — ED Triage Notes (Signed)
Pt c/o left index and thumb injury at work x 1 hr ago

## 2016-03-02 NOTE — ED Provider Notes (Signed)
[facility] DEPT [facility] Provider Note   CSN: [id number] Arrival date & time: 03/02/16  1548 By signing my name below, I, Natalie Martinez, attest that this documentation has been prepared under the direction and in the presence of Natalie Martinez. Electronically Signed: Doran Martinez, ED Scribe. 03/02/16. 4:15 PM.  History   Chief Complaint Chief Complaint  Patient presents with  . Finger Injury   The history is provided by the patient. No language interpreter was used.   HPI Comments: Natalie Martinez is a 50 y.o. female who presents to the Emergency Department with no pertinent PMHx complaining of left index and left thumb injury while at work 1 hour ago. Pt was using a drill bit at work when her glove got caught causing her index finger and thumb to come in contact with the drill bit while it was spinning, burning her finger. Pt denies any fevers, chills, anxiety, SI, or any other symptoms at this time. Pt last tetanus shot was 2 months ago.   Past Medical History:  Diagnosis Date  . Allergy   . Hypercholesterolemia   . NSVD (normal spontaneous vaginal delivery)    X3  . Vertigo     Patient Active Problem List   Diagnosis Date Noted  . Varicose veins of lower extremities with other complications [id number]  . ASCUS (atypical squamous cells of undetermined significance) on Pap smear 04/06/2012  . Weight gain 11/06/2011  . 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 Condyloma acuminatum in female 04/03/2011  . Hypercholesterolemia   . ACUTE CYSTITIS 07/01/2009  . HYPERLIPIDEMIA 05/27/2009  . CONSTIPATION 03/21/2008  . MICROSCOPIC HEMATURIA 03/21/2008    Past Surgical History:  Procedure Laterality Date  . CERVICAL BIOPSY  W/ LOOP ELECTRODE EXCISION  2004  &  2005   X 2  . [place] OF UTERUS  2000 & 2003  . VAGINAL HYSTERECTOMY  2005   RECURRENT CERVICAL DYSPLASIA    OB History    Gravida Para Term Preterm AB Living   3 3 3     3      SAB TAB Ectopic Multiple Live Births           3       Home Medications    Prior to Admission medications   Medication Sig Start Date End Date Taking? Authorizing Provider  simvastatin (ZOCOR) 20 MG tablet Take 1 tablet (20 mg total) by mouth every evening. PATIENT NEEDS OFFICE VISIT/FASTING LABS FOR ADDITIONAL REFILLS 01/31/15   Terrance Mass, MD  tinidazole [facility]) 500 MG tablet Take four tablets today and four tablets tomorrow at the same time 10/07/15   Terrance Mass, MD    Family History Family History  Problem Relation Age of Onset  . Diabetes Mother     Social History Social History  Substance Use Topics  . Smoking status: Never Smoker  . Smokeless tobacco: Never Used  . Alcohol use No     Allergies   Patient has no known allergies.   Review of Systems Review of Systems  Constitutional: Negative for chills and fever.  Skin: Positive for wound.  Psychiatric/Behavioral: Negative for suicidal ideas. The patient is not nervous/anxious.    Physical Exam Updated Vital Signs BP 131/76 (BP Location: Left Arm)   Pulse 81   Temp 98 F (36.7 C) (Oral)   Resp 16   Ht 5\' 1"  (1.549 m)   Wt 148 lb (67.1 kg)   SpO2 100%   BMI  27.96 kg/m   Physical Exam Physical Exam  Constitutional: Pt appears well-developed and well-nourished. No distress.  HENT:  Head: Normocephalic and atraumatic.  Eyes: Conjunctivae are normal.  Neck: Normal range of motion.  Cardiovascular: Normal rate, regular rhythm and intact distal pulses.   Capillary refill < 3 sec  Pulmonary/Chest: Effort normal and breath sounds normal.  Musculoskeletal: Pt exhibits tenderness over the posterior left index proximal phalanx and posterior left thumb proximally. Pt exhibits no edema.  ROM: 5/5  Neurological: Pt  is alert. Coordination normal.  Sensation 5/5 Strength 5/5  Skin: Skin is warm and dry. Pt is not diaphoretic.  No tenting of the skin  1x1 cm vesicle to posterior left  thumb Psychiatric: Pt has a normal mood and affect.  Nursing note and vitals reviewed.   ED Treatments / Results  DIAGNOSTIC STUDIES: Oxygen Saturation is 100% on room air, normal by my interpretation.    COORDINATION OF CARE: 4:16 PM Discussed treatment plan with pt at bedside and pt agreed to plan.  Labs (all labs ordered are listed, but only abnormal results are displayed) Labs Reviewed - No data to display  EKG  EKG Interpretation None       Radiology Dg Hand Complete Left  Result Date: 03/02/2016 CLINICAL DATA:  Injury. EXAM: LEFT HAND - COMPLETE 3+ VIEW COMPARISON:  No prior. FINDINGS: No acute bony or joint abnormality identified. No evidence of fracture or dislocation. Soft tissue injury is noted adjacent to the first interphalangeal joint. No radiopaque foreign body. IMPRESSION: Soft tissue injury noted adjacent to the first interphalangeal joint. No acute bony abnormality. Electronically Signed   By: Marcello Moores  Register   On: 03/02/2016 16:57    Procedures Procedures (including critical care time)  Medications Ordered in ED Medications - No data to display   Initial Impression / Assessment and Plan / ED Course  I have reviewed the triage vital signs and the nursing notes.  Pertinent labs & imaging results that were available during my care of the patient were reviewed by me and considered in my medical decision making (see chart for details).  Clinical Course     Superficial and partial-thickness burns to proximal posterior left index finger and thumb, no circumferential involvement, intact/brisk capillary refill distally, range of motion strength intact, no fractures. Routine burn care. Primary care follow-up. Tetanus is up-to-date.  Final Clinical Impressions(s) / ED Diagnoses   Final diagnoses:  Superficial burn  Partial thickness burn of abdomen, initial encounter    New Prescriptions Discharge Medication List as of 03/02/2016  5:15 PM    START  taking these medications   Details  bacitracin ointment Apply 1 application topically 2 (two) times daily., Starting Mon 03/02/2016, Print       I personally performed the services described in this documentation, which was scribed in my presence. The recorded information has been reviewed and is accurate.       Natalie Circle, PA-C 03/02/16 1839    Orlie Dakin, MD 03/03/16 [phone]

## 2016-03-06 ENCOUNTER — Ambulatory Visit
Admission: RE | Admit: 2016-03-06 | Discharge: 2016-03-06 | Disposition: A | Discharge status: Home / Self Care | Payer: 59 | Source: Ambulatory Visit | Attending: Gynecology | Admitting: Gynecology

## 2016-03-06 DIAGNOSIS — Z1231 Encounter for screening mammogram for malignant neoplasm of breast: Secondary | ICD-10-CM

## 2016-03-26 DIAGNOSIS — H539 Unspecified visual disturbance: Secondary | ICD-10-CM | POA: Insufficient documentation

## 2016-03-26 DIAGNOSIS — M179 Osteoarthritis of knee, unspecified: Secondary | ICD-10-CM | POA: Diagnosis not present

## 2016-03-26 DIAGNOSIS — R82998 Other abnormal findings in urine: Secondary | ICD-10-CM | POA: Insufficient documentation

## 2016-03-26 DIAGNOSIS — E559 Vitamin D deficiency, unspecified: Secondary | ICD-10-CM | POA: Diagnosis not present

## 2016-03-26 DIAGNOSIS — E7800 Pure hypercholesterolemia, unspecified: Secondary | ICD-10-CM | POA: Diagnosis not present

## 2016-03-26 DIAGNOSIS — M1710 Unilateral primary osteoarthritis, unspecified knee: Secondary | ICD-10-CM | POA: Insufficient documentation

## 2016-06-15 ENCOUNTER — Encounter

## 2016-06-19 ENCOUNTER — Inpatient Hospital Stay: Admit: 2016-06-19 | Payer: PRIVATE HEALTH INSURANCE | Attending: Obstetrics & Gynecology | Primary: Internal Medicine

## 2016-06-19 DIAGNOSIS — Z1231 Encounter for screening mammogram for malignant neoplasm of breast: Secondary | ICD-10-CM

## 2016-08-05 ENCOUNTER — Encounter: Payer: Self-pay | Admitting: Gynecology

## 2016-08-07 ENCOUNTER — Other Ambulatory Visit: Payer: Self-pay | Admitting: Urgent Care

## 2016-08-12 DIAGNOSIS — L814 Other melanin hyperpigmentation: Secondary | ICD-10-CM | POA: Diagnosis not present

## 2016-08-12 DIAGNOSIS — L818 Other specified disorders of pigmentation: Secondary | ICD-10-CM | POA: Diagnosis not present

## 2016-12-18 ENCOUNTER — Ambulatory Visit (INDEPENDENT_AMBULATORY_CARE_PROVIDER_SITE_OTHER): Payer: 59 | Admitting: Emergency Medicine

## 2016-12-18 ENCOUNTER — Encounter: Payer: Self-pay | Admitting: Emergency Medicine

## 2016-12-18 VITALS — BP 112/64 | HR 67 | Resp 16 | Ht 61.0 in | Wt 151.0 lb

## 2016-12-18 DIAGNOSIS — M545 Low back pain, unspecified: Secondary | ICD-10-CM

## 2016-12-18 DIAGNOSIS — E785 Hyperlipidemia, unspecified: Secondary | ICD-10-CM

## 2016-12-18 DIAGNOSIS — Z23 Encounter for immunization: Secondary | ICD-10-CM | POA: Diagnosis not present

## 2016-12-18 DIAGNOSIS — M5416 Radiculopathy, lumbar region: Secondary | ICD-10-CM | POA: Insufficient documentation

## 2016-12-18 DIAGNOSIS — G8929 Other chronic pain: Secondary | ICD-10-CM

## 2016-12-18 NOTE — Progress Notes (Signed)
Natalie Martinez 51 y.o.   Chief Complaint  Patient presents with  . Follow-up    patient present to office to discuss her cholesterol and arthritis.    HISTORY OF PRESENT ILLNESS: This is a 51 y.o. female with h/o high cholesterol off medication now; wants testing; also has h/o chronic low back pain with intermittent flare-ups. No other significant symptoms.  HPI   Prior to Admission medications   Medication Sig Start Date End Date Taking? Authorizing Provider  simvastatin (ZOCOR) 20 MG tablet Take 1 tablet (20 mg total) by mouth every evening. PATIENT NEEDS OFFICE VISIT/FASTING LABS FOR ADDITIONAL REFILLS 01/31/15  Yes Terrance Mass, MD  bacitracin ointment Apply 1 application topically 2 (two) times daily. Patient not taking: Reported on 12/18/2016 03/02/16   [name], PA-C  tinidazole [facility]) 500 MG tablet Take four tablets today and four tablets tomorrow at the same time Patient not taking: Reported on 12/18/2016 10/07/15   Terrance Mass, MD    No Known Allergies  Patient Active Problem List   Diagnosis Date Noted  . Varicose veins of lower extremities with other complications 01/75/1025  . ASCUS (atypical squamous cells of undetermined significance) on Pap smear 04/06/2012  . Weight gain 11/06/2011  . 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 Condyloma acuminatum in female 04/03/2011  . Hypercholesterolemia   . ACUTE CYSTITIS 07/01/2009  . HYPERLIPIDEMIA 05/27/2009  . CONSTIPATION 03/21/2008  . MICROSCOPIC HEMATURIA 03/21/2008    Past Medical History:  Diagnosis Date  . Allergy   . Hypercholesterolemia   . NSVD (normal spontaneous vaginal delivery)    X3  . Vertigo     Past Surgical History:  Procedure Laterality Date  . CERVICAL BIOPSY  W/ LOOP ELECTRODE EXCISION  2004  &  2005   X 2  . [place] OF UTERUS  2000 & 2003  . VAGINAL HYSTERECTOMY  2005   RECURRENT CERVICAL DYSPLASIA    Social History    Social History  . Marital status: Single    Spouse name: N/A  . Number of children: 3  . Years of education: 15   Occupational History  . Glass blower/designer    Social History Main Topics  . Smoking status: Never Smoker  . Smokeless tobacco: Never Used  . Alcohol use No  . Drug use: No  . Sexual activity: Yes    Birth control/ protection: None, Surgical   Other Topics Concern  . Not on file   Social History Narrative   Lives at home with her daughter and her mother.   Occasional caffeine use (coffee twice weekly).   Right-handed.    Family History  Problem Relation Age of Onset  . Diabetes Mother      Review of Systems  Constitutional: Negative.  Negative for chills, fever, malaise/fatigue and weight loss.  HENT: Negative.  Negative for congestion, nosebleeds and sore throat.   Eyes: Negative.  Negative for blurred vision, double vision, discharge and redness.  Respiratory: Negative.  Negative for cough, hemoptysis and shortness of breath.   Cardiovascular: Negative.  Negative for chest pain, palpitations and leg swelling.  Gastrointestinal: Negative.  Negative for abdominal pain, blood in stool, constipation, diarrhea, melena, nausea and vomiting.  Genitourinary: Negative.  Negative for dysuria, hematuria and urgency.  Musculoskeletal: Positive for back pain (chronic).  Skin: Negative.  Negative for rash.  Neurological: Negative.  Negative for dizziness, sensory change, focal weakness and headaches.  Endo/Heme/Allergies: Negative.   Psychiatric/Behavioral:  The patient has insomnia.   All other systems reviewed and are negative.  Vitals:   12/18/16 0925  BP: 112/64  Pulse: 67  Resp: 16  SpO2: 99%     Physical Exam  Constitutional: She is oriented to person, place, and time. She appears well-developed and well-nourished.  HENT:  Head: Normocephalic and atraumatic.  Right Ear: External ear normal.  Left Ear: External ear normal.  Nose: Nose normal.   Mouth/Throat: Oropharynx is clear and moist.  Eyes: Pupils are equal, round, and reactive to light. Conjunctivae and EOM are normal.  Neck: Normal range of motion. Neck supple. No JVD present. No thyromegaly present.  Cardiovascular: Normal rate, regular rhythm, normal heart sounds and intact distal pulses.   Pulmonary/Chest: Effort normal and breath sounds normal.  Abdominal: Soft. Bowel sounds are normal. She exhibits no distension. There is no tenderness.  Musculoskeletal: Normal range of motion.       Lumbar back: Normal. She exhibits normal range of motion, no tenderness and no bony tenderness.  Lymphadenopathy:    She has no cervical adenopathy.  Neurological: She is alert and oriented to person, place, and time. She displays normal reflexes. No sensory deficit. She exhibits normal muscle tone.  Skin: Skin is warm and dry. Capillary refill takes less than 2 seconds. No rash noted.  Psychiatric: She has a normal mood and affect. Her behavior is normal.  Vitals reviewed.    ASSESSMENT & PLAN: Else was seen today for follow-up.  Diagnoses and all orders for this visit:  Chronic bilateral low back pain without sciatica -     CBC with Differential/Platelet -     Comprehensive metabolic panel  Dyslipidemia -     Lipid panel  Need for influenza vaccination -     Flu Vaccine QUAD 36+ mos IM    Patient Instructions  Dolor de espalda en adultos (Back Pain, Adult) El dolor de espalda es muy frecuente. A menudo mejora con el tiempo. La causa del dolor de espalda generalmente no es peligrosa. La [employer] de las personas puede aprender a Administrator, sports de espalda por s mismas. CUIDADOS EN EL HOGAR Controle su dolor de espalda a fin de Recruitment consultant cambio. Las siguientes indicaciones ayudarn a Education officer, community que pueda sentir:  Quarry manager. Comience con caminatas cortas sobre superficies planas si es posible. Trate de caminar un poco ms [place].  Haga  ejercicios con regularidad tal como le indic el mdico. El ejercicio ayuda a que su espalda se cure ms rpidamente. Tambin ayuda a prevenir futuras lesiones al [employer] fuertes y flexibles.  No se siente, conduzca ni permanezca de pie durante ms de 30 minutos.  No permanezca en la cama. Si hace reposo ms de 1 a 2 das, puede demorar su recuperacin.  Sea cuidadoso al inclinarse o levantar un objeto. Use una tcnica apropiada para levantar peso: ? [place]. ? Mantenga el objeto cerca del cuerpo. ? No gire.  Duerma sobre un [employer]. Recustese sobre un costado y [place]. Si se recuesta [employer], coloque una almohada debajo de las rodillas.  Tome los medicamentos solamente como se lo haya indicado el mdico.  Aplique hielo sobre la zona lesionada. ? Ponga el hielo en una bolsa plstica. ? Coloque una [employer] piel y la bolsa de hielo. ? Deje el hielo durante 30minutos, 2 a 3veces por da, durante los primeros 2 o 3das. Despus de eso, puede alternar [employer]  compresas de hielo y calor.  Evite sentir ansiedad o estrs. Encuentre maneras efectivas de lidiar con el estrs, Teacher, English as a foreign language ejercicio.  Mantenga un peso saludable. El peso excesivo ejerce tensin sobre la espalda. SOLICITE AYUDA SI:  Siente dolor que no se alivia con reposo o medicamentos.  Siente cada vez ms dolor que se extiende a las piernas o los glteos.  El dolor no mejora en una semana.  Siente dolor por la noche.  Pierde peso.  Siente escalofros o fiebre. SOLICITE AYUDA DE INMEDIATO SI:  No puede controlar su materia fecal (heces) o el pis (orina).  Siente debilidad en las piernas o los brazos.  Siente prdida de la sensibilidad (adormecimiento) en las piernas o los brazos.  Tiene malestar estomacal (nuseas) o vomita.  Siente dolor de estmago (abdominal).  Siente que se desvanece (se desmaya). Esta informacin no tiene Marine scientist  el consejo del mdico. Asegrese de hacerle al mdico cualquier pregunta que tenga. Document Released: 09/22/2010 Document Revised: 03/30/2014 Document Reviewed: 07/11/2013 Elsevier Interactive Patient Education  2018 Elsevier Inc.      Agustina Caroli, MD [facility]

## 2016-12-18 NOTE — Patient Instructions (Signed)
Dolor de espalda en adultos  (Back Pain, Adult)  El dolor de espalda es muy frecuente. A menudo mejora con el tiempo. La causa del dolor de espalda generalmente no es peligrosa. La mayora de las personas puede aprender a manejar el dolor de espalda por s mismas.  CUIDADOS EN EL HOGAR  Controle su dolor de espalda a fin de detectar algn cambio. Las siguientes indicaciones ayudarn a aliviar cualquier dolor que pueda sentir:   Mantngase activo. Comience con caminatas cortas sobre superficies planas si es posible. Trate de caminar un poco ms cada da.   Haga ejercicios con regularidad tal como le indic el mdico. El ejercicio ayuda a que su espalda se cure ms rpidamente. Tambin ayuda a prevenir futuras lesiones al mantener los msculos fuertes y flexibles.   No se siente, conduzca ni permanezca de pie durante ms de 30 minutos.   No permanezca en la cama. Si hace reposo ms de 1 a 2 das, puede demorar su recuperacin.   Sea cuidadoso al inclinarse o levantar un objeto. Use una tcnica apropiada para levantar peso:  ? Flexione las rodillas.  ? Mantenga el objeto cerca del cuerpo.  ? No gire.   Duerma sobre un colchn firme. Recustese sobre un costado y flexione las rodillas. Si se recuesta sobre la espalda, coloque una almohada debajo de las rodillas.   Tome los medicamentos solamente como se lo haya indicado el mdico.   Aplique hielo sobre la zona lesionada.  ? Ponga el hielo en una bolsa plstica.  ? Coloque una toalla entre la piel y la bolsa de hielo.  ? Deje el hielo durante 20minutos, 2 a 3veces por da, durante los primeros 2 o 3das. Despus de eso, puede alternar entre compresas de hielo y calor.   Evite sentir ansiedad o estrs. Encuentre maneras efectivas de lidiar con el estrs, como hacer ejercicio.   Mantenga un peso saludable. El peso excesivo ejerce tensin sobre la espalda.  SOLICITE AYUDA SI:   Siente dolor que no se alivia con reposo o medicamentos.   Siente cada vez ms  dolor que se extiende a las piernas o los glteos.   El dolor no mejora en una semana.   Siente dolor por la noche.   Pierde peso.   Siente escalofros o fiebre.  SOLICITE AYUDA DE INMEDIATO SI:   No puede controlar su materia fecal (heces) o el pis (orina).   Siente debilidad en las piernas o los brazos.   Siente prdida de la sensibilidad (adormecimiento) en las piernas o los brazos.   Tiene malestar estomacal (nuseas) o vomita.   Siente dolor de estmago (abdominal).   Siente que se desvanece (se desmaya).  Esta informacin no tiene como fin reemplazar el consejo del mdico. Asegrese de hacerle al mdico cualquier pregunta que tenga.  Document Released: 09/22/2010 Document Revised: 03/30/2014 Document Reviewed: 07/11/2013  Elsevier Interactive Patient Education  2018 Elsevier Inc.

## 2016-12-19 LAB — CBC WITH DIFFERENTIAL/PLATELET
BASOS: 0 %
Basophils Absolute: 0 10*3/uL (ref 0.0–0.2)
EOS (ABSOLUTE): 0.2 10*3/uL (ref 0.0–0.4)
EOS: 3 %
HEMOGLOBIN: 13.2 g/dL (ref 11.1–15.9)
Hematocrit: 40.2 % (ref 34.0–46.6)
IMMATURE GRANS (ABS): 0 10*3/uL (ref 0.0–0.1)
IMMATURE GRANULOCYTES: 0 %
LYMPHS: 31 %
Lymphocytes Absolute: 1.7 10*3/uL (ref 0.7–3.1)
MCH: 32.1 pg (ref 26.6–33.0)
MCHC: 32.8 g/dL (ref 31.5–35.7)
MCV: 98 fL — AB (ref 79–97)
MONOCYTES: 4 %
Monocytes Absolute: 0.2 10*3/uL (ref 0.1–0.9)
NEUTROS ABS: 3.5 10*3/uL (ref 1.4–7.0)
NEUTROS PCT: 62 %
Platelets: 259 10*3/uL (ref 150–379)
RBC: 4.11 x10E6/uL (ref 3.77–5.28)
RDW: 13.3 % (ref 12.3–15.4)
WBC: 5.6 10*3/uL (ref 3.4–10.8)

## 2016-12-19 LAB — LIPID PANEL
CHOL/HDL RATIO: 6 ratio — AB (ref 0.0–4.4)
Cholesterol, Total: 244 mg/dL — ABNORMAL HIGH (ref 100–199)
HDL: 41 mg/dL (ref >39)
LDL Calculated: 167 mg/dL — ABNORMAL HIGH (ref 0–99)
Triglycerides: 181 mg/dL — ABNORMAL HIGH (ref 0–149)
VLDL CHOLESTEROL CAL: 36 mg/dL (ref 5–40)

## 2016-12-19 LAB — COMPREHENSIVE METABOLIC PANEL
A/G RATIO: 2.2 (ref 1.2–2.2)
ALBUMIN: 4.8 g/dL (ref 3.5–5.5)
ALT: 9 IU/L (ref 0–32)
AST: 14 IU/L (ref 0–40)
Alkaline Phosphatase: 65 IU/L (ref 39–117)
BILIRUBIN TOTAL: 0.7 mg/dL (ref 0.0–1.2)
BUN / CREAT RATIO: 15 (ref 9–23)
BUN: 9 mg/dL (ref 6–24)
CALCIUM: 9.6 mg/dL (ref 8.7–10.2)
CO2: 23 mmol/L (ref 20–29)
Chloride: 105 mmol/L (ref 96–106)
Creatinine, Ser: 0.59 mg/dL (ref 0.57–1.00)
GFR, EST AFRICAN AMERICAN: 125 mL/min/{1.73_m2} (ref >59)
GFR, EST NON AFRICAN AMERICAN: 109 mL/min/{1.73_m2} (ref >59)
Globulin, Total: 2.2 g/dL (ref 1.5–4.5)
Glucose: 84 mg/dL (ref 65–99)
POTASSIUM: 4.9 mmol/L (ref 3.5–5.2)
Sodium: 143 mmol/L (ref 134–144)
TOTAL PROTEIN: 7 g/dL (ref 6.0–8.5)

## 2016-12-21 ENCOUNTER — Other Ambulatory Visit: Payer: Self-pay | Admitting: Emergency Medicine

## 2016-12-21 DIAGNOSIS — E782 Mixed hyperlipidemia: Secondary | ICD-10-CM

## 2016-12-21 MED ORDER — SIMVASTATIN 20 MG PO TABS: 20.0000 mg | ORAL_TABLET | Freq: Every evening | ORAL | 3 refills | Status: DC

## 2016-12-24 ENCOUNTER — Encounter: Payer: Self-pay | Admitting: *Deleted

## 2017-01-28 ENCOUNTER — Other Ambulatory Visit: Payer: Self-pay | Admitting: Obstetrics & Gynecology

## 2017-01-28 ENCOUNTER — Other Ambulatory Visit: Payer: Self-pay | Admitting: Gynecology

## 2017-01-28 DIAGNOSIS — Z139 Encounter for screening, unspecified: Secondary | ICD-10-CM

## 2017-03-08 ENCOUNTER — Ambulatory Visit
Admission: RE | Admit: 2017-03-08 | Discharge: 2017-03-08 | Disposition: A | Discharge status: Home / Self Care | Payer: 59 | Source: Ambulatory Visit | Attending: Obstetrics & Gynecology | Admitting: Obstetrics & Gynecology

## 2017-03-08 ENCOUNTER — Other Ambulatory Visit: Payer: Self-pay | Admitting: Occupational Medicine

## 2017-03-08 ENCOUNTER — Ambulatory Visit: Payer: Self-pay

## 2017-03-08 DIAGNOSIS — M545 Low back pain: Secondary | ICD-10-CM

## 2017-03-08 DIAGNOSIS — M25562 Pain in left knee: Secondary | ICD-10-CM

## 2017-03-08 DIAGNOSIS — Z139 Encounter for screening, unspecified: Secondary | ICD-10-CM

## 2017-03-08 DIAGNOSIS — Z1231 Encounter for screening mammogram for malignant neoplasm of breast: Secondary | ICD-10-CM | POA: Diagnosis not present

## 2017-03-30 ENCOUNTER — Ambulatory Visit
Admit: 2017-03-30 | Discharge: 2017-03-30 | Payer: PRIVATE HEALTH INSURANCE | Attending: Obstetrics & Gynecology | Primary: Internal Medicine

## 2017-03-30 DIAGNOSIS — Z01419 Encounter for gynecological examination (general) (routine) without abnormal findings: Secondary | ICD-10-CM

## 2017-03-30 DIAGNOSIS — N841 Polyp of cervix uteri: Secondary | ICD-10-CM

## 2017-03-30 LAB — AMB POC URINALYSIS DIP STICK MANUAL W/O MICRO
Bilirubin (UA POC): NEGATIVE
Blood (UA POC): NEGATIVE
Glucose (UA POC): NEGATIVE
Ketones (UA POC): NEGATIVE
Leukocyte esterase (UA POC): NEGATIVE
Nitrites (UA POC): NEGATIVE
Protein (UA POC): NEGATIVE
Specific gravity (UA POC): 1.02 (ref 1.001–1.035)
Urobilinogen (UA POC): 0.2 (ref 0.2–1)
pH (UA POC): 7.5 (ref 4.6–8.0)

## 2017-03-30 NOTE — Patient Instructions (Signed)
High Blood Pressure: Care Instructions  Your Care Instructions    If your blood pressure is usually above 130/80, you have high blood pressure, or hypertension. That means the top number is 130 or higher or the bottom number is 80 or higher, or both.  Despite what a lot of people think, high blood pressure usually doesn't cause headaches or make you feel dizzy or lightheaded. It usually has no symptoms. But it does increase your risk for heart attack, stroke, and kidney or eye damage. The higher your blood pressure, the more your risk increases.  Your doctor will give you a goal for your blood pressure. Your goal will be based on your health and your age.  Lifestyle changes, such as eating healthy and being active, are always important to help lower blood pressure. You might also take medicine to reach your blood pressure goal.  Follow-up care is a key part of your treatment and safety. Be sure to make and go to all appointments, and call your doctor if you are having problems. It's also a good idea to know your test results and keep a list of the medicines you take.  How can you care for yourself at home?  Medical treatment  ?? If you stop taking your medicine, your blood pressure will go back up. You may take one or more types of medicine to lower your blood pressure. Be safe with medicines. Take your medicine exactly as prescribed. Call your doctor if you think you are having a problem with your medicine.  ?? Talk to your doctor before you start taking aspirin every day. Aspirin can help certain people lower their risk of a heart attack or stroke. But taking aspirin isn't right for everyone, because it can cause serious bleeding.  ?? See your doctor regularly. You may need to see the doctor more often at first or until your blood pressure comes down.  ?? If you are taking blood pressure medicine, talk to your doctor before you take decongestants or anti-inflammatory medicine, such as ibuprofen.  Some of these medicines can raise blood pressure.  ?? Learn how to check your blood pressure at home.  Lifestyle changes  ?? Stay at a healthy weight. This is especially important if you put on weight around the waist. Losing even 10 pounds can help you lower your blood pressure.  ?? If your doctor recommends it, get more exercise. Walking is a good choice. Bit by bit, increase the amount you walk every day. Try for at least 30 minutes on most days of the week. You also may want to swim, bike, or do other activities.  ?? Avoid or limit alcohol. Talk to your doctor about whether you can drink any alcohol.  ?? Try to limit how much sodium you eat to less than 2,300 milligrams (mg) a day. Your doctor may ask you to try to eat less than 1,500 mg a day.  ?? Eat plenty of fruits (such as bananas and oranges), vegetables, legumes, whole grains, and low-fat dairy products.  ?? Lower the amount of saturated fat in your diet. Saturated fat is found in animal products such as milk, cheese, and meat. Limiting these foods may help you lose weight and also lower your risk for heart disease.  ?? Do not smoke. Smoking increases your risk for heart attack and stroke. If you need help quitting, talk to your doctor about stop-smoking programs and medicines. These can increase your chances of quitting for good.  When   should you call for help?  Call 911 anytime you think you may need emergency care. This may mean having symptoms that suggest that your blood pressure is causing a serious heart or blood vessel problem. Your blood pressure may be over 180/120.  ??For example, call 911 if:  ?? ?? You have symptoms of a heart attack. These may include:  ? Chest pain or pressure, or a strange feeling in the chest.  ? Sweating.  ? Shortness of breath.  ? Nausea or vomiting.  ? Pain, pressure, or a strange feeling in the back, neck, jaw, or upper belly or in one or both shoulders or arms.  ? Lightheadedness or sudden weakness.   ? A fast or irregular heartbeat.   ?? ?? You have symptoms of a stroke. These may include:  ? Sudden numbness, tingling, weakness, or loss of movement in your face, arm, or leg, especially on only one side of your body.  ? Sudden vision changes.  ? Sudden trouble speaking.  ? Sudden confusion or trouble understanding simple statements.  ? Sudden problems with walking or balance.  ? A sudden, severe headache that is different from past headaches.   ?? ?? You have severe back or belly pain.   ??Do not wait until your blood pressure comes down on its own. Get help right away.  ??Call your doctor now or seek immediate care if:  ?? ?? Your blood pressure is much higher than normal (such as 180/120 or higher), but you don't have symptoms.   ?? ?? You think high blood pressure is causing symptoms, such as:  ? Severe headache.  ? Blurry vision.   ??Watch closely for changes in your health, and be sure to contact your doctor if:  ?? ?? Your blood pressure measures higher than your doctor recommends at least 2 times. That means the top number is higher or the bottom number is higher, or both.   ?? ?? You think you may be having side effects from your blood pressure medicine.   Where can you learn more?  Go to InsuranceStats.ca.  Enter [id number] in the search box to learn more about "High Blood Pressure: Care Instructions."  Current as of: February 26, 2016  Content Version: 11.8  ?? 2006-2018 Healthwise, Incorporated. Care instructions adapted under license by [employer] (which disclaims liability or warranty for this information). If you have questions about a medical condition or this instruction, always ask your healthcare professional. Healthwise, Incorporated disclaims any warranty or liability for your use of this information.         Learning About Diuretics for High Blood Pressure  Introduction  Diuretics help to lower blood pressure. This reduces your risk of a heart  attack and stroke. It also reduces your risk of kidney disease.  Diuretics cause your kidneys to remove sodium and water. They also relax the blood vessel walls. These help lower your blood pressure.  Examples  ?? Chlorthalidone  ?? Hydrochlorothiazide  Possible side effects  There are some common side effects. They are:  ?? Too little potassium.  ?? Feeling dizzy.  ?? Rash.  ?? Urinating a lot.  ?? High blood sugar. (But this is not common.)  You may have other side effects. Check the information that comes with your medicine.  What to know about taking this medicine  ?? You may take other medicines for blood pressure. Diuretics can help those work better. They can also prevent extra fluid in your body.  ??  You may need to take potassium pills. Or you may have to watch how much potassium is in your food. Ask your doctor about this.  ?? You may need blood tests to check your kidneys and your potassium level.  ?? Take your medicines exactly as prescribed. Call your doctor if you think you are having a problem with your medicine.  ?? Check with your doctor or pharmacist before you use any other medicines. This includes over-the-counter medicines. Make sure your doctor knows all of the medicines, vitamins, herbal products, and supplements you take. Taking some medicines together can cause problems.  Where can you learn more?  Go to InsuranceStats.cahttp://www.healthwise.net/[employer]Help[employer]ctions.  Enter [phone] in the search box to learn more about "Learning About Diuretics for High Blood Pressure."  Current as of: February 26, 2016  Content Version: 11.8  ?? 2006-2018 Healthwise, Incorporated. Care instructions adapted under license by [employer] (which disclaims liability or warranty for this information). If you have questions about a medical condition or this instruction, always ask your healthcare professional. Healthwise, Incorporated disclaims any warranty or liability for your use of this information.          Well Visit, Ages 8718 to 3050: Care Instructions  Your Care Instructions    Physical exams can help you stay healthy. Your doctor has checked your overall health and may have suggested ways to take good care of yourself. He or she also may have recommended tests. At home, you can help prevent illness with healthy eating, regular exercise, and other steps.  Follow-up care is a key part of your treatment and safety. Be sure to make and go to all appointments, and call your doctor if you are having problems. It's also a good idea to know your test results and keep a list of the medicines you take.  How can you care for yourself at home?  ?? Reach and stay at a healthy weight. This will lower your risk for many problems, such as obesity, diabetes, heart disease, and high blood pressure.  ?? Get at least 30 minutes of physical activity on most days of the week. Walking is a good choice. You also may want to do other activities, such as running, swimming, cycling, or playing tennis or team sports. Discuss any changes in your exercise program with your doctor.  ?? Do not smoke or allow others to smoke around you. If you need help quitting, talk to your doctor about stop-smoking programs and medicines. These can increase your chances of quitting for good.  ?? Talk to your doctor about whether you have any risk factors for sexually transmitted infections (STIs). Having one sex partner (who does not have STIs and does not have sex with anyone else) is a good way to avoid these infections.  ?? Use birth control if you do not want to have children at this time. Talk with your doctor about the choices available and what might be best for you.  ?? Protect your skin from too much sun. When you're outdoors from 10 a.m. to 4 p.m., stay in the shade or cover up with clothing and a hat with a wide brim. Wear sunglasses that block UV rays. Even when it's cloudy, put broad-spectrum sunscreen (SPF 30 or higher) on any exposed skin.   ?? See a dentist one or two times a year for checkups and to have your teeth cleaned.  ?? Wear a seat belt in the car.  ?? Drink alcohol in moderation, if  at all. That means no more than 2 drinks a day for men and 1 drink a day for women.  Follow your doctor's advice about when to have certain tests. These tests can spot problems early.  For everyone  ?? Cholesterol. Have the fat (cholesterol) in your blood tested after age 50. Your doctor will tell you how often to have this done based on your age, family history, or other things that can increase your risk for heart disease.  ?? Blood pressure. Have your blood pressure checked during a routine doctor visit. Your doctor will tell you how often to check your blood pressure based on your age, your blood pressure results, and other factors.  ?? Vision. Talk with your doctor about how often to have a glaucoma test.  ?? Diabetes. Ask your doctor whether you should have tests for diabetes.  ?? Colon cancer. Have a test for colon cancer at age 13. You may have one of several tests. If you are younger than 76, you may need a test earlier if you have any risk factors. Risk factors include whether you already had a precancerous polyp removed from your colon or whether your parent, brother, sister, or child has had colon cancer.  For women  ?? Breast exam and mammogram. Talk to your doctor about when you should have a clinical breast exam and a mammogram. Medical experts differ on whether and how often women under 50 should have these tests. Your doctor can help you decide what is right for you.  ?? Pap test and pelvic exam. Begin Pap tests at age 53. A Pap test is the best way to find cervical cancer. The test often is part of a pelvic exam. Ask how often to have this test.  ?? Tests for sexually transmitted infections (STIs). Ask whether you should have tests for STIs. You may be at risk if you have sex with more than one person, especially if your partners do not wear condoms.   For men  ?? Tests for sexually transmitted infections (STIs). Ask whether you should have tests for STIs. You may be at risk if you have sex with more than one person, especially if you do not wear a condom.  ?? Testicular cancer exam. Ask your doctor whether you should check your testicles regularly.  ?? Prostate exam. Talk to your doctor about whether you should have a blood test (called a PSA test) for prostate cancer. Experts differ on whether and when men should have this test. Some experts suggest it if you are older than 33 and are African-American or have a father or brother who got prostate cancer when he was younger than 38.  When should you call for help?  Watch closely for changes in your health, and be sure to contact your doctor if you have any problems or symptoms that concern you.  Where can you learn more?  Go to InsuranceStats.ca.  Enter P072 in the search box to learn more about "Well Visit, Ages 50 to 36: Care Instructions."  Current as of: June 18, 2016  Content Version: 11.8  ?? 2006-2018 Healthwise, Incorporated. Care instructions adapted under license by [employer] (which disclaims liability or warranty for this information). If you have questions about a medical condition or this instruction, always ask your healthcare professional. Healthwise, Incorporated disclaims any warranty or liability for your use of this information.

## 2017-03-30 NOTE — Progress Notes (Signed)
Subjective:   52 y.o. female for Well Woman Check.  No LMP recorded. Patient has had a hysterectomy.    Social History: single partner, contraception - status post hysterectomy.  Pertinent past medical hstory: no history of DVT, CAD, DM, liver disease, migraines or smoking, but has a hx of hypertension..       ROS:  Feeling well. No dyspnea or chest pain on exertion.  No abdominal pain, change in bowel habits, black or bloody stools.  No urinary tract symptoms. GYN ROS: no breast pain or new or enlarging lumps on self exam, no vaginal bleeding, no discharge or pelvic pain, no hot flashes. No neurological complaints.    Objective:     Visit Vitals  Resp 16   Ht 5\' 4"  (1.626 m)   Wt 278 lb (126.1 kg)   SpO2 100%   BMI 47.72 kg/m??     The patient appears well, alert, oriented x 3, in no distress.  ENT normal.  Neck supple. No adenopathy or thyromegaly. PERLA. Lungs are clear, good air entry, no wheezes, rhonchi or rales. S1 and S2 normal, no murmurs, regular rate and rhythm. Abdomen soft without tenderness, guarding, mass or organomegaly. Extremities show no edema, normal peripheral pulses. Neurological is normal, no focal findings.    BREAST EXAM: breasts appear normal, no suspicious masses, no skin or nipple changes or axillary nodes    PELVIC EXAM: VULVA: normal appearing vulva with no masses, tenderness or lesions, VAGINA: normal appearing vagina with normal color and discharge, no lesions, CERVIX: normal appearing cervix without discharge or lesions, ADNEXA: normal adnexa in size, nontender and no masses    Assessment/Plan:   well woman  hypertension  pap smear  return annually or prn    ICD-10-CM ICD-9-CM    1. Well woman exam with routine gynecological exam Z01.419 V72.31 PAP IG, APTIMA HPV AND RFX 16/18,45 ([id number])   .

## 2017-03-31 ENCOUNTER — Inpatient Hospital Stay: Admit: 2017-03-31 | Payer: PRIVATE HEALTH INSURANCE | Primary: Internal Medicine

## 2017-03-31 MED ORDER — HYDROCHLOROTHIAZIDE 25 MG TAB
25 mg | ORAL_TABLET | Freq: Every day | ORAL | 0 refills | Status: DC
Start: 2017-03-31 — End: 2019-07-03

## 2017-06-21 ENCOUNTER — Encounter: Payer: Self-pay | Admitting: Family Medicine

## 2017-06-21 ENCOUNTER — Ambulatory Visit: Payer: 59 | Admitting: Family Medicine

## 2017-06-21 VITALS — BP 126/80 | HR 76 | Ht 61.0 in | Wt 157.0 lb

## 2017-06-21 DIAGNOSIS — J301 Allergic rhinitis due to pollen: Secondary | ICD-10-CM | POA: Diagnosis not present

## 2017-06-21 DIAGNOSIS — M85662 Other cyst of bone, left lower leg: Secondary | ICD-10-CM | POA: Insufficient documentation

## 2017-06-21 DIAGNOSIS — Z8619 Personal history of other infectious and parasitic diseases: Secondary | ICD-10-CM

## 2017-06-21 DIAGNOSIS — E785 Hyperlipidemia, unspecified: Secondary | ICD-10-CM

## 2017-06-21 DIAGNOSIS — Z0000 Encounter for general adult medical examination without abnormal findings: Secondary | ICD-10-CM | POA: Insufficient documentation

## 2017-06-21 LAB — COMPREHENSIVE METABOLIC PANEL
ALT: 15 U/L (ref 0–35)
AST: 18 U/L (ref 0–37)
Albumin: 4.2 g/dL (ref 3.5–5.2)
Alkaline Phosphatase: 70 U/L (ref 39–117)
BUN: 14 mg/dL (ref 6–23)
CALCIUM: 9.8 mg/dL (ref 8.4–10.5)
CHLORIDE: 101 meq/L (ref 96–112)
CO2: 30 mEq/L (ref 19–32)
CREATININE: 0.61 mg/dL (ref 0.40–1.20)
GFR: 110.6 mL/min (ref >60.00)
Glucose, Bld: 103 mg/dL — ABNORMAL HIGH (ref 70–99)
POTASSIUM: 4.3 meq/L (ref 3.5–5.1)
Sodium: 140 mEq/L (ref 135–145)
Total Bilirubin: 0.8 mg/dL (ref 0.2–1.2)
Total Protein: 7.6 g/dL (ref 6.0–8.3)

## 2017-06-21 LAB — CBC
HEMATOCRIT: 41.4 % (ref 36.0–46.0)
HEMOGLOBIN: 13.7 g/dL (ref 12.0–15.0)
MCHC: 33.2 g/dL (ref 30.0–36.0)
MCV: 95.5 fl (ref 78.0–100.0)
Platelets: 253 10*3/uL (ref 150.0–400.0)
RBC: 4.33 Mil/uL (ref 3.87–5.11)
RDW: 13.1 % (ref 11.5–15.5)
WBC: 4.9 10*3/uL (ref 4.0–10.5)

## 2017-06-21 LAB — LIPID PANEL
CHOLESTEROL: 202 mg/dL — AB (ref 0–200)
HDL: 43.7 mg/dL (ref >39.00)
LDL CALC: 125 mg/dL — AB (ref 0–99)
NonHDL: 158.68
TRIGLYCERIDES: 166 mg/dL — AB (ref 0.0–149.0)
Total CHOL/HDL Ratio: 5
VLDL: 33.2 mg/dL (ref 0.0–40.0)

## 2017-06-21 LAB — URINALYSIS, ROUTINE W REFLEX MICROSCOPIC
Bilirubin Urine: NEGATIVE
Ketones, ur: NEGATIVE
Leukocytes, UA: NEGATIVE
Nitrite: NEGATIVE
Specific Gravity, Urine: 1.025 (ref 1.000–1.030)
TOTAL PROTEIN, URINE-UPE24: NEGATIVE
URINE GLUCOSE: NEGATIVE
Urobilinogen, UA: 0.2 (ref 0.0–1.0)
pH: 6 (ref 5.0–8.0)

## 2017-06-21 LAB — TSH: TSH: 1.7 u[IU]/mL (ref 0.35–4.50)

## 2017-06-21 MED ORDER — METHYLPREDNISOLONE ACETATE 80 MG/ML IJ SUSP
80.0000 mg | Freq: Once | INTRAMUSCULAR | Status: AC
  Administered 2017-06-21: 80 mg via INTRAMUSCULAR

## 2017-06-21 MED ORDER — SIMVASTATIN 40 MG PO TABS: 40.0000 mg | ORAL_TABLET | Freq: Every day | ORAL | 1 refills | Status: DC

## 2017-06-21 MED ORDER — FLUTICASONE PROPIONATE 50 MCG/ACT NA SUSP: 2.0000 | Freq: Every day | NASAL | 2 refills | Status: DC

## 2017-06-21 NOTE — Addendum Note (Signed)
Addended by: Jon Billings on: 06/21/2017 04:31 PM   Modules accepted: Orders

## 2017-06-21 NOTE — Progress Notes (Addendum)
Subjective:  Patient ID: Natalie Martinez, female    DOB: [birth year]  Age: 52 y.o. MRN: [id number]  CC: Establish Care   HPI Natalie Martinez presents for establishment of care and for follow-up of her elevated cholesterol, allergy rhinitis and a bony cyst involving her left tibia.  She has taken for without issue for years for her elevated cholesterol.  An LDL of 167 note on chart review from September of 2018. She was restarted on zocor. She has a pmh of spring allergies with Blue Eye, water eyes, sneezing , runny nose and a cough without wheeze.  These usually last a couple of months for her.  She is using Zyrtec without relief.  Workers comp injury to left knee showed a bony cyst in left tibia in 12/18. She was advised to fu with her primary care. womens care per gyn. Mom with dm. Father passed at an early age from trauma. Lives with her 87 yo daughter has two other children ages 32 and 56.   History Natalie Martinez has a past medical history of Allergy, Hypercholesterolemia, NSVD (normal spontaneous vaginal delivery), and Vertigo.   She has a past surgical history that includes Cervical biopsy w/ loop electrode excision (2004  &  2005); Dilation and curettage of uterus (2000 & 2003); and Vaginal hysterectomy (2005).   Her family history includes Diabetes in her mother.She reports that she has never smoked. She has never used smokeless tobacco. She reports that she does not drink alcohol or use drugs.  Outpatient Medications Prior to Visit  Medication Sig Dispense Refill  . cetirizine (ZYRTEC) 10 MG tablet Take 10 mg by mouth daily.    . cyclobenzaprine (FLEXERIL) 5 MG tablet Take 5 mg by mouth every 8 (eight) hours as needed for muscle spasms.    . simvastatin (ZOCOR) 20 MG tablet Take 1 tablet (20 mg total) by mouth every evening. 90 tablet 3  . bacitracin ointment Apply 1 application topically 2 (two) times daily. (Patient not taking: Reported on 12/18/2016) 15 g 0  . tinidazole  (TINDAMAX) 500 MG tablet Take four tablets today and four tablets tomorrow at the same time (Patient not taking: Reported on 12/18/2016) 8 tablet 0   No facility-administered medications prior to visit.     ROS Review of Systems  Constitutional: Negative for chills, fatigue and unexpected weight change.  HENT: Positive for congestion, postnasal drip, rhinorrhea and sneezing. Negative for ear pain, sinus pressure, sinus pain and voice change.   Eyes: Positive for itching.  Respiratory: Positive for cough. Negative for shortness of breath and wheezing.   Cardiovascular: Negative.   Gastrointestinal: Negative.   Endocrine: Negative for polyphagia and polyuria.  Musculoskeletal: Negative for arthralgias and gait problem.  Skin: Negative for pallor and rash.  Allergic/Immunologic: Negative for immunocompromised state.  Neurological: Negative for weakness and headaches.  Hematological: Does not bruise/bleed easily.  Psychiatric/Behavioral: Negative.     Objective:  BP 126/80 (BP Location: Right Arm, Patient Position: Sitting, Cuff Size: Normal)   Pulse 76   Ht 5\' 1"  (1.549 m)   Wt 157 lb (71.2 kg)   SpO2 96%   BMI 29.66 kg/m   Physical Exam  Constitutional: She is oriented to person, place, and time. She appears well-developed and well-nourished. No distress.  HENT:  Head: Normocephalic and atraumatic.  Right Ear: External ear normal.  Left Ear: External ear normal.  Mouth/Throat: Oropharynx is clear and moist. No oropharyngeal exudate.  Eyes: Pupils are equal, round, and reactive to  light. Conjunctivae are normal. Right eye exhibits no discharge. Left eye exhibits no discharge. No scleral icterus.  Neck: Neck supple. No JVD present. No tracheal deviation present. No thyromegaly present.  Cardiovascular: Normal rate, regular rhythm and normal heart sounds.  Pulmonary/Chest: Effort normal and breath sounds normal. No stridor.  Lymphadenopathy:    She has no cervical adenopathy.    Neurological: She is alert and oriented to person, place, and time.  Skin: Skin is warm and dry. She is not diaphoretic.  Psychiatric: She has a normal mood and affect. Her behavior is normal.      Assessment & Plan:   Temitope was seen today for establish care.  Diagnoses and all orders for this visit:  Dyslipidemia with elevated low density lipoprotein (LDL) cholesterol and abnormally low high density lipoprotein cholesterol -     CBC -     Comprehensive metabolic panel -     Lipid panel -     TSH -     Urinalysis, Routine w reflex microscopic -     simvastatin (ZOCOR) 40 MG tablet; Take 1 tablet (40 mg total) by mouth at bedtime.  Seasonal allergic rhinitis due to pollen -     methylPREDNISolone acetate (DEPO-MEDROL) injection 80 mg -     fluticasone (FLONASE) 50 MCG/ACT nasal spray; Place 2 sprays into both nostrils daily.  Health care maintenance  History of condyloma acuminatum -     HIV antibody  Bone cyst of left tibia -     Ambulatory referral to Sports Medicine   I have discontinued Natalie Martinez tinidazole, bacitracin, and simvastatin. I am also having her start on fluticasone and simvastatin. Additionally, I am having her maintain her cetirizine and cyclobenzaprine. We administered methylPREDNISolone acetate.  Meds ordered this encounter  Medications  . methylPREDNISolone acetate (DEPO-MEDROL) injection 80 mg  . fluticasone (FLONASE) 50 MCG/ACT nasal spray    Sig: Place 2 sprays into both nostrils daily.    Dispense:  16 g    Refill:  2  . simvastatin (ZOCOR) 40 MG tablet    Sig: Take 1 tablet (40 mg total) by mouth at bedtime.    Dispense:  90 tablet    Refill:  1   Will adjust Zocor pending results of LDL cholesterol.  Will change patient from Zyrtec to Flonase.  Encouraged her to use this throughout her allergy season.  Incidental bony cyst seen on her knee films for her work-related knee injury appears benign to me.  Will send to sports medicine  for confirmation.  Follow-up: Return in about 3 months (around 09/20/2017).  Libby Maw, MD

## 2017-06-21 NOTE — Patient Instructions (Signed)
Alergias en los adultos Allergies, Adult Una alergia se produce cuando el sistema de defensa del cuerpo (sistema inmunitario) reacciona en exceso a una sustancia normalmente inofensiva (alrgeno), que se respira o se come, o entra en contacto con la piel. Al estar en contacto con algo a lo que se es alrgico, el sistema inmunitario produce ciertas protenas (anticuerpos). Estas protenas hacen que las clulas liberen sustancias qumicas (histaminas) que desencadenan los sntomas de una reaccin alrgica. Las alergias suelen afectar las fosas nasales (rinitis alrgica), los ojos (conjuntivitis alrgica), la piel (dermatitis atpica) y el estmago. Las alergias pueden ser leves o graves. No se pueden diseminar de una persona a otra (no es contagiosa). Pueden aparecer a cualquier edad y se pueden superar con los aos. Qu incrementa el riesgo? Puede tener un riesgo mayor de sufrir alergias si otras personas de su familia tienen alergias. Cules son los signos o los sntomas? Los sntomas dependen del tipo de alergia que tenga. Estos pueden incluir los siguientes:  Secrecin o congestin nasal.  Estornudos.  Picazn en la boca, los odos o la garganta.  Goteo posnasal.  Dolor de garganta.  Ojos rojos, lagrimosos, hinchados o con picazn.  Erupcin o ronchas en la piel.  Dolor de estmago.  Vmitos.  Diarrea.  Meteorismo.  Tos o sibilancias.  Las personas con una alergia grave a los alimentos, los medicamentos o la picadura de insectos pueden tener una reaccin alrgica potencialmente mortal (anafilaxia). Los sntomas de la anafilaxia incluyen:  Ronchas.  Picazn.  Cara enrojecida.  Labios, lengua o boca hinchados.  Hinchazn u opresin en la garganta.  Dolor u opresin en el pecho.  Dificultad para respirar o falta de aire.  Latidos cardacos rpidos.  Mareos o desmayos.  Vmitos.  Diarrea.  Dolor en el abdomen.  Cmo se diagnostica? Esta afeccin se  diagnostica en funcin de lo siguiente:  Sus sntomas.  Sus antecedentes mdicos y familiares.  Un examen fsico.  Es posible que necesite ver a un mdico especialista en el tratamiento de alergias (alergista). Tambin pueden hacerle exmenes, que incluyen los siguientes:  Pruebas de piel para detectar qu alrgenos causan los sntomas. por ejemplo: ? Prueba de puncin. En esta prueba, se pincha la piel con una aguja diminuta para exponerla a pequeas cantidades de posibles alrgenos y ver si la piel reacciona. ? Prueba cutnea intradrmica. En esta prueba, se inyecta una pequea cantidad de alrgeno debajo de la piel para ver si esta reacciona. ? Prueba de parche. En esta prueba, se coloca una pequea cantidad de alrgeno en la piel y luego se cubre con una venda. El mdico examinar la piel despus de un par de das para ver si hay una erupcin cutnea.  Anlisis de sangre.  Pruebas de provocacin. En esta prueba, debe inhalar una pequea cantidad de alrgeno por boca para ver si produce una reaccin alrgica.  Tambin pueden pedirle que:  Lleve un registro de alimentos. Un registro de alimentos incluye todos los alimentos y las bebidas que usted consume en un da, y cualquier sntoma que experimenta.  Practique una dieta de eliminacin. Una dieta de eliminacin implica eliminar alimentos especficos de la dieta y luego incorporarlos nuevamente, uno a la vez, para averiguar si hay uno en particular que le cause una reaccin alrgica.  Cmo se trata? El tratamiento para las alergias depende de los sntomas. El tratamiento puede incluir lo siguiente:  Compresas fras para aliviar la picazn y la hinchazn.  Gotas oftlmicas.  Aerosoles nasales.  Usar un   aerosol salino o lota nasal (neti pot) para lavar la nariz (irrigacin nasal). Estos mtodos pueden ayudar a Radiographer, therapeutic mucosidad y [employer] fosas nasales hmedas.  El uso de un humidificador.  Antihistamnicos orales u otros  medicamentos para Acupuncturist y la inflamacin.  Cremas para la piel para tratar las [place].  Cambios en la dieta para eliminar los desencadenantes de la alergia a los alimentos.  Exposicin repetida a cantidades diminutas de alrgenos para generar tolerancia y prevenir reacciones alrgicas futuras (inmunoterapia). Estas incluyen los siguientes: ? Investment banker, operational. ? Tratamiento por va oral. Esto incluye pequeas dosis de un alrgeno debajo de la lengua (inmunoterapia sublingual).  Inyeccin de epinefrina de Engineer, manufacturing systems) en caso de Secretary/administrator. Se trata de un medicamento autoinyectable y medido previamente que se debe Architectural technologist en los primeros minutos de una reaccin alrgica grave.  Siga estas indicaciones en su casa:  Evite los alrgenos conocidos, siempre que sea posible.  Si sufre de alergia por alrgenos que estn en el aire, lvese la nariz a diario. Puede usar un aerosol o una lota nasal (neti pot) para lavar la nariz (irrigacin nasal).  Tome los medicamentos de venta libre y los recetados solamente como se lo haya indicado el mdico.  Consulting civil engineer a todas las visitas de seguimiento como se lo haya indicado el mdico. Esto es importante.  Si tiene riesgo de sufrir Nurse, mental health grave (anafilaxia), lleve su autoinyector con usted en todo momento.  Si alguna vez ha tenido [place], use una pulsera o collar de alerta mdica que diga que usted tiene [place] grave. Comunquese con un mdico si:  Los sntomas no mejoran con Dispensing optician. Solicite ayuda de inmediato si:  Tiene sntomas de anafilaxia, como los siguientes: ? [place], lengua o garganta hinchadas. ? Dolor u opresin en el pecho. ? Dificultad para respirar o falta de aire. ? Mareos o [employer]. ? Dolor abdominal intenso, vmitos o diarrea. Esta informacin no tiene Marine scientist el consejo del mdico. Asegrese de hacerle al mdico  cualquier pregunta que tenga. Document Released: 03/09/2005 Document Revised: 07/22/2016 Document Reviewed: 09/25/2015 Elsevier Interactive Patient Education  2018 [place] High Cholesterol El colesterol elevado es una afeccin en la que la sangre tiene niveles altos de una sustancia [place], cerosa y parecida a la grasa (colesterol). El organismo humano necesita una pequea cantidad de colesterol. El hgado fabrica todo el colesterol que el organismo necesita. El exceso de colesterol proviene de los alimentos que comemos. La sangre transporta el colesterol desde el hgado a travs de los vasos sanguneos. Si tiene el colesterol elevado, este puede depositarse (formar placas) en las paredes de los vasos sanguneos (arterias). Las Occupational hygienist y la rigidez [place] arterias. Las placas de colesterol aumentan el riesgo de sufrir un infarto de miocardio y un accidente cerebrovascular. Trabaje con el mdico para [employer] concentraciones de colesterol en un rango saludable. Qu incrementa el riesgo? Es ms probable que Orthoptist en las personas que:  Consumen alimentos con alto contenido de grasa animal (grasa saturada) o colesterol.  Tienen sobrepeso.  No hacen suficiente ejercicio fsico.  Tienen antecedentes familiares de colesterol elevado.  Cules son los signos o los sntomas? Esta afeccin no presenta sntomas. Cmo se diagnostica? Esta afeccin podra diagnosticarse a [place] de anlisis de [place].  Si es mayor de 20aos, es posible que el mdico le controle el colesterol cada [id number].  Los controles pueden ser ms frecuentes si ya tuvo el colesterol elevado u otros factores de riesgo de enfermedades cardacas.  En el anlisis de sangre de [place], se determina lo siguiente:  El colesterol "malo" (colesterol LDL). Este es el principal tipo de colesterol que causa enfermedades cardacas. El  nivel recomendado de LDL es de menos de100.  El colesterol "bueno" (colesterol HDL). Este tipo ayuda a Tour manager las enfermedades [place] arterias y arrastrando el LDL. El nivel recomendado de HDL es de60 o superior.  Triglicridos. Estos son grasas que el organismo puede Financial controller o quemar como fuente de [place]. El nivel recomendado de triglicridos es de menos de 150.  Colesterol total. Esta es una medicin de la cantidad total de colesterol en la sangre, que incluye el colesterol LDL, el colesterol HDL y los triglicridos. El valor saludable es de menos de200.  Cmo se trata? Esta afeccin se trata con cambios en la dieta y en el estilo de vida, y con medicamentos. Cambios en la [employer], la ingesta de una mayor cantidad de cereales integrales, frutas, verduras, frutos secos y pescado.  Tambin podran incluir la reduccin del consumo de carnes rojas y alimentos con Architectural technologist. Cambios en el estilo de vida  Entre ellos, realizar sesiones de ejercicios aerbicos durante, por lo menos, 52minutos, 3veces por semana. Por ejemplo, caminar, andar en bicicleta y nadar. Los ejercicios aerbicos junto con una dieta sana pueden ayudar a que se [place] manager en un peso saludable.  Los cambios tambin podran incluir dejar de fumar. Medicamentos  Por lo general, se administran medicamentos si con los Licensed conveyancer y en el estilo de vida no se logra reducir el colesterol hasta niveles saludables.  El mdico podra recetarle estatinas. Se ha demostrado que las estatinas [employer] niveles de [place], lo que puede reducir el riesgo de Insurance risk surveyor una enfermedad cardaca. Siga estas indicaciones en su casa: Comida y bebida  Si se lo indic el mdico:  Coma pollo (sin piel), pescado, ternera, mariscos, pechuga de [place] y cortes de carne roja de pulpa o de lomo.  No coma alimentos fritos ni carnes grasosas, como salchichas y  salame.  Coma muchas frutas, como manzanas.  Coma gran cantidad de verduras, como brcoli, papas y zanahorias.  Coma porotos, guisantes secos y lentejas.  Coma cereales, como cebada, arroz, cuscs y trigo burgol.  Coma pastas sin salsas con crema.  [place], y coma yogures y quesos descremados o semidescremados.  No coma ni beba Mattel, crema, helado, yemas de huevo ni quesos duros.  No coma margarinas en barra ni untables que contengan grasas trans (que tambin se conocen como aceites parcialmente hidrogenados).  No coma aceites tropicales saturados, como el de coco y el de [place].  No coma tortas, galletas, galletitas ni otros productos horneados que contengan grasas trans.  Instrucciones generales  Haga ejercicio segn las indicaciones del mdico. Aumente la cantidad de ejercicio fsico que realiza mediante actividades como jardinera, salir a Writer o usar las escaleras.  Tome los medicamentos de venta libre y los recetados solamente como se lo haya indicado el mdico.  No consuma ningn producto que contenga nicotina o tabaco, como cigarrillos y Psychologist, sport and exercise. Si necesita ayuda para dejar de fumar, consulte al mdico.  Concurra a todas las visitas de control como se lo haya indicado el mdico. Esto es importante. Comunquese con un mdico si:  Tiene dificultad para seguir G[place] o  mantener un peso saludable.  Necesita ayuda para comenzar un programa de ejercicios.  Necesita ayuda para dejar de fumar. Solicite ayuda de inmediato si:  Electronics engineer.  Tiene dificultad para respirar. Esta informacin no tiene Marine scientist el consejo del mdico. Asegrese de hacerle al mdico cualquier pregunta que tenga. Document Released: 03/09/2005 Document Revised: 06/15/2016 Document Reviewed: 09/07/2015 Elsevier Interactive Patient Education  2018 [place] cantidad de actividad fsica  que realiza How to Increase Your Level of Physical Activity Realizar actividad fsica de forma regular es importante para la salud general y Musician. La [employer] de las personas no realizan suficiente actividad fsica. Hay muchas formas sencillas de aumentar la cantidad de actividad fsica que realiza, incluso si no ha estado muy activo en el pasado o si recin est comenzado. Por qu es importante realizar actividad fsica? La actividad fsica tiene muchos beneficios para la salud tanto a corto como a Barrister's clerk. Consecuencias de realizar actividad fsica con regularidad:  Ayudar a mantener un peso saludable o a Horticulturist, commercial.  Fortalecer los Apple Computer y los huesos.  Ayudar al Tora Duck de nimo y mejorar la [place].  Disminuir el riesgo de padecer ciertas enfermedades de larga duracin (crnicas), como enfermedades cardacas, cncer y diabetes.  Ayudarlo a que pueda seguir caminando y movindose (desplazarse) a medida que envejece.  Prevenir accidentes, como cadas, a medida que envejece.  Aumentar la expectativa de vida.  Cules son los beneficios de realizar actividad fsica de forma regular? Adems de [employer] Materials fsica, Optometrist actividad fsica la [employer] de los das puede ayudarlo de muchas formas que podra no Heritage manager. Algunos de los beneficios de realizar actividad fsica con regularidad:  Sentirse bien con su cuerpo.  Ser capaz de moverse con mayor facilidad y por perodos ms prolongados sin cansarse (mayor resistencia).  Encontrar nuevas fuentes de diversin y entretenimiento.  Conocer a Producer, television/film/video que comparten un inters comn.  Ser capaz de Energy manager las enfermedades (aumento de la inmunidad).  Dormir mejor.  Qu puede suceder si no realizo actividad fsica de forma regular? No realizar la suficiente cantidad de actividad fsica puede hacer que tenga un estilo de vida poco saludable y provocar problemas para la salud en un futuro. Esto puede  aumentar las posibilidades de lo siguiente:  Tener exceso de [place] u obesidad.  Enfermarse.  Desarrollar enfermedades crnicas, como enfermedades cardacas o diabetes.  Tener problemas de salud mental, como depresin o ansiedad.  Tener problemas para dormir.  Tener problemas para caminar o desplazarse (movilidad reducida).  Lastimarse en una cada cuando sea [place].  Qu puedo hacer para aumentar la cantidad de actividad fsica que realizo?  Pregunte al mdico cmo comenzar. Pregntele al mdico qu actividades son seguras para usted.  Comience de a poco. Financial trader ejercicios simples con sillas es un buen modo de Art gallery manager; en especial, si no ha estado activo antes o durante un largo perodo.  Intente encontrar actividades que disfrute. Es ms probable que se comprometa con una rutina de ejercicios si no la siente como una obligacin.  Si tiene problemas seos o articulares, elija ejercicios de bajo impacto, como caminar o nadar.  Incluya actividad fsica en su rutina diaria.  Invite a amigos y familiares a Careers adviser con usted. Esto tambin lo ayudar a comprometerse con Theatre manager de entrenamiento.  Establezca objetivos para los cuales trabaje.  Procure realizar, al menos, 149minutos de ejercicios de intensidad moderada todas las semanas. Por ejemplo,  caminar o andar en bicicleta. Dnde encontrar ms informacin:  Centros para Building surveyor y Publishing copy de Enfermedades(Centers for Disease Control and Prevention, CDC): BowlingGrip.is  Consejo Presidencial sobre Aptitud Fsica, Deportes y Nutricin (Kelly Services on [employer], Sports & Nutrition) www.http://villegas.org/  ChooseMyPlate: WirelessMortgages.dk Comunquese con un mdico si:  Tiene dolor de cabeza, dolores musculares o dolor articular.  Se siente mareado o siente que va a desvanecerse cuando realiza actividad fsica.  Se desmaya.  Siente  dolor en el pecho mientras realiza actividad fsica. Resumen  La actividad fsica es buena para la mente y el cuerpo a cualquier edad, incluso si recin comienza a [place]isure centre manager.  Si tiene una enfermedad crnica o no ha estado activo durante un [place], consulte con un mdico antes de aumentar la cantidad de [place] fsica.  Elija actividades que sean seguras para usted y que disfrute. Pregntele al mdico cules puede realizar sin correr [employer].  Comienzan lentamente. Informe al mdico si tiene problemas cuando comienza a aumentar la cantidad de actividad fsica que Musician. Esta informacin no tiene Marine scientist el consejo del mdico. Asegrese de hacerle al mdico cualquier pregunta que tenga. Document Released: 03/24/2015 Document Revised: 06/12/2016 Document Reviewed: 06/12/2016 Elsevier Interactive Patient Education  [employer].

## 2017-06-22 LAB — HIV ANTIBODY (ROUTINE TESTING W REFLEX): HIV 1&2 Ab, 4th Generation: NONREACTIVE

## 2017-07-16 ENCOUNTER — Ambulatory Visit: Payer: Self-pay | Admitting: Family Medicine

## 2017-07-19 ENCOUNTER — Ambulatory Visit: Payer: Self-pay | Admitting: Family Medicine

## 2017-07-19 NOTE — Progress Notes (Deleted)
Natalie Martinez - 52 y.o. female MRN [id number]  Date of birth: Aug 16, 1965  SUBJECTIVE:  Including CC & ROS.  No chief complaint on file.   Natalie Martinez is a 52 y.o. female that is  ***.  ***   Review of Systems  HISTORY: Past Medical, Surgical, Social, and Family History Reviewed & Updated per EMR.   Pertinent Historical Findings include:  Past Medical History:  Diagnosis Date  . Allergy   . Hypercholesterolemia   . NSVD (normal spontaneous vaginal delivery)    X3  . Vertigo     Past Surgical History:  Procedure Laterality Date  . CERVICAL BIOPSY  W/ LOOP ELECTRODE EXCISION  2004  &  2005   X 2  . [place] OF UTERUS  2000 & 2003  . VAGINAL HYSTERECTOMY  2005   RECURRENT CERVICAL DYSPLASIA    No Known Allergies  Family History  Problem Relation Age of Onset  . Diabetes Mother   . Breast cancer Neg Hx      Social History   Socioeconomic History  . Marital status: Single    Spouse name: Not on file  . Number of children: 3  . Years of education: 16  . Highest education level: Not on file  Occupational History  . Occupation: Glass blower/designer  Social Needs  . Financial resource strain: Not on file  . Food insecurity:    Worry: Not on file    Inability: Not on file  . Transportation needs:    Medical: Not on file    Non-medical: Not on file  Tobacco Use  . Smoking status: Never Smoker  . Smokeless tobacco: Never Used  Substance and Sexual Activity  . Alcohol use: No    Alcohol/week: 0.0 oz  . Drug use: No  . Sexual activity: Yes    Birth control/protection: None, Surgical  Lifestyle  . Physical activity:    Days per week: Not on file    Minutes per session: Not on file  . Stress: Not on file  Relationships  . Social connections:    Talks on phone: Not on file    Gets together: Not on file    Attends religious service: Not on file    Active member of club or organization: Not on file    Attends meetings of  clubs or organizations: Not on file    Relationship status: Not on file  . Intimate partner violence:    Fear of current or ex partner: Not on file    Emotionally abused: Not on file    Physically abused: Not on file    Forced sexual activity: Not on file  Other Topics Concern  . Not on file  Social History Narrative   Lives at home with her daughter and her mother.   Occasional caffeine use (coffee twice weekly).   Right-handed.     PHYSICAL EXAM:  VS: There were no vitals taken for this visit. Physical Exam Gen: NAD, alert, cooperative with exam, well-appearing ENT: normal lips, normal nasal mucosa,  Eye: normal EOM, normal conjunctiva and lids CV:  no edema, +2 pedal pulses   Resp: no accessory muscle use, non-labored,  GI: no masses or tenderness, no hernia  Skin: no rashes, no areas of induration  Neuro: normal tone, normal sensation to touch Psych:  normal insight, alert and oriented MSK:  ***      ASSESSMENT & PLAN:   No problem-specific Assessment & Plan notes found for  this encounter.

## 2017-07-30 ENCOUNTER — Encounter: Payer: Self-pay | Admitting: Family Medicine

## 2017-07-30 ENCOUNTER — Telehealth: Payer: Self-pay | Admitting: Family Medicine

## 2017-07-30 ENCOUNTER — Ambulatory Visit: Payer: 59 | Admitting: Family Medicine

## 2017-07-30 VITALS — BP 120/78 | HR 76 | Ht 61.0 in | Wt 158.4 lb

## 2017-07-30 DIAGNOSIS — H1013 Acute atopic conjunctivitis, bilateral: Secondary | ICD-10-CM | POA: Diagnosis not present

## 2017-07-30 DIAGNOSIS — E7800 Pure hypercholesterolemia, unspecified: Secondary | ICD-10-CM | POA: Diagnosis not present

## 2017-07-30 MED ORDER — KETOTIFEN FUMARATE 0.025 % OP SOLN: 1.0000 [drp] | Freq: Two times a day (BID) | OPHTHALMIC | 2 refills | Status: DC

## 2017-07-30 MED ORDER — OLOPATADINE HCL 0.1 % OP SOLN: 1.0000 [drp] | Freq: Two times a day (BID) | OPHTHALMIC | 3 refills | Status: DC

## 2017-07-30 NOTE — Patient Instructions (Signed)
Conjuntivitis alrgica (Allergic Conjunctivitis) Una membrana delgada y transparente (conjuntiva) cubre la parte blanca del ojo y la superficie interna del prpado. La conjuntivitis alrgica se produce cuando esta membrana se irrita, lo que es consecuencia de las [place]. Entre las cosas comunes (alrgenos) que pueden causar una reaccin alrgica, se incluyen las siguientes:  Polvo.  Polen.  Moho.  Animales: ? El pelo. ? El pelaje. ? La piel. ? La saliva u otros lquidos de los [place]. Esta afeccin puede hacer que los ojos tengan un color rojo o rosa. Tambin puede causar [employer] ojos. Esta afeccin no se transmite de [place] persona a la otra (no contagiosa). [place] o aplquese los medicamentos solamente como se lo haya indicado el mdico.  Evite tocarse o frotarse los ojos.  Aplquese un pao limpio y fro en el ojo durante 10a 56minutos, 3 o 4veces al [employer].  Si [place] lentes de contacto, no las use hasta que la irritacin se haya ido. Mientras tanto, use anteojos.  Evite usar [employer] ojos hasta que la irritacin se haya ido.  Trate de evitar el alrgeno que le est causando la Risk analyst.  SOLICITE AYUDA SI:  Los sntomas empeoran.  Le supura pus de los ojos.  Aparecen nuevos sntomas.  Tiene fiebre.  Esta informacin no tiene Marine scientist el consejo del mdico. Asegrese de hacerle al mdico cualquier pregunta que tenga. Document Released: 02/26/2011 Document Revised: 03/30/2014 Document Reviewed: 12/19/2013 Elsevier Interactive Patient Education  2017 Elsevier Inc.  Conjuntivitis alrgica (Allergic Conjunctivitis) Lilian Coma delgada y transparente (conjuntiva) cubre la parte blanca del ojo y la superficie interna del prpado. La conjuntivitis alrgica se produce cuando esta membrana se irrita, lo que es consecuencia de las [place]. Entre las cosas comunes (alrgenos) que pueden causar una reaccin alrgica, se  incluyen las siguientes:  Polvo.  Polen.  Moho.  Animales: ? El pelo. ? El pelaje. ? La piel. ? La saliva u otros lquidos de los [place]. Esta afeccin puede hacer que los ojos tengan un color rojo o rosa. Tambin puede causar [employer] ojos. Esta afeccin no se transmite de [place] persona a la otra (no contagiosa). [place] o aplquese los medicamentos solamente como se lo haya indicado el mdico.  Evite tocarse o frotarse los ojos.  Aplquese un pao limpio y fro en el ojo durante 10a 83minutos, 3 o 4veces al [employer].  Si [place] lentes de contacto, no las use hasta que la irritacin se haya ido. Mientras tanto, use anteojos.  Evite usar [employer] ojos hasta que la irritacin se haya ido.  Trate de evitar el alrgeno que le est causando la Risk analyst.  SOLICITE AYUDA SI:  Los sntomas empeoran.  Le supura pus de los ojos.  Aparecen nuevos sntomas.  Tiene fiebre.  Esta informacin no tiene Marine scientist el consejo del mdico. Asegrese de hacerle al mdico cualquier pregunta que tenga. Document Released: 02/26/2011 Document Revised: 03/30/2014 Document Reviewed: 12/19/2013 Elsevier Interactive Patient Education  2017 Elsevier Inc. Olopatadine eye solution What is this medicine? OLOPATADINE (oh loe pa TA deen) drops are used in the eye to treat symptoms caused by allergies. This medicine may be used for other purposes; ask your health care provider or pharmacist if you have questions. COMMON BRAND NAME(S): Pataday, Patanol, Pazeo What should I tell my health care provider before I take this medicine? They need to know if you have any of  these conditions: -wear contact lenses -an unusual or allergic reaction to olopatadine, other medicines, foods, dyes, or preservatives -pregnant or trying to get pregnant -breast-feeding How should I use this medicine? This medicine is only for use in the eye. Do not take by mouth. Follow  the directions on the prescription label. Wash hands before and after use. Tilt the head back slightly and pull down the lower lid with the index finger to form a pouch. Try not to touch the tip of the dropper to your eye, fingertips, or any other surface. Place the prescribed number of drops into the pouch. Close the eye gently to spread the drops. Your vision may blur for a few minutes. Use your doses at regular intervals. Do not use your medicine more often than directed. Talk to your pediatrician regarding the use of this medicine in children. While this drug may be prescribed for children as young as 54 years of age for selected conditions, precautions do apply. Overdosage: If you think you have taken too much of this medicine contact a poison control center or emergency room at once. NOTE: This medicine is only for you. Do not share this medicine with others. What if I miss a dose? If you miss a dose, use it as soon as you can. If it is almost time for your next dose, use only that dose. Do not use double or extra doses. What may interact with this medicine? Interactions are not expected. Do not use any other eye products without telling your doctor or health care professional. This list may not describe all possible interactions. Give your health care provider a list of all the medicines, herbs, non-prescription drugs, or dietary supplements you use. Also tell them if you smoke, drink alcohol, or use illegal drugs. Some items may interact with your medicine. What should I watch for while using this medicine? Tell your doctor or healthcare professional if your symptoms do not start to get better or if they get worse. Stop using this medicine if your eyes get swollen, painful, or have a discharge, and see your doctor or health care professional as soon as you can. Do not wear contact lenses if your eyes are red. This medicine should not be used to treat irritation that is caused by contact lenses.  Remove soft contact lenses before using this medicine. Contact lenses may be inserted 10 minutes after putting the medicine in the eye. What side effects may I notice from receiving this medicine? Side effects that you should report to your doctor or health care professional as soon as possible: -allergic reactions like skin rash, itching or hives, swelling of the face, lips, or tongue -eye pain, swelling, or increased redness Side effects that usually do not require medical attention (report to your doctor or health care professional if they continue or are bothersome): -burning, discomfort, stinging, or tearing immediately after use -dry eyes -headache -runny or stuffy nose This list may not describe all possible side effects. Call your doctor for medical advice about side effects. You may report side effects to FDA at 1-800-FDA-1088. Where should I keep my medicine? Keep out of reach of children. Store between 4 and 25 degrees C (39 and 77 degrees F). Keep container tightly closed when not in use. Protect from light. Throw away any unused medicine after the expiration date. NOTE: This sheet is a summary. It may not cover all possible information. If you have questions about this medicine, talk to your doctor, pharmacist, or  health care provider.  2018 Elsevier/Gold Standard (2015-11-06 15:45:49)

## 2017-07-30 NOTE — Progress Notes (Signed)
Subjective:  Patient ID: Natalie Martinez, female    DOB: June 07, 1965  Age: 52 y.o. MRN: [id number]  CC: Conjunctivitis   HPI Natalie Martinez presents for evaluation of itchy eyes. There has been no purulent discharge, am matting, eye pain or visual changes. Flonase has helped her sneezing, pnd and nasal congestion. Denies f/c, n/v, facial  Pressure or teeth pain. She is tolerating higher dose of simvastatin and taking it at night.   Outpatient Medications Prior to Visit  Medication Sig Dispense Refill  . cetirizine (ZYRTEC) 10 MG tablet Take 10 mg by mouth daily.    . cyclobenzaprine (FLEXERIL) 5 MG tablet Take 5 mg by mouth every 8 (eight) hours as needed for muscle spasms.    . fluticasone (FLONASE) 50 MCG/ACT nasal spray Place 2 sprays into both nostrils daily. 16 g 2  . simvastatin (ZOCOR) 40 MG tablet Take 1 tablet (40 mg total) by mouth at bedtime. 90 tablet 1   No facility-administered medications prior to visit.     ROS Review of Systems  Constitutional: Negative for chills, fever and unexpected weight change.  HENT: Negative for congestion, dental problem, ear pain, hearing loss, sinus pressure, sinus pain, sore throat, trouble swallowing and voice change.   Eyes: Positive for redness and itching. Negative for photophobia, pain, discharge and visual disturbance.  Respiratory: Negative.   Cardiovascular: Negative.   Gastrointestinal: Negative.   Genitourinary: Negative.   Musculoskeletal: Negative for arthralgias and myalgias.  Skin: Negative for color change and rash.  Allergic/Immunologic: Negative for immunocompromised state.  Neurological: Negative for weakness and headaches.  Hematological: Does not bruise/bleed easily.  Psychiatric/Behavioral: Negative.     Objective:  BP 120/78   Pulse 76   Ht 5\' 1"  (1.549 m)   Wt 158 lb 6 oz (71.8 kg)   SpO2 97%   BMI 29.92 kg/m   BP Readings from Last 3 Encounters:  07/30/17 120/78  06/21/17 126/80    12/18/16 112/64    Wt Readings from Last 3 Encounters:  07/30/17 158 lb 6 oz (71.8 kg)  06/21/17 157 lb (71.2 kg)  12/18/16 151 lb (68.5 kg)    Physical Exam  Constitutional: She is oriented to person, place, and time. She appears well-developed and well-nourished. No distress.  HENT:  Head: Normocephalic and atraumatic.  Right Ear: External ear normal.  Left Ear: External ear normal.  Mouth/Throat: Oropharynx is clear and moist. No oropharyngeal exudate.  Eyes: Pupils are equal, round, and reactive to light. Conjunctivae and EOM are normal. Right eye exhibits no discharge. Left eye exhibits no discharge. No scleral icterus.    Neck: Neck supple. No JVD present. No tracheal deviation present. No thyromegaly present.  Cardiovascular: Normal rate, regular rhythm and normal heart sounds.  Pulmonary/Chest: Effort normal and breath sounds normal.  Abdominal: Bowel sounds are normal.  Musculoskeletal: She exhibits no edema.  Lymphadenopathy:    She has no cervical adenopathy.  Neurological: She is alert and oriented to person, place, and time.  Skin: Skin is warm and dry. She is not diaphoretic.  Psychiatric: She has a normal mood and affect. Her behavior is normal.    Lab Results  Component Value Date   WBC 4.9 06/21/2017   HGB 13.7 06/21/2017   HCT 41.4 06/21/2017   PLT 253.0 06/21/2017   GLUCOSE 103 (H) 06/21/2017   CHOL 202 (H) 06/21/2017   TRIG 166.0 (H) 06/21/2017   HDL 43.70 06/21/2017   LDLCALC 125 (H) 06/21/2017   ALT  15 06/21/2017   AST 18 06/21/2017   NA 140 06/21/2017   K 4.3 06/21/2017   CL 101 06/21/2017   CREATININE 0.61 06/21/2017   BUN 14 06/21/2017   CO2 30 06/21/2017   TSH 1.70 06/21/2017    Dg Lumbar Spine Complete  Result Date: 03/08/2017 CLINICAL DATA:  Patient ports falling 2 months ago and has persistent mid to low back pain. EXAM: LUMBAR SPINE - COMPLETE 4+ VIEW COMPARISON:  Lumbar spine series of May 29, 2015 FINDINGS: The lumbar  vertebral bodies are preserved in height. The pedicles and transverse processes are intact. The disc space heights are well maintained. There is no spondylolisthesis. There is no significant facet joint hypertrophy. The observed portions of the sacrum exhibit no acute abnormalities. There are sclerotic changes associated with the iliac portions of the SI joints which are similar to those seen previously. IMPRESSION: No acute or significant chronic abnormality of the lumbar spine. Sclerosis of the iliac sides of the SI joints. Electronically Signed   By: David  Martinique M.D.   On: 03/08/2017 11:41   Dg Knee Complete 4 Views Left  Result Date: 03/08/2017 CLINICAL DATA:  Patient reports falling approximately 2 months ago and has persistent anterior left knee discomfort. EXAM: LEFT KNEE - COMPLETE 4+ VIEW COMPARISON:  None in PACs FINDINGS: The bones are subjectively adequately mineralized. There is no acute or old fracture nor dislocation. There is no joint effusion. The joint spaces are reasonably well-maintained. There is a sclerotic focus in following the endostium of the proximal right tibia medially. IMPRESSION: No acute bony abnormality of the knee is observed. No significant degenerative changes are observed. Sclerotic focus in the proximal shaft of the left tibia is likely a benign bone island but correlation with the site of the patient's symptoms is needed. If there are symptoms referrable to the upper tibia, MRI would be a useful next step to exclude occult malignancy or other acute tibial pathology. Electronically Signed   By: David  Martinique M.D.   On: 03/08/2017 11:39   Mm Digital Screening Bilateral  Result Date: 03/09/2017 CLINICAL DATA:  Screening. EXAM: DIGITAL SCREENING BILATERAL MAMMOGRAM WITH CAD COMPARISON:  Previous exam(s). ACR Breast Density Category c: The breast tissue is heterogeneously dense, which may obscure small masses. FINDINGS: There are no findings suspicious for malignancy.  Images were processed with CAD. IMPRESSION: No mammographic evidence of malignancy. A result letter of this screening mammogram will be mailed directly to the patient. RECOMMENDATION: Screening mammogram in one year. (Code:SM-B-01Y) BI-RADS CATEGORY  1: Negative. Electronically Signed   By: Franki Cabot M.D.   On: 03/09/2017 09:03    Assessment & Plan:   Tiki was seen today for conjunctivitis.  Diagnoses and all orders for this visit:  Acute atopic conjunctivitis of both eyes -     Discontinue: olopatadine (PATANOL) 0.1 % ophthalmic solution; Place 1 drop into both eyes 2 (two) times daily. -     ketotifen (ZADITOR) 0.025 % ophthalmic solution; Place 1 drop into both eyes 2 (two) times daily.  Hypercholesterolemia   I have discontinued Una L. Beverlin's olopatadine. I am also having her start on ketotifen. Additionally, I am having her maintain her cetirizine, cyclobenzaprine, fluticasone, and simvastatin.  Meds ordered this encounter  Medications  . DISCONTD: olopatadine (PATANOL) 0.1 % ophthalmic solution    Sig: Place 1 drop into both eyes 2 (two) times daily.    Dispense:  5 mL    Refill:  3  . ketotifen (  ZADITOR) 0.025 % ophthalmic solution    Sig: Place 1 drop into both eyes 2 (two) times daily.    Dispense:  5 mL    Refill:  2     Follow-up: Return in about 1 week (around 08/06/2017), or if symptoms worsen or fail to improve.  Libby Maw, MD

## 2017-07-30 NOTE — Telephone Encounter (Signed)
New Rx sent in & patient is aware.

## 2017-07-30 NOTE — Telephone Encounter (Signed)
Copied from [place] [id number]. Topic: Quick Communication - Rx Refill/Question >> Jul 30, 2017  4:27 PM Waylan Rocher, Lumin L wrote: Medication: olopatadine (PATANOL) 0.1 % ophthalmic solution  Has the patient contacted their pharmacy? Yes.   (Agent: If no, request that the patient contact the pharmacy for the refill.) Preferred Pharmacy (with phone number or street name): [facility] [phone] [address], [place] [address] Phone: [phone] Fax: [phone] Agent: Please be advised that RX refills may take up to 3 business days. We ask that you follow-up with your pharmacy.  Medication is $72 which is too expensive. Please send an alternate and call patient once it's sent.

## 2017-09-07 ENCOUNTER — Encounter: Payer: Self-pay | Admitting: Obstetrics & Gynecology

## 2017-09-07 ENCOUNTER — Ambulatory Visit (INDEPENDENT_AMBULATORY_CARE_PROVIDER_SITE_OTHER): Payer: 59 | Admitting: Obstetrics & Gynecology

## 2017-09-07 VITALS — BP 122/84 | Ht 60.5 in | Wt 158.0 lb

## 2017-09-07 DIAGNOSIS — N890 Mild vaginal dysplasia: Secondary | ICD-10-CM | POA: Diagnosis not present

## 2017-09-07 DIAGNOSIS — Z90710 Acquired absence of both cervix and uterus: Secondary | ICD-10-CM | POA: Diagnosis not present

## 2017-09-07 DIAGNOSIS — Z01419 Encounter for gynecological examination (general) (routine) without abnormal findings: Secondary | ICD-10-CM

## 2017-09-07 DIAGNOSIS — Z113 Encounter for screening for infections with a predominantly sexual mode of transmission: Secondary | ICD-10-CM | POA: Diagnosis not present

## 2017-09-07 DIAGNOSIS — Z1151 Encounter for screening for human papillomavirus (HPV): Secondary | ICD-10-CM | POA: Diagnosis not present

## 2017-09-07 NOTE — Progress Notes (Signed)
Natalie Martinez 12-14-1965 [id number]   History:    52 y.o. G3P3L3 Single  RP:  Established patient presenting for annual gyn exam   HPI: S/P Total Hysterectomy for Dysplasia, no residual on patho.  No menopausal Sx.  Broke up with boyfriend after 5 yrs.  Would like STI screen.  No pelvic pain.  Normal vaginal secretions.  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   Breasts wnl. BMI 30.35 on a low Calorie/low Carb diet and increased physical activity.  Health labs with Fam MD.  Past medical history,surgical history, family history and social history were all reviewed and documented in the EPIC chart.  Gynecologic History No LMP recorded. Patient has had a hysterectomy. Contraception: status post hysterectomy Last Pap: 01/2015. Results were: Negative/HPV HR neg Last mammogram: 02/2017. Results were: Negative Bone Density: Never Colonoscopy: Never  Obstetric History OB History  Gravida Para Term Preterm AB Living  3 3 3     3   SAB TAB Ectopic Multiple Live Births          3    # Outcome Date GA Lbr Len/2nd Weight Sex Delivery Anes PTL Lv  3 Term     F Vag-Spont  N LIV  2 Term     F Vag-Spont  N DEC  1 Term     M Vag-Spont  N LIV     ROS: A ROS was performed and pertinent positives and negatives are included in the history.  GENERAL: No fevers or chills. HEENT: No change in vision, no earache, sore throat or sinus congestion. NECK: No pain or stiffness. CARDIOVASCULAR: No chest pain or pressure. No palpitations. PULMONARY: No shortness of breath, cough or wheeze. GASTROINTESTINAL: No abdominal pain, nausea, vomiting or diarrhea, melena or bright red blood per rectum. GENITOURINARY: No urinary frequency, urgency, hesitancy or dysuria. MUSCULOSKELETAL: No joint or muscle pain, no back pain, no recent trauma. DERMATOLOGIC: No rash, no itching, no lesions. ENDOCRINE: No polyuria, polydipsia, no heat or cold intolerance. No recent change in weight. HEMATOLOGICAL: No  anemia or easy bruising or bleeding. NEUROLOGIC: No headache, seizures, numbness, tingling or weakness. PSYCHIATRIC: No depression, no loss of interest in normal activity or change in sleep pattern.     Exam:   BP 122/84   Ht 5' 0.5" (1.537 m)   Wt 158 lb (71.7 kg)   BMI 30.35 kg/m   Body mass index is 30.35 kg/m.  General appearance : Well developed well nourished female. No acute distress HEENT: Eyes: no retinal hemorrhage or exudates,  Neck supple, trachea midline, no carotid bruits, no thyroidmegaly Lungs: Clear to auscultation, no rhonchi or wheezes, or rib retractions  Heart: Regular rate and rhythm, no murmurs or gallops Breast:Examined in sitting and supine position were symmetrical in appearance, no palpable masses or tenderness,  no skin retraction, no nipple inversion, no nipple discharge, no skin discoloration, no axillary or supraclavicular lymphadenopathy Abdomen: no palpable masses or tenderness, no rebound or guarding Extremities: no edema or skin discoloration or tenderness  Pelvic: Vulva: Normal             Vagina: No gross lesions or discharge.  Pap/HPV HR done, Gono-Chlam.  Cervix/Uterus absent  Adnexa  Without masses or tenderness  Anus: Normal   Assessment/Plan:  52 y.o. female for annual exam   1. Well female exam with routine gynecological exam Gynecologic exam status post total hysterectomy.  Pap with high-risk HPV done on the vaginal vault.  Breast  exam normal.  Last screening mammogram was negative in December 2018.  Health labs with family physician.  Will start screening colonoscopy at age 36.  Repeat Pap with high-risk HPV done on the vaginal vault today.  2. History of total hysterectomy  3. Screen for STD (sexually transmitted disease) Strict condom use recommended - HIV antibody (with reflex) - RPR - Hepatitis C Antibody - Hepatitis B Surface AntiGEN  4. VAIN I (vaginal intraepithelial neoplasia grade I) Had CO2 Laser of Vaginal vault in  03/2011.  Repeat Pap with high-risk HPV done on the vaginal vault today.  Princess Bruins MD, 4:08 PM 09/07/2017

## 2017-09-08 LAB — HEPATITIS C ANTIBODY
Hepatitis C Ab: NONREACTIVE
SIGNAL TO CUT-OFF: 0.03 (ref <1.00)

## 2017-09-08 LAB — RPR: RPR Ser Ql: NONREACTIVE

## 2017-09-08 LAB — HEPATITIS B SURFACE ANTIGEN: Hepatitis B Surface Ag: NONREACTIVE

## 2017-09-08 LAB — HIV ANTIBODY (ROUTINE TESTING W REFLEX): HIV 1&2 Ab, 4th Generation: NONREACTIVE

## 2017-09-09 LAB — PAP IG, CT-NG NAA, HPV HIGH-RISK
C. trachomatis RNA, TMA: NOT DETECTED
HPV DNA High Risk: NOT DETECTED
N. gonorrhoeae RNA, TMA: NOT DETECTED

## 2017-09-12 ENCOUNTER — Encounter: Payer: Self-pay | Admitting: Obstetrics & Gynecology

## 2017-09-12 NOTE — Patient Instructions (Signed)
1. Well female exam with routine gynecological exam Gynecologic exam status post total hysterectomy.  Pap with high-risk HPV done on the vaginal vault.  Breast exam normal.  Last screening mammogram was negative in December 2018.  Health labs with family physician.  Will start screening colonoscopy at age 52.  Repeat Pap with high-risk HPV done on the vaginal vault today.  2. History of total hysterectomy  3. Screen for STD (sexually transmitted disease) Strict condom use recommended - HIV antibody (with reflex) - RPR - Hepatitis C Antibody - Hepatitis B Surface AntiGEN  4. VAIN I (vaginal intraepithelial neoplasia grade I) Had CO2 Laser of Vaginal vault in 03/2011.  Repeat Pap with high-risk HPV done on the vaginal vault today.  Hassan Rowan, fue un placer encontrarle hoy!  Voy a informarle de sus [employer].

## 2017-09-21 ENCOUNTER — Encounter

## 2017-09-27 ENCOUNTER — Inpatient Hospital Stay: Admit: 2017-09-27 | Payer: PRIVATE HEALTH INSURANCE | Attending: Obstetrics & Gynecology | Primary: Internal Medicine

## 2017-09-27 DIAGNOSIS — Z1231 Encounter for screening mammogram for malignant neoplasm of breast: Secondary | ICD-10-CM

## 2017-11-02 ENCOUNTER — Ambulatory Visit (INDEPENDENT_AMBULATORY_CARE_PROVIDER_SITE_OTHER): Payer: 59

## 2017-11-02 ENCOUNTER — Encounter: Payer: Self-pay | Admitting: Family Medicine

## 2017-11-02 ENCOUNTER — Ambulatory Visit: Payer: 59 | Admitting: Family Medicine

## 2017-11-02 VITALS — BP 128/76 | HR 74 | Ht 60.5 in | Wt 159.0 lb

## 2017-11-02 DIAGNOSIS — H00024 Hordeolum internum left upper eyelid: Secondary | ICD-10-CM

## 2017-11-02 DIAGNOSIS — L811 Chloasma: Secondary | ICD-10-CM | POA: Diagnosis not present

## 2017-11-02 DIAGNOSIS — E6609 Other obesity due to excess calories: Secondary | ICD-10-CM

## 2017-11-02 DIAGNOSIS — M461 Sacroiliitis, not elsewhere classified: Secondary | ICD-10-CM

## 2017-11-02 DIAGNOSIS — M1280 Other specific arthropathies, not elsewhere classified, unspecified site: Secondary | ICD-10-CM

## 2017-11-02 DIAGNOSIS — Z6830 Body mass index (BMI) 30.0-30.9, adult: Secondary | ICD-10-CM

## 2017-11-02 MED ORDER — METHOCARBAMOL 500 MG PO TABS: 500.0000 mg | ORAL_TABLET | Freq: Three times a day (TID) | ORAL | 0 refills | Status: AC | PRN

## 2017-11-02 MED ORDER — MELOXICAM 15 MG PO TABS: 15.0000 mg | ORAL_TABLET | Freq: Every day | ORAL | 0 refills | Status: DC

## 2017-11-02 MED ORDER — HYDROQUINONE 4 % EX CREA: TOPICAL_CREAM | CUTANEOUS | 0 refills | Status: DC

## 2017-11-02 MED ORDER — KETOROLAC TROMETHAMINE 60 MG/2ML IM SOLN
60.0000 mg | Freq: Once | INTRAMUSCULAR | Status: AC
  Administered 2017-11-02: 60 mg via INTRAMUSCULAR

## 2017-11-02 MED ORDER — DOXYCYCLINE HYCLATE 100 MG PO TABS: 100.0000 mg | ORAL_TABLET | Freq: Two times a day (BID) | ORAL | 0 refills | Status: DC

## 2017-11-02 NOTE — Progress Notes (Addendum)
Subjective:  Patient ID: Natalie Martinez, female    DOB: 1965/11/01  Age: 52 y.o. MRN: [id number]  CC: Back Pain   HPI Natalie Martinez presents for evaluation of a 3-day history of pain in her lower back that is mostly on the left lower back area.  No injury.  She has a Glass blower/designer in a plant.  She has had problems with this area in the past review of the chart shows that she does have a history of sacroiliitis.  Lumbar spine films were negative.  She has tried no medicines for this.  She also has a tender bump in her left upper eyelid.  There is been no drainage or change in her vision.  She requests a refill of her hydroquinone cream for an area of hyperpigmentation on her left arm.  This is helped in the past.  Outpatient Medications Prior to Visit  Medication Sig Dispense Refill  . simvastatin (ZOCOR) 40 MG tablet Take 1 tablet (40 mg total) by mouth at bedtime. 90 tablet 1   No facility-administered medications prior to visit.     ROS Review of Systems  Constitutional: Negative for chills, fatigue, fever and unexpected weight change.  HENT: Negative.   Eyes: Negative for photophobia, pain, redness and visual disturbance.  Respiratory: Negative.   Cardiovascular: Negative.   Gastrointestinal: Negative.   Musculoskeletal: Positive for back pain. Negative for gait problem, joint swelling and myalgias.  Skin: Positive for color change and rash.  Allergic/Immunologic: Negative for immunocompromised state.  Neurological: Negative for weakness and numbness.  Hematological: Does not bruise/bleed easily.  Psychiatric/Behavioral: Negative.     Objective:  BP 128/76   Pulse 74   Ht 5' 0.5" (1.537 m)   Wt 159 lb (72.1 kg)   SpO2 97%   BMI 30.54 kg/m   BP Readings from Last 3 Encounters:  11/02/17 128/76  09/07/17 122/84  07/30/17 120/78    Wt Readings from Last 3 Encounters:  11/02/17 159 lb (72.1 kg)  09/07/17 158 lb (71.7 kg)  07/30/17 158 lb 6 oz  (71.8 kg)    Physical Exam  Constitutional: She is oriented to person, place, and time. She appears well-developed and well-nourished. No distress.  HENT:  Head: Normocephalic and atraumatic.  Right Ear: External ear normal.  Left Ear: External ear normal.  Eyes: Pupils are equal, round, and reactive to light. Conjunctivae and EOM are normal. Right eye exhibits no discharge. Left eye exhibits no discharge. No scleral icterus.    Neck: Normal range of motion. Neck supple. No JVD present. No tracheal deviation present. No thyromegaly present.  Pulmonary/Chest: Effort normal.  Musculoskeletal:       Lumbar back: She exhibits normal range of motion, no tenderness, no bony tenderness, no swelling and no spasm.       Back:  Neurological: She is alert and oriented to person, place, and time.  Skin: Skin is warm and dry. Rash noted. She is not diaphoretic. No erythema. No pallor.     Psychiatric: She has a normal mood and affect. Her behavior is normal.    Lab Results  Component Value Date   WBC 4.9 06/21/2017   HGB 13.7 06/21/2017   HCT 41.4 06/21/2017   PLT 253.0 06/21/2017   GLUCOSE 103 (H) 06/21/2017   CHOL 202 (H) 06/21/2017   TRIG 166.0 (H) 06/21/2017   HDL 43.70 06/21/2017   LDLCALC 125 (H) 06/21/2017   ALT 15 06/21/2017   AST 18 06/21/2017  NA 140 06/21/2017   K 4.3 06/21/2017   CL 101 06/21/2017   CREATININE 0.61 06/21/2017   BUN 14 06/21/2017   CO2 30 06/21/2017   TSH 1.70 06/21/2017    Dg Lumbar Spine Complete  Result Date: 03/08/2017 CLINICAL DATA:  Patient ports falling 2 months ago and has persistent mid to low back pain. EXAM: LUMBAR SPINE - COMPLETE 4+ VIEW COMPARISON:  Lumbar spine series of May 29, 2015 FINDINGS: The lumbar vertebral bodies are preserved in height. The pedicles and transverse processes are intact. The disc space heights are well maintained. There is no spondylolisthesis. There is no significant facet joint hypertrophy. The observed  portions of the sacrum exhibit no acute abnormalities. There are sclerotic changes associated with the iliac portions of the SI joints which are similar to those seen previously. IMPRESSION: No acute or significant chronic abnormality of the lumbar spine. Sclerosis of the iliac sides of the SI joints. Electronically Signed   By: David  Martinique M.D.   On: 03/08/2017 11:41   Dg Knee Complete 4 Views Left  Result Date: 03/08/2017 CLINICAL DATA:  Patient reports falling approximately 2 months ago and has persistent anterior left knee discomfort. EXAM: LEFT KNEE - COMPLETE 4+ VIEW COMPARISON:  None in PACs FINDINGS: The bones are subjectively adequately mineralized. There is no acute or old fracture nor dislocation. There is no joint effusion. The joint spaces are reasonably well-maintained. There is a sclerotic focus in following the endostium of the proximal right tibia medially. IMPRESSION: No acute bony abnormality of the knee is observed. No significant degenerative changes are observed. Sclerotic focus in the proximal shaft of the left tibia is likely a benign bone island but correlation with the site of the patient's symptoms is needed. If there are symptoms referrable to the upper tibia, MRI would be a useful next step to exclude occult malignancy or other acute tibial pathology. Electronically Signed   By: David  Martinique M.D.   On: 03/08/2017 11:39   Mm Digital Screening Bilateral  Result Date: 03/09/2017 CLINICAL DATA:  Screening. EXAM: DIGITAL SCREENING BILATERAL MAMMOGRAM WITH CAD COMPARISON:  Previous exam(s). ACR Breast Density Category c: The breast tissue is heterogeneously dense, which may obscure small masses. FINDINGS: There are no findings suspicious for malignancy. Images were processed with CAD. IMPRESSION: No mammographic evidence of malignancy. A result letter of this screening mammogram will be mailed directly to the patient. RECOMMENDATION: Screening mammogram in one year.  (Code:SM-B-01Y) BI-RADS CATEGORY  1: Negative. Electronically Signed   By: Franki Cabot M.D.   On: 03/09/2017 09:03    Assessment & Plan:   Deannie was seen today for back pain.  Diagnoses and all orders for this visit:  Hordeolum internum of left upper eyelid -     doxycycline (VIBRA-TABS) 100 MG tablet; Take 1 tablet (100 mg total) by mouth 2 (two) times daily.  Bilateral sacroiliitis (HCC) -     DG Si Joints; Future -     meloxicam (MOBIC) 15 MG tablet; Take 1 tablet (15 mg total) by mouth daily. For 10 days and then as needed. -     methocarbamol (ROBAXIN) 500 MG tablet; Take 1 tablet (500 mg total) by mouth every 8 (eight) hours as needed for up to 10 days for muscle spasms. -     ketorolac (TORADOL) injection 60 mg -     HLA-B27 antigen -     Ambulatory referral to Rheumatology -     Sedimentation rate; Future -  C-reactive protein; Future  Idiopathic chloasma -     hydroquinone 4 % cream; Apply daily to affected area for  one month  Class 1 obesity due to excess calories without serious comorbidity with body mass index (BMI) of 30.0 to 30.9 in adult  HLA-B27 positive arthropathy -     Ambulatory referral to Rheumatology -     Sedimentation rate; Future -     C-reactive protein; Future  Other orders -     TIQ-NTM   I am having Johneisha L. Shubert start on meloxicam, methocarbamol, doxycycline, and hydroquinone. I am also having her maintain her simvastatin. We administered ketorolac.  Meds ordered this encounter  Medications  . meloxicam (MOBIC) 15 MG tablet    Sig: Take 1 tablet (15 mg total) by mouth daily. For 10 days and then as needed.    Dispense:  30 tablet    Refill:  0  . methocarbamol (ROBAXIN) 500 MG tablet    Sig: Take 1 tablet (500 mg total) by mouth every 8 (eight) hours as needed for up to 10 days for muscle spasms.    Dispense:  30 tablet    Refill:  0  . doxycycline (VIBRA-TABS) 100 MG tablet    Sig: Take 1 tablet (100 mg total) by mouth 2  (two) times daily.    Dispense:  20 tablet    Refill:  0  . hydroquinone 4 % cream    Sig: Apply daily to affected area for  one month    Dispense:  28.35 g    Refill:  0  . ketorolac (TORADOL) injection 60 mg   Patient is interested in losing some weight and she was given information on exercising and losing weight.  With her sacroiliitis we will check a HL AB 27 antigen.  Will use doxycycline or her internalized hordeolum.  Did not see a conjunctival pustule.  Sun precautions were stressed on the doxycycline.  Recheck in 2 weeks.  Follow-up: Return in about 2 weeks (around 11/16/2017), or if symptoms worsen or fail to improve.  Libby Maw, MD

## 2017-11-02 NOTE — Patient Instructions (Signed)
Hacer ejercicio para bajar de peso  (Exercising to Lose Weight)  Hacer ejercicio puede ayudarlo a bajar de peso. Para bajar de peso mediante el ejercicio, este debe ser de intensidad vigorosa. Puede saber que está haciendo ejercicio de intensidad vigorosa si respira con mucha dificultad y rapidez, y no puede mantener una conversación.  El ejercicio de intensidad moderada ayuda a mantener el peso actual. Puede saber que está haciendo ejercicio de intensidad moderada si tiene una frecuencia cardíaca más elevada y una respiración más rápida, pero aún puede mantener una conversación.  ¿CON QUÉ FRECUENCIA DEBO HACER EJERCICIO?  Elija una actividad que disfrute y establezca objetivos realistas. El médico puede ayudarlo a elaborar un plan de actividades que funcione para usted. Haga ejercicio regularmente como se lo haya indicado el médico. Esta puede incluir:  · Realizar entrenamiento de resistencia dos veces por semana, como:  ? Flexiones de brazos.  ? Abdominales.  ? Levantamiento de pesas.  ? Ejercicios con bandas elásticas.  · Realizar una intensidad determinada de ejercicio durante una cantidad determinada de tiempo. Elija entre estas opciones:  ? 150 minutos de ejercicio de intensidad moderada cada semana.  ? 75 minutos de ejercicio de intensidad vigorosa cada semana.  ? Una mezcla de ejercicio de intensidad moderada y vigorosa cada semana.  Los niños, las mujeres embarazadas, las personas que no están en forma, las personas con sobrepeso y los adultos mayores tal vez tengan que consultar a un médico para que les dé recomendaciones individuales. Si tiene alguna enfermedad, asegúrese de consultar al médico antes de comenzar un programa de ejercicios nuevo.  ¿CUÁLES SON ALGUNAS ACTIVIDADES QUE PUEDEN AYUDARME A BAJAR DE PESO?  · Caminar a un ritmo de al menos 4,5 millas (7 kilómetros) por hora.  · Trotar o correr a un ritmo de 5 millas (8 kilómetros) por hora.   · Andar en bicicleta a un ritmo de al menos 10 millas (16 kilómetros) por hora.  · Practicar natación.  · Practicar patinaje sobre ruedas normales o en línea.  · Hacer esquí de fondo.  · Hacer deportes competitivos vigorosos, como fútbol americano, básquet y fútbol.  · Saltar la soga.  · Tomar clases de baile aeróbico.  ¿CÓMO PUEDO SER MÁS ACTIVO EN MIS ACTIVIDADES DIARIAS?  · Utilice las escaleras en lugar del ascensor.  · Dé una caminata durante su hora de almuerzo.  · Si conduce, estacione el automóvil más lejos del trabajo o de la escuela.  · Si usa transporte público, bájese una parada antes y camine el resto del camino.  · Póngase de pie y camine cada vez que haga llamadas telefónicas.  · Levántese, estírese y camine cada 30 minutos a lo largo del día.  ¿QUÉ PAUTAS DEBO SEGUIR MIENTRAS HAGO EJERCICIO?  · No haga ejercicio en exceso que pudiera hacer que se lastime, se sienta mareado o tenga dificultad para respirar.  · Consulte al médico antes de comenzar un programa de ejercicios nuevo.  · Use ropa cómoda y calzado con buen soporte.  · Beba gran cantidad de agua mientras hace ejercicios para evitar la deshidratación o los golpes de calor. Durante la actividad física se pierde agua corporal que se debe reponer.  · Haga ejercicio hasta que se acelere su respiración y sus latidos cardíacos.  Esta información no tiene como fin reemplazar el consejo del médico. Asegúrese de hacerle al médico cualquier pregunta que tenga.  Document Released: 06/13/2010 Document Revised: 03/30/2014 Document Reviewed: 08/10/2013  Elsevier Interactive Patient Education © 2018 [employer]

## 2017-11-03 ENCOUNTER — Telehealth: Payer: Self-pay

## 2017-11-03 LAB — TIQ-NTM

## 2017-11-03 LAB — HLA-B27 ANTIGEN: HLA-B27 ANTIGEN: POSITIVE — AB

## 2017-11-03 NOTE — Telephone Encounter (Signed)
TIQ-NTM for HLA-B27 antigen lab drawn on 11/02/17. Spoke with customer service rep and explained that our lab did not have recommended tube and therefore sent ACD-sol.A tube and additional 2 lavendar tubes for test so that test could be ran from either specimens.  -DMG

## 2017-11-04 ENCOUNTER — Telehealth: Payer: Self-pay | Admitting: Family Medicine

## 2017-11-04 DIAGNOSIS — M1280 Other specific arthropathies, not elsewhere classified, unspecified site: Secondary | ICD-10-CM | POA: Insufficient documentation

## 2017-11-04 NOTE — Addendum Note (Signed)
Addended by: Jon Billings on: 11/04/2017 08:54 AM   Modules accepted: Orders

## 2017-11-04 NOTE — Telephone Encounter (Signed)
I spoke with Dr. Ethelene Hal. Per provider, it is okay for patient to arrive late. I called and spoke with patient and made her aware. Nothing further needed.

## 2017-11-04 NOTE — Telephone Encounter (Signed)
Pt given results per Dr Ethelene Hal, "Would like for patient to see me about her blood work. I have ordered additional bw and referred her to a specialist. I think that I have an answer for the pain that she has been having."; the pt verbalizes understanding; the pt requests an appointment after 1600 because she gets off work at 1600; Dr Ethelene Hal has no appointments within the time that the pt requests; she also schedules an appointment 11/09/17 at 1600; the pt states that she gets off at 1600 and is 5 minutes away from office; spoke with Al Corpus, regarding grace period before an appointment is cancelled; Sharyn Lull request that this information be sent to Dr Bebe Shaggy assistant, Judson Roch Self; will route this information to her; please call pt back;  also the pt can be contacted at [phone].

## 2017-11-09 ENCOUNTER — Ambulatory Visit: Payer: 59 | Admitting: Family Medicine

## 2017-11-11 ENCOUNTER — Ambulatory Visit: Payer: 59 | Admitting: Family Medicine

## 2017-11-11 ENCOUNTER — Encounter: Payer: Self-pay | Admitting: Family Medicine

## 2017-11-11 VITALS — BP 128/80 | HR 76 | Ht 60.5 in | Wt 159.0 lb

## 2017-11-11 DIAGNOSIS — M461 Sacroiliitis, not elsewhere classified: Secondary | ICD-10-CM | POA: Diagnosis not present

## 2017-11-11 DIAGNOSIS — M1280 Other specific arthropathies, not elsewhere classified, unspecified site: Secondary | ICD-10-CM

## 2017-11-11 DIAGNOSIS — Z6830 Body mass index (BMI) 30.0-30.9, adult: Secondary | ICD-10-CM

## 2017-11-11 DIAGNOSIS — E6609 Other obesity due to excess calories: Secondary | ICD-10-CM

## 2017-11-11 DIAGNOSIS — H00024 Hordeolum internum left upper eyelid: Secondary | ICD-10-CM

## 2017-11-11 MED ORDER — PHENTERMINE HCL 37.5 MG PO CAPS: 37.5000 mg | ORAL_CAPSULE | ORAL | 1 refills | Status: DC

## 2017-11-11 NOTE — Patient Instructions (Signed)
Exercising to Lose Weight Exercising can help you to lose weight. In order to lose weight through exercise, you need to do vigorous-intensity exercise. You can tell that you are exercising with vigorous intensity if you are breathing very hard and fast and cannot hold a conversation while exercising. Moderate-intensity exercise helps to maintain your current weight. You can tell that you are exercising at a moderate level if you have a higher heart rate and faster breathing, but you are still able to hold a conversation. How often should I exercise? Choose an activity that you enjoy and set realistic goals. Your health care provider can help you to make an activity plan that works for you. Exercise regularly as directed by your health care provider. This may include:  Doing resistance training twice each week, such as: ? Push-ups. ? Sit-ups. ? Lifting weights. ? Using resistance bands.  Doing a given intensity of exercise for a given amount of time. Choose from these options: ? 150 minutes of moderate-intensity exercise every week. ? 75 minutes of vigorous-intensity exercise every week. ? A mix of moderate-intensity and vigorous-intensity exercise every week.  Children, pregnant women, people who are out of shape, people who are overweight, and older adults may need to consult a health care provider for individual recommendations. If you have any sort of medical condition, be sure to consult your health care provider before starting a new exercise program. What are some activities that can help me to lose weight?  Walking at a rate of at least 4.5 miles an hour.  Jogging or running at a rate of 5 miles per hour.  Biking at a rate of at least 10 miles per hour.  Lap swimming.  Roller-skating or in-line skating.  Cross-country skiing.  Vigorous competitive sports, such as football, basketball, and soccer.  Jumping rope.  Aerobic dancing. How can I be more active in my day-to-day  activities?  Use the stairs instead of the elevator.  Take a walk during your lunch break.  If you drive, park your car farther away from work or school.  If you take public transportation, get off one stop early and walk the rest of the way.  Make all of your phone calls while standing up and walking around.  Get up, stretch, and walk around every 30 minutes throughout the day. What guidelines should I follow while exercising?  Do not exercise so much that you hurt yourself, feel dizzy, or get very short of breath.  Consult your health care provider prior to starting a new exercise program.  Wear comfortable clothes and shoes with good support.  Drink plenty of water while you exercise to prevent dehydration or heat stroke. Body water is lost during exercise and must be replaced.  Work out until you breathe faster and your heart beats faster. This information is not intended to replace advice given to you by your health care provider. Make sure you discuss any questions you have with your health care provider. Document Released: 04/11/2010 Document Revised: 08/15/2015 Document Reviewed: 08/10/2013 Elsevier Interactive Patient Education  2018 [employer]. Phentermine tablets or capsules Qu es este medicamento? La FENTERMINA reduce su apetito. Si la combina con una dieta de bajas caloras y ejercio puede ayudarle a Sports coach de [place]. Este medicamento puede ser utilizado para otros usos; si tiene alguna pregunta consulte con su proveedor de atencin mdica o con su farmacutico. MARCAS COMUNES: Adipex-P, Atti-Plex P, Atti-Plex P Spansule, Fastin, Lomaira, Pro-Fast, Tara-8 Qu le debo informar a  mi profesional de la salud antes de tomar este medicamento? Necesita saber si usted presenta alguno de los siguientes problemas o situaciones: -agitacin -glaucoma -enfermedad cardiaca -alta presin sangunea -antecedentes de abuso de substancias -enfermedad pulmonar llamada Hipertensin  Pulmonar Primaria -si ha tomado un IMAO, tales como Carbex, Eldepryl, Marplan, Nardil o Parnate en los ltimos [place] reaccin alrgica o inusual a la fentermina, a otros medicamentos, alimentos, colorantes o conservantes -si est embarazada o buscando quedar embarazada -si est amamantando a un beb Cmo debo utilizar este medicamento? Tome este medicamento por va oral con un vaso de agua. Siga las instrucciones de la etiqueta del [place]. Las instrucciones de uso pueden variar segn el producto y la dosis que est tomando. Evite tomar este medicamento por la tarde antes de acostarse a dormir. Puede interferir con la conciliacin del sueo. Tome sus dosis a intervalos regulares. No tome su medicamento con una frecuencia mayor a la indicada. Hable con su pediatra para informarse acerca del uso de este medicamento en nios. Aunque este medicamento se puede recetar a nios a Proofreader de los 30 aos de edad en casos selectos, existen precauciones que deben cumplirse. Sobredosis: Pngase en contacto inmediatamente con un centro toxicolgico o una sala de urgencia si usted cree que haya tomado demasiado medicamento. ATENCIN: [employer] es solo para usted. No comparta este medicamento con nadie. Qu sucede si me olvido de una dosis? Si olvida una dosis, tmela lo antes posible. Si es casi la hora de la prxima dosis, tome slo esa dosis. No tome dosis adicionales o dobles. Qu puede interactuar con este medicamento? No tome esta medicina con ninguno de los siguientes medicamentos: -duloxetina -IMAOs, tales como Carbex, Eldepryl, Marplan, Nardil y Parnate -medicamentos para resfros o problemas respiratorios, tales como seudoefedrina o fenilefrina -procarbazina -sibutramine -ISRS, tales como citalopram, escitalopram, fluoxetina, fluvoxamina, paroxetina y sertralina -estimulantes, tales como dexmetilfenidato, metilfenidato o modafinil -venlafaxina Esta medicina  tambin puede interactuar con los siguientes medicamentos: -medicamentos para la diabetes Puede ser que esta lista no menciona todas las posibles interacciones. Informe a su profesional de KB Home	[place] de [employer] Corporation productos a base de hierbas, medicamentos de [place] o suplementos nutritivos que est tomando. Si usted fuma, consume bebidas alcohlicas o si utiliza drogas ilegales, indqueselo tambin a su profesional de KB Home	[place]. Algunas sustancias pueden interactuar con su medicamento. A qu debo estar atento al usar Coca-Cola? Informe a su mdico de inmediato si siente que le falta el aliento mientras realiza sus actividades habituales. No tome este medicamento dentro de 6 horas de la hora de [place]. Puede impedirle dormir. Evite las bebidas que contienen cafena y trata de Theatre manager un horario de acostarse habitual cada noche. Este medicamento fue diseado para ser utilizado en combinacin con una dieta saludable y ejercicio. De esta manera se obtienen los Levi Strauss. Este medicamento slo est indicado para uso a Control and instrumentation engineer. Con el tiempo, su peso puede estabilizarse. A partir de [employer], el medicamento slo le ayudar a Theatre manager su [employer] Corporation. No aumente ni modifique su dosis de ninguna manera sin consultar a su mdico. Puede experimentar mareos o somnolencia. No conduzca ni utilice maquinaria ni haga nada que Associate Professor en estado de alerta hasta que sepa cmo le afecta este medicamento. No se siente ni se ponga de pie con rapidez, especialmente si es un paciente de edad avanzada. Esto reduce el riesgo de mareos o [employer]. El alcohol puede aumentar los mareos y la somnolencia. Evite consumir  bebidas alcohlicas. Qu efectos secundarios puedo tener al [employer] Corporation este medicamento? Efectos secundarios que debe informar a su mdico o a Barrister's clerk de la salud tan pronto como sea posible: -Tourist information centre manager, palpitaciones -depresin o cambios severos de estados de  nimo -aumento de la presin sangunea -irritabilidad -nerviosismo o inquietud -mareos severos -falta de aliento -problemas para orinar -hinchazn inusual de las piernas -vmito Efectos secundarios que, por lo general, no requieren Geophysical data processor (debe informarlos a su mdico o a su profesional de la salud si persisten o si son molestos): -visin borrosa u otros problemas de ojo -cambios en la capacidad o el deseo sexual -estreimiento o diarrea -dificultad para conciliar el sueo -boca seca o sabor desagradable -dolor de cabeza -nuseas Puede ser que esta lista no menciona todos los posibles efectos secundarios. Comunquese a su mdico por asesoramiento mdico Humana Inc. Usted puede informar los efectos secundarios a la FDA por telfono al 1-800-FDA-1088. Dnde debo guardar mi medicina? Mantenga fuera del alcance de los nios. Existe la posibilidad de abusar de Coca-Cola. Mantenga su medicamento en un lugar seguro para protegerlo contra robos. No comparta este medicamento con nadie. Es [place], y est prohibido por la ley. Este medicamento puede causar Ardelia Mems sobredosis accidental y la muerte si lo toman otros adultos, nios o Copy. Mezcle todo el medicamento sin usar con una sustancia como piedras sanitarias para gatos o posos (residuos) de caf. Luego deseche el [employer] un recipiente cerrado, como una bolsa sellada o una lata de caf con tapa. No utilice este medicamento despus de la fecha de vencimiento. Guarde a FPL Group, entre 20 y 64 grados Celsius (77 y 65 grados Fahrenheit). [place]. ATENCIN: Este folleto es un resumen. Puede ser que no cubra toda la posible informacin. Si usted tiene preguntas acerca de esta medicina, consulte con su mdico, su farmacutico o su profesional de Technical sales engineer.  2018 Elsevier/Gold Standard (2016-04-09 00:00:00)  Hacer ejercicio para bajar de  peso (Exercising to [employer] Inc) Hacer ejercicio puede ayudarlo a bajar de peso. Para bajar de [facility] ejercicio, este debe ser de intensidad vigorosa. Puede saber que est haciendo ejercicio de intensidad vigorosa si respira con mucha dificultad y rapidez, y no puede mantener una conversacin. El ejercicio de intensidad moderada ayuda a Contractor peso actual. Puede saber que est haciendo ejercicio de intensidad moderada si tiene una frecuencia cardaca ms elevada y [place] respiracin ms rpida, pero an puede Programmer, systems. [place]? Elija una actividad que disfrute y establezca objetivos realistas. El mdico puede ayudarlo a Paediatric nurse un plan de actividades que funcione para usted. Haga ejercicio regularmente como se lo haya indicado el mdico. Esta puede incluir:  Neurosurgeon de [place] veces por semana, como: ? Flexiones de Merrill Lynch. ? Abdominales. ? Levantamiento de pesas. ? Ejercicios con bandas elsticas.  Realizar una intensidad determinada de ejercicio durante una cantidad determinada de [place]. Elija entre estas opciones: ? 180minutos de ejercicio de intensidad moderada cada semana. ? 89minutos de ejercicio de intensidad vigorosa cada semana. ? [employer] de ejercicio de intensidad moderada y vigorosa cada semana. Los nios, las mujeres [place], las personas que no estn en forma, las personas con sobrepeso y los adultos mayores tal vez tengan que consultar a un mdico para que les d Glass blower/designer. Si tiene [employer], asegrese de Teacher, adult education al mdico antes de comenzar un programa de ejercicios nuevo. CULES  [place]?  Caminar a un ritmo de al menos 4,33millas (7kilmetros) por hora.  Trotar o correr a un ritmo de 5 millas (8 kilmetros) por hora.  Andar en bicicleta a un ritmo de al menos 10 millas (16 kilmetros) por  hora.  Practicar natacin.  Practicar patinaje sobre ruedas normales o en lnea.  Hacer esqu de fondo.  Hacer deportes competitivos vigorosos, como ftbol americano, bsquet y ftbol.  Saltar la soga.  Tomar clases de baile aerbico. CMO PUEDO SER MS ACTIVO EN MIS ACTIVIDADES DIARIAS?  Utilice las Clinical cytogeneticist del ascensor.  D una caminata durante su hora de almuerzo.  Si conduce, estacione el automvil ms lejos del trabajo o de la escuela.  Si [place] transporte pblico, bjese una parada antes y camine el resto del camino.  Pngase de pie y camine cada vez que haga llamadas telefnicas.  Levntese, estrese y camine cada 44minutos a lo largo del Training and development officer. [place]?  No haga ejercicio en exceso que pudiera hacer que se lastime, se sienta mareado o tenga dificultad para respirar.  Consulte al mdico antes de comenzar un programa de ejercicios nuevo.  Use ropa cmoda y calzado con buen soporte.  Beba gran cantidad de agua mientras hace ejercicios para evitar la deshidratacin o los golpes de Freight forwarder. Durante la actividad fsica se pierde agua corporal que se debe reponer.  Haga ejercicio hasta que se acelere su respiracin y sus latidos cardacos. Esta informacin no tiene Marine scientist el consejo del mdico. Asegrese de hacerle al mdico cualquier pregunta que tenga. Document Released: 06/13/2010 Document Revised: 03/30/2014 Document Reviewed: 08/10/2013 Elsevier Interactive Patient Education  2018 [place] de caloras para bajar de peso Calorie Counting for [employer] Loss Las caloras son unidades de Teacher, early years/pre. Su cuerpo necesita una cierta cantidad de caloras de los alimentos para que le ayuden a Company secretary. Cuando come ms caloras de las que el cuerpo necesita, este acumula las caloras extra [place]. Cuando come [facility] de las que el cuerpo [place], este quema grasa para obtener la energa que  requiere. El recuento de caloras es el registro de la cantidad de caloras que come y Pharmacologist. El recuento de caloras puede ser de ayuda si necesita perder peso. Si se asegura de comer menos caloras de las que el cuerpo necesita, debe bajar de [place]. Pregntele al mdico cul es un peso sano para usted. Para que el recuento de caloras funcione, tendr que comer la cantidad de caloras adecuadas para usted en un da, para bajar una cantidad de peso saludable por semana. Un nutricionista puede determinar la cantidad de caloras que necesita por da y sugerirle cmo alcanzar su objetivo calrico.  Una cantidad de peso saludable para bajar por semana suele ser [place] 1 y Ivar Drape (0,5 a 0,9kg). Esto significa con frecuencia que su ingesta diaria de caloras se debera reducir unas 500 a 750caloras.  Ingerir 1200 a 1500caloras por Administrator, Civil Service a la [place] a Administrator, Civil Service.  Ingerir de 1500 a 1800caloras por Administrator, Civil Service a la [employer] de los hombres a Administrator, Civil Service.  En qu consiste el plan? Mi objetivo es comer __________ Raenette Rover da. Si como esta cantidad de caloras por da, debo bajar unas __________ Terrall Laity. Qu debo saber acerca del recuento de caloras? A fin de alcanzar su objetivo diario de caloras, tendr que:  Averiguar cuntas caloras hay en cada alimento que le Therapist, occupational. Intente hacerlo antes de comer.  Decida la cantidad que puede comer del alimento.  Anote lo que comi y cuntas caloras tena. Esta tarea se conoce como llevar un registro de comidas.  Para perder peso con xito es importante equilibrar el recuento de caloras con un estilo de vida saludable que incluya actividad fsica de forma regular. Tenga un objetivo de 182minutos de ejercicio moderado (como caminar) o 75 minutos de ejercicio vigoroso (como correr) todas las semanas. Dnde encuentro informacin sobre las caloras?  Es posible Animator cantidad de  caloras que contiene un alimento en la etiqueta de informacin nutricional. Si un alimento no tiene una etiqueta de informacin nutricional, intente buscar las caloras en Internet o pida ayuda al nutricionista. Recuerde que las caloras se calculan por porcin. Si opta por comer ms de una porcin de un alimento, tendr [employer]ne[employer] Inc las caloras por porcin por la cantidad de porciones que planea comer. Por ejemplo, la etiqueta de un envase de pan puede decir que el tamao de una porcin es [place], y que una porcin tiene 90caloras. Si come 1rodaja, habr comido 90caloras. Si come 2rodajas, habr comido 180caloras. Cmo llevo un registro de comidas? Despus de cada comida, registre la siguiente informacin en el registro de comidas:  Lo que comi. No olvide incluir los aderezos, las salsas y otros extras de la comida.  La cantidad que comi. Esto se puede medir en tazas, onzas o cantidad de alimentos.  Cuntas caloras ingiri por comida y por bebida.  La cantidad total de caloras en la comida.  Tenga a Materials engineer de comidas, por ejemplo, en un anotador de bolsillo o utilice una aplicacin mvil o sitio web. Algunos programas calcularn las caloras y Automotive engineer la cantidad de caloras que le quedan para llegar al objetivo diario. Cules son algunos consejos para el recuento de caloras?  Use las caloras de los alimentos y las bebidas que lo sacien y no lo dejen con apetito: ? Algunos ejemplos de alimentos que lo sacian son los frutos secos y Engineer, mining de frutos secos, verduras, Advertising account planner y Clinical research associate con alto contenido de Pharmacist, hospital como los cereales integrales. Los alimentos con alto contenido de [place] son aquellos que tienen ms de 5g de fibra por porcin. ? Las [employer] Energy refrescos, especialmente las bebidas a base de caf y los jugos, que contienen muchas caloras, pero no le dan saciedad.  Coma alimentos nutritivos y evite las caloras vacas. Las  caloras vacas son aquellas que se obtienen de los alimentos o las bebidas que no contienen muchos nutrientes ni protenas, como los dulces y los refrescos. Es mejor comer una comida nutritiva altamente calrica (como un aguacate) que una con pocos nutrientes (como una bolsa de patatas fritas).  Sepa cuntas caloras tienen los alimentos que come con ms frecuencia. Esto le ayudar a contar las caloras ms rpidamente.  Preste atencin a las [employer] Data. Las bebidas de bajas caloras incluyen agua y refrescos sin Location manager.  Preste atencin a las etiquetas nutricionales de alimentos "bajos en grasas" o "sin grasas". Estos alimentos a veces tienen la misma cantidad de caloras o ms caloras que las versiones ricas en grasa. Con frecuencia, tambin tienen agregados de azcar, almidn o sal, para darles el sabor que fue eliminado con la grasa.  Encuentre un mtodo para controlar las caloras que funcione para usted. Sea creativo. Pruebe aplicaciones o programas distintos, si llevar un registro  de las caloras no funciona para usted. Cules son algunos consejos para controlar las porciones?  Sepa cuntas caloras hay en una porcin. Esto lo ayudar a saber cuntas porciones de un alimento determinado puede comer.  Use una taza medidora para medir los tamaos de las porciones. Tambin Secondary school teacher las porciones en una balanza de cocina. Con el tiempo, podr hacer un clculo estimativo de los tamaos de las porciones de algunos alimentos.  Dedique tiempo a poner porciones de diferentes alimentos en sus platos, tazones y tazas predilectos, a fin de saber cmo se ve una porcin.  Intente no comer directamente de una bolsa o una caja. Esto puede llevarlo a comer en exceso. Ponga la cantidad [place] gustara comer en una taza o un plato, a fin de asegurarse de que est comiendo la porcin correcta.  Use platos, vasos y tazones ms pequeos para no comer en exceso.  Intente no realizar  varias tareas al [employer] (como mirar la TV o usar su computadora) [place] come. Si es la hora de comer, sintese a Conservation officer, nature y disfrute de Environmental education officer. Esto lo ayudar a Marine scientist cundo est satisfecho. Tambin le ayudar a tomar conciencia de lo que est comiendo y de la cantidad. Cules son algunos consejos para seguir este plan? Lectura de las etiquetas de los alimentos  Controle el recuento de caloras en comparacin con el tamao de la porcin. El tamao de la porcin puede ser ms pequeo de lo que suele comer.  Verifique la fuente de las caloras. Asegrese de que la comida que ingiere tenga alto contenido de vitaminas y protenas y sea baja en grasas saturadas y grasas trans. De compras  Lea las etiquetas nutricionales cuando compre. Esto le ayudar a tomar decisiones ms saludables antes de comprar [employer] Energy Corporation.  Haga una lista para el almacn y resptela. La coccin  Intente cocinar sus alimentos preferidos de una manera ms saludable. Por ejemplo, pruebe hornear en vez de frer.  Utilice productos lcteos descremados. Planificacin de los alimentos  Utilice ms frutas y verduras. La mitad de sus platos debe ser de frutas y verduras.  Incluya protenas [employer]McGee y el pescado. Cmo puedo hacer el recuento de caloras cuando como afuera?  Pida porciones ms pequeas.  Considere la posibilidad de Publishing rights manager un plato principal y las guarniciones, en lugar de pedir su propio plato principal.  Si pide su propio plato principal, coma solo la mitad. Pida una caja al comienzo de la comida y ponga all el resto del plato principal, para no sentir la tentacin de comerlo.  Si se detallan las caloras en el men, elija las opciones que contengan la menor cantidad.  Elija platos que incluyan verduras, frutas, cereales integrales, productos lcteos con bajo contenido de grasa y Advertising account planner.  Opte por los alimentos hervidos, asados, cocidos a la parrilla o al vapor. No  coma alimentos que contengan mantequilla, estn empanados, fritos o que se sirvan con salsa a base de crema. Generalmente, los alimentos que se etiquetan como "crujientes" estn fritos, a menos que se indique lo contrario.  Elija el agua, la [place], [place] t helado sin azcar u otras bebidas que no contengan azcares agregados. Si desea una bebida alcohlica, escoja una opcin con menos caloras como una copa de vino o una cerveza ligera.  Ordene los Kimberly[employer]ark, las salsas y los jarabes aparte. Estos son, con frecuencia, de alto contenido en caloras, por lo que debe limitar la cantidad que ingiere.  Si desea [place], elija una de hortalizas y pida carnes a la parrilla. Evite las guarniciones adicionales como el tocino, el queso o los alimentos fritos. Ordene el aderezo aparte o pida aceite de [place] y vinagre o limn para Haematologist.  Haga un clculo estimativo de la cantidad de porciones que le sirven. Por ejemplo, una porcin de arroz cocido equivale a media taza o la mitad del tamao de una pelota de bisbol. [place] el tamao de las porciones lo ayudar a Personnel officer atento a la cantidad de comida que come Occidental Petroleum. La lista que sigue le [place] el tamao de algunas porciones comunes a partir de objetos cotidianos: ? 1onza (28g) = 4dados apilados. ? 3onzas (85g) = 96mazo de cartas. ? 1cucharadita = 1dado. ? 1cucharada = media pelota de tenis de mesa. ? 2cucharadas = 1pelota de tenis de mesa. ? Media taza = media pelota de bisbol. ? 1taza = 1 pelota de bisbol. Resumen  El recuento de caloras es el registro de la cantidad de caloras que come y Pharmacologist. Si come menos caloras de las que el cuerpo necesita, debe bajar de [place].  Una cantidad de peso saludable para bajar por semana suele ser [place] 1 y Ivar Drape (0,5 a 0,9kg). Esto significa, con frecuencia, reducir su ingesta diaria de caloras unas 500 a 750 caloras.  Es posible Animator cantidad de  caloras que contiene un alimento en la etiqueta de informacin nutricional. Si un alimento no tiene una etiqueta de informacin nutricional, intente buscar las caloras en Internet o pida ayuda al nutricionista.  Use las caloras de los alimentos y las bebidas que lo sacien y no de los alimentos y las bebidas que lo dejan con apetito.  Use platos, vasos y tazones ms pequeos para no comer en exceso. Esta informacin no tiene Marine scientist el consejo del mdico. Asegrese de hacerle al mdico cualquier pregunta que tenga. Document Released: 06/25/2008 Document Revised: 06/08/2016 Document Reviewed: 06/08/2016 Elsevier Interactive Patient Education  [employer].

## 2017-11-11 NOTE — Progress Notes (Signed)
Subjective:  Patient ID: Natalie Martinez, female    DOB: 09/26/1965  Age: 52 y.o. MRN: [id number]  CC: Follow-up   HPI Natalie Martinez presents for follow-up of her hordeolum.  She feels as though it is doing better.  She is also following up for her sacroiliitis with a positive HLA-B27 antigen.  SI films did confirm sacroiliitis but lumbar films were normal.  She does admit to pain and stiffness in her lower back as well as in the pelvic area.  She requests medical therapy for weight loss.  Rheumatology is just now contacted her today for a consultation appointment.  CRP and sed rates are being drawn today.  Outpatient Medications Prior to Visit  Medication Sig Dispense Refill  . doxycycline (VIBRA-TABS) 100 MG tablet Take 1 tablet (100 mg total) by mouth 2 (two) times daily. 20 tablet 0  . hydroquinone 4 % cream Apply daily to affected area for  one month 28.35 g 0  . meloxicam (MOBIC) 15 MG tablet Take 1 tablet (15 mg total) by mouth daily. For 10 days and then as needed. 30 tablet 0  . methocarbamol (ROBAXIN) 500 MG tablet Take 1 tablet (500 mg total) by mouth every 8 (eight) hours as needed for up to 10 days for muscle spasms. 30 tablet 0  . simvastatin (ZOCOR) 40 MG tablet Take 1 tablet (40 mg total) by mouth at bedtime. 90 tablet 1   No facility-administered medications prior to visit.     ROS Review of Systems  Constitutional: Negative for chills, fever and unexpected weight change.  Respiratory: Negative.   Cardiovascular: Negative.   Gastrointestinal: Negative.  Negative for abdominal distention, abdominal pain, blood in stool and constipation.  Endocrine: Negative for polyphagia and polyuria.  Musculoskeletal: Positive for arthralgias and back pain.  Skin: Negative for pallor and rash.  Allergic/Immunologic: Negative for immunocompromised state.  Neurological: Negative for weakness and light-headedness.  Hematological: Does not bruise/bleed easily.    Psychiatric/Behavioral: Negative.     Objective:  BP 128/80   Pulse 76   Ht 5' 0.5" (1.537 m)   Wt 159 lb (72.1 kg)   SpO2 98%   BMI 30.54 kg/m   BP Readings from Last 3 Encounters:  11/11/17 128/80  11/02/17 128/76  09/07/17 122/84    Wt Readings from Last 3 Encounters:  11/11/17 159 lb (72.1 kg)  11/02/17 159 lb (72.1 kg)  09/07/17 158 lb (71.7 kg)    Physical Exam  Constitutional: She is oriented to person, place, and time. She appears well-developed and well-nourished. No distress.  HENT:  Head: Normocephalic and atraumatic.  Right Ear: External ear normal.  Left Ear: External ear normal.  Eyes: Pupils are equal, round, and reactive to light. Conjunctivae and EOM are normal.    Pulmonary/Chest: Effort normal.  Musculoskeletal:       Lumbar back: She exhibits decreased range of motion, tenderness and bony tenderness.       Back:  Neurological: She is alert and oriented to person, place, and time.  Skin: Skin is warm and dry. She is not diaphoretic.  Psychiatric: She has a normal mood and affect. Her behavior is normal.    Lab Results  Component Value Date   WBC 4.9 06/21/2017   HGB 13.7 06/21/2017   HCT 41.4 06/21/2017   PLT 253.0 06/21/2017   GLUCOSE 103 (H) 06/21/2017   CHOL 202 (H) 06/21/2017   TRIG 166.0 (H) 06/21/2017   HDL 43.70 06/21/2017   [place]  125 (H) 06/21/2017   ALT 15 06/21/2017   AST 18 06/21/2017   NA 140 06/21/2017   K 4.3 06/21/2017   CL 101 06/21/2017   CREATININE 0.61 06/21/2017   BUN 14 06/21/2017   CO2 30 06/21/2017   TSH 1.70 06/21/2017    Dg Lumbar Spine Complete  Result Date: 03/08/2017 CLINICAL DATA:  Patient ports falling 2 months ago and has persistent mid to low back pain. EXAM: LUMBAR SPINE - COMPLETE 4+ VIEW COMPARISON:  Lumbar spine series of May 29, 2015 FINDINGS: The lumbar vertebral bodies are preserved in height. The pedicles and transverse processes are intact. The disc space heights are well maintained.  There is no spondylolisthesis. There is no significant facet joint hypertrophy. The observed portions of the sacrum exhibit no acute abnormalities. There are sclerotic changes associated with the iliac portions of the SI joints which are similar to those seen previously. IMPRESSION: No acute or significant chronic abnormality of the lumbar spine. Sclerosis of the iliac sides of the SI joints. Electronically Signed   By: David  Martinique M.D.   On: 03/08/2017 11:41   Dg Knee Complete 4 Views Left  Result Date: 03/08/2017 CLINICAL DATA:  Patient reports falling approximately 2 months ago and has persistent anterior left knee discomfort. EXAM: LEFT KNEE - COMPLETE 4+ VIEW COMPARISON:  None in PACs FINDINGS: The bones are subjectively adequately mineralized. There is no acute or old fracture nor dislocation. There is no joint effusion. The joint spaces are reasonably well-maintained. There is a sclerotic focus in following the endostium of the proximal right tibia medially. IMPRESSION: No acute bony abnormality of the knee is observed. No significant degenerative changes are observed. Sclerotic focus in the proximal shaft of the left tibia is likely a benign bone island but correlation with the site of the patient's symptoms is needed. If there are symptoms referrable to the upper tibia, MRI would be a useful next step to exclude occult malignancy or other acute tibial pathology. Electronically Signed   By: David  Martinique M.D.   On: 03/08/2017 11:39   Mm Digital Screening Bilateral  Result Date: 03/09/2017 CLINICAL DATA:  Screening. EXAM: DIGITAL SCREENING BILATERAL MAMMOGRAM WITH CAD COMPARISON:  Previous exam(s). ACR Breast Density Category c: The breast tissue is heterogeneously dense, which may obscure small masses. FINDINGS: There are no findings suspicious for malignancy. Images were processed with CAD. IMPRESSION: No mammographic evidence of malignancy. A result letter of this screening mammogram will be  mailed directly to the patient. RECOMMENDATION: Screening mammogram in one year. (Code:SM-B-01Y) BI-RADS CATEGORY  1: Negative. Electronically Signed   By: Franki Cabot M.D.   On: 03/09/2017 09:03    Assessment & Plan:   Natalie Martinez was seen today for follow-up.  Diagnoses and all orders for this visit:  Class 1 obesity due to excess calories without serious comorbidity with body mass index (BMI) of 30.0 to 30.9 in adult -     phentermine 37.5 MG capsule; Take 1 capsule (37.5 mg total) by mouth every morning.  Bilateral sacroiliitis (HCC) -     C-reactive protein -     Sedimentation rate  HLA-B27 positive arthropathy -     C-reactive protein -     Sedimentation rate  Hordeolum internum of left upper eyelid   I am having Natalie Martinez start on phentermine. I am also having her maintain her simvastatin, meloxicam, methocarbamol, doxycycline, and hydroquinone.  Meds ordered this encounter  Medications  . phentermine 37.5 MG capsule  Sig: Take 1 capsule (37.5 mg total) by mouth every morning.    Dispense:  30 capsule    Refill:  1   Anticipatory guidance was given to the patient in Spanish regarding ankylosing spondylitis, and phentermine, calorie counting and weight loss through exercising.  CRP and sed rate are drawn today.  Follow-up: Return in about 1 month (around 12/12/2017).  Libby Maw, MD

## 2017-11-12 LAB — SEDIMENTATION RATE: SED RATE: 31 mm/h — AB (ref 0–20)

## 2017-11-12 LAB — C-REACTIVE PROTEIN

## 2017-11-24 LAB — HEMOGLOBIN A1C
Estimated Avg Glucose, External: 98 mg/dL (ref 91–123)
Hemoglobin A1C, External: 5 % (ref 4.8–5.9)

## 2017-11-28 ENCOUNTER — Other Ambulatory Visit: Payer: Self-pay | Admitting: Family Medicine

## 2017-11-28 DIAGNOSIS — M461 Sacroiliitis, not elsewhere classified: Secondary | ICD-10-CM

## 2017-12-13 ENCOUNTER — Encounter: Payer: Self-pay | Admitting: Family Medicine

## 2018-01-05 ENCOUNTER — Telehealth: Payer: Self-pay

## 2018-01-05 NOTE — Telephone Encounter (Signed)
Copied from [place] [id number]. Topic: General - Other >> Jan 05, 2018  9:44 AM Leward Quan A wrote: Reason for CRM: Patient called to request a copy of her last physical done on 09/07/17. She asked for it to be faxed to # [phone]. Patient Ph# [phone] >> Jan 05, 2018 11:20 AM Rickard Rhymes wrote: This is not our patient.  Please forward to appropriate office.  Thanks.

## 2018-01-05 NOTE — Telephone Encounter (Signed)
Patient saw Dr. Dellis Filbert on 09/07/17 for a physical.

## 2018-01-05 NOTE — Telephone Encounter (Signed)
Pt will call dr Dellis Filbert office

## 2018-01-06 ENCOUNTER — Telehealth: Payer: Self-pay | Admitting: Family Medicine

## 2018-01-06 NOTE — Telephone Encounter (Signed)
Patient came in needing a copy or proof of physical this year. Please call patient per patient request to discuss. [phone].

## 2018-01-07 NOTE — Telephone Encounter (Signed)
I called and spoke with with patient. Letter created stating that patient had a physical with Dr. Ethelene Hal on 06/21/17. Will have Dr. Ethelene Hal sign & will fax to number provided by patient.

## 2018-01-07 NOTE — Telephone Encounter (Signed)
Letter signed & faxed.

## 2018-02-07 ENCOUNTER — Other Ambulatory Visit: Payer: Self-pay | Admitting: Family Medicine

## 2018-02-07 DIAGNOSIS — E785 Hyperlipidemia, unspecified: Secondary | ICD-10-CM

## 2018-03-24 ENCOUNTER — Encounter: Payer: Self-pay | Admitting: Family Medicine

## 2018-03-24 ENCOUNTER — Ambulatory Visit: Payer: Commercial Managed Care - PPO | Admitting: Family Medicine

## 2018-03-24 ENCOUNTER — Ambulatory Visit (INDEPENDENT_AMBULATORY_CARE_PROVIDER_SITE_OTHER): Payer: Commercial Managed Care - PPO

## 2018-03-24 VITALS — BP 110/70 | HR 74 | Temp 98.1°F | Ht 60.5 in | Wt 163.0 lb

## 2018-03-24 DIAGNOSIS — R0781 Pleurodynia: Secondary | ICD-10-CM | POA: Diagnosis not present

## 2018-03-24 DIAGNOSIS — M25512 Pain in left shoulder: Secondary | ICD-10-CM | POA: Diagnosis not present

## 2018-03-24 NOTE — Assessment & Plan Note (Signed)
Pain seems to be related to mild bursitis.  - counseled on HEP and supportive care - has diclofenac from her Rheum and can continue to take that.  - will f/u if no improvement and consider injection vs PT.

## 2018-03-24 NOTE — Progress Notes (Signed)
Natalie Martinez - 53 y.o. female MRN [id number]  Date of birth: 01/31/1966  SUBJECTIVE:  Including CC & ROS.  Chief Complaint  Patient presents with  . right rib pain    a few weeks ago, hard to breathe/cough - painful.    Natalie Martinez is a 53 y.o. female that is presenting with right mid axillary rib pain and left shoulder pain.  The rib pain has gotten worse recently.  Has been ongoing for a few weeks.  She has pain when she takes a deep breath or coughs.  It is localized to the mid axillary line around the seventh or eighth rib.  Denies any trauma or inciting event.  Pain is sharp and stabbing in nature.  Has not had any improvement with modalities to date.  Also experiencing left shoulder pain.  Pain is been occurring for a few weeks.  Pain is localized to the shoulder.  She feels it with abduction and internal rotation.  She denies any radicular symptoms.  Pain is intermittent and has been staying the same.  It is sharp and stabbing in nature.  No history of prior symptoms.  It is worse when she is reaching for certain objects or performing certain movements.    Review of Systems  Constitutional: Negative for fever.  HENT: Negative for congestion.   Respiratory: Negative for cough.   Cardiovascular: Negative for chest pain.  Gastrointestinal: Negative for abdominal pain.  Musculoskeletal: Positive for arthralgias.  Skin: Negative for color change.  Neurological: Negative for weakness.  Hematological: Negative for adenopathy.  Psychiatric/Behavioral: Negative for agitation.    HISTORY: Past Medical, Surgical, Social, and Family History Reviewed & Updated per EMR.   Pertinent Historical Findings include:  Past Medical History:  Diagnosis Date  . Allergy   . Hypercholesterolemia   . NSVD (normal spontaneous vaginal delivery)    X3  . Vertigo     Past Surgical History:  Procedure Laterality Date  . CERVICAL BIOPSY  W/ LOOP ELECTRODE EXCISION  2004  &  2005    X 2  . [place] OF UTERUS  2000 & 2003  . VAGINAL HYSTERECTOMY  2005   RECURRENT CERVICAL DYSPLASIA    No Known Allergies  Family History  Problem Relation Age of Onset  . Diabetes Mother   . Breast cancer Neg Hx      Social History   Socioeconomic History  . Marital status: Single    Spouse name: Not on file  . Number of children: 3  . Years of education: 50  . Highest education level: Not on file  Occupational History  . Occupation: Glass blower/designer  Social Needs  . Financial resource strain: Not on file  . Food insecurity:    Worry: Not on file    Inability: Not on file  . Transportation needs:    Medical: Not on file    Non-medical: Not on file  Tobacco Use  . Smoking status: Never Smoker  . Smokeless tobacco: Never Used  Substance and Sexual Activity  . Alcohol use: No    Alcohol/week: 0.0 standard drinks  . Drug use: No  . Sexual activity: Not Currently    Birth control/protection: None, Surgical    Comment: 1st intercourse- 25, partners- 3,   Lifestyle  . Physical activity:    Days per week: Not on file    Minutes per session: Not on file  . Stress: Not on file  Relationships  . Social connections:  Talks on phone: Not on file    Gets together: Not on file    Attends religious service: Not on file    Active member of club or organization: Not on file    Attends meetings of clubs or organizations: Not on file    Relationship status: Not on file  . Intimate partner violence:    Fear of current or ex partner: Not on file    Emotionally abused: Not on file    Physically abused: Not on file    Forced sexual activity: Not on file  Other Topics Concern  . Not on file  Social History Narrative   Lives at home with her daughter and her mother.   Occasional caffeine use (coffee twice weekly).   Right-handed.     PHYSICAL EXAM:  VS: BP 110/70   Pulse 74   Temp 98.1 F (36.7 C) (Oral)   Ht 5' 0.5" (1.537 m)   Wt 163 lb (73.9  kg)   SpO2 99%   BMI 31.31 kg/m  Physical Exam Gen: NAD, alert, cooperative with exam, well-appearing ENT: normal lips, normal nasal mucosa,  Eye: normal EOM, normal conjunctiva and lids CV:  no edema, +2 pedal pulses   Resp: no accessory muscle use, non-labored,  Skin: no rashes, no areas of induration  Neuro: normal tone, normal sensation to touch Psych:  normal insight, alert and oriented MSK:  Right ribs:  TTP along the midaxillary rib line around the 7-9 rib.  A mobile nodule is palpated where tender  No step offs or crepitus  Let Shoulder: Inspection reveals no abnormalities, atrophy or asymmetry. Palpation is normal with no tenderness over AC joint  ROM is full in all planes. Rotator cuff strength normal throughout. Pain with Hawkin's tests, empty can sign. Neurovascularly intact   Limited ultrasound: left shoulder/right ribs:  Right ribs:  Normal appearing ribs  Nodule appreciated in the subcutaneous tissue. No vascular uptake. Possible for lipoma   Left shoulder:  Supraspinatus with mild tendinopathy type changes. 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  Left shoulder with mild subacromial bursitis.    Ultrasound and interpretation by Clearance Coots, MD      ASSESSMENT & PLAN:   Rib pain on right side Pain seems to be related to nodule that is palpated and found on [place]. Has a positive HLA-B27. Doesn't appear to be inflammatory change. Possible for lipoma.  - xray  - can try pennsaid  - if pain worsens consider further imaging.   Acute pain of left shoulder Pain seems to be related to mild bursitis.  - counseled on HEP and supportive care - has diclofenac from her Rheum and can continue to take that.  - will f/u if no improvement and consider injection vs PT.

## 2018-03-24 NOTE — Patient Instructions (Signed)
Nice to meet you  Please try the exercises for the shoulder  I will call you with the results from today  Please follow up with me in 2 weeks if no better.

## 2018-03-24 NOTE — Assessment & Plan Note (Signed)
Pain seems to be related to nodule that is palpated and found on [place]. Has a positive HLA-B27. Doesn't appear to be inflammatory change. Possible for lipoma.  - xray  - can try pennsaid  - if pain worsens consider further imaging.

## 2018-03-25 ENCOUNTER — Telehealth: Payer: Self-pay | Admitting: Family Medicine

## 2018-03-25 MED ORDER — DICLOFENAC SODIUM 2 % TD SOLN: 1.0000 "application " | Freq: Two times a day (BID) | TRANSDERMAL | 3 refills | Status: DC

## 2018-03-25 NOTE — Telephone Encounter (Signed)
Spoke with patient about xrays. Will send in pennsaid.   Rosemarie Ax, MD [facility] 03/25/2018, 5:09 PM

## 2018-04-06 NOTE — Telephone Encounter (Signed)
Pharmacy calling to initiate PA for pennsaid. Needs diagnosis, and whether patient has tried and failed any other similar medication, whether it's used for arthritis pain, whether the diagnosis affects her knees, and if the medication is a continuation of therapy?

## 2018-04-15 NOTE — Telephone Encounter (Signed)
PA initiated. KEY: [id number]

## 2018-05-18 ENCOUNTER — Other Ambulatory Visit: Payer: Self-pay | Admitting: Obstetrics & Gynecology

## 2018-05-18 DIAGNOSIS — Z1231 Encounter for screening mammogram for malignant neoplasm of breast: Secondary | ICD-10-CM

## 2018-05-26 ENCOUNTER — Ambulatory Visit: Payer: Commercial Managed Care - PPO | Admitting: Family Medicine

## 2018-05-26 ENCOUNTER — Encounter: Payer: Self-pay | Admitting: Family Medicine

## 2018-05-26 VITALS — BP 102/70 | HR 87 | Temp 97.9°F | Ht 60.5 in | Wt 162.2 lb

## 2018-05-26 DIAGNOSIS — R300 Dysuria: Secondary | ICD-10-CM

## 2018-05-26 LAB — POCT URINALYSIS DIPSTICK
BILIRUBIN UA: NEGATIVE
GLUCOSE UA: NEGATIVE
KETONES UA: NEGATIVE
NITRITE UA: NEGATIVE
Protein, UA: NEGATIVE
SPEC GRAV UA: 1.015 (ref 1.010–1.025)
UROBILINOGEN UA: 0.2 U/dL
pH, UA: 7 (ref 5.0–8.0)

## 2018-05-26 MED ORDER — NITROFURANTOIN MONOHYD MACRO 100 MG PO CAPS: 100.0000 mg | ORAL_CAPSULE | Freq: Two times a day (BID) | ORAL | 0 refills | Status: AC

## 2018-05-26 NOTE — Progress Notes (Signed)
Yoshie Kosel is a 53 y.o. female  Chief Complaint  Patient presents with  . Urinary Tract Infection    burning, frequent urination/ 4 days    HPI: Coraleigh Sheeran is a 53 y.o. female complains of 4 day h/o dysuria and urinary frequency. No urgency. No fever, chills. No n/v. No abdominal or back pain.  She notes a h/o UTI but cannot recall when her last one was.  She has been drinking cranberry juice.   Past Medical History:  Diagnosis Date  . Allergy   . Hypercholesterolemia   . NSVD (normal spontaneous vaginal delivery)    X3  . Vertigo     Past Surgical History:  Procedure Laterality Date  . CERVICAL BIOPSY  W/ LOOP ELECTRODE EXCISION  2004  &  2005   X 2  . [place] OF UTERUS  2000 & 2003  . VAGINAL HYSTERECTOMY  2005   RECURRENT CERVICAL DYSPLASIA    Social History   Socioeconomic History  . Marital status: Single    Spouse name: Not on file  . Number of children: 3  . Years of education: 37  . Highest education level: Not on file  Occupational History  . Occupation: Glass blower/designer  Social Needs  . Financial resource strain: Not on file  . Food insecurity:    Worry: Not on file    Inability: Not on file  . Transportation needs:    Medical: Not on file    Non-medical: Not on file  Tobacco Use  . Smoking status: Never Smoker  . Smokeless tobacco: Never Used  Substance and Sexual Activity  . Alcohol use: No    Alcohol/week: 0.0 standard drinks  . Drug use: No  . Sexual activity: Not Currently    Birth control/protection: None, Surgical    Comment: 1st intercourse- 25, partners- 3,   Lifestyle  . Physical activity:    Days per week: Not on file    Minutes per session: Not on file  . Stress: Not on file  Relationships  . Social connections:    Talks on phone: Not on file    Gets together: Not on file    Attends religious service: Not on file    Active member of club or organization: Not on file    Attends  meetings of clubs or organizations: Not on file    Relationship status: Not on file  . Intimate partner violence:    Fear of current or ex partner: Not on file    Emotionally abused: Not on file    Physically abused: Not on file    Forced sexual activity: Not on file  Other Topics Concern  . Not on file  Social History Narrative   Lives at home with her daughter and her mother.   Occasional caffeine use (coffee twice weekly).   Right-handed.    Family History  Problem Relation Age of Onset  . Diabetes Mother   . Breast cancer Neg Hx      Immunization History  Administered Date(s) Administered  . Influenza Whole 03/19/2010  . Influenza,inj,Quad PF,6+ Mos 01/31/2015  . Td 03/21/2008  . Tdap 11/06/2011    Outpatient Encounter Medications as of 05/26/2018  Medication Sig  . diclofenac (VOLTAREN) 75 MG EC tablet Take 75 mg by mouth 2 (two) times daily.  . Diclofenac Sodium (PENNSAID) 2 % SOLN Place 1 application onto the skin 2 (two) times daily.  . simvastatin (ZOCOR) 40 MG tablet  TAKE 1 TABLET(40 MG) BY MOUTH AT BEDTIME   No facility-administered encounter medications on file as of 05/26/2018.      ROS: Pertinent positives and negatives noted in HPI. Remainder of ROS non-contributory  No Known Allergies  BP 102/70   Pulse 87   Temp 97.9 F (36.6 C) (Oral)   Ht 5' 0.5" (1.537 m)   Wt 162 lb 3.2 oz (73.6 kg)   SpO2 97%   BMI 31.16 kg/m   Physical Exam  Constitutional: She is oriented to person, place, and time. She appears well-developed and well-nourished. No distress.  Abdominal: Soft. Bowel sounds are normal. She exhibits no distension. There is no abdominal tenderness. There is no rebound, no guarding and no CVA tenderness.  Neurological: She is alert and oriented to person, place, and time.  Skin: Skin is warm and dry. No rash noted.   Component     Latest Ref Rng & Units 05/26/2018  Color, UA      yellow  Clarity, UA      clear  Glucose     Negative  Negative  Bilirubin, UA      neg  Ketones, UA      neg  Specific Gravity, UA     1.010 - 1.025 1.015  RBC, UA      +-  pH, UA     5.0 - 8.0 7.0  Protein, UA     Negative Negative  Urobilinogen, UA     0.2 or 1.0 E.U./dL 0.2  Nitrite, UA      neg  Leukocytes, UA     Negative Trace (A)  Appearance     Clear     A/P:  1. Dysuria - POCT Urinalysis Dipstick - + leukocyte and RBC, neg nitrites - Urine Culture - increase water intake Rx: - nitrofurantoin, macrocrystal-monohydrate, (MACROBID) 100 MG capsule; Take 1 capsule (100 mg total) by mouth 2 (two) times daily for 5 days.  Dispense: 10 capsule; Refill: 0 - will contact pt once cx result available  Discussed plan and reviewed medications with patient, including risks, benefits, and potential side effects. Pt expressed understand. All questions answered.

## 2018-05-28 LAB — URINE CULTURE
MICRO NUMBER: 281469
SPECIMEN QUALITY:: ADEQUATE

## 2018-06-06 ENCOUNTER — Ambulatory Visit: Payer: Self-pay

## 2018-06-06 NOTE — Telephone Encounter (Addendum)
Returned call to patient who states she has already been contacted and the doctor is calling her in an RX. She refused need for appointment.  Call placed to patient and was triaging when she left phone to check thew antibiotic she was taking. Pt never returned to call Redialed and left VM to return call to office  Attempted second call Left VM to retiurn call to office Answer Assessment - Initial Assessment Questions 1. ANTIBIOTIC: "What antibiotic are you taking?" "How many times per day?"    Microbid 100 mg 2x day  2. DURATION: "When was the antibiotic started?"     *No Answer* 3. MAIN SYMPTOM: "What is the main symptom you are concerned about?"     Blood in urine 4. FEVER: "Do you have a fever?" If so, ask: "What is it, how was it measured, and when did it start?"     *No Answer* 5. OTHER SYMPTOMS: "Do you have any other symptoms?" (e.g., flank pain, vaginal discharge, blood in urine)    Blood in urine  Protocols used: URINARY TRACT INFECTION ON ANTIBIOTIC FOLLOW-UP CALL - FEMALE-A-AH

## 2018-06-07 ENCOUNTER — Telehealth: Payer: Self-pay

## 2018-06-07 MED ORDER — CIPROFLOXACIN HCL 500 MG PO TABS: 500.0000 mg | ORAL_TABLET | Freq: Two times a day (BID) | ORAL | 0 refills | Status: AC

## 2018-06-07 NOTE — Telephone Encounter (Signed)
Dr.C please advise

## 2018-06-07 NOTE — Telephone Encounter (Signed)
Pt aware that rx was sent to pharmacy on file.

## 2018-06-07 NOTE — Telephone Encounter (Signed)
Copied from [place] [id number]. Topic: General - Other >> Jun 07, 2018  8:15 AM Leward Quan A wrote: Reason for CRM: Patient called to say that she is having burning and now blood in her urine due to UTI say that the medication that she have been taking is not working. She is asking for a stronger antibiotic sent to the pharmacy and asking for a call back when this is done. Ph# [phone]

## 2018-06-07 NOTE — Addendum Note (Signed)
Addended by: Ronnald Nian on: 06/07/2018 10:06 AM   Modules accepted: Orders

## 2018-06-07 NOTE — Telephone Encounter (Signed)
Will send new abx to her pharm on file

## 2018-06-13 ENCOUNTER — Ambulatory Visit: Payer: Commercial Managed Care - PPO

## 2018-06-24 ENCOUNTER — Telehealth: Payer: Self-pay

## 2018-06-24 MED ORDER — FLUTICASONE PROPIONATE 50 MCG/ACT NA SUSP: 2.0000 | Freq: Every day | NASAL | 1 refills | Status: DC

## 2018-06-24 NOTE — Addendum Note (Signed)
Addended by: Rodrigo Ran on: 06/24/2018 01:38 PM   Modules accepted: Orders

## 2018-06-24 NOTE — Telephone Encounter (Signed)
Copied from [place] [phone]. Topic: General - Other >> Jun 24, 2018 12:11 PM Richardo Priest, NT wrote: Reason for CRM: Patient stated she would like to be prescribed an allergy medication for this allergy season, due to her symptoms increasing. Requesting something be called in at the [facility], [place] [phone] (Phone) [phone] (Fax)  Call back number is [phone].

## 2018-06-24 NOTE — Telephone Encounter (Signed)
My understanding is that there would be no copay with a WebEx  You can send it Flonase 2 sprays each nare q d #1 1RF

## 2018-06-24 NOTE — Telephone Encounter (Signed)
Rx sent. Advised patient if this does not help, we will need to set up a video visit.

## 2018-07-04 ENCOUNTER — Encounter: Payer: Self-pay | Admitting: Family Medicine

## 2018-07-04 ENCOUNTER — Ambulatory Visit (INDEPENDENT_AMBULATORY_CARE_PROVIDER_SITE_OTHER): Payer: Commercial Managed Care - PPO | Admitting: Family Medicine

## 2018-07-04 VITALS — Ht 60.5 in

## 2018-07-04 DIAGNOSIS — J069 Acute upper respiratory infection, unspecified: Secondary | ICD-10-CM | POA: Diagnosis not present

## 2018-07-04 MED ORDER — BENZONATATE 200 MG PO CAPS: 200.0000 mg | ORAL_CAPSULE | Freq: Two times a day (BID) | ORAL | 0 refills | Status: DC | PRN

## 2018-07-04 NOTE — Progress Notes (Signed)
Virtual Visit via Video Note  I connected with Natalie Martinez on 07/04/18 at 11:00 AM EDT by a video enabled telemedicine application and verified that I am speaking with the correct person using two identifiers.   I discussed the limitations of evaluation and management by telemedicine and the availability of in person appointments. The patient expressed understanding and agreed to proceed.  History of Present Illness:    Observations/Objective:   Assessment and Plan:   Follow Up Instructions:    I discussed the assessment and treatment plan with the patient. The patient was provided an opportunity to ask questions and all were answered. The patient agreed with the plan and demonstrated an understanding of the instructions.   The patient was advised to call back or seek an in-person evaluation if the symptoms worsen or if the condition fails to improve as anticipated.  I provided 15   Established Patient Office Visit  Subjective:  Patient ID: Natalie Martinez, female    DOB: 1965-04-21  Age: 53 y.o. MRN: [id number]  CC:  Chief Complaint  Patient presents with  . Cough  . Generalized Body Aches    HPI Natalie Martinez presents for evaluation and treatment of a 3 to 4-day history of nasal congestion postnasal drip and cough.  Patient denies headaches, fevers chills, facial pressure teeth pain, wheezing, myalgias or arthralgias.  Denies itchy watery eyes or much sneezing.  She is feeling okay.  She has been taking NyQuil at night with some relief.  She has a nagging cough throughout the day.  Past Medical History:  Diagnosis Date  . Allergy   . Hypercholesterolemia   . NSVD (normal spontaneous vaginal delivery)    X3  . Vertigo     Past Surgical History:  Procedure Laterality Date  . CERVICAL BIOPSY  W/ LOOP ELECTRODE EXCISION  2004  &  2005   X 2  . [place] OF UTERUS  2000 & 2003  . VAGINAL HYSTERECTOMY  2005   RECURRENT CERVICAL  DYSPLASIA    Family History  Problem Relation Age of Onset  . Diabetes Mother   . Breast cancer Neg Hx     Social History   Socioeconomic History  . Marital status: Single    Spouse name: Not on file  . Number of children: 3  . Years of education: 77  . Highest education level: Not on file  Occupational History  . Occupation: Glass blower/designer  Social Needs  . Financial resource strain: Not on file  . Food insecurity:    Worry: Not on file    Inability: Not on file  . Transportation needs:    Medical: Not on file    Non-medical: Not on file  Tobacco Use  . Smoking status: Never Smoker  . Smokeless tobacco: Never Used  Substance and Sexual Activity  . Alcohol use: No    Alcohol/week: 0.0 standard drinks  . Drug use: No  . Sexual activity: Not Currently    Birth control/protection: None, Surgical    Comment: 1st intercourse- 25, partners- 3,   Lifestyle  . Physical activity:    Days per week: Not on file    Minutes per session: Not on file  . Stress: Not on file  Relationships  . Social connections:    Talks on phone: Not on file    Gets together: Not on file    Attends religious service: Not on file    Active member of club or  organization: Not on file    Attends meetings of clubs or organizations: Not on file    Relationship status: Not on file  . Intimate partner violence:    Fear of current or ex partner: Not on file    Emotionally abused: Not on file    Physically abused: Not on file    Forced sexual activity: Not on file  Other Topics Concern  . Not on file  Social History Narrative   Lives at home with her daughter and her mother.   Occasional caffeine use (coffee twice weekly).   Right-handed.    Outpatient Medications Prior to Visit  Medication Sig Dispense Refill  . diclofenac (VOLTAREN) 75 MG EC tablet Take 75 mg by mouth 2 (two) times daily.    . Diclofenac Sodium (PENNSAID) 2 % SOLN Place 1 application onto the skin 2 (two) times daily. 1  Bottle 3  . fluticasone (FLONASE) 50 MCG/ACT nasal spray Place 2 sprays into both nostrils daily. 16 g 1  . simvastatin (ZOCOR) 40 MG tablet TAKE 1 TABLET(40 MG) BY MOUTH AT BEDTIME 90 tablet 1   No facility-administered medications prior to visit.     No Known Allergies  ROS Review of Systems  Constitutional: Negative for chills, diaphoresis, fatigue, fever and unexpected weight change.  HENT: Positive for congestion and postnasal drip. Negative for dental problem, sinus pressure, sinus pain, sneezing and sore throat.   Respiratory: Negative for cough, shortness of breath and wheezing.   Cardiovascular: Negative.   Gastrointestinal: Negative.   Musculoskeletal: Negative for arthralgias and myalgias.  Skin: Negative.   Neurological: Negative for headaches.  Hematological: Negative.   Psychiatric/Behavioral: Negative.       Objective:    Physical Exam  Constitutional: She is oriented to person, place, and time. She appears well-developed and well-nourished. No distress.  HENT:  Head: Normocephalic and atraumatic.  Right Ear: External ear normal.  Left Ear: External ear normal.  Eyes: Right eye exhibits no discharge. Left eye exhibits no discharge. No scleral icterus.  Neck: No JVD present. No tracheal deviation present.  Pulmonary/Chest: Effort normal. No stridor.  Neurological: She is alert and oriented to person, place, and time.  Skin: She is not diaphoretic.  Psychiatric: She has a normal mood and affect. Her behavior is normal.    Ht 5' 0.5" (1.537 m)   BMI 31.16 kg/m  Wt Readings from Last 3 Encounters:  05/26/18 162 lb 3.2 oz (73.6 kg)  03/24/18 163 lb (73.9 kg)  11/11/17 159 lb (72.1 kg)     Health Maintenance Due  Topic Date Due  . COLONOSCOPY  01/16/2018    There are no preventive care reminders to display for this patient.  Lab Results  Component Value Date   TSH 1.70 06/21/2017   Lab Results  Component Value Date   WBC 4.9 06/21/2017   HGB  13.7 06/21/2017   HCT 41.4 06/21/2017   MCV 95.5 06/21/2017   PLT 253.0 06/21/2017   Lab Results  Component Value Date   NA 140 06/21/2017   K 4.3 06/21/2017   CO2 30 06/21/2017   GLUCOSE 103 (H) 06/21/2017   BUN 14 06/21/2017   CREATININE 0.61 06/21/2017   BILITOT 0.8 06/21/2017   ALKPHOS 70 06/21/2017   AST 18 06/21/2017   ALT 15 06/21/2017   PROT 7.6 06/21/2017   ALBUMIN 4.2 06/21/2017   CALCIUM 9.8 06/21/2017   GFR 110.60 06/21/2017   Lab Results  Component Value Date  CHOL 202 (H) 06/21/2017   Lab Results  Component Value Date   HDL 43.70 06/21/2017   Lab Results  Component Value Date   LDLCALC 125 (H) 06/21/2017   Lab Results  Component Value Date   TRIG 166.0 (H) 06/21/2017   Lab Results  Component Value Date   CHOLHDL 5 06/21/2017   No results found for: HGBA1C    Assessment & Plan:   Problem List Items Addressed This Visit    None    Visit Diagnoses    Viral upper respiratory tract infection    -  Primary   Relevant Medications   benzonatate (TESSALON) 200 MG capsule      Meds ordered this encounter  Medications  . benzonatate (TESSALON) 200 MG capsule    Sig: Take 1 capsule (200 mg total) by mouth 2 (two) times daily as needed for cough.    Dispense:  20 capsule    Refill:  0    Follow-up: Return in about 1 week (around 07/11/2018), or if symptoms worsen or fail to improve.    Natalie Maw, MD minutes of non-face-to-face time during this encounter.  Patient will follow-up in 1 week if not improving on her own.  Asked her to self isolate at home.  She was unaware that covid could present with mild symptoms in some individuals.  She will use Tylenol for aches and pains.  She will continue to use her NyQuil at night.

## 2018-07-20 ENCOUNTER — Ambulatory Visit: Payer: Commercial Managed Care - PPO

## 2018-09-12 ENCOUNTER — Other Ambulatory Visit: Payer: Self-pay

## 2018-09-13 ENCOUNTER — Ambulatory Visit: Payer: Commercial Managed Care - PPO | Admitting: Obstetrics & Gynecology

## 2018-09-13 ENCOUNTER — Encounter: Payer: Self-pay | Admitting: Obstetrics & Gynecology

## 2018-09-13 VITALS — BP 118/78 | Ht 60.0 in | Wt 162.0 lb

## 2018-09-13 DIAGNOSIS — Z90710 Acquired absence of both cervix and uterus: Secondary | ICD-10-CM | POA: Diagnosis not present

## 2018-09-13 DIAGNOSIS — N951 Menopausal and female climacteric states: Secondary | ICD-10-CM | POA: Diagnosis not present

## 2018-09-13 DIAGNOSIS — E6609 Other obesity due to excess calories: Secondary | ICD-10-CM

## 2018-09-13 DIAGNOSIS — N890 Mild vaginal dysplasia: Secondary | ICD-10-CM | POA: Diagnosis not present

## 2018-09-13 DIAGNOSIS — Z6831 Body mass index (BMI) 31.0-31.9, adult: Secondary | ICD-10-CM

## 2018-09-13 DIAGNOSIS — Z01419 Encounter for gynecological examination (general) (routine) without abnormal findings: Secondary | ICD-10-CM

## 2018-09-13 MED ORDER — ESTRADIOL 0.05 MG/24HR TD PTTW: 1.0000 | MEDICATED_PATCH | TRANSDERMAL | 4 refills | Status: DC

## 2018-09-13 NOTE — Progress Notes (Signed)
Natalie Martinez 1965/08/25 [id number]   History:    53 y.o. G3P3L3 Single  RP:  Established patient presenting for annual gyn exam   HPI:  S/P Total Hysterectomy for Dysplasia, no residual on patho.  No pelvic pain.  Normal vaginal secretions.  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  C/O severe hot flushes/night sweats most days.  Currently abstinent.  Breasts wnl. BMI 31.64.  Health labs with Fam MD.  Past medical history,surgical history, family history and social history were all reviewed and documented in the EPIC chart.  Gynecologic History No LMP recorded. Patient has had a hysterectomy. Contraception: status post hysterectomy Last Pap: 08/2017. Results were: Negative/HPV HR negative Last mammogram: 02/2017. Results were: Negative.  Scheduled tomorrow for Mammo Bone Density: Never Colonoscopy: Refer to Gastro for first Colonoscopy  Obstetric History OB History  Gravida Para Term Preterm AB Living  3 3 3     3   SAB TAB Ectopic Multiple Live Births          3    # Outcome Date GA Lbr Len/2nd Weight Sex Delivery Anes PTL Lv  3 Term     F Vag-Spont  N LIV  2 Term     F Vag-Spont  N DEC  1 Term     M Vag-Spont  N LIV     ROS: A ROS was performed and pertinent positives and negatives are included in the history.  GENERAL: No fevers or chills. HEENT: No change in vision, no earache, sore throat or sinus congestion. NECK: No pain or stiffness. CARDIOVASCULAR: No chest pain or pressure. No palpitations. PULMONARY: No shortness of breath, cough or wheeze. GASTROINTESTINAL: No abdominal pain, nausea, vomiting or diarrhea, melena or bright red blood per rectum. GENITOURINARY: No urinary frequency, urgency, hesitancy or dysuria. MUSCULOSKELETAL: No joint or muscle pain, no back pain, no recent trauma. DERMATOLOGIC: No rash, no itching, no lesions. ENDOCRINE: No polyuria, polydipsia, no heat or cold intolerance. No recent change in weight. HEMATOLOGICAL: No anemia or  easy bruising or bleeding. NEUROLOGIC: No headache, seizures, numbness, tingling or weakness. PSYCHIATRIC: No depression, no loss of interest in normal activity or change in sleep pattern.     Exam:   BP 118/78 (BP Location: Right Arm, Patient Position: Sitting, Cuff Size: Normal)   Ht 5' (1.524 m)   Wt 162 lb (73.5 kg)   BMI 31.64 kg/m   Body mass index is 31.64 kg/m.  General appearance : Well developed well nourished female. No acute distress HEENT: Eyes: no retinal hemorrhage or exudates,  Neck supple, trachea midline, no carotid bruits, no thyroidmegaly Lungs: Clear to auscultation, no rhonchi or wheezes, or rib retractions  Heart: Regular rate and rhythm, no murmurs or gallops Breast:Examined in sitting and supine position were symmetrical in appearance, no palpable masses or tenderness,  no skin retraction, no nipple inversion, no nipple discharge, no skin discoloration, no axillary or supraclavicular lymphadenopathy Abdomen: no palpable masses or tenderness, no rebound or guarding Extremities: no edema or skin discoloration or tenderness  Pelvic: Vulva: Normal             Vagina: No gross lesions or discharge.  Pap reflex done  Cervix/Uterus absent  Adnexa  Without masses or tenderness  Anus: Normal   Assessment/Plan:  53 y.o. female for annual exam   1. Encounter for Papanicolaou smear of vagina as part of routine gynecological examination Gynecologic exam status post total hysterectomy and menopause.  History of  cervical dysplasia and VAIN 1.  Pap reflex done on the vaginal vault.  2. History of total hysterectomy  3. VAIN I (vaginal intraepithelial neoplasia grade I) History of VAIN 1 with CO2 laser in 2013.  Last Pap test was negative in June 2019 with negative high-risk HPV.  4. Menopausal syndrome Moderate to severe hot flashes with night sweats daily for about 2 months.  Status post total hysterectomy.  Hormone replacement therapy discussed.  Usage, risks and  benefits thoroughly reviewed.  No contraindication to start on estradiol patch.  Estradiol patch 0.05 twice a week prescription sent to pharmacy.  5. Class 1 obesity due to excess calories without serious comorbidity with body mass index (BMI) of 31.0 to 31.9 in adult Lower calorie/carb diet such as [employer] discussed and recommended.  Regular fitness activities with aerobic activities 5 times a week and weightlifting every 2 days recommended.  Other orders - estradiol (VIVELLE-DOT) 0.05 MG/24HR patch; Place 1 patch (0.05 mg total) onto the skin 2 (two) times a week.  Princess Bruins MD, 3:38 PM 09/13/2018

## 2018-09-14 ENCOUNTER — Other Ambulatory Visit: Payer: Self-pay

## 2018-09-14 ENCOUNTER — Ambulatory Visit
Admission: RE | Admit: 2018-09-14 | Discharge: 2018-09-14 | Disposition: A | Discharge status: Home / Self Care | Payer: Commercial Managed Care - PPO | Source: Ambulatory Visit | Attending: Obstetrics & Gynecology | Admitting: Obstetrics & Gynecology

## 2018-09-14 DIAGNOSIS — Z1231 Encounter for screening mammogram for malignant neoplasm of breast: Secondary | ICD-10-CM

## 2018-09-14 LAB — PAP IG W/ RFLX HPV ASCU

## 2018-09-15 ENCOUNTER — Encounter: Payer: Self-pay | Admitting: *Deleted

## 2018-09-19 ENCOUNTER — Encounter: Payer: Self-pay | Admitting: Obstetrics & Gynecology

## 2018-09-19 NOTE — Patient Instructions (Signed)
1. Encounter for Papanicolaou smear of vagina as part of routine gynecological examination Gynecologic exam status post total hysterectomy and menopause.  History of cervical dysplasia and VAIN 1.  Pap reflex done on the vaginal vault.  2. History of total hysterectomy  3. VAIN I (vaginal intraepithelial neoplasia grade I) History of VAIN 1 with CO2 laser in 2013.  Last Pap test was negative in June 2019 with negative high-risk HPV.  4. Menopausal syndrome Moderate to severe hot flashes with night sweats daily for about 2 months.  Status post total hysterectomy.  Hormone replacement therapy discussed.  Usage, risks and benefits thoroughly reviewed.  No contraindication to start on estradiol patch.  Estradiol patch 0.05 twice a week prescription sent to pharmacy.  5. Class 1 obesity due to excess calories without serious comorbidity with body mass index (BMI) of 31.0 to 31.9 in adult Lower calorie/carb diet such as [employer] discussed and recommended.  Regular fitness activities with aerobic activities 5 times a week and weightlifting every 2 days recommended.  Other orders - estradiol (VIVELLE-DOT) 0.05 MG/24HR patch; Place 1 patch (0.05 mg total) onto the skin 2 (two) times a week.  Bethann Humble un placer verle hoy!  Voy a informarle de sus [employer].

## 2018-10-19 ENCOUNTER — Telehealth: Payer: Self-pay

## 2018-10-19 NOTE — Telephone Encounter (Signed)

## 2018-10-20 ENCOUNTER — Encounter: Payer: Self-pay | Admitting: Family Medicine

## 2018-10-20 ENCOUNTER — Encounter: Payer: Self-pay | Admitting: Gastroenterology

## 2018-10-20 ENCOUNTER — Ambulatory Visit (INDEPENDENT_AMBULATORY_CARE_PROVIDER_SITE_OTHER): Payer: Commercial Managed Care - PPO | Admitting: Family Medicine

## 2018-10-20 ENCOUNTER — Other Ambulatory Visit: Payer: Self-pay

## 2018-10-20 VITALS — BP 124/80 | HR 75 | Ht 60.0 in | Wt 161.2 lb

## 2018-10-20 DIAGNOSIS — Z0000 Encounter for general adult medical examination without abnormal findings: Secondary | ICD-10-CM

## 2018-10-20 DIAGNOSIS — S39012A Strain of muscle, fascia and tendon of lower back, initial encounter: Secondary | ICD-10-CM | POA: Insufficient documentation

## 2018-10-20 DIAGNOSIS — E559 Vitamin D deficiency, unspecified: Secondary | ICD-10-CM

## 2018-10-20 DIAGNOSIS — E6609 Other obesity due to excess calories: Secondary | ICD-10-CM

## 2018-10-20 DIAGNOSIS — E66811 Obesity, class 1: Secondary | ICD-10-CM

## 2018-10-20 DIAGNOSIS — L811 Chloasma: Secondary | ICD-10-CM

## 2018-10-20 DIAGNOSIS — Z6830 Body mass index (BMI) 30.0-30.9, adult: Secondary | ICD-10-CM | POA: Diagnosis not present

## 2018-10-20 DIAGNOSIS — E7800 Pure hypercholesterolemia, unspecified: Secondary | ICD-10-CM | POA: Diagnosis not present

## 2018-10-20 LAB — LIPID PANEL
Cholesterol: 168 mg/dL (ref 0–200)
HDL: 51 mg/dL (ref >39.00)
LDL Cholesterol: 91 mg/dL (ref 0–99)
NonHDL: 117.43
Total CHOL/HDL Ratio: 3
Triglycerides: 133 mg/dL (ref 0.0–149.0)
VLDL: 26.6 mg/dL (ref 0.0–40.0)

## 2018-10-20 LAB — URINALYSIS, ROUTINE W REFLEX MICROSCOPIC
Bilirubin Urine: NEGATIVE
Ketones, ur: NEGATIVE
Leukocytes,Ua: NEGATIVE
Nitrite: NEGATIVE
RBC / HPF: NONE SEEN (ref 0–?)
Specific Gravity, Urine: <=1.005 — AB (ref 1.000–1.030)
Total Protein, Urine: NEGATIVE
Urine Glucose: NEGATIVE
Urobilinogen, UA: 0.2 (ref 0.0–1.0)
pH: 6 (ref 5.0–8.0)

## 2018-10-20 LAB — COMPREHENSIVE METABOLIC PANEL
ALT: 51 U/L — ABNORMAL HIGH (ref 0–35)
AST: 41 U/L — ABNORMAL HIGH (ref 0–37)
Albumin: 4.3 g/dL (ref 3.5–5.2)
Alkaline Phosphatase: 58 U/L (ref 39–117)
BUN: 11 mg/dL (ref 6–23)
CO2: 27 mEq/L (ref 19–32)
Calcium: 9.6 mg/dL (ref 8.4–10.5)
Chloride: 106 mEq/L (ref 96–112)
Creatinine, Ser: 0.63 mg/dL (ref 0.40–1.20)
GFR: 99.72 mL/min (ref >60.00)
Glucose, Bld: 88 mg/dL (ref 70–99)
Potassium: 4.3 mEq/L (ref 3.5–5.1)
Sodium: 139 mEq/L (ref 135–145)
Total Bilirubin: 0.9 mg/dL (ref 0.2–1.2)
Total Protein: 6.9 g/dL (ref 6.0–8.3)

## 2018-10-20 LAB — CBC
HCT: 39.5 % (ref 36.0–46.0)
Hemoglobin: 13.1 g/dL (ref 12.0–15.0)
MCHC: 33.1 g/dL (ref 30.0–36.0)
MCV: 98.2 fl (ref 78.0–100.0)
Platelets: 208 10*3/uL (ref 150.0–400.0)
RBC: 4.03 Mil/uL (ref 3.87–5.11)
RDW: 13.7 % (ref 11.5–15.5)
WBC: 6.2 10*3/uL (ref 4.0–10.5)

## 2018-10-20 LAB — LDL CHOLESTEROL, DIRECT: Direct LDL: 106 mg/dL

## 2018-10-20 LAB — VITAMIN D 25 HYDROXY (VIT D DEFICIENCY, FRACTURES): VITD: 22.58 ng/mL — ABNORMAL LOW (ref 30.00–100.00)

## 2018-10-20 MED ORDER — HYDROQUINONE 2 % EX CREA: TOPICAL_CREAM | Freq: Two times a day (BID) | CUTANEOUS | 1 refills | Status: DC

## 2018-10-20 MED ORDER — VITAMIN D3 50 MCG (2000 UT) PO CAPS: 2000.0000 [IU] | ORAL_CAPSULE | Freq: Every day | ORAL | 1 refills | Status: DC

## 2018-10-20 MED ORDER — METHOCARBAMOL 500 MG PO TABS: 500.0000 mg | ORAL_TABLET | Freq: Three times a day (TID) | ORAL | 0 refills | Status: DC | PRN

## 2018-10-20 NOTE — Addendum Note (Signed)
Addended by: Jon Billings on: 10/20/2018 03:19 PM   Modules accepted: Orders

## 2018-10-20 NOTE — Progress Notes (Addendum)
Established Patient Office Visit  Subjective:  Patient ID: Natalie Martinez, female    DOB: 1965/09/03  Age: 53 y.o. MRN: [id number]  CC:  Chief Complaint  Patient presents with  . Annual Exam    HPI Natalie Martinez presents for a complete physical and follow-up of her elevated cholesterol.  She is also having some other issues she would like to discuss today.  She is living a healthy lifestyle.  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  She lives at home with her 2 year old daughter who is been doing well.  Patient exercises daily by walking.  She needs to schedule an appointment with her dentist.  She has not had a colonoscopy or recent eye exam.  She is up-to-date with her female exam and mammograms per GYN.  She is taking the Pravachol nightly without issue.  She has some dark spots on her forearm she would like for me to take a look at.  She has been having some pain in her right lower back with stiffness.  She did see the rheumatologist who prescribed Voltaren for her.  Past Medical History:  Diagnosis Date  . Allergy   . Hypercholesterolemia   . NSVD (normal spontaneous vaginal delivery)    X3  . Vertigo     Past Surgical History:  Procedure Laterality Date  . CERVICAL BIOPSY  W/ LOOP ELECTRODE EXCISION  2004  &  2005   X 2  . [place] OF UTERUS  2000 & 2003  . VAGINAL HYSTERECTOMY  2005   RECURRENT CERVICAL DYSPLASIA    Family History  Problem Relation Age of Onset  . Diabetes Mother   . Breast cancer Neg Hx     Social History   Socioeconomic History  . Marital status: Single    Spouse name: Not on file  . Number of children: 3  . Years of education: 54  . Highest education level: Not on file  Occupational History  . Occupation: Glass blower/designer  Social Needs  . Financial resource strain: Not on file  . Food insecurity    Worry: Not on file    Inability: Not on file  . Transportation needs    Medical: Not on file   Non-medical: Not on file  Tobacco Use  . Smoking status: Never Smoker  . Smokeless tobacco: Never Used  Substance and Sexual Activity  . Alcohol use: No    Alcohol/week: 0.0 standard drinks  . Drug use: No  . Sexual activity: Yes    Birth control/protection: None, Surgical    Comment: 1st intercourse- 25, partners- 3,   Lifestyle  . Physical activity    Days per week: Not on file    Minutes per session: Not on file  . Stress: Not on file  Relationships  . Social Herbalist on phone: Not on file    Gets together: Not on file    Attends religious service: Not on file    Active member of club or organization: Not on file    Attends meetings of clubs or organizations: Not on file    Relationship status: Not on file  . Intimate partner violence    Fear of current or ex partner: Not on file    Emotionally abused: Not on file    Physically abused: Not on file    Forced sexual activity: Not on file  Other Topics Concern  . Not on file  Social History Narrative  Lives at home with her daughter and her mother.   Occasional caffeine use (coffee twice weekly).   Right-handed.    Outpatient Medications Prior to Visit  Medication Sig Dispense Refill  . diclofenac (VOLTAREN) 75 MG EC tablet Take 75 mg by mouth 2 (two) times daily.    Marland Kitchen estradiol (VIVELLE-DOT) 0.05 MG/24HR patch Place 1 patch (0.05 mg total) onto the skin 2 (two) times a week. 24 patch 4  . simvastatin (ZOCOR) 40 MG tablet TAKE 1 TABLET(40 MG) BY MOUTH AT BEDTIME 90 tablet 1   No facility-administered medications prior to visit.     No Known Allergies  ROS Review of Systems  Constitutional: Negative for diaphoresis, fatigue, fever and unexpected weight change.  HENT: Negative.   Eyes: Negative for photophobia and visual disturbance.  Respiratory: Negative.   Cardiovascular: Negative.   Gastrointestinal: Negative.   Endocrine: Negative for polyphagia and polyuria.  Genitourinary: Negative for  difficulty urinating, dysuria and frequency.  Musculoskeletal: Positive for back pain and myalgias.  Skin: Positive for rash. Negative for wound.  Allergic/Immunologic: Negative for immunocompromised state.  Neurological: Negative for seizures, light-headedness and numbness.  Hematological: Does not bruise/bleed easily.  Psychiatric/Behavioral: Negative.       Objective:    Physical Exam  Constitutional: She is oriented to person, place, and time. She appears well-developed and well-nourished. No distress.  HENT:  Head: Normocephalic and atraumatic.  Right Ear: External ear normal.  Left Ear: External ear normal.  Mouth/Throat: Oropharynx is clear and moist. No oropharyngeal exudate.  Eyes: Pupils are equal, round, and reactive to light. Conjunctivae are normal. Right eye exhibits no discharge. Left eye exhibits no discharge. No scleral icterus.  Neck: Neck supple. No JVD present. No tracheal deviation present. No thyromegaly present.  Cardiovascular: Normal rate, regular rhythm and normal heart sounds.  Pulmonary/Chest: Effort normal and breath sounds normal. No stridor.  Abdominal: Soft. Bowel sounds are normal. She exhibits no distension. There is no abdominal tenderness. There is no rebound and no guarding.  Musculoskeletal:     Right hip: She exhibits normal range of motion and no tenderness.     Left hip: She exhibits normal range of motion and no tenderness.     Lumbar back: She exhibits decreased range of motion. She exhibits no tenderness, no bony tenderness and no spasm.     Left lower leg: She exhibits no tenderness, no bony tenderness and no swelling.  Lymphadenopathy:    She has no cervical adenopathy.  Neurological: She is alert and oriented to person, place, and time. She has normal strength.  Negative dural tension signs.   Skin: Skin is warm and dry. She is not diaphoretic.  Psychiatric: She has a normal mood and affect. Her behavior is normal.    BP 124/80    Pulse 75   Ht 5' (1.524 m)   Wt 161 lb 4 oz (73.1 kg)   SpO2 98%   BMI 31.49 kg/m  Wt Readings from Last 3 Encounters:  10/20/18 161 lb 4 oz (73.1 kg)  09/13/18 162 lb (73.5 kg)  05/26/18 162 lb 3.2 oz (73.6 kg)   BP Readings from Last 3 Encounters:  10/20/18 124/80  09/13/18 118/78  05/26/18 102/70   Guideline developer:  UpToDate (see UpToDate for funding source) Date Released: June 2014  Health Maintenance Due  Topic Date Due  . COLONOSCOPY  01/16/2018    There are no preventive care reminders to display for this patient.  Lab Results  Component  Value Date   TSH 1.70 06/21/2017   Lab Results  Component Value Date   WBC 6.2 10/20/2018   HGB 13.1 10/20/2018   HCT 39.5 10/20/2018   MCV 98.2 10/20/2018   PLT 208.0 10/20/2018   Lab Results  Component Value Date   NA 139 10/20/2018   K 4.3 10/20/2018   CO2 27 10/20/2018   GLUCOSE 88 10/20/2018   BUN 11 10/20/2018   CREATININE 0.63 10/20/2018   BILITOT 0.9 10/20/2018   ALKPHOS 58 10/20/2018   AST 41 (H) 10/20/2018   ALT 51 (H) 10/20/2018   PROT 6.9 10/20/2018   ALBUMIN 4.3 10/20/2018   CALCIUM 9.6 10/20/2018   GFR 99.72 10/20/2018   Lab Results  Component Value Date   CHOL 168 10/20/2018   Lab Results  Component Value Date   HDL 51.00 10/20/2018   Lab Results  Component Value Date   LDLCALC 91 10/20/2018   Lab Results  Component Value Date   TRIG 133.0 10/20/2018   Lab Results  Component Value Date   CHOLHDL 3 10/20/2018   No results found for: HGBA1C    Assessment & Plan:   Problem List Items Addressed This Visit      Musculoskeletal and Integument   Idiopathic chloasma   Relevant Medications   hydroquinone 2 % cream   Strain of lumbar region   Relevant Medications   methocarbamol (ROBAXIN) 500 MG tablet     Other   Hypercholesterolemia - Primary   Relevant Orders   Comprehensive metabolic panel (Completed)   Lipid panel (Completed)   LDL cholesterol, direct (Completed)    Healthcare maintenance   Relevant Orders   CBC (Completed)   Urinalysis, Routine w reflex microscopic (Completed)   Ambulatory referral to Gastroenterology   Ambulatory referral to Ophthalmology   Class 1 obesity due to excess calories without serious comorbidity with body mass index (BMI) of 30.0 to 30.9 in adult   Relevant Orders   VITAMIN D 25 Hydroxy (Vit-D Deficiency, Fractures) (Completed)   Amb ref to Medical Nutrition Therapy-MNT    Other Visit Diagnoses    Vitamin D deficiency       Relevant Medications   Cholecalciferol (VITAMIN D3) 50 MCG (2000 UT) capsule      Meds ordered this encounter  Medications  . hydroquinone 2 % cream    Sig: Apply topically 2 (two) times daily.    Dispense:  28.35 g    Refill:  1  . methocarbamol (ROBAXIN) 500 MG tablet    Sig: Take 1 tablet (500 mg total) by mouth every 8 (eight) hours as needed for muscle spasms.    Dispense:  40 tablet    Refill:  0  . Cholecalciferol (VITAMIN D3) 50 MCG (2000 UT) capsule    Sig: Take 1 capsule (2,000 Units total) by mouth daily.    Dispense:  90 capsule    Refill:  1    Follow-up: Return in about 6 months (around 04/22/2019), or if symptoms worsen or fail to improve.    Patient was given information on health maintenance and exercising to lose weight.  She agrees to go for nutritional counseling, consultation for colonoscopy and an eye exam.

## 2018-10-20 NOTE — Patient Instructions (Signed)
Health Maintenance for Postmenopausal Women Menopause is a normal process in which your ability to get pregnant comes to an end. This process happens slowly over many months or years, usually between the ages of 48 and 55. Menopause is complete when you have missed your menstrual periods for 12 months. It is important to talk with your health care provider about some of the most common conditions that affect women after menopause (postmenopausal women). These include heart disease, cancer, and bone loss (osteoporosis). Adopting a healthy lifestyle and getting preventive care can help to promote your health and wellness. The actions you take can also lower your chances of developing some of these common conditions. What should I know about menopause? During menopause, you may get a number of symptoms, such as:  Hot flashes. These can be moderate or severe.  Night sweats.  Decrease in sex drive.  Mood swings.  Headaches.  Tiredness.  Irritability.  Memory problems.  Insomnia. Choosing to treat or not to treat these symptoms is a decision that you make with your health care provider. Do I need hormone replacement therapy?  Hormone replacement therapy is effective in treating symptoms that are caused by menopause, such as hot flashes and night sweats.  Hormone replacement carries certain risks, especially as you become older. If you are thinking about using estrogen or estrogen with progestin, discuss the benefits and risks with your health care provider. What is my risk for heart disease and stroke? The risk of heart disease, heart attack, and stroke increases as you age. One of the causes may be a change in the body's hormones during menopause. This can affect how your body uses dietary fats, triglycerides, and cholesterol. Heart attack and stroke are medical emergencies. There are many things that you can do to help prevent heart disease and stroke. Watch your blood pressure  High  blood pressure causes heart disease and increases the risk of stroke. This is more likely to develop in people who have high blood pressure readings, are of African descent, or are overweight.  Have your blood pressure checked: ? Every 3-5 years if you are 18-39 years of age. ? Every year if you are 40 years old or older. Eat a healthy diet   Eat a diet that includes plenty of vegetables, fruits, low-fat dairy products, and lean protein.  Do not eat a lot of foods that are high in solid fats, added sugars, or sodium. Get regular exercise Get regular exercise. This is one of the most important things you can do for your health. Most adults should:  Try to exercise for at least 150 minutes each week. The exercise should increase your heart rate and make you sweat (moderate-intensity exercise).  Try to do strengthening exercises at least twice each week. Do these in addition to the moderate-intensity exercise.  Spend less time sitting. Even light physical activity can be beneficial. Other tips  Work with your health care provider to achieve or maintain a healthy weight.  Do not use any products that contain nicotine or tobacco, such as cigarettes, e-cigarettes, and chewing tobacco. If you need help quitting, ask your health care provider.  Know your numbers. Ask your health care provider to check your cholesterol and your blood sugar (glucose). Continue to have your blood tested as directed by your health care provider. Do I need screening for cancer? Depending on your health history and family history, you may need to have cancer screening at different stages of your life. This   may include screening for:  Breast cancer.  Cervical cancer.  Lung cancer.  Colorectal cancer. What is my risk for osteoporosis? After menopause, you may be at increased risk for osteoporosis. Osteoporosis is a condition in which bone destruction happens more quickly than new bone creation. To help prevent  osteoporosis or the bone fractures that can happen because of osteoporosis, you may take the following actions:  If you are 75-49 years old, get at least 1,000 mg of calcium and at least 600 mg of vitamin D per day.  If you are older than age 27 but younger than age 13, get at least 1,200 mg of calcium and at least 600 mg of vitamin D per day.  If you are older than age 60, get at least 1,200 mg of calcium and at least 800 mg of vitamin D per day. Smoking and drinking excessive alcohol increase the risk of osteoporosis. Eat foods that are rich in calcium and vitamin D, and do weight-bearing exercises several times each week as directed by your health care provider. How does menopause affect my mental health? Depression may occur at any age, but it is more common as you become older. Common symptoms of depression include:  Low or sad mood.  Changes in sleep patterns.  Changes in appetite or eating patterns.  Feeling an overall lack of motivation or enjoyment of activities that you previously enjoyed.  Frequent crying spells. Talk with your health care provider if you think that you are experiencing depression. General instructions See your health care provider for regular wellness exams and vaccines. This may include:  Scheduling regular health, dental, and eye exams.  Getting and maintaining your vaccines. These include: ? Influenza vaccine. Get this vaccine each year before the flu season begins. ? Pneumonia vaccine. ? Shingles vaccine. ? Tetanus, diphtheria, and pertussis (Tdap) booster vaccine. Your health care provider may also recommend other immunizations. Tell your health care provider if you have ever been abused or do not feel safe at home. Summary  Menopause is a normal process in which your ability to get pregnant comes to an end.  This condition causes hot flashes, night sweats, decreased interest in sex, mood swings, headaches, or lack of sleep.  Treatment for this  condition may include hormone replacement therapy.  Take actions to keep yourself healthy, including exercising regularly, eating a healthy diet, watching your weight, and checking your blood pressure and blood sugar levels.  Get screened for cancer and depression. Make sure that you are up to date with all your vaccines. This information is not intended to replace advice given to you by your health care provider. Make sure you discuss any questions you have with your health care provider. Document Released: 05/01/2005 Document Revised: 03/02/2018 Document Reviewed: 03/02/2018 Elsevier Patient Education  2020 [place] Maintenance for Postmenopausal Women La menopausia es un proceso normal en el cual la capacidad de quedar embarazada llega a su fin. Este proceso ocurre lentamente a lo largo de un perodo de muchos meses o aos; por lo general, entre los 5 y los 36aos. La menopausia es completa cuando no se ha tenido el perodo menstrual por 69meses. Es importante hablar con el mdico sobre algunas de las enfermedades ms comunes que afectan a las mujeres despus de la menopausia (mujeres posmenopusicas). Estas incluyen la enfermedad cardaca, el cncer y la prdida sea (osteoporosis). Adoptar un estilo de vida saludable y recibir atencin preventiva pueden  ayudar a Teacher, English as a foreign language y Musician. Las medidas que tome tambin pueden reducir las probabilidades de Actor algunas de estas enfermedades frecuentes. Qu debo saber acerca de la menopausia? Durante la menopausia, puede tener una serie de sntomas, por ejemplo:  Acaloramiento. Estos pueden ser moderados o intensos.  Sudoracin nocturna.  Disminucin del deseo sexual.  Cambios en el estado de nimo.  Dolores de [place].  Cansancio.  Irritabilidad.  Problemas de memoria.  Insomnio. Tratar o no estos sntomas es una decisin que se toma con el  mdico. Necesito terapia de reemplazo hormonal?  La terapia de reemplazo hormonal es eficaz para tratar los sntomas causados por la menopausia, como los acaloramientos y las sudoraciones nocturnas.  La reposicin hormonal conlleva ciertos riesgos, especialmente a medida que una mujer envejece. Si est pensando en usar estrgeno o estrgeno con progestina, analice los beneficios y los riesgos con el mdico. Cul es mi riesgo de sufrir enfermedad cardaca y accidente cerebrovascular? A medida que se envejece, aumenta el riesgo de enfermedad cardaca, infarto de miocardio y accidente cerebrovascular. Una de las causas puede ser un cambio en las hormonas del cuerpo durante la menopausia. Esto puede afectar la forma en que el organismo procesa las [place], los triglicridos y el colesterol de su dieta. El infarto de miocardio y el accidente cerebrovascular son emergencias mdicas. Hay muchas cosas que se pueden hacer para ayudar a prevenir la enfermedad cardaca y el accidente cerebrovascular. Contrlese la presin arterial  La hipertensin arterial causa enfermedades cardacas y [place] el riesgo de accidente cerebrovascular. Es ms probable que esto se manifieste en las personas que tienen lecturas de presin arterial alta, tienen ascendencia africana o tienen sobrepeso.  Hgase controlar la presin arterial: ? Cada 3 a 5 aos si tiene entre 18 y 70 aos. ? Todos los aos si es mayor de [place]. Consuma una dieta saludable   Consuma una dieta que incluya muchas verduras, frutas, productos lcteos con bajo contenido de [place] y Advertising account planner.  No consuma muchos alimentos ricos en grasas slidas, azcares agregados o sodio. Haga ejercicio con regularidad Haga ejercicio con regularidad. Esta es una de las prcticas ms importantes que puede hacer por su salud. La mayora de los adultos deben seguir estas pautas:  Intente realizar al menos 159minutos de actividad fsica por semana. El ejercicio  debe aumentar la frecuencia cardaca y Nature conservation officer transpirar (ejercicio de intensidad moderada).  Intente hacer ejercicios de elongacin por lo menos dos veces por semana. Agrguelos al plan de ejercicio de intensidad moderada.  Pasar menos tiempo sentados. Incluso la actividad fsica ligera puede ser beneficiosa. Otros consejos  Trabaje con su mdico para Science writer o Theatre manager un peso saludable.  No consuma ningn producto que contenga nicotina o tabaco, como cigarrillos, cigarrillos electrnicos y tabaco de Higher education careers adviser. Si necesita ayuda para dejar de fumar, consulte al mdico.  Conozca sus cifras. Pdale al mdico que le controle el colesterol y el nivel sanguneo de azcar en la sangre (glucosa). Siga hacindose anlisis de [employer] se lo haya indicado el mdico. Necesito realizarme pruebas de deteccin del cncer? Segn su historia clnica y sus antecedentes familiares, es posible que deba realizarse pruebas de deteccin del cncer en diferentes etapas de la vida. Esto puede incluir pruebas de deteccin de lo siguiente:  Cncer de mama.  Cncer de cuello uterino.  Cncer de pulmn.  Cncer colorrectal. Cul es mi riesgo de tener osteoporosis? Despus de la menopausia, puede correr un riesgo ms alto de tener osteoporosis. La osteoporosis es  una afeccin en la cual la destruccin de la masa sea ocurre con mayor rapidez que su formacin. Para ayudar a prevenir esta afeccin o las fracturas seas que pueden ocurrir a causa de [place], usted puede tomar las siguientes medidas:  Si tiene entre 19 y 50aos, tome como mnimo 1000mg  de calcio y 600mg  de vitaminaD por Training and development officer.  Si es mayor de 50aos pero menor de 70aos, tome como mnimo 1200mg  de calcio y 600mg  de vitaminaD por Training and development officer.  Si es mayor de 70aos, tome como mnimo 1200mg  de calcio y 800mg  de vitaminaD por Training and development officer. Fumar y beber alcohol en exceso aumentan el riesgo de osteoporosis. Consuma alimentos ricos en calcio y vitaminaD, y  haga ejercicios con soporte de peso varias veces a la semana, como se lo haya indicado el mdico. De qu manera la menopausia afecta mi salud mental? La depresin puede presentarse a cualquier edad, pero es ms frecuente a medida que una persona envejece. Los sntomas comunes de depresin incluyen lo siguiente:  Desnimo o tristeza.  Cambios en los patrones de sueo.  Cambios en el apetito o en los hbitos de alimentacin.  Sensacin de falta general de motivacin o placer al [employer] actividades que sola disfrutar.  Crisis frecuentes de llanto. Hable con el mdico si cree que tiene depresin. Instrucciones generales Visite a su mdico para hacerse exmenes de bienestar peridicos y aplicarse vacunas. Puede incluir:  Programar exmenes peridicos dentales, de la salud y de Public librarian.  Recibir y [employer]. Estos incluyen los siguientes: ? Human resources officer. Aplquese esta vacuna todos los aos antes de que comience la temporada de gripe. ? Vacuna contra la neumona. ? Vacuna contra el herpes. ? Vacuna contra el ttanos, la difteria y la tos Dyann Ruddle (Tdap). El mdico tambin puede recomendarle que se aplique otras vacunas. Notifique a su mdico si alguna vez ha sido vctima de abuso o si no se siente seguro en su hogar. Resumen  La menopausia es un proceso normal en el cual la capacidad de quedar embarazada llega a su fin.  Esta condicin causa acaloramientos, sudoraciones nocturnas, disminucin del inters en el sexo, cambios en el estado de nimo, dolores de [place] o falta de sueo.  El tratamiento de esta afeccin puede incluir una terapia de reemplazo hormonal.  Tome medidas para mantenerse 36, entre ellas, hacer ejercicio con regularidad, seguir una dieta saludable, controlar su peso y medirse la presin arterial y los niveles de Dispensing optician.  Hgase pruebas para Film/video editor y depresin. Asegrese de estar al da con todas las vacunas. Esta  informacin no tiene Marine scientist el consejo del mdico. Asegrese de hacerle al mdico cualquier pregunta que tenga. Document Released: 12/28/2012 Document Revised: 03/30/2018 Document Reviewed: 03/30/2018 Elsevier Patient Education  2020 [place] para bajar de peso Exercising to [employer] El ejercicio es la actividad fsica estructurada y repetitiva que se realiza para mejorar el [place] fsico y Technical sales engineer. Hacer ejercicio de forma regular es importante para todos. Es especialmente importante si tiene sobrepeso. El sobrepeso aumenta el riesgo de tener enfermedad cardaca, accidente cerebrovascular, diabetes, presin arterial alta y varios tipos de cncer. Reducir la ingesta de caloras y hacer ejercicio pueden ayudarlo a bajar de [place]. El ejercicio por lo general se clasifica como de intensidad moderada o vigorosa. Para bajar de peso, la [employer] de las personas debe hacer una determinada cantidad de ejercicio de intensidad moderada o vigorosa cada semana. Ejercicio de [employer]  El ejercicio de intensidad moderada es cualquier actividad que lo haga moverse lo suficiente como para quemar al menos tres veces ms energa (caloras) que si estuviera sentado. Algunos ejemplos de ejercicio de intensidad moderada son:  Caminar una milla (1,6 kilmetros) en 15 minutos.  Hacer trabajos de jardinera livianos.  Andar en bicicleta a un ritmo fcil de aguantar. La [employer] de las personas debe hacer al menos 150 minutos (2 horas y 30 minutos) por semana de ejercicio de intensidad moderada para Theatre manager su Engineer, site. Ejercicio de intensidad vigorosa El ejercicio de intensidad vigorosa es cualquier actividad que lo haga moverse lo suficiente como para quemar al menos seis veces ms caloras que si estuviera sentado. Al hacer ejercicio a esta intensidad, su nivel de esfuerzo debera ser lo suficientemente alto como para no permitirle Programmer, systems.  Algunos ejemplos de ejercicio de intensidad vigorosa son:  Optometrist.  Practicar un deporte de equipo, como ftbol americano, baloncesto y ftbol.  Saltar la cuerda. La [employer] de las personas debe hacer al menos 75 minutos (1 hora y 15 minutos) por semana de ejercicio de intensidad vigorosa para mantener su Engineer, site. Cmo puede afectarme el ejercicio? Cuando hace suficiente ejercicio como para quemar ms caloras que las que consume, pierde [place]. Tambin reduce la grasa corporal y aumenta la masa muscular. Cuanto ms msculo tenga, mayor cantidad de Nurse, children's. El ejercicio tambin:  Mejora el estado de nimo.  Reduce el estrs y las tensiones.  Mejora el estado fsico general, la flexibilidad y la resistencia.  Aumenta la fuerza sea. La cantidad de ejercicio que necesita realizar para bajar de peso depende de:  Su edad.  El tipo de ejercicio.  Cualquier afeccin de [employer].  Su capacidad fsica general. Pregntele al mdico cunto ejercicio debe realizar y qu tipos de actividades son seguras para usted. Qu medidas puedo tomar para bajar de peso? Nutricin   [employer] Financial dieta como se lo haya indicado el mdico o especialista en alimentacin y nutricin (nutricionista). Esto puede incluir lo siguiente: ? Consumir menos caloras. ? Consumir ms protenas. ? Consumir menos grasas no saludables. ? Seguir una dieta que incluya frutas y verduras frescas, cereales integrales, productos lcteos semidescremados y Advertising account planner. ? Evite los alimentos con grasa, sal y azcar agregadas.  Beba gran cantidad de agua mientras hace ejercicio para evitar la deshidratacin o los golpes de Freight forwarder. Actividad  Elija una actividad que disfrute y establezca objetivos realistas. El mdico puede ayudarlo a Paediatric nurse un plan de ejercicio que funcione para usted.  Haga ejercicio a una intensidad moderada o vigorosa la [employer] Financial de la [place]. ? La intensidad  de la actividad fsica puede variar de Ardelia Mems persona a Theatre manager. Puede saber qu tan intensa una rutina de ejercicios es para usted al Sales promotion account executive atencin a su respiracin y latidos cardacos. La [employer] de las personas notar que su respiracin y latidos cardacos se aceleran al Optometrist ejercicio de mayor intensidad.  Haga entrenamiento de resistencia dos veces por semana, como: ? Flexiones de Merrill Lynch. ? Abdominales. ? Levantamiento de pesas. ? Uso de bandas elsticas de resistencia.  Hacer ejercicio en perodos cortos de Estée [employer]uder ser tan til como los perodos largos y estructurados de ejercicio. Si tiene dificultad para encontrar tiempo para Engineer, site, trate de incluir el ejercicio en su rutina diaria. ? Levntese, estrese y camine cada 71minutos a lo largo del Training and development officer. ? Vaya a caminar durante su hora de almuerzo. ? Estacione el [employer]  lejos de su lugar de destino. ? Si [place] transporte pblico, bjese una parada antes y camine el resto del camino. ? Pngase de pie y camine cada vez que hable por telfono. ? Utilice la Writer del ascensor o la Civil engineer, contracting.  Use ropa cmoda y calzado con buen soporte.  No haga ejercicio en exceso que pudiera hacer que se lastime, se sienta mareado o tenga dificultad para respirar. Dnde buscar ms informacin  Departamento de Salud y Servicios Humanos de los Estados Unidos (U.S. Department of Health and [employer]): BondedCompany.at  Centros para el Control y la Prevencin de Probation officer for Disease Control and Prevention, CDC): http://www.wolf.info/ Comunquese con un mdico:  Antes de comenzar un nuevo programa de ejercicios.  Si tiene preguntas o inquietudes acerca de su peso.  Si tiene un problema mdico que Producer, television/film/video. Obtenga ayuda de inmediato si presenta alguno de los siguientes problemas al hacer ejercicio:  Lesiones.  Mareos.  Dificultad para respirar o falta de aire que no desaparecen al dejar de hacer  ejercicio.  Dolor en el pecho.  Latidos cardacos rpidos. Resumen  El sobrepeso aumenta el riesgo de tener enfermedad cardaca, accidente cerebrovascular, diabetes, presin arterial alta y varios tipos de cncer.  Para perder peso debe quemar ms caloras que las que consume.  Reducir la cantidad de caloras que consume, adems de hacer ejercicio de intensidad moderada o vigorosa todas las [place], [place] a Administrator, Civil Service. Esta informacin no tiene Marine scientist el consejo del mdico. Asegrese de hacerle al mdico cualquier pregunta que tenga. Document Released: 06/13/2010 Document Revised: 05/07/2017 Document Reviewed: 05/07/2017 Elsevier Patient Education  2020 [employer].

## 2018-10-24 ENCOUNTER — Encounter: Payer: Self-pay | Admitting: Nurse Practitioner

## 2018-10-24 ENCOUNTER — Telehealth (INDEPENDENT_AMBULATORY_CARE_PROVIDER_SITE_OTHER): Payer: Commercial Managed Care - PPO | Admitting: Nurse Practitioner

## 2018-10-24 DIAGNOSIS — M5441 Lumbago with sciatica, right side: Secondary | ICD-10-CM

## 2018-10-24 DIAGNOSIS — M461 Sacroiliitis, not elsewhere classified: Secondary | ICD-10-CM

## 2018-10-24 DIAGNOSIS — G8929 Other chronic pain: Secondary | ICD-10-CM | POA: Diagnosis not present

## 2018-10-24 MED ORDER — PREDNISONE 20 MG PO TABS: ORAL_TABLET | ORAL | 0 refills | Status: AC

## 2018-10-24 NOTE — Progress Notes (Signed)
Virtual Visit via Video Note  I connected with Natalie Martinez on 10/24/18 at  9:00 AM EDT by a video enabled telemedicine application and verified that I am speaking with the correct person using two identifiers.  Location: Patient: Home Provider: Office   I discussed the limitations of evaluation and management by telemedicine and the availability of in person appointments. The patient expressed understanding and agreed to proceed.  CC:Pt is C/O Hip pain Thursday and came to Dr. Ethelene Hal Friday. It was severe yesterday.  Today it is hard to walk. It is right sided and radiates to ankle. Robaxin and diclofenac are ineffective.  History of Present Illness: Hip Pain  The incident occurred more than 1 week ago. There was no injury mechanism. The pain is present in the right hip. The quality of the pain is described as aching and shooting. The pain has been constant since onset. Pertinent negatives include no inability to bear weight, loss of motion, muscle weakness, numbness or tingling. She reports no foreign bodies present. The symptoms are aggravated by movement, palpation and weight bearing. She has tried NSAIDs and rest (and robaxin) for the symptoms. The treatment provided no relief.  does not have f/up appt with rheumatologist. Denies any rash or fever or change in GI/GU function  Observations/Objective: Physical Exam  Constitutional: She is oriented to person, place, and time. No distress.  No vital signs to report  Pulmonary/Chest: Effort normal.  Neurological: She is alert and oriented to person, place, and time.  Observe ambulating around her home during video call   Assessment and Plan: [name] was seen today for hip pain.  Diagnoses and all orders for this visit:  Chronic right-sided low back pain with right-sided sciatica -     predniSONE (DELTASONE) 20 MG tablet; Take 2 tablets (40 mg total) by mouth daily with breakfast for 1 day, THEN 1.5 tablets (30 mg total) daily  with breakfast for 2 days, THEN 1 tablet (20 mg total) daily with breakfast for 2 days, THEN 0.5 tablets (10 mg total) daily with breakfast for 2 days.  Bilateral sacroiliitis (HCC) -     predniSONE (DELTASONE) 20 MG tablet; Take 2 tablets (40 mg total) by mouth daily with breakfast for 1 day, THEN 1.5 tablets (30 mg total) daily with breakfast for 2 days, THEN 1 tablet (20 mg total) daily with breakfast for 2 days, THEN 0.5 tablets (10 mg total) daily with breakfast for 2 days.    Follow Up Instructions: See avs   I discussed the assessment and treatment plan with the patient. The patient was provided an opportunity to ask questions and all were answered. The patient agreed with the plan and demonstrated an understanding of the instructions.   The patient was advised to call back or seek an in-person evaluation if the symptoms worsen or if the condition fails to improve as anticipated.    Wilfred Lacy, NP

## 2018-10-24 NOTE — Patient Instructions (Signed)
Continue robaxin Start oral prednisone. Hold voltaren while taking oral porednisone. May take tylenol 500mg  2tabs every 8hrs as needed. Schedule f/up appt with Rheumatology.

## 2018-11-01 ENCOUNTER — Ambulatory Visit (AMBULATORY_SURGERY_CENTER): Payer: Self-pay

## 2018-11-01 ENCOUNTER — Other Ambulatory Visit: Payer: Self-pay

## 2018-11-01 VITALS — Temp 97.7°F | Ht 62.0 in | Wt 161.0 lb

## 2018-11-01 DIAGNOSIS — Z1211 Encounter for screening for malignant neoplasm of colon: Secondary | ICD-10-CM

## 2018-11-01 MED ORDER — SUPREP BOWEL PREP KIT 17.5-3.13-1.6 GM/177ML PO SOLN: 1.0000 | Freq: Once | ORAL | 0 refills | Status: AC

## 2018-11-01 NOTE — Progress Notes (Signed)
Per pt, no allergies to soy or egg products.Pt not taking any weight loss meds or using  O2 at home.  Pt denies any sedation problems.  Pt refused emmi video.

## 2018-11-15 ENCOUNTER — Encounter: Payer: Self-pay | Admitting: Family Medicine

## 2018-11-15 ENCOUNTER — Ambulatory Visit: Payer: Commercial Managed Care - PPO | Admitting: Family Medicine

## 2018-11-15 ENCOUNTER — Ambulatory Visit (INDEPENDENT_AMBULATORY_CARE_PROVIDER_SITE_OTHER): Payer: Commercial Managed Care - PPO

## 2018-11-15 ENCOUNTER — Other Ambulatory Visit: Payer: Self-pay

## 2018-11-15 VITALS — BP 128/80 | HR 72 | Ht 62.0 in | Wt 163.0 lb

## 2018-11-15 DIAGNOSIS — R21 Rash and other nonspecific skin eruption: Secondary | ICD-10-CM

## 2018-11-15 DIAGNOSIS — L811 Chloasma: Secondary | ICD-10-CM | POA: Diagnosis not present

## 2018-11-15 DIAGNOSIS — E559 Vitamin D deficiency, unspecified: Secondary | ICD-10-CM

## 2018-11-15 DIAGNOSIS — M5416 Radiculopathy, lumbar region: Secondary | ICD-10-CM

## 2018-11-15 MED ORDER — KETOCONAZOLE 2 % EX CREA: 1.0000 "application " | TOPICAL_CREAM | Freq: Every day | CUTANEOUS | 0 refills | Status: DC

## 2018-11-15 MED ORDER — PREDNISONE 10 MG (48) PO TBPK: ORAL_TABLET | ORAL | 0 refills | Status: DC

## 2018-11-15 MED ORDER — VITAMIN D (ERGOCALCIFEROL) 1.25 MG (50000 UNIT) PO CAPS: 50000.0000 [IU] | ORAL_CAPSULE | ORAL | 5 refills | Status: DC

## 2018-11-15 MED ORDER — TRAMADOL HCL 50 MG PO TABS: ORAL_TABLET | ORAL | 0 refills | Status: DC

## 2018-11-15 NOTE — Progress Notes (Addendum)
Established Patient Office Visit  Subjective:  Patient ID: Natalie Martinez, female    DOB: [birth year]  Age: 53 y.o. MRN: [id number]  CC:  Chief Complaint  Patient presents with  . Hip Pain    right hip down to leg x 2 weeks, no known injury    HPI Natalie Martinez presents for evaluation of treatment of pain in her right hip area that has been moving down the side of her leg into her foot.  It is associated with paresthesias in the lateral part of her foot.  The pain is been waking her up at night.  She was seen about a month ago and given a course of prednisone but never received the prescription.  Bowel and bladder function are normal.  She is having no back pain at this time.  She did see the rheumatologist for sacroiliitis also says that she never received the vitamin D prescription.  Patient tells me that the hydroquinone cream did not help the rash on her arms.  She does not have this rash anywhere else on her body.  Past Medical History:  Diagnosis Date  . Allergy   . Hypercholesterolemia   . NSVD (normal spontaneous vaginal delivery)    X3  . Vertigo     Past Surgical History:  Procedure Laterality Date  . CERVICAL BIOPSY  W/ LOOP ELECTRODE EXCISION  2004  &  2005   X 2  . Hoskins OF UTERUS  2000 & 2003  . VAGINAL HYSTERECTOMY  2005   RECURRENT CERVICAL DYSPLASIA    Family History  Problem Relation Age of Onset  . Diabetes Mother   . Healthy Sister   . Healthy Brother   . Healthy Brother   . Breast cancer Neg Hx     Social History   Socioeconomic History  . Marital status: Single    Spouse name: Not on file  . Number of children: 3  . Years of education: 82  . Highest education level: Not on file  Occupational History  . Occupation: Glass blower/designer  Social Needs  . Financial resource strain: Not on file  . Food insecurity    Worry: Not on file    Inability: Not on file  . Transportation needs    Medical: Not on file   Non-medical: Not on file  Tobacco Use  . Smoking status: Never Smoker  . Smokeless tobacco: Never Used  Substance and Sexual Activity  . Alcohol use: No    Alcohol/week: 0.0 standard drinks  . Drug use: No  . Sexual activity: Yes    Birth control/protection: None, Surgical    Comment: 1st intercourse- 25, partners- 3,   Lifestyle  . Physical activity    Days per week: Not on file    Minutes per session: Not on file  . Stress: Not on file  Relationships  . Social Herbalist on phone: Not on file    Gets together: Not on file    Attends religious service: Not on file    Active member of club or organization: Not on file    Attends meetings of clubs or organizations: Not on file    Relationship status: Not on file  . Intimate partner violence    Fear of current or ex partner: Not on file    Emotionally abused: Not on file    Physically abused: Not on file    Forced sexual activity: Not on file  Other Topics Concern  . Not on file  Social History Narrative   Lives at home with her daughter and her mother.   Occasional caffeine use (coffee twice weekly).   Right-handed.    Outpatient Medications Prior to Visit  Medication Sig Dispense Refill  . Cholecalciferol (VITAMIN D3) 50 MCG (2000 UT) capsule Take 1 capsule (2,000 Units total) by mouth daily. (Patient not taking: Reported on 12/06/2018) 90 capsule 1  . diclofenac (VOLTAREN) 75 MG EC tablet Take 75 mg by mouth 2 (two) times daily.    Marland Kitchen estradiol (VIVELLE-DOT) 0.05 MG/24HR patch Place 1 patch (0.05 mg total) onto the skin 2 (two) times a week. 24 patch 4  . hydroquinone 2 % cream Apply topically 2 (two) times daily. 28.35 g 1  . simvastatin (ZOCOR) 40 MG tablet TAKE 1 TABLET(40 MG) BY MOUTH AT BEDTIME 90 tablet 1  . methocarbamol (ROBAXIN) 500 MG tablet Take 1 tablet (500 mg total) by mouth every 8 (eight) hours as needed for muscle spasms. (Patient not taking: Reported on 12/06/2018) 40 tablet 0   No  facility-administered medications prior to visit.     No Known Allergies  ROS Review of Systems  Constitutional: Negative.   Eyes: Negative for photophobia and visual disturbance.  Respiratory: Negative.   Cardiovascular: Negative.   Gastrointestinal: Negative.   Endocrine: Negative for polyphagia and polyuria.  Genitourinary: Negative.   Musculoskeletal: Negative for back pain and gait problem.  Skin: Positive for color change and rash.  Neurological: Positive for numbness. Negative for weakness.  Hematological: Does not bruise/bleed easily.  Psychiatric/Behavioral: Negative.       Objective:    Physical Exam  Constitutional: She is oriented to person, place, and time. She appears well-developed and well-nourished. No distress.  HENT:  Head: Normocephalic and atraumatic.  Right Ear: External ear normal.  Left Ear: External ear normal.  Eyes: Conjunctivae are normal. Right eye exhibits no discharge. Left eye exhibits no discharge. No scleral icterus.  Neck: No JVD present. No tracheal deviation present.  Pulmonary/Chest: Effort normal.  Musculoskeletal:     Lumbar back: She exhibits decreased range of motion. She exhibits no tenderness, no bony tenderness and no spasm.       Back:  Neurological: She is alert and oriented to person, place, and time. She has normal strength.  Reflex Scores:      Patellar reflexes are 2+ on the right side and 2+ on the left side.      Achilles reflexes are 1+ on the right side and 1+ on the left side. Neg dural tension signs.  Skin: Skin is warm and dry. She is not diaphoretic.  Psychiatric: She has a normal mood and affect. Her behavior is normal.    BP 128/80   Pulse 72   Ht 5\' 2"  (1.575 m)   Wt 163 lb (73.9 kg)   SpO2 97%   BMI 29.81 kg/m  Wt Readings from Last 3 Encounters:  12/06/18 162 lb 12.8 oz (73.8 kg)  11/15/18 163 lb (73.9 kg)  11/01/18 161 lb (73 kg)   BP Readings from Last 3 Encounters:  12/06/18 120/80  11/15/18  128/80  10/20/18 124/80   Guideline developer:  UpToDate (see UpToDate for funding source) Date Released: June 2014  There are no preventive care reminders to display for this patient.  There are no preventive care reminders to display for this patient.  Lab Results  Component Value Date   TSH 1.70 06/21/2017   Lab  Results  Component Value Date   WBC 6.2 10/20/2018   HGB 13.1 10/20/2018   HCT 39.5 10/20/2018   MCV 98.2 10/20/2018   PLT 208.0 10/20/2018   Lab Results  Component Value Date   NA 139 10/20/2018   K 4.3 10/20/2018   CO2 27 10/20/2018   GLUCOSE 88 10/20/2018   BUN 11 10/20/2018   CREATININE 0.63 10/20/2018   BILITOT 0.9 10/20/2018   ALKPHOS 58 10/20/2018   AST 41 (H) 10/20/2018   ALT 51 (H) 10/20/2018   PROT 6.9 10/20/2018   ALBUMIN 4.3 10/20/2018   CALCIUM 9.6 10/20/2018   GFR 99.72 10/20/2018   Lab Results  Component Value Date   CHOL 168 10/20/2018   Lab Results  Component Value Date   HDL 51.00 10/20/2018   Lab Results  Component Value Date   LDLCALC 91 10/20/2018   Lab Results  Component Value Date   TRIG 133.0 10/20/2018   Lab Results  Component Value Date   CHOLHDL 3 10/20/2018   No results found for: HGBA1C    Assessment & Plan:   Problem List Items Addressed This Visit      Nervous and Auditory   RESOLVED: Lumbar back pain with radiculopathy affecting right lower extremity - Primary   Relevant Medications   predniSONE (STERAPRED UNI-PAK 48 TAB) 10 MG (48) TBPK tablet   Other Relevant Orders   DG Lumbar Spine Complete (Completed)     Musculoskeletal and Integument   Idiopathic chloasma   Relevant Orders   Ambulatory referral to Dermatology     Other   Vitamin D deficiency   Relevant Medications   Vitamin D, Ergocalciferol, (DRISDOL) 1.25 MG (50000 UT) CAPS capsule    Other Visit Diagnoses    Rash, skin       Relevant Medications   ketoconazole (NIZORAL) 2 % cream      Meds ordered this encounter  Medications   . predniSONE (STERAPRED UNI-PAK 48 TAB) 10 MG (48) TBPK tablet    Sig: Pharmacy to instruct a 12-day Dosepak starting tomorrow.    Dispense:  48 tablet    Refill:  0  . DISCONTD: traMADol (ULTRAM) 50 MG tablet    Sig: Take one to two at night as needed for pain.    Dispense:  30 tablet    Refill:  0  . Vitamin D, Ergocalciferol, (DRISDOL) 1.25 MG (50000 UT) CAPS capsule    Sig: Take 1 capsule (50,000 Units total) by mouth every 7 (seven) days.    Dispense:  5 capsule    Refill:  5  . ketoconazole (NIZORAL) 2 % cream    Sig: Apply 1 application topically daily. For 2 weeks.    Dispense:  30 g    Refill:  0    Follow-up: Return in about 1 month (around 12/16/2018).   Have recommended an MRI but patient is unable to afford it at this time.  Hydroquinone did not help the rash on her arms.  Will try Nizoral cream daily for 2 weeks.  If this does not help that she will let me know and will see dermatology opinion.  12-day Dosepak with tramadol taken nightly.  Resent prescription for high-dose vitamin D.

## 2018-11-15 NOTE — Patient Instructions (Signed)
Dolor radicular Radicular Pain El dolor radicular es un tipo de dolor que se extiende desde la espalda o el cuello a lo largo del nervio raqudeo. Los nervios raqudeos se originan en la mdula espinal y llegan a los msculos. El dolor radicular a veces se conoce como radiculopata, radiculitis o pinzamiento de un nervio. Si tiene este tipo de Social research officer, government, tambin puede sentir debilidad, adormecimiento u hormigueo en la zona del cuerpo a la que llega ese nervio. El dolor puede ser agudo y sentirse como un ardor. Segn el nervio raqudeo que est afectado, el dolor puede aparecer en los siguientes lugares:  Zona del cuello (dolor radicular cervical). Tambin puede Education officer, environmental, adormecimiento, debilidad u hormigueo [employer].  Zona de la mitad de la columna (dolor radicular torcico). Se siente en la espalda y en el pecho. Este tipo es poco frecuente.  Zona inferior de la espalda (dolor radicular lumbar). Se siente un dolor lumbar. Tambin puede Education officer, environmental, adormecimiento, debilidad u hormigueo en las nalgas o en las piernas. La citica es un tipo de dolor radicular lumbar que se extiende por la parte de atrs de la pierna. El dolor radicular se produce cuando uno de los nervios espinales se irrita o se aprieta (comprime). A menudo se debe a algo que ejerce presin sobre un nervio espinal, como uno de los huesos de la columna vertebral (vrtebras) o una de las almohadillas redondas que estn entre las vrtebras (discos intervertebrales). Esto puede deberse a lo siguiente:  Una lesin.  El desgaste o el envejecimiento de un disco.  El crecimiento de un espoln seo que aprieta el [place]. El dolor radicular suele desaparecer cuando se siguen las indicaciones del mdico sobre cmo aliviarlo en la casa. Siga estas indicaciones en su casa: Control del dolor      Si se lo indican, aplique hielo sobre la zona afectada: ? Ponga el hielo en una bolsa plstica. ? Coloque una [employer] piel y Administrator. ? Coloque el hielo durante 42minutos, 2 a 3veces por da.  Si se lo indican, aplique calor en la zona afectada con la frecuencia que le haya indicado el mdico. Use la fuente de calor que el mdico le recomiende, como una compresa de calor hmedo o una almohadilla trmica. ? Coloque una [employer] piel y la fuente de Freight forwarder. ? Aplique calor durante 20 a 66minutos. ? Retire la fuente de calor si la piel se pone de color rojo brillante. Esto es especialmente importante si no puede sentir dolor, calor o fro. Puede correr un riesgo mayor de sufrir quemaduras. Actividad   No permanezca sentado o acostado durante largos perodos.  Intente mantenerse lo ms activo posible. Pregntele al mdico qu tipo de ejercicio o actividad es mejor para usted.  Evite las actividades que empeoren el dolor, como doblarse y Lexicographer objetos.  No levante ningn objeto que pese ms de 10libras (4,5kg) o el lmite de peso que le hayan indicado, hasta que el mdico le diga que puede [employer].  Use una tcnica adecuada al levantar objetos. La tcnica adecuada para levantar objetos consiste en doblar las rodillas y levantarse.  Haga ejercicios de fuerza y flexibilidad solamente como se lo haya indicado el mdico o el fisioterapeuta. Indicaciones generales  [employer] de venta libre y los recetados solamente como se lo haya indicado el mdico.  Est atento a cualquier [employer] sntomas.  Concurra a todas las visitas de seguimiento como se lo haya indicado el  mdico. Esto es importante. ? El mdico lo puede enviar a un fisioterapeuta para Microbiologist. Comunquese con un mdico si:  El dolor y otros sntomas empeoran.  El medicamento no Production designer, theatre/television/film.  El dolor no mejora despus de unas semanas de cuidado Financial planner.  Tiene fiebre. Solicite ayuda inmediatamente si:  Scientist, clinical (histocompatibility and immunogenetics), adormecimiento o debilidad intensos.  Tiene dificultad con el control de la vejiga o  los intestinos. Resumen  El dolor radicular es un tipo de dolor que se extiende desde la espalda o el cuello a lo largo del nervio raqudeo.  Si tiene dolor radicular, tambin puede sentir debilidad, adormecimiento u hormigueo en la zona del cuerpo a la que llega ese nervio.  El dolor puede ser agudo o sentirse como ardor.  El dolor radicular puede tratarse con hielo, calor, medicamentos o fisioterapia. Esta informacin no tiene Marine scientist el consejo del mdico. Asegrese de hacerle al mdico cualquier pregunta que tenga. Document Released: 03/09/2005 Document Revised: 11/10/2017 Document Reviewed: 11/10/2017 Elsevier Patient Education  2020 [employer].

## 2018-11-16 ENCOUNTER — Encounter: Payer: Commercial Managed Care - PPO | Admitting: Gastroenterology

## 2018-12-06 ENCOUNTER — Ambulatory Visit: Payer: Commercial Managed Care - PPO | Admitting: Emergency Medicine

## 2018-12-06 ENCOUNTER — Other Ambulatory Visit: Payer: Self-pay

## 2018-12-06 ENCOUNTER — Encounter: Payer: Self-pay | Admitting: Emergency Medicine

## 2018-12-06 VITALS — BP 120/80 | HR 66 | Temp 98.3°F | Resp 16 | Ht 62.0 in | Wt 162.8 lb

## 2018-12-06 DIAGNOSIS — M461 Sacroiliitis, not elsewhere classified: Secondary | ICD-10-CM | POA: Diagnosis not present

## 2018-12-06 DIAGNOSIS — M1280 Other specific arthropathies, not elsewhere classified, unspecified site: Secondary | ICD-10-CM

## 2018-12-06 DIAGNOSIS — Z23 Encounter for immunization: Secondary | ICD-10-CM | POA: Diagnosis not present

## 2018-12-06 DIAGNOSIS — M5416 Radiculopathy, lumbar region: Secondary | ICD-10-CM

## 2018-12-06 DIAGNOSIS — M5431 Sciatica, right side: Secondary | ICD-10-CM

## 2018-12-06 MED ORDER — METHYLPREDNISOLONE 4 MG PO TBPK: ORAL_TABLET | ORAL | 1 refills | Status: DC

## 2018-12-06 MED ORDER — TRAMADOL HCL 50 MG PO TABS: 50.0000 mg | ORAL_TABLET | Freq: Three times a day (TID) | ORAL | 0 refills | Status: AC | PRN

## 2018-12-06 MED ORDER — METHYLPREDNISOLONE ACETATE 80 MG/ML IJ SUSP
80.0000 mg | Freq: Once | INTRAMUSCULAR | Status: AC
  Administered 2018-12-06: 80 mg via INTRAMUSCULAR

## 2018-12-06 NOTE — Progress Notes (Signed)
[name] 53 y.o.   Chief Complaint  Patient presents with  . Hip Pain    RIGHT down the leg x 1 month    HISTORY OF PRESENT ILLNESS: This is a 53 y.o. female with history of bilateral sacroiliitis and chronic low back/hip pain complaining of increase pain to her right hip with radiation down the right leg and intermittent numbness and tingling of her right foot toes.  No bowel or bladder issues.  Pain worse with movement and better with rest.  No other associated symptoms.  Saw PCP 11/15/2018 for the same: Problem List Items Addressed This Visit            Nervous and Auditory    Lumbar back pain with radiculopathy affecting right lower extremity - Primary    Relevant Medications    predniSONE (STERAPRED UNI-PAK 48 TAB) 10 MG (48) TBPK tablet    traMADol (ULTRAM) 50 MG tablet    Other Relevant Orders    DG Lumbar Spine Complete        Musculoskeletal and Integument    Idiopathic chloasma        Other    Vitamin D deficiency    Relevant Medications    Vitamin D, Ergocalciferol, (DRISDOL) 1.25 MG (50000 UT) CAPS capsule           Other Visit Diagnoses    Rash, skin       Relevant Medications   ketoconazole (NIZORAL) 2 % cream          Meds ordered this encounter  Medications  . predniSONE (STERAPRED UNI-PAK 48 TAB) 10 MG (48) TBPK tablet    Sig: Pharmacy to instruct a 12-day Dosepak starting tomorrow.    Dispense:  48 tablet    Refill:  0  . traMADol (ULTRAM) 50 MG tablet    Sig: Take one to two at night as needed for pain.    Dispense:  30 tablet    Refill:  0  . Vitamin D, Ergocalciferol, (DRISDOL) 1.25 MG (50000 UT) CAPS capsule    Sig: Take 1 capsule (50,000 Units total) by mouth every 7 (seven) days.    Dispense:  5 capsule    Refill:  5  . ketoconazole (NIZORAL) 2 % cream    Sig: Apply 1 application topically daily. For 2 weeks.    Dispense:  30 g    Refill:  0    Follow-up: Return in about 1  month (around 12/16/2018).   Have recommended an MRI but patient is unable to afford it at this time.  Hydroquinone did not help the rash on her arms.  Will try Nizoral cream daily for 2 weeks.  If this does not help that she will let me know and will see dermatology opinion.  12-day Dosepak with tramadol taken nightly.  Resent prescription for high-dose vitamin D.    HPI   Prior to Admission medications   Medication Sig Start Date End Date Taking? Authorizing Provider  diclofenac (VOLTAREN) 75 MG EC tablet Take 75 mg by mouth 2 (two) times daily.   Yes [provider]  estradiol (VIVELLE-DOT) 0.05 MG/24HR patch Place 1 patch (0.05 mg total) onto the skin 2 (two) times a week. 09/15/18  Yes Princess Bruins, MD  hydroquinone 2 % cream Apply topically 2 (two) times daily. 10/20/18  Yes Libby Maw, MD  ketoconazole (NIZORAL) 2 % cream Apply 1 application topically daily. For 2 weeks. 11/15/18  Yes Libby Maw, MD  predniSONE (STERAPRED UNI-PAK 48 TAB) 10 MG (48) TBPK tablet Pharmacy to instruct a 12-day Dosepak starting tomorrow. 11/15/18  Yes Libby Maw, MD  simvastatin (ZOCOR) 40 MG tablet TAKE 1 TABLET(40 MG) BY MOUTH AT BEDTIME 02/07/18  Yes Libby Maw, MD  Vitamin D, Ergocalciferol, (DRISDOL) 1.25 MG (50000 UT) CAPS capsule Take 1 capsule (50,000 Units total) by mouth every 7 (seven) days. 11/15/18  Yes Libby Maw, MD  Cholecalciferol (VITAMIN D3) 50 MCG (2000 UT) capsule Take 1 capsule (2,000 Units total) by mouth daily. Patient not taking: Reported on 12/06/2018 10/20/18   Libby Maw, MD  methocarbamol (ROBAXIN) 500 MG tablet Take 1 tablet (500 mg total) by mouth every 8 (eight) hours as needed for muscle spasms. Patient not taking: Reported on 12/06/2018 10/20/18   Libby Maw, MD  traMADol Veatrice Bourbon) 50 MG tablet Take one to two at night as needed for pain. Patient not taking: Reported on 12/06/2018  11/15/18   Libby Maw, MD    No Known Allergies  Patient Active Problem List   Diagnosis Date Noted  . Vitamin D deficiency 11/15/2018  . 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 Idiopathic chloasma 11/02/2017  . Bilateral sacroiliitis ([place]) 11/02/2017  . Class 1 obesity due to excess calories without serious comorbidity with body mass index (BMI) of 30.0 to 30.9 in adult 11/02/2017  . History of condyloma acuminatum 06/21/2017  . Bone cyst of left tibia 06/21/2017  . Dyslipidemia with elevated low density lipoprotein (LDL) cholesterol and abnormally low high density lipoprotein cholesterol 12/18/2016  . Varicose veins of lower extremities with other complications 29/79/8921  . ASCUS (atypical squamous cells of undetermined significance) on Pap smear 04/06/2012  . Hypercholesterolemia     Past Medical History:  Diagnosis Date  . Allergy   . Hypercholesterolemia   . NSVD (normal spontaneous vaginal delivery)    X3  . Vertigo     Past Surgical History:  Procedure Laterality Date  . CERVICAL BIOPSY  W/ LOOP ELECTRODE EXCISION  2004  &  2005   X 2  . [place] OF UTERUS  2000 & 2003  . VAGINAL HYSTERECTOMY  2005   RECURRENT CERVICAL DYSPLASIA    Social History   Socioeconomic History  . Marital status: Single    Spouse name: Not on file  . Number of children: 3  . Years of education: 73  . Highest education level: Not on file  Occupational History  . Occupation: Glass blower/designer  Social Needs  . Financial resource strain: Not on file  . Food insecurity    Worry: Not on file    Inability: Not on file  . Transportation needs    Medical: Not on file    Non-medical: Not on file  Tobacco Use  . Smoking status: Never Smoker  . Smokeless tobacco: Never Used  Substance and Sexual Activity  . Alcohol use: No    Alcohol/week: 0.0 standard drinks  . Drug use: No  . Sexual activity: Yes    Birth control/protection: None, Surgical    Comment:  1st intercourse- 25, partners- 3,   Lifestyle  . Physical activity    Days per week: Not on file    Minutes per session: Not on file  . Stress: Not on file  Relationships  . Social Herbalist on phone: Not on file    Gets together: Not on file    Attends religious service: Not on file  Active member of club or organization: Not on file    Attends meetings of clubs or organizations: Not on file    Relationship status: Not on file  . Intimate partner violence    Fear of current or ex partner: Not on file    Emotionally abused: Not on file    Physically abused: Not on file    Forced sexual activity: Not on file  Other Topics Concern  . Not on file  Social History Narrative   Lives at home with her daughter and her mother.   Occasional caffeine use (coffee twice weekly).   Right-handed.    Family History  Problem Relation Age of Onset  . Diabetes Mother   . Healthy Sister   . Healthy Brother   . Healthy Brother   . Breast cancer Neg Hx      Review of Systems  Constitutional: Negative.  Negative for chills and fever.  HENT: Negative.  Negative for congestion and sore throat.   Eyes: Negative.   Respiratory: Negative.  Negative for cough and shortness of breath.   Cardiovascular: Negative.  Negative for chest pain and palpitations.  Gastrointestinal: Negative.  Negative for abdominal pain, diarrhea, nausea and vomiting.  Genitourinary: Negative.  Negative for dysuria and hematuria.  Musculoskeletal: Positive for back pain and joint pain.  Skin: Negative.   Neurological: Negative.  Negative for dizziness and headaches.  Endo/Heme/Allergies: Negative.   All other systems reviewed and are negative.  Vitals:   12/06/18 0805  BP: 120/80  Pulse: 66  Resp: 16  Temp: 98.3 F (36.8 C)  SpO2: 97%     Physical Exam Vitals signs reviewed.  Constitutional:      Appearance: Normal appearance.  HENT:     Head: Normocephalic.  Eyes:     Extraocular  Movements: Extraocular movements intact.     Pupils: Pupils are equal, round, and reactive to light.  Neck:     Musculoskeletal: Normal range of motion and neck supple.  Cardiovascular:     Rate and Rhythm: Normal rate and regular rhythm.     Heart sounds: Normal heart sounds.  Pulmonary:     Effort: Pulmonary effort is normal.     Breath sounds: Normal breath sounds.  Abdominal:     General: Bowel sounds are normal. There is no distension.     Palpations: Abdomen is soft.     Tenderness: There is no abdominal tenderness.  Musculoskeletal: Normal range of motion.  Skin:    General: Skin is warm and dry.     Capillary Refill: Capillary refill takes less than 2 seconds.  Neurological:     General: No focal deficit present.     Mental Status: She is alert and oriented to person, place, and time.     Sensory: No sensory deficit.     Motor: No weakness.     Coordination: Coordination normal.     Gait: Gait normal.     Deep Tendon Reflexes: Reflexes normal.  Psychiatric:        Mood and Affect: Mood normal.        Behavior: Behavior normal.      ASSESSMENT & PLAN: Leighton was seen today for hip pain.  Diagnoses and all orders for this visit:  Lumbar back pain with radiculopathy affecting right lower extremity -     methylPREDNISolone acetate (DEPO-MEDROL) injection 80 mg -     Ambulatory referral to Orthopedic Surgery -     methylPREDNISolone (MEDROL DOSEPAK) 4  MG TBPK tablet; Sig as indicated -     traMADol (ULTRAM) 50 MG tablet; Take 1 tablet (50 mg total) by mouth every 8 (eight) hours as needed for up to 5 days.  Need for prophylactic vaccination and inoculation against influenza -     Flu Vaccine QUAD 36+ mos IM  Bilateral sacroiliitis (HCC) -     methylPREDNISolone acetate (DEPO-MEDROL) injection 80 mg -     Ambulatory referral to Orthopedic Surgery -     methylPREDNISolone (MEDROL DOSEPAK) 4 MG TBPK tablet; Sig as indicated  HLA-B27 positive arthropathy  Sciatica  of right side    Patient Instructions   Citica Sciatica  La citica es el dolor, debilidad, hormigueo o prdida de la sensibilidad (adormecimiento) a lo largo del nervio citico. El nervio citico comienza en la parte inferior de la espalda y desciende por la parte posterior de cada pierna. Suele desaparecer por s sola o con tratamiento. A veces, la citica puede volver a aparecer (ser recurrente). Cules son las causas? Esta afeccin se produce cuando el nervio citico se comprime o se ejerce presin sobre l. Esto puede ser el resultado de:  Un disco que sobresale demasiado entre los huesos de la columna vertebral (hernia de disco).  Los cambios que se producen [employer] discos vertebrales al Veterinary surgeon.  Una afeccin en un msculo de las nalgas.  Un crecimiento seo adicional cerca del nervio citico.  Una rotura (fractura) de la zona que est entre los huesos de la cadera (pelvis).  Embarazo.  Tumor. Esto es poco frecuente. Qu incrementa el riesgo? Es ms probable que tengan esta afeccin las personas que:  Therapist, occupational deportes que ponen presin o tensin sobre la columna vertebral.  Tienen poca fuerza y facilidad de movimiento (flexibilidad).  Han tenido una lesin en la espalda en el pasado.  Han tenido una ciruga en la espalda.  Permanecen sentadas durante largos perodos.  Realizan actividades que implican agacharse o levantar objetos una y [place].  Tienen mucho sobrepeso (es obeso). Cules son los signos o los sntomas? Los sntomas pueden variar de leves a muy graves. Pueden incluir los siguientes:  Cualquiera de los siguientes problemas en la parte inferior de la espalda, piernas, cadera o nalgas: ? Hormigueo leve, prdida de la sensibilidad o dolor sordo. ? Sensacin de ardor. ? Dolor agudo.  Prdida de la sensibilidad en la parte posterior de la pantorrilla o la planta del pie.  Debilidad en las piernas.  Dolor muy intenso en la espalda que  dificulta el movimiento. Estos sntomas pueden empeorar al toser, Brewing technologist o rer. Tambin pueden empeorar al sentarse o estar de pie durante largos perodos. Cmo se trata? A menudo, esta afeccin mejora sin tratamiento. Sin embargo, el tratamiento puede incluir:  Quarry manager de [place] fsica o reducirla cuando siente dolor.  Hacer ejercicios y estiramientos.  Aplicar hielo o calor sobre la zona afectada.  Medicamentos para lo siguiente: ? Aliviar el dolor y la inflamacin. ? Relajar los msculos.  Inyecciones de medicamentos que ayudan a Best boy, la irritacin y la hinchazn.  Ciruga. Siga estas instrucciones en su casa: Medicamentos  [employer] de venta libre y los recetados solamente como se lo haya indicado el mdico.  Consulte a su mdico si el medicamento que le recetaron: ? Hace que sea necesario que evite conducir o usar maquinaria pesada. ? Puede causarle dificultad para defecar (estreimiento). Es posible que deba tomar estas medidas para prevenir o tratar los problemas para defecar:  Beber suficiente lquido para Contractor pis (la orina) de color amarillo plido.  Tomar medicamentos recetados o de [employer].  Comer alimentos ricos en fibra. Entre ellos, frijoles, cereales integrales y frutas y verduras frescas.  Limitar los alimentos con alto contenido de grasa y Location manager. Estos incluyen alimentos fritos o dulces. Control del dolor      Si se lo indican, aplique hielo en la zona afectada. ? Ponga el hielo en una bolsa plstica. ? Coloque una [employer] piel y Therapist, nutritional. ? Coloque el hielo durante 72mnutos, 2 a 3veces por da.  Si se lo indican, aplique calor en la zona afectada. Use la fuente de calor que el mdico le indique, por ejemplo, una compresa de calor hmedo o una almohadilla trmica. ? Coloque una tGenuine Partspiel y la fuente de cFreight forwarder ? Aplique calor durante 20 a 375mutos. ? Retire la fuente de calor si la  piel se pone de color rojo brillante. Esto es muy importante si no puede seEducation officer, environmentalcalor o fro. Puede correr un riesgo mayor de sufrir quemaduras. Actividad   Retome sus actividades habituales como se lo haya indicado el mdico. Pregntele al mdico qu actividades son seguras para usted.  Evite las acThe Northwestern Mutualntomas.  Descanse por breves perodos duAgricultural consultant? Cuando descanse durante perodos ms largos, haga alguna actividad fsica o un estiramiento entre los perodos de descanso. ? Evite estar sentado durante largos perodos sin moverse. Levntese y muValdezl menos una vez cada hora.  Haga ejercicios y estrese con regularidad, como se lo indic el mdico.  No levante nada que pese ms de 10libras (4.5kg) mientras tenga sntomas de citica. ? Aunque no tenga sntomas, evite levantar objetos pesados. ? Evite levantar objetos pesados de forma repetida.  Al levantar objetos, hgalo siempre de una forma que sea segura para su cuerpo. Para esto, debe hacer lo siguiente: ? Fl[place] Heights? Mantenga el objeto cerca del cuerpo. ? No gire el cuerpo. Instrucciones generales  Mantenga un peso saludable.  Use calzado cmodo, que le d soporte al pie. Evite usar tacones.  Evite dormir sobre un colchn que sea demasiado blando o demasiado duro. Es posible que sienta menos dolor si duerme en un colchn con apoyo suficientemente firme para la espalda.  Concurra a todas las visitas de seguimiento como se lo haya indicado el mdico. Esto es importante. Comunquese con un mdico si:  Tiene un dolor con estas caractersticas: ? Lo despierta cuando est dormido. ? Empeora al estar recostado. ? Es peEvent organiserue tena en el pasado. ? Dura ms de 4semanas.  Pierde peso sin proponrselo. Solicite ayuda inmediatamente si:  No puede controlar la orina (miccin) ni la evacuacin de la materia fecal (defecacin).  Tiene debilidad en alguna de estas  zonas, y la debilidad empeora: ? La parte inferior de la espalda. ? La zona que se encuentra entre los huLincoln National Corporationaderas. ? Las nalgas. ? Las piernas.  Siente irritacin o inflamacin en la espalda.  Tiene sensacin de ardor al orContinental AirlinesResumen  La citica es el dolor, debilidad, hormigueo o prdida de la sensibilidad (adormecimiento) a lo largo del nervio citico.  Esta afeccin se produce cuando el nervio citico se comprime o se ejerce presin sobre l.  La citica puede caEngineer, drillinghormigueo o prdida de la sensibilidad (adormecimiento) en la parte inferior de la espalda, las piernas, las caderas y las nalgas.  El trMarlin  menudo incluye reposo, ejercicio, medicamentos y Midwife hielo o calor en la zona afectada. Esta informacin no tiene Marine scientist el consejo del mdico. Asegrese de hacerle al mdico cualquier pregunta que tenga. Document Released: 04/11/2010 Document Revised: 05/04/2018 Document Reviewed: 05/04/2018 Elsevier Patient Education  [employer].     If you have lab work done today you will be contacted with your lab results within the next 2 weeks.  If you have not heard from [place] then please contact us. The fastest way to get your results is to register for My Chart.   IF you received an x-ray today, you will receive an invoice from [facility]. Please contact [facility] at [phone] with questions or concerns regarding your invoice.   IF you received labwork today, you will receive an invoice from [facility]. Please contact [facility] at [phone] with questions or concerns regarding your invoice.   Our billing staff will not be able to assist you with questions regarding bills from these companies.  You will be contacted with the lab results as soon as they are available. The fastest way to get your results is to activate your My Chart account. Instructions are located on the last page of this paperwork. If you have  not heard from [place] regarding the results in 2 weeks, please contact this office.         Agustina Caroli, MD [facility]

## 2018-12-06 NOTE — Patient Instructions (Addendum)
Citica Sciatica  La citica es el dolor, debilidad, hormigueo o prdida de la sensibilidad (adormecimiento) a lo largo del nervio citico. El nervio citico comienza en la parte inferior de la espalda y desciende por la parte posterior de cada pierna. Suele desaparecer por s sola o con tratamiento. A veces, la citica puede volver a aparecer (ser recurrente). Cules son las causas? Esta afeccin se produce cuando el nervio citico se comprime o se ejerce presin sobre l. Esto puede ser el resultado de:  Un disco que sobresale demasiado entre los huesos de la columna vertebral (hernia de disco).  Los cambios que se producen [employer] discos vertebrales al Veterinary surgeon.  Una afeccin en un msculo de las nalgas.  Un crecimiento seo adicional cerca del nervio citico.  Una rotura (fractura) de la zona que est entre los huesos de la cadera (pelvis).  Embarazo.  Tumor. Esto es poco frecuente. Qu incrementa el riesgo? Es ms probable que tengan esta afeccin las personas que:  Therapist, occupational deportes que ponen presin o tensin sobre la columna vertebral.  Tienen poca fuerza y facilidad de movimiento (flexibilidad).  Han tenido una lesin en la espalda en el pasado.  Han tenido una ciruga en la espalda.  Permanecen sentadas durante largos perodos.  Realizan actividades que implican agacharse o levantar objetos una y Anthony.  Tienen mucho sobrepeso (es obeso). Cules son los signos o los sntomas? Los sntomas pueden variar de leves a muy graves. Pueden incluir los siguientes:  Cualquiera de los siguientes problemas en la parte inferior de la espalda, piernas, cadera o nalgas: ? Hormigueo leve, prdida de la sensibilidad o dolor sordo. ? Sensacin de ardor. ? Dolor agudo.  Prdida de la sensibilidad en la parte posterior de la pantorrilla o la planta del pie.  Debilidad en las piernas.  Dolor muy intenso en la espalda que dificulta el movimiento. Estos sntomas pueden  empeorar al toser, Brewing technologist o rer. Tambin pueden empeorar al sentarse o estar de pie durante largos perodos. Cmo se trata? A menudo, esta afeccin mejora sin tratamiento. Sin embargo, el tratamiento puede incluir:  Quarry manager de [place] fsica o reducirla cuando siente dolor.  Hacer ejercicios y estiramientos.  Aplicar hielo o calor sobre la zona afectada.  Medicamentos para lo siguiente: ? Aliviar el dolor y la inflamacin. ? Relajar los msculos.  Inyecciones de medicamentos que ayudan a Best boy, la irritacin y la hinchazn.  Ciruga. Siga estas instrucciones en su casa: Medicamentos  [employer] de venta libre y los recetados solamente como se lo haya indicado el mdico.  Consulte a su mdico si el medicamento que le recetaron: ? Hace que sea necesario que evite conducir o usar maquinaria pesada. ? Puede causarle dificultad para defecar (estreimiento). Es posible que deba tomar estas medidas para prevenir o tratar los problemas para defecar:  Electronics engineer suficiente lquido para Contractor pis (la orina) de color amarillo plido.  Tomar medicamentos recetados o de [employer].  Comer alimentos ricos en fibra. Entre ellos, frijoles, cereales integrales y frutas y verduras frescas.  Limitar los alimentos con alto contenido de grasa y Location manager. Estos incluyen alimentos fritos o dulces. Control del dolor      Si se lo indican, aplique hielo en la zona afectada. ? Ponga el hielo en una bolsa plstica. ? Coloque una [employer] piel y Therapist, nutritional. ? Coloque el hielo durante 72minutos, 2 a 3veces por da.  Si se lo indican, aplique calor en la zona afectada.  Use la fuente de calor que el mdico le indique, por ejemplo, una compresa de calor hmedo o una almohadilla trmica. ? Coloque una [employer] piel y la fuente de Freight forwarder. ? Aplique calor durante 20 a 32minutos. ? Retire la fuente de calor si la piel se pone de color rojo brillante. Esto es muy  importante si no puede Education officer, environmental, calor o fro. Puede correr un riesgo mayor de sufrir quemaduras. Actividad   Retome sus actividades habituales como se lo haya indicado el mdico. Pregntele al mdico qu actividades son seguras para usted.  Evite las [employer] sntomas.  Descanse por breves perodos Agricultural consultant. ? Cuando descanse durante perodos ms largos, haga alguna actividad fsica o un estiramiento entre los perodos de descanso. ? Evite estar sentado durante largos perodos sin moverse. Levntese y [place] al menos una vez cada hora.  Haga ejercicios y estrese con regularidad, como se lo indic el mdico.  No levante nada que pese ms de 10libras (4.5kg) mientras tenga sntomas de citica. ? Aunque no tenga sntomas, evite levantar objetos pesados. ? Evite levantar objetos pesados de forma repetida.  Al levantar objetos, hgalo siempre de una forma que sea segura para su cuerpo. Para esto, debe hacer lo siguiente: ? [place]. ? Mantenga el objeto cerca del cuerpo. ? No gire el cuerpo. Instrucciones generales  Mantenga un peso saludable.  Use calzado cmodo, que le d soporte al pie. Evite usar tacones.  Evite dormir sobre un colchn que sea demasiado blando o demasiado duro. Es posible que sienta menos dolor si duerme en un colchn con apoyo suficientemente firme para la espalda.  Concurra a todas las visitas de seguimiento como se lo haya indicado el mdico. Esto es importante. Comunquese con un mdico si:  Tiene un dolor con estas caractersticas: ? Lo despierta cuando est dormido. ? Empeora al estar recostado. ? Es Event organiser que tena en el pasado. ? Dura ms de 4semanas.  Pierde peso sin proponrselo. Solicite ayuda inmediatamente si:  No puede controlar la orina (miccin) ni la evacuacin de la materia fecal (defecacin).  Tiene debilidad en alguna de estas zonas, y la debilidad empeora: ? La parte inferior  de la espalda. ? La zona que se encuentra entre los [employer] caderas. ? Las nalgas. ? Las piernas.  Siente irritacin o inflamacin en la espalda.  Tiene sensacin de ardor al [employer]. Resumen  La citica es el dolor, debilidad, hormigueo o prdida de la sensibilidad (adormecimiento) a lo largo del nervio citico.  Esta afeccin se produce cuando el nervio citico se comprime o se ejerce presin sobre l.  La citica puede Engineer, drilling, hormigueo o prdida de la sensibilidad (adormecimiento) en la parte inferior de la espalda, las piernas, las caderas y las nalgas.  El tratamiento a menudo incluye reposo, ejercicio, medicamentos y Midwife hielo o calor en la zona afectada. Esta informacin no tiene Marine scientist el consejo del mdico. Asegrese de hacerle al mdico cualquier pregunta que tenga. Document Released: 04/11/2010 Document Revised: 05/04/2018 Document Reviewed: 05/04/2018 Elsevier Patient Education  [employer].     If you have lab work done today you will be contacted with your lab results within the next 2 weeks.  If you have not heard from [place] then please contact us. The fastest way to get your results is to register for My Chart.   IF you received an x-ray today, you will receive an invoice  from [facility] Radiology. Please contact [facility] at [phone] with questions or concerns regarding your invoice.   IF you received labwork today, you will receive an invoice from [facility]. Please contact [facility] at [phone] with questions or concerns regarding your invoice.   Our billing staff will not be able to assist you with questions regarding bills from these companies.  You will be contacted with the lab results as soon as they are available. The fastest way to get your results is to activate your My Chart account. Instructions are located on the last page of this paperwork. If you have not heard from [place] regarding the results in 2 weeks,  please contact this office.

## 2018-12-08 ENCOUNTER — Other Ambulatory Visit: Payer: Self-pay | Admitting: Family Medicine

## 2018-12-08 DIAGNOSIS — E785 Hyperlipidemia, unspecified: Secondary | ICD-10-CM

## 2018-12-08 MED ORDER — SIMVASTATIN 40 MG PO TABS: ORAL_TABLET | ORAL | 1 refills | Status: DC

## 2018-12-08 NOTE — Telephone Encounter (Signed)
Copied from [place] [phone]. Topic: Quick Communication - Rx Refill/Question >> Dec 08, 2018 10:45 AM Yvette Rack wrote: Medication: simvastatin (ZOCOR) 40 MG tablet  Has the patient contacted their pharmacy? no  Preferred Pharmacy (with phone number or street name): [facility] [id number], [place] [phone] (Phone)  [phone] (Fax)  Agent: Please be advised that RX refills may take up to 3 business days. We ask that you follow-up with your pharmacy.

## 2018-12-19 ENCOUNTER — Ambulatory Visit (INDEPENDENT_AMBULATORY_CARE_PROVIDER_SITE_OTHER): Payer: Commercial Managed Care - PPO | Admitting: Orthopaedic Surgery

## 2018-12-19 ENCOUNTER — Encounter: Payer: Self-pay | Admitting: Orthopaedic Surgery

## 2018-12-19 DIAGNOSIS — M5431 Sciatica, right side: Secondary | ICD-10-CM

## 2018-12-19 MED ORDER — NABUMETONE 750 MG PO TABS: 750.0000 mg | ORAL_TABLET | Freq: Two times a day (BID) | ORAL | 2 refills | Status: DC | PRN

## 2018-12-19 MED ORDER — GABAPENTIN 100 MG PO CAPS: 100.0000 mg | ORAL_CAPSULE | Freq: Three times a day (TID) | ORAL | 0 refills | Status: DC

## 2018-12-19 NOTE — Progress Notes (Signed)
Office Visit Note   Patient: Natalie Martinez           Date of Birth: 01-27-1966           MRN: [id number] Visit Date: 12/19/2018              Requested by: Horald Pollen, MD [place],  [place] [address] PCP: Libby Maw, MD   Assessment & Plan: Visit Diagnoses:  1. Sciatica, right side     Plan: At this point I am going to start her on Relafen as well as 100 mg of Neurontin up to 3 times a day.  She is going to eventually need an MRI of her lumbar spine to assess for nerve impingement given the failure of conservative treatment and given the chronicity of her symptoms.  She would like to hold off on MRI for right now so I will see her back in 4 weeks to see how she is done with her medications and some back extension exercises.  She has been through physical therapy before as well.  All question concerns were answered addressed.  We will reevaluate her in 4 weeks and see if that point she would be ready to obtain an MRI if there is no improvement in her symptoms.  Follow-Up Instructions: Return in about 4 weeks (around 01/16/2019).   Orders:  No orders of the defined types were placed in this encounter.  Meds ordered this encounter  Medications  . gabapentin (NEURONTIN) 100 MG capsule    Sig: Take 1 capsule (100 mg total) by mouth 3 (three) times daily.    Dispense:  60 capsule    Refill:  0  . nabumetone (RELAFEN) 750 MG tablet    Sig: Take 1 tablet (750 mg total) by mouth 2 (two) times daily as needed.    Dispense:  60 tablet    Refill:  2      Procedures: No procedures performed   Clinical Data: No additional findings.   Subjective: Chief Complaint  Patient presents with  . Lower Back - Pain  The patient is well-known seeing for the first time.  She is a 53 year old female who comes in as referral from her primary care physician for low back pain with right-sided sciatica.  She was started on a steroid taper and that has not  helped.  I was able to look at her chart and see that she has had x-rays of her lumbar spine almost yearly since 2015.  She has not had an MRI of her lumbar spine.  She does not report any change in bowel bladder function but she does report numbness and tingling going down her right leg into her right foot.  She is now diabetic.  HPI  Review of Systems She currently denies any headache, chest pain, shortness of breath, fever, chills, nausea, vomiting  Objective: Vital Signs: There were no vitals taken for this visit.  Physical Exam She is alert and orient x3 and in no acute distress Ortho Exam Examination of her lumbar spine does show some pain with the extremes of flexion extension.  She has a positive straight leg raise on the right side.  She has excellent strength in her bilateral lower extremities and normal sensation.  Her reflexes are also normal bilaterally. Specialty Comments:  No specialty comments available.  Imaging: No results found. An independent review of x-rays of lumbar spine show no acute findings.  There is a slight loss of  her lumbar lordosis but the disc heights are well-maintained and the overall gross alignment is well-maintained.  PMFS History: Patient Active Problem List   Diagnosis Date Noted  . Vitamin D deficiency 11/15/2018  . 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 Idiopathic chloasma 11/02/2017  . Bilateral sacroiliitis ([place]) 11/02/2017  . Class 1 obesity due to excess calories without serious comorbidity with body mass index (BMI) of 30.0 to 30.9 in adult 11/02/2017  . History of condyloma acuminatum 06/21/2017  . Bone cyst of left tibia 06/21/2017  . Dyslipidemia with elevated low density lipoprotein (LDL) cholesterol and abnormally low high density lipoprotein cholesterol 12/18/2016  . Varicose veins of lower extremities with other complications 62/56/3893  . ASCUS (atypical squamous cells of undetermined significance) on Pap smear 04/06/2012   . Hypercholesterolemia    Past Medical History:  Diagnosis Date  . Allergy   . Hypercholesterolemia   . NSVD (normal spontaneous vaginal delivery)    X3  . Vertigo     Family History  Problem Relation Age of Onset  . Diabetes Mother   . Healthy Sister   . Healthy Brother   . Healthy Brother   . Breast cancer Neg Hx     Past Surgical History:  Procedure Laterality Date  . CERVICAL BIOPSY  W/ LOOP ELECTRODE EXCISION  2004  &  2005   X 2  . [place] OF UTERUS  2000 & 2003  . VAGINAL HYSTERECTOMY  2005   RECURRENT CERVICAL DYSPLASIA   Social History   Occupational History  . Occupation: Glass blower/designer  Tobacco Use  . Smoking status: Never Smoker  . Smokeless tobacco: Never Used  Substance and Sexual Activity  . Alcohol use: No    Alcohol/week: 0.0 standard drinks  . Drug use: No  . Sexual activity: Yes    Birth control/protection: None, Surgical    Comment: 1st intercourse- 25, partners- 3,

## 2019-01-12 ENCOUNTER — Other Ambulatory Visit: Payer: Self-pay

## 2019-01-12 DIAGNOSIS — Z20822 Contact with and (suspected) exposure to covid-19: Secondary | ICD-10-CM

## 2019-01-14 LAB — NOVEL CORONAVIRUS, NAA: SARS-CoV-2, NAA: NOT DETECTED

## 2019-01-16 ENCOUNTER — Ambulatory Visit: Payer: Commercial Managed Care - PPO | Admitting: Orthopaedic Surgery

## 2019-01-25 ENCOUNTER — Telehealth: Payer: Self-pay

## 2019-01-25 NOTE — Addendum Note (Signed)
Addended by: Jon Billings on: 01/25/2019 11:38 AM   Modules accepted: Orders

## 2019-01-25 NOTE — Telephone Encounter (Signed)
done

## 2019-01-25 NOTE — Telephone Encounter (Signed)
Copied from [place] [id number]. Topic: General - Other >> Jan 25, 2019 11:08 AM Celene Kras A wrote: Reason for CRM: Pt called and is requesting to have a dermatologist referral placed. Please advise.

## 2019-02-08 ENCOUNTER — Ambulatory Visit: Payer: Commercial Managed Care - PPO | Admitting: Orthopaedic Surgery

## 2019-02-08 ENCOUNTER — Other Ambulatory Visit: Payer: Self-pay

## 2019-02-08 ENCOUNTER — Encounter: Payer: Self-pay | Admitting: Orthopaedic Surgery

## 2019-02-08 VITALS — Ht 62.0 in | Wt 160.0 lb

## 2019-02-08 DIAGNOSIS — M5431 Sciatica, right side: Secondary | ICD-10-CM

## 2019-02-08 NOTE — Progress Notes (Signed)
The patient comes in today for follow-up after dealing with right-sided sciatica.  She had already had a steroid taper by her primary care physician.  We started her on Relafen and gabapentin and back extension exercises.  She states that she is continuing to improve significantly.  She still has some numbness in her right foot but she says is not severe.  She is not diabetic.  She says the numbness and tingling is only occasional and overall she is doing better.  On exam she has negative straight leg raise bilaterally.  She has excellent strength in her bilateral lower extremities and normal sensation today.  She has good flexion-extension of her lumbar spine.  It does seem like her sciatic symptoms are improving to the point that I would not recommend a MRI.  We would only do this if she has in a different direction and gets worse.  She will continue her medications as needed.  All question concerns were answered and addressed.  Follow-up can be as needed.

## 2019-03-03 ENCOUNTER — Encounter: Payer: Self-pay | Admitting: Gastroenterology

## 2019-03-06 ENCOUNTER — Telehealth: Payer: Self-pay

## 2019-03-06 NOTE — Telephone Encounter (Signed)
Spoke with pt and informed her that this referral was already placed back in Nov. Pt states she never received a phone call. Asked Monica & she provided me with the phone number for Estill Bamberg at [facility] with pt and she took down number and she states she will reach out and see where she is with an appointment.    Copied from [place] [phone]. Topic: Referral - Request for Referral >> Mar 06, 2019 12:43 PM Lennox Solders wrote:  patient seen PCP for this complaint? yes Referral for which specialty: dermatologist rash on arm.

## 2019-04-04 ENCOUNTER — Other Ambulatory Visit: Payer: Self-pay

## 2019-04-04 ENCOUNTER — Ambulatory Visit (AMBULATORY_SURGERY_CENTER): Payer: Self-pay | Admitting: *Deleted

## 2019-04-04 VITALS — Temp 96.8°F | Ht 62.0 in | Wt 164.0 lb

## 2019-04-04 DIAGNOSIS — Z01818 Encounter for other preprocedural examination: Secondary | ICD-10-CM

## 2019-04-04 DIAGNOSIS — Z1211 Encounter for screening for malignant neoplasm of colon: Secondary | ICD-10-CM

## 2019-04-04 MED ORDER — SUPREP BOWEL PREP KIT 17.5-3.13-1.6 GM/177ML PO SOLN: 1.0000 | Freq: Once | ORAL | 0 refills | Status: AC

## 2019-04-04 NOTE — Progress Notes (Signed)
No egg or soy allergy known to patient  No issues with past sedation with any surgeries  or procedures, no intubation problems  No diet pills per patient No home 02 use per patient  No blood thinners per patient  Pt denies issues with constipation  No A fib or A flutter  EMMI video sent to pt's e mail   Suprep $15 coupon  Spanish instructions and spanish emmi to pt per her request - she does speak english   Due to the COVID-19 pandemic we are asking patients to follow these guidelines. Please only bring one care partner. Please be aware that your care partner may wait in the car in the parking lot or if they feel like they will be too hot to wait in the car, they may wait in the lobby on the 4th floor. All care partners are required to wear a mask the entire time (we do not have any that we can provide them), they need to practice social distancing, and we will do a Covid check for all patient's and care partners when you arrive. Also we will check their temperature and your temperature. If the care partner waits in their car they need to stay in the parking lot the entire time and we will call them on their cell phone when the patient is ready for discharge so they can bring the car to the front of the building. Also all patient's will need to wear a mask into building.

## 2019-04-07 ENCOUNTER — Encounter: Payer: Self-pay | Admitting: Gastroenterology

## 2019-04-13 ENCOUNTER — Ambulatory Visit (INDEPENDENT_AMBULATORY_CARE_PROVIDER_SITE_OTHER): Payer: Commercial Managed Care - PPO

## 2019-04-13 ENCOUNTER — Other Ambulatory Visit: Payer: Self-pay | Admitting: Gastroenterology

## 2019-04-13 DIAGNOSIS — Z1159 Encounter for screening for other viral diseases: Secondary | ICD-10-CM

## 2019-04-14 LAB — SARS CORONAVIRUS 2 (TAT 6-24 HRS): SARS Coronavirus 2: NEGATIVE

## 2019-04-17 ENCOUNTER — Ambulatory Visit (AMBULATORY_SURGERY_CENTER): Payer: Commercial Managed Care - PPO | Admitting: Gastroenterology

## 2019-04-17 ENCOUNTER — Encounter: Payer: Self-pay | Admitting: Gastroenterology

## 2019-04-17 ENCOUNTER — Other Ambulatory Visit: Payer: Self-pay

## 2019-04-17 VITALS — BP 104/69 | HR 65 | Temp 98.0°F | Resp 12 | Ht 62.0 in | Wt 164.0 lb

## 2019-04-17 DIAGNOSIS — K640 First degree hemorrhoids: Secondary | ICD-10-CM

## 2019-04-17 DIAGNOSIS — Z1211 Encounter for screening for malignant neoplasm of colon: Secondary | ICD-10-CM | POA: Diagnosis not present

## 2019-04-17 DIAGNOSIS — K5730 Diverticulosis of large intestine without perforation or abscess without bleeding: Secondary | ICD-10-CM

## 2019-04-17 DIAGNOSIS — D125 Benign neoplasm of sigmoid colon: Secondary | ICD-10-CM | POA: Diagnosis not present

## 2019-04-17 DIAGNOSIS — D124 Benign neoplasm of descending colon: Secondary | ICD-10-CM

## 2019-04-17 MED ORDER — SODIUM CHLORIDE 0.9 % IV SOLN: 500.0000 mL | Freq: Once | INTRAVENOUS | Status: DC

## 2019-04-17 NOTE — Progress Notes (Signed)
Temp by LC and vitals by DT.

## 2019-04-17 NOTE — Patient Instructions (Signed)
Please read handouts provided. Continue present medications. Await pathology results. Return to GI office as needed.     YOU HAD AN ENDOSCOPIC PROCEDURE TODAY AT [facility]:   Refer to the procedure report that was given to you for any specific questions about what was found during the examination.  If the procedure report does not answer your questions, please call your gastroenterologist to clarify.  If you requested that your care partner not be given the details of your procedure findings, then the procedure report has been included in a sealed envelope for you to review at your convenience later.  YOU SHOULD EXPECT: Some feelings of bloating in the abdomen. Passage of more gas than usual.  Walking can help get rid of the air that was put into your GI tract during the procedure and reduce the bloating. If you had a lower endoscopy (such as a colonoscopy or flexible sigmoidoscopy) you may notice spotting of blood in your stool or on the toilet paper. If you underwent a bowel prep for your procedure, you may not have a normal bowel movement for a few days.  Please Note:  You might notice some irritation and congestion in your nose or some drainage.  This is from the oxygen used during your procedure.  There is no need for concern and it should clear up in a day or so.  SYMPTOMS TO REPORT IMMEDIATELY:   Following lower endoscopy (colonoscopy or flexible sigmoidoscopy):  Excessive amounts of blood in the stool  Significant tenderness or worsening of abdominal pains  Swelling of the abdomen that is new, acute  Fever of 100F or higher   For urgent or emergent issues, a gastroenterologist can be reached at any hour by calling [phone].   DIET:  We do recommend a small meal at first, but then you may proceed to your regular diet.  Drink plenty of fluids but you should avoid alcoholic beverages for 24 hours.  ACTIVITY:  You should plan to take it easy for the rest of  today and you should NOT DRIVE or use heavy machinery until tomorrow (because of the sedation medicines used during the test).    FOLLOW UP: Our staff will call the number listed on your records 48-72 hours following your procedure to check on you and address any questions or concerns that you may have regarding the information given to you following your procedure. If we do not reach you, we will leave a message.  We will attempt to reach you two times.  During this call, we will ask if you have developed any symptoms of COVID 19. If you develop any symptoms (ie: fever, flu-like symptoms, shortness of breath, cough etc.) before then, please call [phone].  If you test positive for Covid 19 in the 2 weeks post procedure, please call and report this information to [place].    If any biopsies were taken you will be contacted by phone or by letter within the next 1-3 weeks.  Please call us at [phone] if you have not heard about the biopsies in 3 weeks.    SIGNATURES/CONFIDENTIALITY: You and/or your care partner have signed paperwork which will be entered into your electronic medical record.  These signatures attest to the fact that that the information above on your After Visit Summary has been reviewed and is understood.  Full responsibility of the confidentiality of this discharge information lies with you and/or your care-partner.

## 2019-04-17 NOTE — Progress Notes (Signed)
Pt tolerated well. VSS. Awake and to recovery. 

## 2019-04-17 NOTE — Op Note (Signed)
[place] Patient Name: Natalie Martinez Procedure Date: 04/17/2019 9:19 AM MRN: [id number] Endoscopist: Gerrit Heck , MD Age: 54 Referring MD:  Date of Birth: 1965-09-25 Gender: Female Account #: [id number] Procedure:                Colonoscopy Indications:              Screening for colorectal malignant neoplasm, This                            is the patient's first colonoscopy Medicines:                Monitored Anesthesia Care Procedure:                Pre-Anesthesia Assessment:                           - Prior to the procedure, a History and Physical                            was performed, and patient medications and                            allergies were reviewed. The patient's tolerance of                            previous anesthesia was also reviewed. The risks                            and benefits of the procedure and the sedation                            options and risks were discussed with the patient.                            All questions were answered, and informed consent                            was obtained. Prior Anticoagulants: The patient has                            taken no previous anticoagulant or antiplatelet                            agents. ASA Grade Assessment: II - A patient with                            mild systemic disease. After reviewing the risks                            and benefits, the patient was deemed in                            satisfactory condition to undergo the procedure.  After obtaining informed consent, the colonoscope                            was passed under direct vision. Throughout the                            procedure, the patient's blood pressure, pulse, and                            oxygen saturations were monitored continuously. The                            Colonoscope was introduced through the anus and                            advanced to the the  terminal ileum. The colonoscopy                            was performed without difficulty. The patient                            tolerated the procedure well. The quality of the                            bowel preparation was excellent. The terminal                            ileum, ileocecal valve, appendiceal orifice, and                            rectum were photographed. Scope In: 9:21:46 AM Scope Out: 9:37:25 AM Scope Withdrawal Time: 0 hours 13 minutes 9 seconds  Total Procedure Duration: 0 hours 15 minutes 39 seconds  Findings:                 The perianal and digital rectal examinations were                            normal.                           Three sessile polyps were found in the sigmoid                            colon (2) and descending colon. The polyps were 3                            to 4 mm in size. These polyps were removed with a                            cold snare. Resection and retrieval were complete.                            Estimated blood loss was minimal.  A single small-mouthed diverticulum was found in                            the transverse colon.                           Non-bleeding internal hemorrhoids were found during                            retroflexion. The hemorrhoids were small.                           Retroflexion in the right colon was performed and                            normal appearing on retroflexed views.                           The terminal ileum appeared normal. Complications:            No immediate complications. Estimated Blood Loss:     Estimated blood loss was minimal. Impression:               - Three 3 to 4 mm polyps in the sigmoid colon and                            in the descending colon, removed with a cold snare.                            Resected and retrieved.                           - Diverticulosis in the transverse colon.                           - Non-bleeding  internal hemorrhoids.                           - The examined portion of the ileum was normal. Recommendation:           - Patient has a contact number available for                            emergencies. The signs and symptoms of potential                            delayed complications were discussed with the                            patient. Return to normal activities tomorrow.                            Written discharge instructions were provided to the                            patient.                           -  Resume previous diet.                           - Continue present medications.                           - Await pathology results.                           - Repeat colonoscopy for surveillance based on                            pathology results.                           - Return to GI office PRN.                           - Internal hemorrhoids were noted on this study and                            may be amenable to hemorrhoid band ligation. If you                            are interested in further treatment of these                            hemorrhoids with band ligation, please contact my                            clinic to set up an appointment for evaluation and                            treatment. Gerrit Heck, MD 04/17/2019 9:43:43 AM

## 2019-04-17 NOTE — Progress Notes (Signed)
Called to room to assist during endoscopic procedure.  Patient ID and intended procedure confirmed with present staff. Received instructions for my participation in the procedure from the performing physician.  

## 2019-04-19 ENCOUNTER — Telehealth: Payer: Self-pay

## 2019-04-19 ENCOUNTER — Encounter: Payer: Self-pay | Admitting: Gastroenterology

## 2019-04-19 ENCOUNTER — Telehealth: Payer: Self-pay | Admitting: *Deleted

## 2019-04-19 NOTE — Telephone Encounter (Signed)
Message left

## 2019-04-19 NOTE — Telephone Encounter (Signed)
  Follow up Call-  Call back number 04/17/2019  Post procedure Call Back phone  # [phone]  Permission to leave phone message Yes  Some recent data might be hidden     Left message

## 2019-05-23 ENCOUNTER — Other Ambulatory Visit: Payer: Self-pay | Admitting: Family Medicine

## 2019-05-23 NOTE — Telephone Encounter (Signed)
Dr. Ethelene Hal please advise this Rx d/c on 04/17/19 but pt stated she still needs this medicine.

## 2019-06-02 ENCOUNTER — Other Ambulatory Visit: Payer: Self-pay | Admitting: Family Medicine

## 2019-06-05 ENCOUNTER — Other Ambulatory Visit: Payer: Self-pay

## 2019-06-05 DIAGNOSIS — S39012A Strain of muscle, fascia and tendon of lower back, initial encounter: Secondary | ICD-10-CM

## 2019-06-05 NOTE — Telephone Encounter (Signed)
Okay 

## 2019-06-05 NOTE — Telephone Encounter (Signed)
Patient calling for refill on Hydroquinone cream rx was filled 10 days ago. Patient states that she is using twice a day. Okay to refill?

## 2019-06-06 MED ORDER — METHOCARBAMOL 500 MG PO TABS: 500.0000 mg | ORAL_TABLET | Freq: Three times a day (TID) | ORAL | 0 refills | Status: DC | PRN

## 2019-06-06 MED ORDER — HYDROQUINONE 4 % EX CREA: TOPICAL_CREAM | Freq: Two times a day (BID) | CUTANEOUS | 0 refills | Status: DC

## 2019-06-13 ENCOUNTER — Ambulatory Visit: Payer: Commercial Managed Care - PPO | Admitting: Obstetrics & Gynecology

## 2019-06-13 ENCOUNTER — Encounter: Payer: Self-pay | Admitting: Obstetrics & Gynecology

## 2019-06-13 ENCOUNTER — Other Ambulatory Visit: Payer: Self-pay

## 2019-06-13 VITALS — BP 130/88

## 2019-06-13 DIAGNOSIS — L738 Other specified follicular disorders: Secondary | ICD-10-CM

## 2019-06-13 NOTE — Progress Notes (Signed)
    Natalie Martinez 29-Sep-1965 [id number]        54 y.o.  G3P3L3 Boyfriend   RP:  Established patient presenting for vulvar lesions  HPI: Worried about the possibility of vulvar warts.  Has a small raised lesion on the right vulva.  Not painful.  No drainage.  H/O S/P Total Hysterectomy for Dysplasia, no residual on patho. No pelvic pain. Normal vaginal secretions. 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      OB History  Gravida Para Term Preterm AB Living  3 3 3     3   SAB TAB Ectopic Multiple Live Births          3    # Outcome Date GA Lbr Len/2nd Weight Sex Delivery Anes PTL Lv  3 Term     F Vag-Spont  N LIV  2 Term     F Vag-Spont  N DEC  1 Term     M Vag-Spont  N LIV    Past medical history,surgical history, problem list, medications, allergies, family history and social history were all reviewed and documented in the EPIC chart.   Directed ROS with pertinent positives and negatives documented in the history of present illness/assessment and plan.  Exam:  Vitals:   06/13/19 1436  BP: 130/88   General appearance:  Normal   Gynecologic exam: Vulva:  Very small sebaceous gland with secretions/pus at the right mid vulva.  Drainage of secretions/pus with pressure.     Assessment/Plan:  54 y.o. G3P3003   1. Infected sebaceous gland Drained with pressure.  Reassured.  Princess Bruins MD, 2:57 PM 06/13/2019

## 2019-06-13 NOTE — Patient Instructions (Signed)
1. Infected sebaceous gland Drained with pressure.  Reassured.  Bethann Humble un placer verle hoy!

## 2019-07-03 ENCOUNTER — Ambulatory Visit
Admit: 2019-07-03 | Discharge: 2019-07-03 | Payer: PRIVATE HEALTH INSURANCE | Attending: Physical Medicine & Rehabilitation | Primary: Family Medicine

## 2019-07-03 ENCOUNTER — Ambulatory Visit: Attending: Physical Medicine & Rehabilitation | Primary: Internal Medicine

## 2019-07-03 DIAGNOSIS — M48062 Spinal stenosis, lumbar region with neurogenic claudication: Secondary | ICD-10-CM

## 2019-07-03 MED ORDER — ETODOLAC 400 MG TAB
400 mg | ORAL_TABLET | ORAL | 0 refills | Status: DC
Start: 2019-07-03 — End: 2019-09-13

## 2019-07-03 MED ORDER — AMITRIPTYLINE 25 MG TAB
25 mg | ORAL_TABLET | Freq: Every evening | ORAL | 1 refills | Status: DC
Start: 2019-07-03 — End: 2020-01-23

## 2019-07-03 NOTE — Progress Notes (Signed)
Progress  Notes by [name], MD at 07/03/19 1430                Author: [name], MD  Service: --  Author Type: Physician       Filed: 07/05/19 2221  Encounter Date: 07/03/2019  Status: Signed          Editor: [name], MD (Physician)                     [facility]  [address]   [place], [place] [address]   Phone: [phone]   Fax: [phone]            [name] [name], [name] [name]   DOB: 25-Apr-1965   PCP: Siri Cole, MD         NEW PATIENT         ASSESSMENT AND PLAN      Diagnoses and all orders for this visit:      1. Lumbar stenosis with neurogenic claudication   -     amitriptyline (ELAVIL) 25 mg tablet; Take 1 Tab by mouth nightly.   -     etodolac (LODINE) 400 mg tablet; Take 1 PO once or twice a day with food as needed for pain   -     REFERRAL TO PHYSICAL THERAPY      2. DDD (degenerative disc disease), lumbar   Comments:   L4-5   Orders:   -     etodolac (LODINE) 400 mg tablet; Take 1 PO once or twice a day with food as needed for pain   -     REFERRAL TO PHYSICAL THERAPY            1.  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   2.  Advised to stay active as tolerated.    3.  Restart Elavil    4.  Trial of Lodine   5.  Referred to PT   6.  Discussed life style modification, PT, medication, spinal injection, and surgery as treatment options   7.  Given information on back care basics          Follow-up and Dispositions      ??  Return for 4-6 weeks.                  CHIEF COMPLAINT  [name] [name] is seen today in consultation at the request  of Dr. Glynis Smiles for complaints of back and BLE pain        HISTORY OF PRESENT ILLNESS  [name] [name] is a  54 y.o. female.  Today pt c/o back and BLE pain of years duration. Pt  denies any specific incident or injury that caused their pain.         She mentions that her back bothers her about 3-4 times per month. Today, pt  states that right now she is between flares. When it is flared, she reports that she feels pain down the BLE. Notes that her standing/walking tolerance varies a lot depending  on the day. She mentions that Flexeril is the only medication that works well for her pain and with sleep. Affirms headaches, noting that she has had one for the past 4 days. Also mentions previously trying Elavil with benefit for her headaches.          Pain Assessment   07/03/2019  Location of Pain  Back     Severity of Pain  2     Quality of Pain  Sharp;Aching;Other (Comment)     Quality of Pain Comment  tender to touch, shooting pain down the legs at times     Duration of Pain  Persistent     Frequency of Pain  Intermittent     Aggravating Factors  Other (Comment)     Aggravating Factors Comment  pain is random no matter what I do     Limiting Behavior  Yes        Relieving Factors  Nothing          Does pain radiate into extremities: BLE   Does patient have numbness/tingling: no   Does patient have weakness: no    Denies red flags including: persistent fevers, chills, weight changes, saddle paresthesias, and neurogenic bowel or bladder symptoms.       Treatments patient has tried:   Physical therapy:No   Doing HEP: Unknown   Beneficial medications: Flexeril 10 mg.    Failed medications: Naproxen   Spinal injections: No      Spinal surgery- No.    Spine surgery consult: No.       L MRI 02/2019: mod stenosis L3-4 and L4-5      PMP reviewed. PMHx of asthma, COVID (11/2018). 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         PAST MEDICAL HISTORY      Past Medical History:        Diagnosis  Date         ?  Asthma           ?  Headache                Past Surgical History:         Procedure  Laterality  Date          ?  HX APPENDECTOMY    2006     ?  HX CESAREAN SECTION    1987, 1994, 1998     ?  HX CHOLECYSTECTOMY    1998     ?  HX HYSTERECTOMY              ?  HX HYSTERECTOMY    2012               MEDICATIONS    Current Outpatient Medications          Medication  Sig  Dispense  Refill           ?  hydroCHLOROthiazide (HYDRODIURIL) 25 mg tablet  Take 1 Tab by mouth daily.  60 Tab  0     ?  multivitamin (ONE A DAY) tablet  Take 1 Tab by mouth daily.         ?  albuterol (PROAIR HFA) 90 mcg/actuation inhaler  Take 2 Puffs by inhalation every six (6) hours as needed for Wheezing. Indications: ACUTE ASTHMA ATTACK  1 Inhaler  5     ?  cetirizine (ZYRTEC) 10 mg tablet  Take 1 Tab by mouth daily. Indications: PERENNIAL ALLERGIC RHINITIS  30 Tab  5     ?  fluticasone (FLONASE) 50 mcg/actuation nasal spray  2 Sprays by Both Nostrils route daily. Indications: ALLERGIC RHINITIS  1 Bottle  5     ?  amitriptyline (ELAVIL) 25 mg tablet  Take 1 Tab by mouth nightly.  30 Tab  5           ?  SYMBICORT 80-4.5 mcg/actuation HFAA inhaler                 Controlled Substance Monitoring:      No flowsheet data found.         ALLERGIES  Allergies        Allergen  Reactions         ?  Latex  Hives     ?  Pollen Extracts  Cough, Sneezing and Other (comments)             Irritates asthma         ?  Tomato  Hives              PHYSICAL EXAMINATION   Visit Vitals      Pulse  96     Temp  99.2 ??F (37.3 ??C) (Tympanic)     Ht  5\' 4"  (1.626 m)     Wt  281 lb (127.5 kg)         SpO2  98%  Comment: RA        BMI  48.23 kg/m??             SPINE/MUSCULOSKELETAL EXAM      Tenderness to palpation of midline sacrum. Mild difficulty with tandem gait. LE strength intact. SLR negative. Intact heel rise and toe rise.          Written by Sid Falcon, ScribeKick, as dictated by Leary Roca, MD.      I, Dr. Leary Roca, MD, confirm that all documentation is accurate.         [name] [name] may have a reminder  for a "due or due soon" health maintenance. I have asked that she contact her  primary care provider for follow-up on this health maintenance.

## 2019-07-03 NOTE — Progress Notes (Signed)
Verbal order entered per Dr. Wilford Corner as documented on blue sheet: 1. Change pharmacy to [facility]. 2. Physical therapy referral - Lumbar stenosis. 3. lodine 400 mg- 1 PO BID with food prn pain. Disp#60, 0 RF

## 2019-07-03 NOTE — Progress Notes (Signed)
[name] presents today for   Chief Complaint   Patient presents with   . Back Pain       Is someone accompanying this pt? no    Is the patient using any DME equipment during OV? no    Depression Screening:  3 most recent PHQ Screens 03/30/2017   Little interest or pleasure in doing things Not at all   Feeling down, depressed, irritable, or hopeless Not at all   Total Score PHQ 2 0       Learning Assessment:  Learning Assessment 12/06/2013   PRIMARY LEARNER Patient   HIGHEST LEVEL OF EDUCATION - PRIMARY LEARNER  SOME COLLEGE   BARRIERS PRIMARY LEARNER NONE   CO-LEARNER CAREGIVER No   PRIMARY LANGUAGE ENGLISH   LEARNER PREFERENCE PRIMARY READING   ANSWERED BY Natallie Sotomayor   RELATIONSHIP SELF         Coordination of Care:  1. Have you been to the ER, urgent care clinic since your last visit? no  Hospitalized since your last visit? no    2. Have you seen or consulted any other health care providers outside of the [facility] since your last visit? Yes, pcp Include any pap smears or colon screening. no

## 2019-07-03 NOTE — Progress Notes (Signed)
[facility] [place] [facility]  [address]  [place], [place] [address]  Phone: [phone]  Fax: [phone]        [name], [name]  DOB: Mar 22, 1966  PCP: Siri Cole, MD      NEW PATIENT      ASSESSMENT AND PLAN     Diagnoses and all orders for this visit:    1. Lumbar stenosis with neurogenic claudication  -     amitriptyline (ELAVIL) 25 mg tablet; Take 1 Tab by mouth nightly.  -     etodolac (LODINE) 400 mg tablet; Take 1 PO once or twice a day with food as needed for pain  -     REFERRAL TO PHYSICAL THERAPY    2. DDD (degenerative disc disease), lumbar  Comments:  L4-5  Orders:  -     etodolac (LODINE) 400 mg tablet; Take 1 PO once or twice a day with food as needed for pain  -     REFERRAL TO PHYSICAL THERAPY        1. 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  2. Advised to stay active as tolerated.   3. Restart Elavil   4. Trial of Lodine  5. Referred to PT  6. Discussed life style modification, PT, medication, spinal injection, and surgery as treatment options  7. Given information on back care basics      Follow-up and Dispositions    ?? Return for 4-6 weeks.           CHIEF COMPLAINT  [name] [name] is seen today in consultation at the request of Dr. Glynis Smiles for complaints of back and BLE pain       HISTORY OF PRESENT ILLNESS  [name] [name] is a 54 y.o. female.  Today pt c/o back and BLE pain of years duration. Pt denies any specific incident or injury that caused their pain.       She mentions that her back bothers her about 3-4 times per month. Today, pt states that right now she is between flares. When it is flared, she reports that she feels pain down the BLE. Notes that her standing/walking tolerance varies a lot depending on the day. She mentions that Flexeril is the only medication that works well for her pain and with sleep. Affirms headaches, noting that she has had one for the past 4  days. Also mentions previously trying Elavil with benefit for her headaches.     Pain Assessment  07/03/2019   Location of Pain Back   Severity of Pain 2   Quality of Pain Sharp;Aching;Other (Comment)   Quality of Pain Comment tender to touch, shooting pain down the legs at times   Duration of Pain Persistent   Frequency of Pain Intermittent   Aggravating Factors Other (Comment)   Aggravating Factors Comment pain is random no matter what I do   Limiting Behavior Yes   Relieving Factors Nothing       Does pain radiate into extremities: BLE  Does patient have numbness/tingling: no  Does patient have weakness: no   Denies red flags including: persistent fevers, chills, weight changes, saddle paresthesias, and neurogenic bowel or bladder symptoms.     Treatments patient has tried:  Physical therapy:No  Doing HEP: Unknown  Beneficial medications: Flexeril 10 mg.   Failed medications: Naproxen  Spinal injections: No    Spinal surgery- No.   Spine surgery consult: No.  L MRI 02/2019: mod stenosis L3-4 and L4-5    PMP reviewed. PMHx of asthma, COVID (11/2018). 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      PAST MEDICAL HISTORY   Past Medical History:   Diagnosis Date   ??? Asthma    ??? Headache         Past Surgical History:   Procedure Laterality Date   ??? HX APPENDECTOMY  2006   ??? HX CESAREAN SECTION  1987, 1994, 1998   ??? [place]   ??? HX HYSTERECTOMY     ??? HX HYSTERECTOMY  2012        MEDICATIONS    Current Outpatient Medications   Medication Sig Dispense Refill   ??? hydroCHLOROthiazide (HYDRODIURIL) 25 mg tablet Take 1 Tab by mouth daily. 60 Tab 0   ??? multivitamin (ONE A DAY) tablet Take 1 Tab by mouth daily.     ??? albuterol (PROAIR HFA) 90 mcg/actuation inhaler Take 2 Puffs by inhalation every six (6) hours as needed for Wheezing. Indications: ACUTE ASTHMA ATTACK 1 Inhaler 5   ??? cetirizine (ZYRTEC) 10 mg tablet Take 1 Tab by mouth daily. Indications: PERENNIAL ALLERGIC  RHINITIS 30 Tab 5   ??? fluticasone (FLONASE) 50 mcg/actuation nasal spray 2 Sprays by Both Nostrils route daily. Indications: ALLERGIC RHINITIS 1 Bottle 5   ??? amitriptyline (ELAVIL) 25 mg tablet Take 1 Tab by mouth nightly. 30 Tab 5   ??? SYMBICORT 80-4.5 mcg/actuation HFAA inhaler          Controlled Substance Monitoring:    No flowsheet data found.     ALLERGIES  Allergies   Allergen Reactions   ??? Latex Hives   ??? Pollen Extracts Cough, Sneezing and Other (comments)     Irritates asthma   ??? Tomato Hives          PHYSICAL EXAMINATION  Visit Vitals  Pulse 96   Temp 99.2 ??F (37.3 ??C) (Tympanic)   Ht 5\' 4"  (1.626 m)   Wt 281 lb (127.5 kg)   SpO2 98% Comment: RA   BMI 48.23 kg/m??         SPINE/MUSCULOSKELETAL EXAM    Tenderness to palpation of midline sacrum. Mild difficulty with tandem gait. LE strength intact. SLR negative. Intact heel rise and toe rise.       Written by [name] [name], ScribeKick, as dictated by [name] Roca, MD.    I, Dr. Leary Roca, MD, confirm that all documentation is accurate.      Ms. Groll may have a reminder for a "due or due soon" health maintenance. I have asked that she contact her primary care provider for follow-up on this health maintenance.

## 2019-07-03 NOTE — Progress Notes (Signed)
Verbal order entered per Dr. Arora as documented on blue sheet: 1. Change pharmacy to [facility] 2. Physical therapy referral - Lumbar stenosis. 3. lodine 400 mg- 1 PO BID with food prn pain. Disp#60, 0 RF

## 2019-07-03 NOTE — Progress Notes (Signed)
[name] [name] presents today for   Chief Complaint   Patient presents with   ??? Back Pain       Is someone accompanying this pt? no    Is the patient using any DME equipment during OV? no    Depression Screening:  3 most recent PHQ Screens 03/30/2017   Little interest or pleasure in doing things Not at all   Feeling down, depressed, irritable, or hopeless Not at all   Total Score PHQ 2 0       Learning Assessment:  Learning Assessment 12/06/2013   PRIMARY LEARNER Patient   HIGHEST LEVEL OF EDUCATION - PRIMARY LEARNER  SOME COLLEGE   BARRIERS PRIMARY LEARNER NONE   CO-LEARNER CAREGIVER No   PRIMARY LANGUAGE ENGLISH   LEARNER PREFERENCE PRIMARY READING   ANSWERED BY Shayonna Hurlock   RELATIONSHIP SELF         Coordination of Care:  1. Have you been to the ER, urgent care clinic since your last visit? no  Hospitalized since your last visit? no    2. Have you seen or consulted any other health care providers outside of the [facility] since your last visit? Yes, pcp Include any pap smears or colon screening. no

## 2019-07-03 NOTE — Patient Instructions (Addendum)
Learning About How to Have a Healthy Back  What causes back pain?     Back pain is often caused by overuse, strain, or injury. For example, people often hurt their backs playing sports or working in the yard, being jolted in a car accident, or lifting something too heavy.  Aging plays a part too. Your bones and muscles tend to lose strength as you age, which makes injury more likely. The spongy discs between the bones of the spine (vertebrae) may suffer from wear and tear and no longer provide enough cushion between the bones. A disc that bulges or breaks open (herniated disc) can press on nerves, causing back pain.  In some people, back pain is the result of arthritis, broken vertebrae caused by bone loss (osteoporosis), illness, or a spine problem.  Although most people have back pain at one time or another, there are steps you can take to make it less likely.  How can you have a healthy back?  Reduce stress on your back through good posture   Slumping or slouching alone may not cause low back pain. But after the back has been strained or injured, bad posture can make pain worse.  ?? Sleep in a position that maintains your back's normal curves and on a mattress that feels comfortable. Sleep on your side with a pillow between your knees, or sleep on your back with a pillow under your knees. These positions can reduce strain on your back.  ?? Stand and sit up straight. "Good posture" generally means your ears, shoulders, and hips are in a straight line.  ?? If you must stand for a long time, put one foot on a stool, ledge, or box. Switch feet every now and then.  ?? Sit in a chair that is low enough to let you place both feet flat on the floor with both knees nearly level with your hips. If your chair or desk is too high, use a footrest to raise your knees. Place a small pillow, a rolled-up towel, or a lumbar roll in the curve of your back if you need extra support.  ?? Try a kneeling chair, which helps tilt your  hips forward. This takes pressure off your lower back.  ?? Try sitting on an exercise ball. It can rock from side to side, which helps keep your back loose.  ?? When driving, keep your knees nearly level with your hips. Sit straight, and drive with both hands on the steering wheel. Your arms should be in a slightly bent position.  Reduce stress on your back through careful lifting   ?? Squat down, bending at the hips and knees only. If you need to, put one knee to the floor and extend your other knee in front of you, bent at a right angle (half kneeling).  ?? Press your chest straight forward. This helps keep your upper back straight while keeping a slight arch in your low back.  ?? Hold the load as close to your body as possible, at the level of your belly button (navel).  ?? Use your feet to change direction, taking small steps.  ?? Lead with your hips as you change direction. Keep your shoulders in line with your hips as you move.  ?? Set down your load carefully, squatting with your knees and hips only.  Exercise and stretch your back   ?? Do some exercise on most days of the week, if your doctor says it is okay. You can   walk, run, swim, or cycle.  ?? Stretch your back muscles. Here are a few exercises to try:  ? Lie on your back, and gently pull one bent knee to your chest. Put that foot back on the floor, and then pull the other knee to your chest.  ? Do pelvic tilts. Lie on your back with your knees bent. Tighten your stomach muscles. Pull your belly button (navel) in and up toward your ribs. You should feel like your back is pressing to the floor and your hips and pelvis are slightly lifting off the floor. Hold for 6 seconds while breathing smoothly.  ? Sit with your back flat against a wall.  ?? Keep your core muscles strong. The muscles of your back, belly (abdomen), and buttocks support your spine.  ? Pull in your belly and imagine pulling your navel toward your spine. Hold this for 6 seconds, then relax. Remember  to keep breathing normally as you tense your muscles.  ? Do curl-ups. Always do them with your knees bent. Keep your low back on the floor, and curl your shoulders toward your knees using a smooth, slow motion. Keep your arms folded across your chest. If this bothers your neck, try putting your hands behind your neck (not your head), with your elbows spread apart.  ? Lie on your back with your knees bent and your feet flat on the floor. Tighten your belly muscles, and then push with your feet and raise your buttocks up a few inches. Hold this position 6 seconds as you continue to breathe normally, then lower yourself slowly to the floor. Repeat 8 to 12 times.  ? If you like group exercise, try Pilates or yoga. These classes have poses that strengthen the core muscles.  Lead a healthy lifestyle   ?? Stay at a healthy weight to avoid strain on your back.  ?? Do not smoke. Smoking increases the risk of osteoporosis, which weakens the spine. If you need help quitting, talk to your doctor about stop-smoking programs and medicines. These can increase your chances of quitting for good.  Where can you learn more?  Go to https://www.healthwise.net/[facility]  Enter L315 in the search box to learn more about "Learning About How to Have a Healthy Back."  Current as of: February 06, 2019??????????????????????????????Content Version: 12.8  ?? 2006-2021 Healthwise, Incorporated.   Care instructions adapted under license by [employer] (which disclaims liability or warranty for this information). If you have questions about a medical condition or this instruction, always ask your healthcare professional. Healthwise, Incorporated disclaims any warranty or liability for your use of this information.

## 2019-07-14 ENCOUNTER — Other Ambulatory Visit: Payer: Self-pay | Admitting: Family Medicine

## 2019-07-14 DIAGNOSIS — E785 Hyperlipidemia, unspecified: Secondary | ICD-10-CM

## 2019-07-19 ENCOUNTER — Inpatient Hospital Stay: Admit: 2019-07-20 | Payer: PRIVATE HEALTH INSURANCE | Primary: Family Medicine

## 2019-07-19 DIAGNOSIS — M48062 Spinal stenosis, lumbar region with neurogenic claudication: Secondary | ICD-10-CM

## 2019-07-19 NOTE — Progress Notes (Signed)
 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    Rationale: increase ROM and increase strength to improve the patient's ability to tolerate ADLs    8 min Self Care/Home Management: []   See flow sheet : pt education regarding anatomy and physiology of the spine/LEs/SIJ and how it relates to the pt's condition, pt education on avoiding activities that increase her radicular pain/symptoms   Rationale: increase ROM, increase strength and decrease pain/symptoms  to improve the patient's ability to tolerate functional tasks.    3 min Manual Therapy:  In supine: right LE pull to correct right innominate upslip.    The manual therapy interventions were performed at a separate and distinct time from the therapeutic activities interventions.  Rationale: decrease pain, increase ROM, increase tissue extensibility and increase postural awareness to improve activity tolerance.           With   [x]  TE   []  TA   []  neuro   [x]  Other: Self Care/Home management Patient Education: [x]  Review HEP    []   Progressed/Changed HEP based on:   []  positioning   []  body mechanics   []  transfers   []  heat/ice application    []  other:      Other Objective/Functional Measures: See evaluation.     Pain Level (0-10 scale) post treatment: 1    ASSESSMENT/Changes in Function: Pt given HEP handout to perform. Pt understood exercises in HEP handout. Improvement in pelvic alignment and pain reported post manual interventions. Pt demonstrated decreased AROM, decreased strength, muscle tightness, tenderness to palpation, and impaired pelvic alignment. Pt would benefit from physical therapy to improve the above impairments to help the pt return to performing ADLs, functional and work activities.     Patient will continue to benefit from skilled PT services to modify and progress therapeutic interventions, address functional mobility deficits, address ROM deficits, address strength deficits, analyze and address soft tissue restrictions, analyze and cue movement patterns, analyze and modify body mechanics/ergonomics, assess and modify postural abnormalities, address imbalance/dizziness and instruct in home and community integration to attain remaining goals.     [x]   See Plan of Care  []   See progress note/recertification  []   See Discharge Summary         Progress towards goals / Updated goals:  Short Term Goals: To be accomplished in 2 treatments:  1. Pt will report compliance and independence to HEP  to help the pt manage their pain and symptoms.   Eval: established   Long Term Goals: To be accomplished in 10 treatments:  1. Pt will increase FOTO score to 66 points to improve ability to perform ADLs.  Eval: 58 points  2. Pt will increase MMT B hip ER/IR to 4/5, B hip flex to 4+/5 to improve ability to ambulate with less pain.  Eval: B hip ER/IR 4-/5, B hip flex 4/5  3. Pt will increase AROM l/s flex/left SB to WNL with minimal to no increased pain in the low back to improve performance of daily household activities and chores.   Eval:  l/s flex and left SB 75-100% of WNL with increased LBP/pulling  4. Pt will report being able to bend at work with mild to no increased LBP to improve ability to perform work activities.   Eval: reports having most of her pain with bending    PLAN  [x]   Upgrade activities as tolerated     [x]   Continue plan of care  [x]   Update interventions per flow sheet       []   Discharge due to:_  []   Other:_      [name] [name], PT 07/19/2019  5:31 PM    Future Appointments   Date Time Provider Department Center   08/07/2019  1:45 PM Voncile Doralee HERO, MD [facility]

## 2019-07-19 NOTE — Progress Notes (Signed)
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      The Plan of Care and following information is based on the information from the initial evaluation.  Assessment/ key information:   Pt is a 54 year old female who presents to therapy today with LBP. Pt states that her initial symptoms are chronic in nature with insidious onset. She reports worsening of her symptoms in August/September 2020 with insidious onset as well with no injury/trauma. Pt reports worsening of her symptoms with bending and sometimes with walking/standing. Her pain can radiate into her B buttocks and posterior/lateral thighs. She denies any numbness/tingling into the LEs. Pt demonstrated decreased AROM, decreased strength, muscle tightness, tenderness to palpation, and impaired pelvic alignment. Pt would benefit from physical therapy to improve the above impairments to help the pt return to performing ADLs, functional and work activities.     Evaluation Complexity History HIGH Complexity :3+ comorbidities / personal factors will impact the outcome/ POC ; Examination HIGH Complexity :  4+ Standardized tests and measures addressing body structure, function, activity limitation and / or participation in recreation  ;Presentation MEDIUM Complexity : Evolving with changing characteristics  ;Clinical Decision Making MEDIUM Complexity : FOTO score of 26-74  Overall Complexity Rating: MEDIUM  Problem List: pain affecting function, decrease ROM, decrease strength, impaired gait/ balance, decrease ADL/ functional abilitiies, decrease activity tolerance, decrease flexibility/ joint mobility and decrease transfer abilities   Treatment Plan may include any combination of the following: Therapeutic exercise, Therapeutic activities, Neuromuscular re-education, Physical agent/modality, Gait/balance training, Manual therapy, Patient education, Self Care training, Functional mobility training, Home safety training and Stair training  Patient / Family readiness to learn indicated by: asking questions, trying to perform skills and interest  Persons(s) to be included in education: patient (P)  Barriers to Learning/Limitations: None  Patient Goal (s): "relief from the pain"  Patient Self Reported Health Status: fair  Rehabilitation Potential: fair    Short Term Goals: To be accomplished in 2 treatments:  1. Pt will report compliance and independence to HEP to help the pt manage their pain and symptoms.   Eval: established   Long Term Goals: To be accomplished in 10 treatments:  1. Pt will increase FOTO score to 66 points to improve ability to perform ADLs.  Eval: 58 points  2. Pt will increase MMT B hip ER/IR to 4/5, B hip flex to 4+/5 to improve ability to ambulate with less pain.  Eval: B hip ER/IR 4-/5, B hip flex 4/5  3. Pt will increase AROM l/s flex/left SB to WNL with minimal to no increased pain in the low back to improve performance of daily household activities and chores.  Eval: l/s flex and left SB 75-100% of WNL with increased LBP/pulling  4. Pt will report being able to bend at work with mild to no  increased LBP to improve ability to perform work activities.   Eval: reports having most of her pain with bending    Frequency / Duration: Patient to be seen 1-2 times per week for 10 treatments.    Patient/ Caregiver education and instruction: Diagnosis, prognosis, self care, activity modification and exercises   [x]   Plan of care has been reviewed with PTA    Tandy Gaw, PT 07/19/2019 5:31 PM  _____________________________________________________________________  I certify that the above Therapy Services are being furnished while the patient is under my care. I agree with the treatment plan and certify that this therapy is necessary.    Physician's Signature:____________________  Date:__________Time:______    Please sign and return to [facility]  [address], [address]  [place], [place] [address]  [phone] fax

## 2019-07-19 NOTE — Progress Notes (Signed)
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    Rationale: increase ROM and increase strength to improve the patient???s ability to tolerate ADLs    8 min Self Care/Home Management: []   See flow sheet : pt education regarding anatomy and physiology of the spine/LEs/SIJ and how it relates to the pt's condition, pt education on avoiding activities that increase her radicular pain/symptoms   Rationale: increase ROM, increase strength and decrease pain/symptoms  to improve the patient???s ability to tolerate functional tasks.    3 min Manual Therapy:  In supine: right LE pull to correct right innominate upslip.    The manual therapy interventions were performed at a separate and distinct time from the therapeutic activities interventions.  Rationale: decrease pain, increase ROM, increase tissue extensibility and increase postural awareness to improve activity tolerance.           With   [x]  TE   []  TA   []  neuro   [x]  Other: Self Care/Home management Patient Education: [x]  Review HEP    []   Progressed/Changed HEP based on:   []  positioning   []  body mechanics   []  transfers   []  heat/ice application    []  other:      Other Objective/Functional Measures: See evaluation.     Pain Level (0-10 scale) post treatment: 1    ASSESSMENT/Changes in Function: Pt given HEP handout to perform. Pt understood exercises in HEP handout. Improvement in pelvic alignment and pain reported post manual interventions. Pt demonstrated decreased AROM, decreased strength, muscle tightness, tenderness to palpation, and impaired pelvic alignment. Pt would benefit from physical therapy to improve the above impairments to help the pt return to performing ADLs, functional and work activities.     Patient will continue to benefit from skilled PT services to modify and progress therapeutic interventions, address functional mobility deficits, address ROM deficits, address strength deficits, analyze and address soft tissue restrictions, analyze and cue movement patterns, analyze and modify body mechanics/ergonomics, assess and modify postural abnormalities, address imbalance/dizziness and instruct in home and community integration to attain remaining goals.     [x]   See Plan of Care  []   See progress note/recertification  []   See Discharge Summary         Progress towards goals / Updated goals:  Short Term Goals: To be accomplished in 2 treatments:  1. Pt will report compliance and independence to HEP  to help the pt manage their pain and symptoms.   Eval: established   Long Term Goals: To be accomplished in 10 treatments:  1. Pt will increase FOTO score to 66 points to improve ability to perform ADLs.  Eval: 58 points  2. Pt will increase MMT B hip ER/IR to 4/5, B hip flex to 4+/5 to improve ability to ambulate with less pain.  Eval: B hip ER/IR 4-/5, B hip flex 4/5  3. Pt will increase AROM l/s flex/left SB to WNL with minimal to no increased pain in the low back to improve performance of daily household activities and chores.   Eval:  l/s flex and left SB 75-100% of WNL with increased LBP/pulling  4. Pt will report being able to bend at work with mild to no increased LBP to improve ability to perform work activities.   Eval: reports having most of her pain with bending    PLAN  [x]   Upgrade activities as tolerated     [x]   Continue plan of care  [x]   Update interventions per flow sheet       []   Discharge due to:_  []   Other:_      Stephanie Coup, PT 07/19/2019  5:31 PM    Future Appointments   Date Time Provider [place]   08/07/2019  1:45 PM Creola Corn, MD [facility]

## 2019-07-19 NOTE — Progress Notes (Signed)
[facility] ??? 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      The Plan of Care and following information is based on the information from the initial evaluation.  Assessment/ key information:   Pt is a 53 year old female who presents to therapy today with LBP. Pt states that her initial symptoms are chronic in nature with insidious onset. She reports worsening of her symptoms in August/September 2020 with insidious onset as well with no injury/trauma. Pt reports worsening of her symptoms with bending and sometimes with walking/standing. Her pain can radiate into her B buttocks and posterior/lateral thighs. She denies any numbness/tingling into the LEs. Pt demonstrated decreased AROM, decreased strength, muscle tightness, tenderness to palpation, and impaired pelvic alignment. Pt would benefit from physical therapy to improve the above impairments to help the pt return to performing ADLs, functional and work activities.     Evaluation Complexity History HIGH Complexity :3+ comorbidities / personal factors will impact the outcome/ POC ; Examination HIGH Complexity :  4+ Standardized tests and measures addressing body structure, function, activity limitation and / or participation in recreation  ;Presentation MEDIUM Complexity : Evolving with changing characteristics  ;Clinical Decision Making MEDIUM Complexity : FOTO score of 26-74  Overall Complexity Rating: MEDIUM  Problem List: pain affecting function, decrease ROM, decrease strength, impaired gait/ balance, decrease ADL/ functional abilitiies, decrease activity tolerance, decrease flexibility/ joint mobility and decrease transfer abilities   Treatment Plan may include any combination of the following: Therapeutic exercise, Therapeutic activities, Neuromuscular re-education, Physical agent/modality, Gait/balance training, Manual therapy, Patient education, Self Care training, Functional mobility training, Home safety training and Stair training  Patient / Family readiness to learn indicated by: asking questions, trying to perform skills and interest  Persons(s) to be included in education: patient (P)  Barriers to Learning/Limitations: None  Patient Goal (s): ???relief from the pain???  Patient Self Reported Health Status: fair  Rehabilitation Potential: fair    Short Term Goals: To be accomplished in 2 treatments:  1. Pt will report compliance and independence to HEP to help the pt manage their pain and symptoms.   Eval: established   Long Term Goals: To be accomplished in 10 treatments:  1. Pt will increase FOTO score to 66 points to improve ability to perform ADLs.  Eval: 58 points  2. Pt will increase MMT B hip ER/IR to 4/5, B hip flex to 4+/5 to improve ability to ambulate with less pain.  Eval: B hip ER/IR 4-/5, B hip flex 4/5  3. Pt will increase AROM l/s flex/left SB to WNL with minimal to no increased pain in the low back to improve performance of daily household activities and chores.     Eval: l/s flex and left SB 75-100% of WNL with increased LBP/pulling  4. Pt will report being able to bend at work with mild to no  increased LBP to improve ability to perform work activities.   Eval: reports having most of her pain with bending    Frequency / Duration: Patient to be seen 1-2 times per week for 10 treatments.    Patient/ Caregiver education and instruction: Diagnosis, prognosis, self care, activity modification and exercises   [x]   Plan of care has been reviewed with PTA    , PT 07/19/2019 5:31 PM  _____________________________________________________________________  I certify that the above Therapy Services are being furnished while the patient is under my care. I agree with the treatment plan and certify that this therapy is necessary.    Physician's Signature:____________________  Date:__________Time:______    Please sign and return to [facility] ??? [facility]  [address]  [place], [place]  [phone] fax

## 2019-07-24 ENCOUNTER — Inpatient Hospital Stay: Admit: 2019-07-24 | Payer: PRIVATE HEALTH INSURANCE | Primary: Family Medicine

## 2019-07-24 DIAGNOSIS — M48062 Spinal stenosis, lumbar region with neurogenic claudication: Secondary | ICD-10-CM

## 2019-07-24 NOTE — Progress Notes (Signed)
 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 Pt reports she worked on Saturday and wonders if this contributed to this pain.     OBJECTIVE    Modality rationale: decrease pain and increase tissue extensibility to improve the patient's ability to tolerate functional activities.    Min Type Additional Details   10 [x]  Estim:  [x] Unatt       [x] IFC  [] Premod                        [] Other:  [] w/ice   [x] w/heat  Position: supine with LEs on wedge  Location: right SIJ and glute    []  Estim: [] Att    [] TENS instruct  [] NMES                    [] Other:  [] w/US    [] w/ice   [] w/heat  Position:  Location:    []   Traction: []  Cervical       [] Lumbar                       []  Prone          [] Supine                       [] Intermittent   [] Continuous Lbs:  []  before manual  []  after manual    []   Ultrasound: [] Continuous   []  Pulsed                           []   [] Location:  W/cm2:    []   Iontophoresis with dexamethasone          Location: []  Take home patch   []  In clinic    []   Ice     []   heat  []   Ice massage  []   Laser   []   Anodyne Position:  Location:    []   Laser with stim  []    Other: Position:  Location:    []   Vasopneumatic Device Pressure:       []  lo []  med []  hi   Temperature: []  lo []  med []  hi   [x]  Skin assessment post-treatment:  [x] intact [] redness- no adverse reaction    [] redness - adverse reaction:     10 min Therapeutic Exercise:  [x]  See flow sheet: stretches, pt education on performing KDO HEP exercise with tband to improve pain and strength.    Rationale: increase ROM and increase strength to improve the patient's ability to  tolerate ADLs.    21 min Neuromuscular Re-education:  [x]   See flow sheet : core and glute stability exercises.    Rationale: increase strength, improve coordination and increase proprioception  to improve the patient's ability to tolerate daily activities     13 min Manual Therapy: In supine: pelvic alignment and leg length corrections right LE pull to correct right innominate upslip, MET to correct right posterior/left anterior innominates; in right s/l: MET to correct sacral torsion.    The manual therapy interventions were performed at a separate and distinct time from the therapeutic activities interventions.  Rationale: decrease pain, increase ROM, increase tissue extensibility and increase postural awareness to improve activity tolerance.           With   [x]  TE   []  TA   [x]  neuro   []  Other:  Patient Education: [x]  Review HEP    []  Progressed/Changed HEP based on:   [x]  positioning   [x]  body mechanics   []  transfers   []  heat/ice application    []  other:      Other Objective/Functional Measures: Initiated exercises/interventions per flow sheet. Performed B piriformis stretch in IR position vs ER secondary to pain.      Pain Level (0-10 scale) post treatment: 3    ASSESSMENT/Changes in Function: Reported improvement in pain post session today. Improvement in pelvic alignment and leg length post manual but continued tenderness to the right SIJ and ASIS noted. Pain reported to the right hip/SIJ with PPT marches right>left LE. Reported having  less pain with KDO with tband than without tband. Continue POC as tolerated.     Patient will continue to benefit from skilled PT services to modify and progress therapeutic interventions, address functional mobility deficits, address ROM deficits, address strength deficits, analyze and address soft tissue restrictions, analyze and cue movement patterns, analyze and modify body mechanics/ergonomics, assess and modify postural abnormalities, address imbalance/dizziness and instruct in home and community integration to attain remaining goals.     []   See Plan of Care  []   See progress note/recertification  []   See Discharge Summary         Progress towards goals / Updated goals:  Short Term Goals: To be accomplished in 2 treatments:  1. Pt will report compliance and independence to HEP to help the pt manage their pain and symptoms.   Eval: established  Current: reports compliance 07/24/2019   Long Term Goals: To be accomplished in 10 treatments:  1. Pt will increase FOTO score to 66 points to improve ability to perform ADLs.  Eval: 58 points  2. Pt will increase MMT B hip ER/IR to 4/5, B hip flex to 4+/5 to improve ability to ambulate with less pain.  Eval: B hip ER/IR 4-/5, B hip flex 4/5  3. Pt will increase AROM l/s flex/left SB to WNL with minimal to no increased pain in the low back to improve performance of daily household activities and chores.   Eval: l/s flex and left SB 75-100% of WNL with increased LBP/pulling  4. Pt will report being able to bend at work with mild to no increased LBP to improve ability to perform work activities.   Eval: reports having most of her pain with bending    PLAN  [x]   Upgrade activities as tolerated     [x]   Continue plan of care  [x]   Update interventions per flow sheet       []   Discharge due to:_  []   Other:_      [name], PT 07/24/2019  5:31 PM    Future Appointments   Date Time Provider Department Center   07/31/2019 11:15 AM Shears Lauraine JINNY, PT [facility]   08/07/2019  10:30 AM Margurite Kins, PT [facility]   08/07/2019  1:45 PM Voncile Doralee HERO, MD [facility]   08/14/2019 10:30 AM Margurite Kins, PT [facility]

## 2019-07-24 NOTE — Progress Notes (Signed)
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 Pt reports she worked on Saturday and wonders if this contributed to this pain.     OBJECTIVE    Modality rationale: decrease pain and increase tissue extensibility to improve the patient???s ability to tolerate functional activities.    Min Type Additional Details   10 [x] Estim:  [x]Unatt       [x]IFC  []Premod                        []Other:  []w/ice   [x]w/heat  Position: supine with LEs on wedge  Location: right SIJ and glute    [] Estim: []Att    []TENS instruct  []NMES                    []Other:  []w/US   []w/ice   []w/heat  Position:  Location:    []  Traction: [] Cervical       []Lumbar                       [] Prone          []Supine                       []Intermittent   []Continuous Lbs:  [] before manual  [] after manual    []  Ultrasound: []Continuous   [] Pulsed                           []1MHz   []3MHz Location:  W/cm2:    []  Iontophoresis with dexamethasone         Location: [] Take home patch   [] In clinic    []  Ice     []  heat  []  Ice massage  []  Laser   []  Anodyne Position:  Location:    []  Laser with stim  []   Other: Position:  Location:    []  Vasopneumatic Device Pressure:       [] lo [] med [] hi   Temperature: [] lo [] med [] hi   [x] Skin assessment post-treatment:  [x]intact []redness- no adverse reaction    []redness ??? adverse reaction:     10 min Therapeutic Exercise:  [x] See flow sheet: stretches, pt education on performing KDO HEP exercise with tband to improve pain and strength.    Rationale: increase ROM and increase strength to improve the patient???s ability to   tolerate ADLs.    21 min Neuromuscular Re-education:  [x]  See flow sheet : core and glute stability exercises.    Rationale: increase strength, improve coordination and increase proprioception  to improve the patient???s ability to tolerate daily activities     13 min Manual Therapy: In supine: pelvic alignment and leg length corrections right LE pull to correct right innominate upslip, MET to correct right posterior/left anterior innominates; in right s/l: MET to correct sacral torsion.    The manual therapy interventions were performed at a separate and distinct time from the therapeutic activities interventions.  Rationale: decrease pain, increase ROM, increase tissue extensibility and increase postural awareness to improve activity tolerance.           With   [x] TE   [] TA   [x] neuro   [] Other:  Patient Education: [x] Review HEP    [] Progressed/Changed HEP based on:   [x] positioning   [x] body mechanics   [] transfers   [] heat/ice application    [] other:      Other Objective/Functional Measures: Initiated exercises/interventions per flow sheet. Performed B piriformis stretch in IR position vs ER secondary to pain.      Pain Level (0-10 scale) post treatment: 3    ASSESSMENT/Changes in Function: Reported improvement in pain post session today. Improvement in pelvic alignment and leg length post manual but continued tenderness to the right SIJ and ASIS noted. Pain reported to the right hip/SIJ with PPT marches right>left LE. Reported having  less pain with KDO with tband than without tband. Continue POC as tolerated.     Patient will continue to benefit from skilled PT services to modify and progress therapeutic interventions, address functional mobility deficits, address ROM deficits, address strength deficits, analyze and address soft tissue restrictions, analyze and cue movement patterns, analyze and modify body mechanics/ergonomics, assess and modify postural abnormalities, address imbalance/dizziness and instruct in home and community integration to attain remaining goals.     []  See Plan of Care  []  See progress note/recertification  []  See Discharge Summary         Progress towards goals / Updated goals:  Short Term Goals: To be accomplished in 2 treatments:  1. Pt will report compliance and independence to HEP to help the pt manage their pain and symptoms.   Eval: established  Current: reports compliance 07/24/2019   Long Term Goals: To be accomplished in 10 treatments:  1. Pt will increase FOTO score to 66 points to improve ability to perform ADLs.  Eval: 58 points  2. Pt will increase MMT B hip ER/IR to 4/5, B hip flex to 4+/5 to improve ability to ambulate with less pain.  Eval: B hip ER/IR 4-/5, B hip flex 4/5  3. Pt will increase AROM l/s flex/left SB to WNL with minimal to no increased pain in the low back to improve performance of daily household activities and chores.   Eval: l/s flex and left SB 75-100% of WNL with increased LBP/pulling  4. Pt will report being able to bend at work with mild to no increased LBP to improve ability to perform work activities.   Eval: reports having most of her pain with bending    PLAN  [x]  Upgrade activities as tolerated     [x]  Continue plan of care  [x]  Update interventions per flow sheet       []  Discharge due to:_  []    Other:_      Alejandra Barna J Karyme Mcconathy, PT 07/24/2019  5:31 PM    Future Appointments   Date Time Provider Department Center   07/31/2019 11:15 AM Rubye Strohmeyer J, PT [facility]   08/07/2019  10:30 AM McKee, Valerie, PT [facility]   08/07/2019  1:45 PM Arora, Reeta M, MD [facility]   08/14/2019 10:30 AM McKee, Valerie, PT [facility]

## 2019-07-31 ENCOUNTER — Inpatient Hospital Stay: Admit: 2019-07-31 | Payer: PRIVATE HEALTH INSURANCE | Primary: Family Medicine

## 2019-07-31 NOTE — Progress Notes (Signed)
 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 She reports her pain came back afterwards and it has been a weird week with the pain since the previous therapy visit.    OBJECTIVE    Modality rationale: decrease pain and increase tissue extensibility to improve the patient's ability to tolerate functional activities.    Min Type Additional Details   10 [x]  Estim:  [x] Unatt       [x] IFC  [] Premod                        [] Other:  [] w/ice   [x] w/heat  Position: supine with LEs on wedge  Location: right SIJ and glute    []  Estim: [] Att    [] TENS instruct  [] NMES                    [] Other:  [] w/US    [] w/ice   [] w/heat  Position:  Location:    []   Traction: []  Cervical       [] Lumbar                       []  Prone          [] Supine                       [] Intermittent   [] Continuous Lbs:  []  before manual  []  after manual    []   Ultrasound: [] Continuous   []  Pulsed                           []   [] Location:  W/cm2:    []   Iontophoresis with dexamethasone          Location: []  Take home patch   []  In clinic    []   Ice     []   heat  []   Ice massage  []   Laser   []   Anodyne Position:  Location:    []   Laser with stim  []   Other:  Position:  Location:    []   Vasopneumatic Device Pressure:       []  lo []  med []  hi   Temperature: []  lo []  med []  hi   [x]  Skin assessment post-treatment:  [x] intact [] redness- no adverse reaction    [] redness - adverse reaction:     31 min Neuromuscular Re-education:  [x]   See flow sheet : core and glute stability exercises.    Rationale: increase strength, improve coordination and increase proprioception  to improve the patient's ability to t5olerate daily activities     15  min Manual Therapy: In supine: pelvic alignment and leg length corrections right LE pull to correct right innominate upslip, MET to correct right anterior; in right s/l: MET to correct sacral torsion; in left s/l: DTM/TPR right glutes and right SIJ grade 1-2 PA mobs.    The manual therapy interventions were performed at a separate and distinct time from the therapeutic activities interventions.  Rationale: decrease pain, increase ROM, increase tissue extensibility and increase postural awareness to improve activity tolerance.           With   [x]  TE   []  TA   [x]  neuro   []  Other:  Patient Education: [x]  Review HEP    []  Progressed/Changed HEP based on:   [x]  positioning   [x]  body mechanics   []  transfers   []  heat/ice application    []  other:      Other Objective/Functional Measures: Improvement in pelvic alignment noted post MET to correct sacral torsion.      Pain Level (0-10 scale) post treatment: 1-2    ASSESSMENT/Changes in Function: Reported improvement in pain post session today. Pt tender and tight to palpation to the right glutes and SIJ with manual interventions. Pelvic alignment improved after MET but continued to have pain in the right SIJ region post MET. Added exercises per flow sheet to improve core stability and strength. She continues to be limited by elevated pain levels and impaired pelvic obliquity. Continue POC as tolerated.     Patient will continue to benefit from skilled PT services to modify and progress therapeutic  interventions, address functional mobility deficits, address ROM deficits, address strength deficits, analyze and address soft tissue restrictions, analyze and cue movement patterns, analyze and modify body mechanics/ergonomics, assess and modify postural abnormalities, address imbalance/dizziness and instruct in home and community integration to attain remaining goals.     []   See Plan of Care  []   See progress note/recertification  []   See Discharge Summary         Progress towards goals / Updated goals:  Short Term Goals: To be accomplished in 2 treatments:  1. Pt will report compliance and independence to HEP to help the pt manage their pain and symptoms.   Eval: established  Current: reports compliance 07/24/2019   Long Term Goals: To be accomplished in 10 treatments:  1. Pt will increase FOTO score to 66 points to improve ability to perform ADLs.  Eval: 58 points  2. Pt will increase MMT B hip ER/IR to 4/5, B hip flex to 4+/5 to improve ability to ambulate with less pain.  Eval: B hip ER/IR 4-/5, B hip flex 4/5  3. Pt will increase AROM l/s flex/left SB to WNL with minimal to no increased pain in the low back to improve performance of daily household activities and chores.   Eval: l/s flex and left SB 75-100% of WNL with increased LBP/pulling  4. Pt will report being able to bend at work with mild to no increased LBP to improve ability to perform work activities.   Eval: reports having most of her pain with bending    PLAN  [x]   Upgrade activities as tolerated     [x]   Continue plan of care  [x]   Update interventions per flow sheet       []   Discharge due to:_  []   Other:_      [name], PT 07/31/2019  5:31 PM    Future Appointments   Date Time Provider Department  Center   08/07/2019 10:30 AM Margurite Kins, PT [facility]   08/07/2019  1:45 PM Voncile Doralee HERO, MD [facility]   08/14/2019 10:30 AM Margurite Kins, PT [facility]

## 2019-07-31 NOTE — Progress Notes (Signed)
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 She reports her pain came back afterwards and it has been a "weird" week with the pain since the previous therapy visit.    OBJECTIVE    Modality rationale: decrease pain and increase tissue extensibility to improve the patient???s ability to tolerate functional activities.    Min Type Additional Details   10 '[x]'$  Estim:  '[x]'$ Unatt       '[x]'$ IFC  '[]'$ Premod                        '[]'$ Other:  '[]'$ w/ice   '[x]'$ w/heat  Position: supine with LEs on wedge  Location: right SIJ and glute    '[]'$  Estim: '[]'$ Att    '[]'$ TENS instruct  '[]'$ NMES                    '[]'$ Other:  '[]'$ w/US   '[]'$ w/ice   '[]'$ w/heat  Position:  Location:    '[]'$   Traction: '[]'$  Cervical       '[]'$ Lumbar                       '[]'$  Prone          '[]'$ Supine                       '[]'$ Intermittent   '[]'$ Continuous Lbs:  '[]'$  before manual  '[]'$  after manual    '[]'$   Ultrasound: '[]'$ Continuous   '[]'$  Pulsed                           '[]'$ 1MHz   '[]'$ 3MHz Location:  W/cm2:    '[]'$   Iontophoresis with dexamethasone         Location: '[]'$  Take home patch   '[]'$  In clinic    '[]'$   Ice     '[]'$   heat  '[]'$   Ice massage  '[]'$   Laser   '[]'$   Anodyne Position:  Location:    '[]'$   Laser with stim  '[]'$   Other: Position:   Location:    '[]'$   Vasopneumatic Device Pressure:       '[]'$  lo '[]'$  med '[]'$  hi   Temperature: '[]'$  lo '[]'$  med '[]'$  hi   '[x]'$  Skin assessment post-treatment:  '[x]'$ intact '[]'$ redness- no adverse reaction    '[]'$ redness ??? adverse reaction:     31 min Neuromuscular Re-education:  '[x]'$   See flow sheet : core and glute stability exercises.    Rationale: increase strength, improve coordination and increase proprioception  to improve the patient???s ability to t5olerate daily activities     15  min Manual Therapy: In supine: pelvic alignment and leg length corrections right LE pull to correct right innominate upslip, MET to correct right anterior; in right s/l: MET to correct sacral torsion; in left s/l: DTM/TPR right glutes and right SIJ grade 1-2 PA mobs.    The manual therapy interventions were performed at a separate and distinct time from the therapeutic activities interventions.  Rationale: decrease pain, increase ROM, increase tissue extensibility and increase postural awareness to improve activity tolerance.           With   _0  TE   _1  TA   _2  neuro   _3  Other:  Patient Education: _4  Review HEP    _5  Progressed/Changed HEP based on:   _6  positioning   _7  body mechanics   _8  transfers   <XFPKGYBNLWHKNZUD>_6<\/DQVHQITUYWXIPPND>_5  heat/ice application    <OPRAFOADLKZGFUQX>_4<\/FHSVEXOGACGBKORJ>_08  other:      Other Objective/Functional Measures: Improvement in pelvic alignment noted post MET to correct sacral torsion.      Pain Level (0-10 scale) post treatment: 1-2    ASSESSMENT/Changes in Function: Reported improvement in pain post session today. Pt tender and tight to palpation to the right glutes and SIJ with manual interventions. Pelvic alignment improved after MET but continued to have pain in the right SIJ region post MET. Added exercises per flow sheet to improve core stability and strength. She continues to be limited by elevated pain levels and impaired pelvic obliquity. Continue POC as tolerated.     Patient will continue to benefit from skilled PT services to modify and progress therapeutic  interventions, address functional mobility deficits, address ROM deficits, address strength deficits, analyze and address soft tissue restrictions, analyze and cue movement patterns, analyze and modify body mechanics/ergonomics, assess and modify postural abnormalities, address imbalance/dizziness and instruct in home and community integration to attain remaining goals.     _11   See Plan of Care  _12   See progress note/recertification  <LUDAPTCKFWBLTGAI>_9<\/KSMMOCAREQJEADGN>_35   See Discharge Summary         Progress towards goals / Updated goals:  Short Term Goals: To be accomplished in 2 treatments:  1. Pt will report compliance and independence to HEP to help the pt manage their pain and symptoms.   Eval: established  Current: reports compliance 07/24/2019   Long Term Goals: To be accomplished in 10 treatments:  1. Pt will increase FOTO score to 66 points to improve ability to perform ADLs.  Eval: 58 points  2. Pt will increase MMT B hip ER/IR to 4/5, B hip flex to 4+/5 to improve ability to ambulate with less pain.  Eval: B hip ER/IR 4-/5, B hip flex 4/5  3. Pt will increase AROM l/s flex/left SB to WNL with minimal to no increased pain in the low back to improve performance of daily household activities and chores.   Eval: l/s flex and left SB 75-100% of WNL with increased LBP/pulling  4. Pt will report being able to bend at work with mild to no increased LBP to improve ability to perform work activities.   Eval: reports having most of her pain with bending    PLAN  _14   Upgrade activities as tolerated     _15   Continue plan of care  _16   Update interventions per flow sheet       _17   Discharge due to:_  _18   Other:_      Stephanie Coup, PT 07/31/2019  5:31 PM    Future Appointments   Date Time Provider Department  Center   08/07/2019 10:30 AM Forrest Moron, PT [facility]   08/07/2019  1:45 PM Creola Corn, MD [facility]   08/14/2019 10:30 AM Forrest Moron, PT [facility]

## 2019-08-07 ENCOUNTER — Encounter: Attending: Physical Medicine & Rehabilitation | Primary: Family Medicine

## 2019-08-07 ENCOUNTER — Inpatient Hospital Stay: Admit: 2019-08-07 | Payer: PRIVATE HEALTH INSURANCE | Primary: Family Medicine

## 2019-08-07 NOTE — Progress Notes (Signed)
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                   Visits from Start of Care: 4    Missed Visits: 0    Status at Evaluation/Last Progress Note: initial eval and plan of care.   Progress towards Goals: She has HEP and reports compliance. She has C/O 9/10 migraine today and 4/10 LBP which is decreased from past 2 sessions. She continues with CCO LBP and pain on the right side and hip region. Patient will continue to benefit from skilled PT services to modify and progress therapeutic interventions, address ROM deficits, address strength deficits, analyze and address soft tissue restrictions, analyze and cue movement patterns, analyze and modify body mechanics/ergonomics, assess and modify postural abnormalities and instruct in home and community integration to attain remaining goals.  Goals: to be achieved in 6 treatments  :  1. Pt will increase FOTO score to 66 points to improve ability to perform ADLs.  PN NA, ongoing 08/07/19  2. Pt will increase MMT B hip ER/IR to 4/5, B hip flex to 4+/5 to improve ability to ambulate with less pain.  PN  ongoing and NA 08/07/19  3. Pt  will increase AROM l/s flex/left SB to WNL with minimal to no increased pain in the low back to improve performance of daily household activities and chores.   PN  ongoing and NA 08/07/19  4. Pt will report being able to bend at work with mild to no increased LBP to improve ability to perform work activities.   PN  no change reported, still "very painful." 08/07/19  ASSESSMENT/RECOMMENDATIONS:  [x] Continue therapy per initial plan/protocol at a frequency of  1-2 x per week for 6 treatments    [] Continue therapy with the following recommended changes:_____________________      _____________________________________________________________________  [] Discontinue therapy progressing towards or have reached established goals  [] Discontinue therapy due to lack of appreciable progress towards goals  [] Discontinue therapy due to lack of attendance or compliance  [] Await Physician's recommendations/decisions regarding therapy  [] Other:________________________________________________________________    Thank you for this referral. Katheran James, PT 08/07/2019 11:37 AM  NOTE TO PHYSICIAN:  PLEASE COMPLETE THE ORDERS BELOW AND   FAX TO [facility]: [phone]  If you are unable to process this request in 24 hours please contact our office: [phone]    ? I have read the above report and request that my patient continue as recommended.  ? I have read the above report and request that my patient continue therapy with the following changes/special instructions:_____________________________________  ? I have read the above report and request that my patient be discharged from therapy.  Physician's Signature:____________Date:_________TIME:________     [name], MD  ** Signature, Date and Time must be completed for valid certification **

## 2019-08-07 NOTE — Progress Notes (Signed)
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 I have a migraine and really don't know how much I am going to be able to do today. "  Back is feeling a little bit better."    OBJECTIVE    Modality rationale: decrease pain to improve the patient's ability to tolerate ADLS and activities     Type Additional Details   15 [x]  Estim:  [x] Unatt       [x] IFC  [] Premod                        [] Other:  [] w/ice   [] w/heat  Position:supine  Location:Right LS and hip.     []  Estim: [] Att    [] TENS instruct  [] NMES                    [] Other:  [] w/US   [] w/ice   [] w/heat  Position:  Location:    []   Traction: []  Cervical       [] Lumbar                       []  Prone          [] Supine                       [] Intermittent   [] Continuous Lbs:  []  before manual  []  after manual    []   Ultrasound: [] Continuous   []  Pulsed                           []   [] W/cm2:  Location:    []   Iontophoresis with dexamethasone         Location: []  Take home patch   []  In clinic    []   Ice     []   heat  []   Ice massage  []   Laser   []   Anodyne Position:  Location:    []   Laser with stim  []   Other:  Position:  Location:    []   Vasopneumatic Device Pressure:       []  lo []  med []  hi   Temperature: []  lo []  med []  hi   []   Skin assessment post-treatment:  [] intact [] redness- no adverse reaction    [] redness - adverse reaction:          10 min Therapeutic Exercise:  [x]  See flow sheet :   Rationale: increase ROM, increase strength and improve coordination to improve the patient's ability to tolerate ADLS and activities    8 min Therapeutic Activity:  [x]   See flow sheet :   Rationale: increase ROM,  increase strength and improve coordination  to improve the patient's ability to tolerate ADLs and activities     9 min Neuromuscular Re-education:  []   See flow sheet :   Rationale: increase ROM, increase strength and improve coordination  to improve the patient's ability to tolerate ADLS and activities       With   [x]  TE   [x]  TA   []  neuro   []  other: Patient Education: [x]  Review HEP    [x]  Progressed/Changed HEP based on:   []  positioning   []  body mechanics   []  transfers   []  heat/ice application    []  other:      Other Objective/Functional Measures: VC exercises and tech.,      Pain Level (0-10 scale) post treatment: better <4    ASSESSMENT/Changes in Function: patient C/O midgraine today. Tolerated exercises well including tbands with core emphasis.     Patient will continue to benefit from skilled PT services to modify and progress therapeutic interventions, address ROM deficits, address strength deficits, analyze and address soft tissue restrictions, analyze and cue movement patterns, analyze and modify body mechanics/ergonomics, assess and modify postural abnormalities and instruct in home and community integration to attain remaining goals.     [x]   See Plan of Care  [x]   See progress note/recertification  []   See Discharge Summary         Progress towards goals / Updated goals:  Short Term Goals: To be accomplished in 2 treatments:  1. Pt will report compliance and independence to HEP to help the pt manage their pain and symptoms.            Eval: established  Current: reports compliance 08/07/19  Long Term Goals: To be  accomplished in 10 treatments:  1. Pt will increase FOTO score to 66 points to improve ability to perform ADLs.  Eval: 58 points  CURRENT NA, ongoing 08/07/19  2. Pt will increase MMT B hip ER/IR to 4/5, B hip flex to 4+/5 to improve ability to ambulate with less pain.  Eval: B hip ER/IR 4-/5, B hip flex 4/5  CURRENT ongoing and NA 08/07/19  3. Pt will increase AROM l/s flex/left SB to WNL with minimal to no increased pain in the low back to improve performance of daily household activities and chores.   Eval: l/s flex and left SB 75-100% of WNL with increased LBP/pulling  CURRENT ongoing and NA 08/07/19  4. Pt will report being able to bend at work with mild to no increased LBP to improve ability to perform work activities.   Eval: reports having most of her pain with bending  CURRENT no change reported, still "very painful." 08/07/19    PLAN  [x]   Upgrade activities as tolerated     [x]   Continue plan of care  []   Update interventions per flow sheet       []   Discharge due to:_  []   Other:_      Katheran James, PT 08/07/2019  10:36 AM    Future Appointments   Date Time Provider Department Center   08/07/2019  1:45 PM [name], MD [facility]   08/14/2019 10:30 AM Katheran James, PT [facility]

## 2019-08-07 NOTE — Progress Notes (Signed)
[facility] ??? 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                   Visits from Start of Care: 4    Missed Visits: 0    Status at Evaluation/Last Progress Note: initial eval and plan of care.   Progress towards Goals: She has HEP and reports compliance. She has C/O 9/10 migraine today and 4/10 LBP which is decreased from past 2 sessions. She continues with CCO LBP and pain on the right side and hip region. Patient will continue to benefit from skilled PT services to modify and progress therapeutic interventions, address ROM deficits, address strength deficits, analyze and address soft tissue restrictions, analyze and cue movement patterns, analyze and modify body mechanics/ergonomics, assess and modify postural abnormalities and instruct in home and community integration to attain remaining goals.  Goals: to be achieved in 6 treatments  :  1. Pt will increase FOTO score to 66 points to improve ability to perform ADLs.  PN NA, ongoing 08/07/19  2. Pt will increase MMT B hip ER/IR to 4/5, B hip flex to 4+/5 to improve ability to ambulate with less pain.  PN  ongoing and NA 08/07/19  3. Pt  will increase AROM l/s flex/left SB to WNL with minimal to no increased pain in the low back to improve performance of daily household activities and chores.   PN  ongoing and NA 08/07/19  4. Pt will report being able to bend at work with mild to no increased LBP to improve ability to perform work activities.   PN  no change reported, still "very painful." 08/07/19  ASSESSMENT/RECOMMENDATIONS:  [x]Continue therapy per initial plan/protocol at a frequency of  1-2 x per week for 6 treatments    []Continue therapy with the following recommended changes:_____________________      _____________________________________________________________________  []Discontinue therapy progressing towards or have reached established goals  []Discontinue therapy due to lack of appreciable progress towards goals  []Discontinue therapy due to lack of attendance or compliance  []Await Physician's recommendations/decisions regarding therapy  []Other:________________________________________________________________    Thank you for this referral. Boomer Winders, PT 08/07/2019 11:37 AM  NOTE TO PHYSICIAN:  PLEASE COMPLETE THE ORDERS BELOW AND   FAX TO [facility]: [phone]  If you are unable to process this request in 24 hours please contact our office: [phone]    ? I have read the above report and request that my patient continue as recommended.  ? I have read the above report and request that my patient continue therapy with the following changes/special instructions:_____________________________________  ? I have read the above report and request that my patient be discharged from therapy.      Physician's Signature:____________Date:_________TIME:________     Creola Corn, MD  ** Signature, Date and Time must be completed for valid certification **

## 2019-08-07 NOTE — Progress Notes (Signed)
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 I have a migraine and really don't know how much I am going to be able to do today. "  Back is feeling a little bit better."    OBJECTIVE    Modality rationale: decrease pain to improve the patient???s ability to tolerate ADLS and activities     Type Additional Details   15 [x] Estim:  [x]Unatt       [x]IFC  []Premod                        []Other:  []w/ice   []w/heat  Position:supine  Location:Right LS and hip.     [] Estim: []Att    []TENS instruct  []NMES                    []Other:  []w/US   []w/ice   []w/heat  Position:  Location:    []  Traction: [] Cervical       []Lumbar                       [] Prone          []Supine                       []Intermittent   []Continuous Lbs:  [] before manual  [] after manual    []  Ultrasound: []Continuous   [] Pulsed                           []1MHz   []3MHz W/cm2:  Location:    []  Iontophoresis with dexamethasone         Location: [] Take home patch   [] In clinic    []  Ice     []  heat  []  Ice massage  []  Laser   []  Anodyne Position:  Location:    []  Laser with stim  []  Other:  Position:  Location:    []  Vasopneumatic Device Pressure:       [] lo [] med [] hi   Temperature: [] lo [] med [] hi   []  Skin assessment post-treatment:  []intact []redness- no adverse reaction    []redness ??? adverse reaction:          10 min Therapeutic Exercise:  [x] See flow sheet :   Rationale: increase ROM, increase strength and improve coordination to improve the patient???s ability to tolerate ADLS and activities    8 min Therapeutic Activity:  [x]  See flow sheet :   Rationale: increase ROM,   increase strength and improve coordination  to improve the patient???s ability to tolerate ADLs and activities     9 min Neuromuscular Re-education:  []   See flow sheet :   Rationale: increase ROM, increase strength and improve coordination  to improve the patient???s ability to tolerate ADLS and activities       With   [x]  TE   [x]  TA   []  neuro   []  other: Patient Education: [x]  Review HEP    [x]  Progressed/Changed HEP based on:   []  positioning   []  body mechanics   []  transfers   []  heat/ice application    []  other:      Other Objective/Functional Measures: VC exercises and tech.,      Pain Level (0-10 scale) post treatment: better <4    ASSESSMENT/Changes in Function: patient C/O midgraine today. Tolerated exercises well including tbands with core emphasis.     Patient will continue to benefit from skilled PT services to modify and progress therapeutic interventions, address ROM deficits, address strength deficits, analyze and address soft tissue restrictions, analyze and cue movement patterns, analyze and modify body mechanics/ergonomics, assess and modify postural abnormalities and instruct in home and community integration to attain remaining goals.     [x]   See Plan of Care  [x]   See progress note/recertification  []   See Discharge Summary         Progress towards goals / Updated goals:  Short Term Goals: To be accomplished in 2 treatments:  1. Pt will report compliance and independence to HEP to help the pt manage their pain and symptoms.            Eval: established  Current: reports compliance 08/07/19  Long Term Goals: To be  accomplished in 10 treatments:  1. Pt will increase FOTO score to 66 points to improve ability to perform ADLs.  Eval: 58 points  CURRENT NA, ongoing 08/07/19  2. Pt will increase MMT B hip ER/IR to 4/5, B hip flex to 4+/5 to improve ability to ambulate with less pain.  Eval: B hip ER/IR 4-/5, B hip flex 4/5  CURRENT ongoing and NA 08/07/19  3. Pt will increase AROM l/s flex/left SB to WNL with minimal to no increased pain in the low back to improve performance of daily household activities and chores.   Eval: l/s flex and left SB 75-100% of WNL with increased LBP/pulling  CURRENT ongoing and NA 08/07/19  4. Pt will report being able to bend at work with mild to no increased LBP to improve ability to perform work activities.   Eval: reports having most of her pain with bending  CURRENT no change reported, still "very painful." 08/07/19    PLAN  [x]   Upgrade activities as tolerated     [x]   Continue plan of care  []   Update interventions per flow sheet       []   Discharge due to:_  []   Other:_      , PT 08/07/2019  10:36 AM    Future Appointments   Date Time Provider Department Center   08/07/2019  1:45 PM , MD [facility] AMB   08/14/2019 10:30 AM , PT [id number]

## 2019-08-14 ENCOUNTER — Encounter: Payer: PRIVATE HEALTH INSURANCE | Primary: Family Medicine

## 2019-09-04 ENCOUNTER — Inpatient Hospital Stay: Admit: 2019-09-04 | Payer: PRIVATE HEALTH INSURANCE | Primary: Family Medicine

## 2019-09-04 DIAGNOSIS — M48062 Spinal stenosis, lumbar region with neurogenic claudication: Secondary | ICD-10-CM

## 2019-09-04 NOTE — Progress Notes (Signed)
 [facility]  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  Visits from Start of Care: 5    Missed Visits: 1    Established Goals:         Excellent           Good         Limited           None  [x]  Increased ROM   []   []   [x]   []   [x]  Increased Strength  []   [x]   []   []   [x]  Increased Mobility  []   []   [x]   []    [x]  Decreased Pain   []   []   [x]   []   []  Decreased Swelling  []   []   []   []     Key Functional Changes:   Goals: to be achieved in6 treatments:  1. Pt will increase FOTO score to 66 points to improve ability to perform ADLs.  PNNA, ongoing5/17/21  Current: not met, 49 points 09/04/2019  2. Pt will increase MMT B hip ER/IR to 4/5, B hip flex to 4+/5 to improve ability to ambulate with less pain.  PNongoing and NA 08/07/19  Current: MET, B hip flex 4+/5, B hip ER 4+/5, B hip IR 4/5 (with pressure on the greater trochanter region of the left hip) 09/04/2019  3. Pt will increase AROM l/s flex/left SB to WNL with minimal to no increased pain in the low back to improve performance of daily household activities and chores.  PNongoing and NA 08/07/19  Current: flex 50-75% of WNL with left LBP, left SB 50-75% of WNL with right LBP  09/04/2019  4. Pt will report being able to bend at work with mild to no increased LBP to improve ability to perform work activities.  PNno change reported, still very painful. 08/07/19  Current: not met, quite of bit of difficulty with bending per FOTO 09/04/2019    Updated Goals: to be achieved in 8 treatments:  Goals: to be achieved in6 treatments:  1. Pt will increase FOTO score to 66 points to improve ability to perform ADLs.  PN: 49 points   2 Pt will increase AROM l/s flex/left SB to WNL with minimal to no increased pain in the low back to improve performance of daily household activities and chores.  PN: flex 50-75% of WNL with left LBP, left SB 50-75% of WNL with right LBP  3. Pt will report being able to bend at work with mild to no increased LBP to improve ability to perform work activities.  PNquite of bit of difficulty with bending per FOTO  4. Pt will report an improvement in at worst pain to 6/10 to improve ambulation tolerance.   PN: 10/10 and states it is difficult for her to walk longer distances  because of pain.    ASSESSMENT/RECOMMENDATIONS:   Today is the pt's first f/u session since 08/07/2019 secondary to family issues/emergencies. She reports she was feeling better with therapy interventions prior to the lapse in therapy. She continues to report having LBP with daily activities/ADLs and B thigh/buttock pain. We will plan on continuing therapy 1-2x a week to improve the pt's pain/symptoms and the pt's functional ability.    [x] Continue therapy per initial plan/protocol at a frequency of  1-2 x per week for 8 treatments  [] Continue therapy with the following recommended changes:_____________________      _____________________________________________________________________  [] Discontinue therapy progressing towards or have reached established goals  [] Discontinue therapy due to lack of appreciable progress towards goals  [] Discontinue therapy due to lack of attendance or  compliance  [] Await Physician's recommendations/decisions regarding therapy  [] Other:________________________________________________________________    Thank you for this referral.   [name], PT 09/04/2019 12:55 PM  NOTE TO PHYSICIAN:  PLEASE COMPLETE THE ORDERS BELOW AND   FAX TO [facility]: [phone]  If you are unable to process this request in 24 hours please contact our office: [phone]    []   I have read the above report and request that my patient continue as recommended.  []   I have read the above report and request that my patient continue therapy with the following changes/special instructions:________________________________________  [] I have read the above report and request that my patient be discharged from therapy.    Physician's Signature:____________Date:_________TIME:________     Arora, Reeta M, MD  ** Signature, Date and Time must be completed for valid certification **

## 2019-09-04 NOTE — Progress Notes (Signed)
 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  Visits from Start of Care: 5    Missed Visits: 4  Reporting Period : 09/04/2019 to 10/02/2019    Summary of Care:  Goal: Pt will increase FOTO score to 66 points to improve ability to perform ADLs.  Status at last note/certification: unable to reassess   Status at discharge: not met    Goal: Pt will increase AROM l/s flex/left SB to WNL with minimal to no increased pain in the low back to improve performance of daily household activities and chores.  Status at last note/certification: unable to reassess   Status at discharge: not met    Goal: Pt will report being able to bend at work with mild to no increased LBP to improve ability to perform work activities.  Status at last note/certification: unable to reassess   Status at discharge: not met    Goal: Pt will report an improvement in at worst pain to 6/10 to improve ambulation tolerance.   Status at last note/certification: unable to reassess   Status at discharge: not met    ASSESSMENT/RECOMMENDATIONS:  Pt was seen for 5 therapy sessions. The pt NS her 10/02/2019 and therapist spoke to pt. Pt and therapist agreed to  d/c from therapy secondary to pt following up with the MD at the end of this month and financial issues. Pt is therefore d/c'ed from therapy and unable to reassess goals at this time.   [] Discontinue therapy progressing towards or have reached established goals  [] Discontinue therapy due to lack of appreciable progress towards goals  [x] Discontinue therapy due to lack of attendance or compliance  [x] Other: MD follow up and financial issues    Thank you for this referral.     [name], PT 10/02/2019 2:08 PM

## 2019-09-04 NOTE — Progress Notes (Signed)
 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     OBJECTIVE    Modality rationale: decrease pain to improve the patient's ability to tolerate ADLS and activities     Type Additional Details   15 [x]  Estim:  [x] Unatt       [x] IFC  [] Premod                        [] Other:  [] w/ice   [x] w/heat  Position: supine with LEs on wedge  Location: B low back    []  Estim: [] Att    [] TENS instruct  [] NMES                    [] Other:  [] w/US    [] w/ice   [] w/heat  Position:  Location:    []   Traction: []  Cervical       [] Lumbar                       []  Prone          [] Supine                       [] Intermittent   [] Continuous Lbs:  []  before manual  []  after manual    []   Ultrasound: [] Continuous   []  Pulsed                           []   [] W/cm2:  Location:    []   Iontophoresis with dexamethasone          Location: []  Take home patch   []  In clinic    []   Ice     []   heat  []   Ice massage  []   Laser   []   Anodyne Position:  Location:    []   Laser with stim  []   Other:  Position:  Location:    []   Vasopneumatic Device Pressure:       []  lo []  med []  hi   Temperature: []  lo []   med []  hi   [x]  Skin assessment post-treatment:  [x] intact [] redness- no adverse reaction    [] redness - adverse reaction:     32 min Therapeutic Activity:  [x]   See flow sheet : goal assessment, FOTO with pt, pt education on the anatomy/physiology of the l/s and SIJ region and how it relates to her pain/symptoms.    Rationale: increase ROM, increase strength and improve coordination  to improve the patient's ability to  tolerate functional tasks.     10 min Manual Therapy: In supine: pelvic alignment and leg length assessments, right LE pull to correct right innominate upslip, MET to correct right anterior; in right s/l: MET to correct sacral torsion.   The manual therapy interventions were performed at a separate and distinct time from the therapeutic activities interventions.  Rationale: decrease pain, increase ROM, increase tissue extensibility and increase postural awareness to improve ADL tolerance.        With   []  TE   [x]  TA   []  neuro   []  other: Patient Education: [x]  Review HEP    [x]  Progressed/Changed HEP based on:   []  positioning   []  body mechanics   []  transfers   []  heat/ice application    []  other:      Other Objective/Functional Measures: See goals below. At worst pain 10/10, at best pain 0/10.      Pain Level (0-10 scale) post treatment: 3    ASSESSMENT/Changes in Function: See PN. Pt reported improvement in pain post session and post manual interventions today. Today is the pt's first f/u session since 08/07/2019 secondary to family issues/emergencies. She reports she was feeling better with therapy interventions prior to the lapse in therapy. She continues to report having LBP with daily activities/ADLs and B thigh/buttock pain. We will plan on continuing therapy 1-2x a week to improve the pt's pain/symptoms and the pt's functional ability.      Patient will continue to benefit from skilled PT services to modify and progress therapeutic interventions, address ROM deficits, address strength  deficits, analyze and address soft tissue restrictions, analyze and cue movement patterns, analyze and modify body mechanics/ergonomics, assess and modify postural abnormalities and instruct in home and community integration to attain remaining goals.     []   See Plan of Care  [x]   See progress note/recertification  []   See Discharge Summary         Progress towards goals / Updated goals:  Goals: to be achieved in 6 treatments:  1. Pt will increase FOTO score to 66 points to improve ability to perform ADLs.  PN NA, ongoing5/17/21  Current: not met, 49 points 09/04/2019  2. Pt will increase MMT B hip ER/IR to 4/5, B hip flex to 4+/5 to improve ability to ambulate with less pain.  PN  ongoing and NA 08/07/19  Current: MET, B hip flex 4+/5, B hip ER 4+/5, B hip IR 4/5 (with pressure on the greater trochanter region of the left hip) 09/04/2019  3. Pt will increase AROM l/s flex/left SB to WNL with minimal to no increased pain in the low back to improve performance of daily household activities and chores.   PN ongoing and NA 08/07/19  Current: flex 50-75% of WNL with left LBP, left SB 50-75% of WNL with right LBP 09/04/2019  4. Pt will report being able to bend at work with mild to no increased LBP to improve ability to perform work activities.   PN  no change reported, still very painful. 08/07/19  Current: not met, quite of bit of difficulty with bending per FOTO 09/04/2019    PLAN  [x]   Upgrade activities as tolerated     [x]   Continue plan of care  [x]   Update interventions per flow sheet       []   Discharge due to:_  []   Other:_      [name], PT 09/04/2019  12:36 PM    Future  Appointments   Date Time Provider Department Center   09/13/2019  3:00 PM Voncile Doralee HERO, MD [facility] AMB

## 2019-09-08 ENCOUNTER — Other Ambulatory Visit: Payer: Self-pay | Admitting: Obstetrics & Gynecology

## 2019-09-08 DIAGNOSIS — Z1231 Encounter for screening mammogram for malignant neoplasm of breast: Secondary | ICD-10-CM

## 2019-09-13 ENCOUNTER — Ambulatory Visit
Admit: 2019-09-13 | Discharge: 2019-09-13 | Payer: PRIVATE HEALTH INSURANCE | Attending: Physical Medicine & Rehabilitation | Primary: Family Medicine

## 2019-09-13 ENCOUNTER — Ambulatory Visit: Attending: Physical Medicine & Rehabilitation | Primary: Internal Medicine

## 2019-09-13 DIAGNOSIS — M48062 Spinal stenosis, lumbar region with neurogenic claudication: Secondary | ICD-10-CM

## 2019-09-13 MED ORDER — DULOXETINE 30 MG CAP, DELAYED RELEASE
30 mg | ORAL_CAPSULE | ORAL | 1 refills | Status: DC
Start: 2019-09-13 — End: 2019-10-16

## 2019-09-13 NOTE — Progress Notes (Signed)
Verbal order entered per Dr. Wilford Corner as documented on blue sheet: 1. cymbalta 30 mg - 1 po daily with food. #30, 1 RF  2. Rx for LSO

## 2019-09-13 NOTE — Progress Notes (Signed)
Progress  Notes by [name], MD at 09/13/19 1500                Author: [name], MD  Service: --  Author Type: Physician       Filed: 09/18/19 1024  Encounter Date: 09/13/2019  Status: Signed          Editor: [name], MD (Physician)                     [facility]  [address], [address]   [place], [place] [address]   Phone: [phone]   Fax: [phone]         [name] [name], [name] [name]   DOB: 01-Jan-1966   PCP: Fabiola Backer, MD      PROGRESS NOTE      Diagnoses and all orders for this visit:      1. Lumbar stenosis with neurogenic claudication   -     DULoxetine (CYMBALTA) 30 mg capsule; 1 po daily with food   -     AMB SUPPLY ORDER             1.  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  Failed naproxen, Lodine.  Failed PT   2.  Trial Cymbalta 30 mg daily discussed symptoms of serotonin syndrome, rare and unlikely given low doses of amitriptyline and Cymbalta.   3.  Rx LSO, medically necessary, as needed use to offload painful structures   4.  Continue HEP from physical therapy   5.  Briefly discussed injections if medications not effective.  Surgery as a last resort.        Follow-up and Dispositions      ??  Return in about 1 month (around 10/13/2019) for med f/u, adjustment.                    Pain Assessment   09/13/2019        Location of Pain  Back     Severity of Pain  4     Quality of Pain  Throbbing;Other (Comment)     Quality of Pain Comment  Pressure     Duration of Pain  -     Frequency of Pain  Intermittent     Aggravating Factors  Walking;Bending     Aggravating Factors Comment  -     Limiting Behavior  Yes        Relieving Factors  Nothing           History of Present Illness:   [name] [name] is a  54 y.o.   female , LV sent to PT, trial Lodine, restart ELavil       Elavil helps with sleep but not during the day.  She has very limited standing tolerance.  Pain radiates primarily in her back  but sometimes into her buttocks.  No benefit with etodolac.      No neurogenic bowel or bladder changes.      Physical Exam:   NAD   TTP L4-L5, worse with extension   Mild difficulty with tandem gait.   LE motor intact   SLR negative   No edema         Visit Vitals      Pulse  95     Temp  97.8 ??F (36.6 ??C) (Temporal)     Ht  5\' 4"  (1.626 m)  Wt  284 lb 6.4 oz (129 kg)     SpO2  98%        BMI  48.82 kg/m??                 Pertinent past spine history:   L MRI 02/2019: Moderate stenosis L3-L4-L5   PT 2021 no significant benefit   Medications: Elavil helps with sleep   Failed medications: Lodine         Pertinent past medical history:   Asthma, Covid (11/2018      Work status:   Research scientist (medical) at [employer]                     We have informed [name] [name] to notify us for immediate appointment if she  has any worsening neurogical symptoms or if an emergency situation presents, then call 911.  Unintended errors may be present due to dragon medical dictation software.

## 2019-09-14 ENCOUNTER — Encounter: Payer: Commercial Managed Care - PPO | Admitting: Obstetrics & Gynecology

## 2019-09-18 ENCOUNTER — Encounter: Payer: PRIVATE HEALTH INSURANCE | Primary: Family Medicine

## 2019-09-18 ENCOUNTER — Other Ambulatory Visit: Payer: Self-pay

## 2019-09-18 ENCOUNTER — Ambulatory Visit: Payer: Commercial Managed Care - PPO | Admitting: Obstetrics & Gynecology

## 2019-09-18 ENCOUNTER — Encounter: Payer: Self-pay | Admitting: Obstetrics & Gynecology

## 2019-09-18 VITALS — BP 112/70 | Ht 60.25 in | Wt 164.0 lb

## 2019-09-18 DIAGNOSIS — Z6831 Body mass index (BMI) 31.0-31.9, adult: Secondary | ICD-10-CM

## 2019-09-18 DIAGNOSIS — Z780 Asymptomatic menopausal state: Secondary | ICD-10-CM

## 2019-09-18 DIAGNOSIS — Z01419 Encounter for gynecological examination (general) (routine) without abnormal findings: Secondary | ICD-10-CM

## 2019-09-18 DIAGNOSIS — Z1272 Encounter for screening for malignant neoplasm of vagina: Secondary | ICD-10-CM

## 2019-09-18 DIAGNOSIS — Z90710 Acquired absence of both cervix and uterus: Secondary | ICD-10-CM

## 2019-09-18 DIAGNOSIS — N890 Mild vaginal dysplasia: Secondary | ICD-10-CM | POA: Diagnosis not present

## 2019-09-18 DIAGNOSIS — E6609 Other obesity due to excess calories: Secondary | ICD-10-CM

## 2019-09-18 NOTE — Progress Notes (Signed)
Natalie Martinez 10/14/1965 [id number]   History:    54 y.o. G3P3L3 Single  RP:  Established patient presenting for annual gyn exam   HPI: S/P Total Hysterectomy for Dysplasia, no residual on patho. No pelvic pain. Normal vaginal secretions. 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  No pain with IC. Breasts wnl. BMI 31.76. Health labs with Fam MD. Harriet Masson 2021.   Past medical history,surgical history, family history and social history were all reviewed and documented in the EPIC chart.  Gynecologic History No LMP recorded. Patient has had a hysterectomy.  Obstetric History OB History  Gravida Para Term Preterm AB Living  3 3 3     3   SAB TAB Ectopic Multiple Live Births          3    # Outcome Date GA Lbr Len/2nd Weight Sex Delivery Anes PTL Lv  3 Term     F Vag-Spont  N LIV  2 Term     F Vag-Spont  N DEC  1 Term     M Vag-Spont  N LIV     ROS: A ROS was performed and pertinent positives and negatives are included in the history.  GENERAL: No fevers or chills. HEENT: No change in vision, no earache, sore throat or sinus congestion. NECK: No pain or stiffness. CARDIOVASCULAR: No chest pain or pressure. No palpitations. PULMONARY: No shortness of breath, cough or wheeze. GASTROINTESTINAL: No abdominal pain, nausea, vomiting or diarrhea, melena or bright red blood per rectum. GENITOURINARY: No urinary frequency, urgency, hesitancy or dysuria. MUSCULOSKELETAL: No joint or muscle pain, no back pain, no recent trauma. DERMATOLOGIC: No rash, no itching, no lesions. ENDOCRINE: No polyuria, polydipsia, no heat or cold intolerance. No recent change in weight. HEMATOLOGICAL: No anemia or easy bruising or bleeding. NEUROLOGIC: No headache, seizures, numbness, tingling or weakness. PSYCHIATRIC: No depression, no loss of interest in normal activity or change in sleep pattern.     Exam:   BP 112/70   Ht 5' 0.25" (1.53 m)   Wt 164 lb (74.4 kg)   BMI 31.76 kg/m    Body mass index is 31.76 kg/m.  General appearance : Well developed well nourished female. No acute distress HEENT: Eyes: no retinal hemorrhage or exudates,  Neck supple, trachea midline, no carotid bruits, no thyroidmegaly Lungs: Clear to auscultation, no rhonchi or wheezes, or rib retractions  Heart: Regular rate and rhythm, no murmurs or gallops Breast:Examined in sitting and supine position were symmetrical in appearance, no palpable masses or tenderness,  no skin retraction, no nipple inversion, no nipple discharge, no skin discoloration, no axillary or supraclavicular lymphadenopathy Abdomen: no palpable masses or tenderness, no rebound or guarding Extremities: no edema or skin discoloration or tenderness  Pelvic: Vulva: Normal             Vagina: No gross lesions or discharge.  Pap reflex done.  Cervix/Uterus absent  Adnexa  Without masses or tenderness  Anus: Normal   Assessment/Plan:  54 y.o. female for annual exam   1. Encounter for Papanicolaou smear of vagina as part of routine gynecological examination Gynecologic exam status post hysterectomy and menopause.  Pap reflex done on the vaginal vault.  Breast exam normal.  Screening mammogram June 2020, will repeat now.  Health labs with family physician.  2. History of total hysterectomy  3. VAIN I (vaginal intraepithelial neoplasia grade I) Vaginal Pap test done today.  4. Postmenopause Well on no hormone replacement therapy.  Recommend vitamin D supplements, calcium intake of 1200 mg daily and regular weightbearing physical activities.  5. Class 1 obesity due to excess calories without serious comorbidity with body mass index (BMI) of 31.0 to 31.9 in adult [place] and Intermittent fasting recommended.  Aerobic activities starting for times a week and light weightlifting every 2 days.  Other orders - diclofenac (VOLTAREN) 75 MG EC tablet; Take 75 mg by mouth 2 (two) times daily.  Princess Bruins MD, 4:18  PM 09/18/2019

## 2019-09-20 LAB — PAP IG W/ RFLX HPV ASCU

## 2019-09-22 ENCOUNTER — Encounter: Payer: Self-pay | Admitting: Obstetrics & Gynecology

## 2019-09-22 NOTE — Patient Instructions (Signed)
1. Encounter for Papanicolaou smear of vagina as part of routine gynecological examination Gynecologic exam status post hysterectomy and menopause.  Pap reflex done on the vaginal vault.  Breast exam normal.  Screening mammogram June 2020, will repeat now.  Health labs with family physician.  2. History of total hysterectomy  3. VAIN I (vaginal intraepithelial neoplasia grade I) Vaginal Pap test done today.  4. Postmenopause Well on no hormone replacement therapy.  Recommend vitamin D supplements, calcium intake of 1200 mg daily and regular weightbearing physical activities.  5. Class 1 obesity due to excess calories without serious comorbidity with body mass index (BMI) of 31.0 to 31.9 in adult [place] and Intermittent fasting recommended.  Aerobic activities starting for times a week and light weightlifting every 2 days.  Other orders - diclofenac (VOLTAREN) 75 MG EC tablet; Take 75 mg by mouth 2 (two) times daily.  Lavetta, it was a pleasure seeing you today!  I will inform you of your results as soon as they are available.

## 2019-09-26 ENCOUNTER — Encounter: Payer: PRIVATE HEALTH INSURANCE | Primary: Family Medicine

## 2019-09-26 ENCOUNTER — Other Ambulatory Visit: Payer: Self-pay | Admitting: Family Medicine

## 2019-09-26 DIAGNOSIS — E559 Vitamin D deficiency, unspecified: Secondary | ICD-10-CM

## 2019-10-02 ENCOUNTER — Encounter: Payer: PRIVATE HEALTH INSURANCE | Primary: Family Medicine

## 2019-10-09 ENCOUNTER — Encounter: Payer: PRIVATE HEALTH INSURANCE | Primary: Family Medicine

## 2019-10-16 ENCOUNTER — Ambulatory Visit
Admit: 2019-10-16 | Discharge: 2019-10-16 | Payer: PRIVATE HEALTH INSURANCE | Attending: Physical Medicine & Rehabilitation | Primary: Family Medicine

## 2019-10-16 ENCOUNTER — Ambulatory Visit: Attending: Physical Medicine & Rehabilitation | Primary: Internal Medicine

## 2019-10-16 ENCOUNTER — Other Ambulatory Visit: Payer: Self-pay

## 2019-10-16 ENCOUNTER — Ambulatory Visit
Admission: RE | Admit: 2019-10-16 | Discharge: 2019-10-16 | Disposition: A | Discharge status: Home / Self Care | Payer: Commercial Managed Care - PPO | Source: Ambulatory Visit | Attending: Obstetrics & Gynecology | Admitting: Obstetrics & Gynecology

## 2019-10-16 DIAGNOSIS — Z1231 Encounter for screening mammogram for malignant neoplasm of breast: Secondary | ICD-10-CM

## 2019-10-16 DIAGNOSIS — M48062 Spinal stenosis, lumbar region with neurogenic claudication: Secondary | ICD-10-CM

## 2019-10-16 MED ORDER — DULOXETINE 30 MG CAP, DELAYED RELEASE
30 mg | ORAL_CAPSULE | ORAL | 1 refills | Status: DC
Start: 2019-10-16 — End: 2020-04-15

## 2019-10-16 NOTE — Progress Notes (Signed)
1. Have you been to an emergency room or urgent care clinic since your last visit? No     Hospitalized since your last visit? If yes, where, when, and reason for visit? No     2. Have you seen or consulted any other health care providers outside of the [facility] system since your last visit including any procedures, health maintenance items. If yes, where, when and reason for visit? No            3 most recent PHQ Screens 10/16/2019   Little interest or pleasure in doing things Not at all   Feeling down, depressed, irritable, or hopeless Not at all   Total Score PHQ 2 0

## 2019-10-16 NOTE — Progress Notes (Signed)
Progress  Notes by [name], MD at 10/16/19 0915                Author: [name], MD  Service: --  Author Type: Physician       Filed: 10/16/19 1646  Encounter Date: 10/16/2019  Status: Signed          Editor: [name], MD (Physician)                        [facility]  [address], [address]   [place], [place] [address]   Phone: [phone]   Fax: [phone]            [name], [name]   DOB: 02-05-1966   PCP: Fabiola Backer, MD      PROGRESS NOTE         ASSESSMENT AND PLAN     Diagnoses and all orders for this visit:      1. Lumbar stenosis with neurogenic claudication   -     DULoxetine (CYMBALTA) 30 mg capsule; 1 po daily with food            1.  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  Would increase if needed in the future.   2.  Advised to continue HEP as tolerated   3.  Continue Cymbalta 30 mg.    4.  No indications for injections at this time   5.  May use Tylenol 2 g/day as needed        Follow-up and Dispositions      ??  Return in about 6 months (around 04/17/2020).                  HISTORY OF PRESENT ILLNESS        [name] [name] is a 54 y.o.  female presents for follow up for lower back and buttock pain. Pt reports an improvement in symptoms since last visit. She states her back pain  is better, though she continues to have some pinching pain. She was able to do her daily activities through her pain. She also has a short standing tolerance.  Pt states she is compliant with HEP and currently taking Cymbalta 30 mg, receiving benefit  from both. Denies red flags including persistent fevers, chills, weight changes, neurogenic bowel or bladder symptoms.  Denies any suicidal ideation or depression after starting Cymbalta.               Pain Assessment   10/16/2019        Location of Pain  Back     Severity of Pain  0     Quality of Pain  Aching;Other (Comment)      Quality of Pain Comment  pressure     Duration of Pain  Persistent     Frequency of Pain  Several times daily     Aggravating Factors  Other (Comment)     Aggravating Factors Comment  everything     Limiting Behavior  Some     Relieving Factors  Other (Comment)     Relieving Factors Comment  "stop moving"        Result of Injury  No           Pertinent past spine history:   L MRI 02/2019: Moderate stenosis L3-L4-L5   PT 2021 no significant benefit  Medications: Elavil helps with sleep, Cymbalta 30 mg   Failed medications: Lodine   ??   ??   Pertinent past medical history:   Asthma, Covid (11/2018   ??   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        Mild TTP bilateral sciatic notch, L>R   LE strength intact, SLR negative   Toe rise intact         Written by , ScribeKick, as dictated by Emmit Alexanders, MD.      I, Dr. Wynelle Beckmann, MD, confirm that all documentation is accurate.            Ms. Langlais may have a reminder  for a "due or due soon" health maintenance. I have asked that she contact her  primary care provider for follow-up on this health maintenance. This note was created using Dragon transcription software, unintended errors may be present.

## 2019-10-18 ENCOUNTER — Encounter

## 2019-10-30 ENCOUNTER — Inpatient Hospital Stay: Admit: 2019-10-30 | Payer: PRIVATE HEALTH INSURANCE | Attending: Family Medicine | Primary: Family Medicine

## 2019-10-30 DIAGNOSIS — E669 Obesity, unspecified: Secondary | ICD-10-CM

## 2019-10-30 NOTE — Progress Notes (Signed)
 Dear Dr. Brock:    Rainbow Salman is a 54 y.o. female seen today for nutrition counseling and Medical Nutrition Therapy for weight loss and healthy eating.      Ht:    64 in   Wt:   280 lb   BMI:  Body mass index is 48.06 kg/m.    Past Medical History includes:  Past Medical History:   Diagnosis Date   . Asthma    . COVID-19 11/2018   . Headache    . Hx of cholecystectomy 1999   . Hypertension    . Morbid obesity (HCC)        Current Medications:  Current Outpatient Medications   Medication Sig Dispense Refill   . DULoxetine (CYMBALTA) 30 mg capsule 1 po daily with food 90 Capsule 1   . amLODIPine (NORVASC) 5 mg tablet Take 5 mg by mouth daily.     SABRA amitriptyline (ELAVIL) 25 mg tablet Take 1 Tab by mouth nightly. (Patient not taking: Reported on 10/16/2019) 90 Tab 1   . multivitamin (ONE A DAY) tablet Take 1 Tab by mouth daily.     SABRA albuterol (PROAIR HFA) 90 mcg/actuation inhaler Take 2 Puffs by inhalation every six (6) hours as needed for Wheezing. Indications: ACUTE ASTHMA ATTACK 1 Inhaler 5   . cetirizine (ZYRTEC) 10 mg tablet Take 1 Tab by mouth daily. Indications: PERENNIAL ALLERGIC RHINITIS 30 Tab 5   . fluticasone (FLONASE) 50 mcg/actuation nasal spray 2 Sprays by Both Nostrils route daily. Indications: ALLERGIC RHINITIS 1 Bottle 5         Shawne Bulow doesn't smoke and rarely consumes alcohol.  She and her husband do the majority of the grocery shopping and food preparation at home and eats out times per week.     Patty Leitzke currently doesn't participate in any regular form of exercise.  She has previously attempted to lose weight which has not led to long-term weight management.    She desires to change her lifestyle through diet and exercise to promote weight loss.      Food records were reviewed at today's visit and suggestions were made for improvement. Benefits of keeping detailed daily food records for long term weight loss success were discussed.    Erminio Rummer usually eats 2-3   meals and 1-2 snacks a day.  She often times skips breakfast.  She will begin having a Premier Protein instead of skipping a meal.    While she was in the office today we downloaded and set-up the My Fitness Pal app on her phone.  She has agreed to use the My Fitness Pal app to track her intake and weight loss efforts    Introduced Katleen Kretsch to the [employer] for Weight Loss and the Plate Method for Meal Planning and provided written materials.    Practiced planning meals using food models to demonstrate use of the Plate Method and to visualize appropriate serving sizes. We also practiced reading food labels.    Reviewed selection criteria for acceptable protein supplements and provided written material/list.    Erminio Rummer is motivated to make lifestyle changes to improve her overall health and help her to lose weight.  She set several goals and these are attached for reference.      Danee Soller was a pleasure to work with today.  She has our contact information for questions as needed.    Miranda T. Shearin, RDN, CDCES    Erminio Lybrand's Nutrition  and Food Plan:    Pre-plan my meals/snacks to avoid poor food choices and quantities.  Eat 3 meals per day.  Eat within 1-2 hours of waking.  No greater than 4-5 hours between meals/snacks.  Focus on whole grains, fresh or unsweetened fruit, non-starchy vegetables, lean meats, low fat dairy (skim/1%), healthy fats, and low sodium food choices.  Eliminate sugar sweetened beverages.  Limit intake of sugar from snack foods.  Avoid fatty, fried, fast foods, and processed foods.    Recommended cooking methods: bake, broil, boil, grill, steam, microwave, or air fry.  Refer to written guidelines: Flexible Meal Planning for Weight Loss, My Plate Planner, Dining Out, and Label Reading.  Recommend 25-35 grams of Fiber per day.  Recommend 64-128 oz water  per day.  Maintain detailed daily food logs for awareness/accountability. (My Fitness Pal app)  Bring  food logs to each appointment with the Dietitian.  Establish an exercise schedule, minimum of 150 minutes of moderate intensity exercise per week.  Weigh myself weekly.  (NOTE:  Healthy weight loss is 1-2 pounds per week)    Call my Registered Dietitian Nutritionist, Miranda T. Shearin, RD, at [phone] if I have any questions.

## 2019-11-28 MED ORDER — DULOXETINE 60 MG CAP, DELAYED RELEASE
60 mg | ORAL_CAPSULE | Freq: Every day | ORAL | 0 refills | Status: DC
Start: 2019-11-28 — End: 2020-04-15

## 2019-11-28 NOTE — Telephone Encounter (Signed)
Patient called stating the 2 medications amitriptyline (ELAVIL) 25 mg tablet and DULoxetine (CYMBALTA) 30 mg capsule aren't working for her pain. She's asking what the next step should be now. Please advise patient back regarding this at [phone].

## 2019-11-28 NOTE — Telephone Encounter (Signed)
Per Dr. Bess Harvest note, will increase the cymbalta to 60 mg.  I will send in the rx.

## 2019-11-29 NOTE — Telephone Encounter (Signed)
I called and left a message for patient that per Dr. Wilford Corner we will increase the Cymbalta to 60mg  and it has been sent to her pharmacy.

## 2019-12-04 NOTE — Progress Notes (Signed)
NAME: [name] [name]  MEDICAL RECORD NUMBER: [id number]  DOB: 07-18-1965    Dear Fabiola Backer, MD:    [name] did not show up for a one hour individual appointment with the dietitian today.    We will make an attempt to reach them by phone to reschedule this appointment.  Your patient may call Outpatient Scheduling at [phone] to reschedule this appointment.    Sincerely,    Miranda T. Lanae Boast, RD   Diabetes and Nutrition Outpatient [facility]  [address] [place], [place] [address]  [phone]

## 2019-12-05 ENCOUNTER — Inpatient Hospital Stay: Payer: PRIVATE HEALTH INSURANCE | Attending: Family Medicine | Primary: Family Medicine

## 2019-12-22 ENCOUNTER — Other Ambulatory Visit: Payer: Self-pay | Admitting: Family Medicine

## 2019-12-22 DIAGNOSIS — E559 Vitamin D deficiency, unspecified: Secondary | ICD-10-CM

## 2019-12-22 DIAGNOSIS — E785 Hyperlipidemia, unspecified: Secondary | ICD-10-CM

## 2020-01-01 ENCOUNTER — Encounter: Payer: Self-pay | Admitting: Family Medicine

## 2020-01-01 ENCOUNTER — Other Ambulatory Visit: Payer: Self-pay

## 2020-01-01 ENCOUNTER — Ambulatory Visit (INDEPENDENT_AMBULATORY_CARE_PROVIDER_SITE_OTHER): Payer: Commercial Managed Care - PPO | Admitting: Family Medicine

## 2020-01-01 VITALS — BP 108/72 | HR 75 | Temp 97.9°F | Ht 60.0 in | Wt 164.4 lb

## 2020-01-01 DIAGNOSIS — E7800 Pure hypercholesterolemia, unspecified: Secondary | ICD-10-CM

## 2020-01-01 DIAGNOSIS — R0981 Nasal congestion: Secondary | ICD-10-CM | POA: Insufficient documentation

## 2020-01-01 DIAGNOSIS — Z0000 Encounter for general adult medical examination without abnormal findings: Secondary | ICD-10-CM | POA: Diagnosis not present

## 2020-01-01 DIAGNOSIS — E559 Vitamin D deficiency, unspecified: Secondary | ICD-10-CM | POA: Diagnosis not present

## 2020-01-01 DIAGNOSIS — F5101 Primary insomnia: Secondary | ICD-10-CM | POA: Diagnosis not present

## 2020-01-01 MED ORDER — MOMETASONE FUROATE 50 MCG/ACT NA SUSP: 2.0000 | Freq: Every day | NASAL | 2 refills | Status: DC

## 2020-01-01 MED ORDER — TRAZODONE HCL 50 MG PO TABS: 25.0000 mg | ORAL_TABLET | Freq: Every evening | ORAL | 3 refills | Status: DC | PRN

## 2020-01-01 NOTE — Progress Notes (Signed)
Established Patient Office Visit  Subjective:  Patient ID: Natalie Martinez, female    DOB: Feb 14, 1966  Age: 54 y.o. MRN: [id number]  CC:  Chief Complaint  Patient presents with   Annual Exam    CPE, no concerns.     HPI Natalie Martinez presents for a physical exam.  Seeing GYN for female care.  She is nonfasting today.  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  Continue Zocor for elevated cholesterol.  She uses Voltaren as needed for lower back pain.  She reports ongoing nasal congestion.  She denies sneezing watery eyes or postnasal drip.  She is using nose drops.  Complains of difficulty falling asleep.  Denies depression or anxiety.  Past Medical History:  Diagnosis Date   Allergy    Arthritis    Hypercholesterolemia    Neuromuscular disorder ([place])    sciatic pain    NSVD (normal spontaneous vaginal delivery)    X3   Vertigo     Past Surgical History:  Procedure Laterality Date   CERVICAL BIOPSY  W/ LOOP ELECTRODE EXCISION  2004  &  2005   X 2   DILATION AND CURETTAGE OF UTERUS  2000 & 2003   VAGINAL HYSTERECTOMY  2005   RECURRENT CERVICAL DYSPLASIA    Family History  Problem Relation Age of Onset   Diabetes Mother    Healthy Sister    Healthy Brother    Healthy Brother    Breast cancer Neg Hx    Colon cancer Neg Hx    Colon polyps Neg Hx    Esophageal cancer Neg Hx    Rectal cancer Neg Hx    Stomach cancer Neg Hx     Social History   Socioeconomic History   Marital status: Single    Spouse name: Not on file   Number of children: 3   Years of education: 15   Highest education level: Not on file  Occupational History   Occupation: Glass blower/designer  Tobacco Use   Smoking status: Never Smoker   Smokeless tobacco: Never Used  Scientific laboratory technician Use: Never used  Substance and Sexual Activity   Alcohol use: No    Alcohol/week: 0.0 standard drinks   Drug use: No   Sexual activity: Yes    Birth  control/protection: None, Surgical    Comment: 1st intercourse- 25, partners- 3,   Other Topics Concern   Not on file  Social History Narrative   Lives at home with her daughter and her mother.   Occasional caffeine use (coffee twice weekly).   Right-handed.   Social Determinants of Health   Financial Resource Strain:    Difficulty of Paying Living Expenses: Not on file  Food Insecurity:    Worried About Charity fundraiser in the Last Year: Not on file   [employer] of Food in the Last Year: Not on file  Transportation Needs:    Lack of Transportation (Medical): Not on file   Lack of Transportation (Non-Medical): Not on file  Physical Activity:    Days of Exercise per Week: Not on file   Minutes of Exercise per Session: Not on file  Stress:    Feeling of Stress : Not on file  Social Connections:    Frequency of Communication with Friends and Family: Not on file   Frequency of Social Gatherings with Friends and Family: Not on file   Attends Religious Services: Not on file   Active Member  of Clubs or Organizations: Not on file   Attends Archivist Meetings: Not on file   Marital Status: Not on file  Intimate Partner Violence:    Fear of Current or Ex-Partner: Not on file   Emotionally Abused: Not on file   Physically Abused: Not on file   Sexually Abused: Not on file    Outpatient Medications Prior to Visit  Medication Sig Dispense Refill   diclofenac (VOLTAREN) 75 MG EC tablet Take 75 mg by mouth 2 (two) times daily.      simvastatin (ZOCOR) 40 MG tablet TAKE 1 TABLET(40 MG) BY MOUTH AT BEDTIME 30 tablet 0   Vitamin D, Ergocalciferol, (DRISDOL) 1.25 MG (50000 UNIT) CAPS capsule TAKE 1 CAPSULE BY MOUTH EVERY 7 DAYS 5 capsule 0   No facility-administered medications prior to visit.    No Known Allergies  ROS Review of Systems  Constitutional: Negative for diaphoresis, fatigue, fever and unexpected weight change.  HENT: Positive for  congestion. Negative for postnasal drip, rhinorrhea, sinus pressure, sinus pain and sneezing.   Eyes: Negative for photophobia and visual disturbance.  Respiratory: Negative.   Cardiovascular: Negative.   Gastrointestinal: Negative.   Endocrine: Negative for polyphagia and polyuria.  Genitourinary: Negative.   Musculoskeletal: Positive for back pain.  Skin: Negative for pallor.  Allergic/Immunologic: Negative for immunocompromised state.  Neurological: Negative for tremors and speech difficulty.  Hematological: Does not bruise/bleed easily.  Psychiatric/Behavioral: Positive for sleep disturbance. Negative for dysphoric mood. The patient is not nervous/anxious.       Objective:    Physical Exam Vitals and nursing note reviewed.  Constitutional:      General: She is not in acute distress.    Appearance: Normal appearance. She is not ill-appearing, toxic-appearing or diaphoretic.  HENT:     Head: Normocephalic and atraumatic.     Right Ear: Tympanic membrane, ear canal and external ear normal.     Left Ear: Tympanic membrane, ear canal and external ear normal.     Nose: No congestion or rhinorrhea.     Mouth/Throat:     Mouth: Mucous membranes are moist.     Pharynx: Oropharynx is clear. No oropharyngeal exudate or posterior oropharyngeal erythema.  Eyes:     General:        Right eye: No discharge.        Left eye: No discharge.     Extraocular Movements: Extraocular movements intact.     Conjunctiva/sclera: Conjunctivae normal.     Pupils: Pupils are equal, round, and reactive to light.  Cardiovascular:     Rate and Rhythm: Normal rate and regular rhythm.  Pulmonary:     Effort: Pulmonary effort is normal.     Breath sounds: Normal breath sounds.  Abdominal:     General: Bowel sounds are normal. There is no distension.     Palpations: There is no mass.     Tenderness: There is no abdominal tenderness. There is no guarding or rebound.     Hernia: No hernia is present.    Musculoskeletal:     Cervical back: Normal range of motion. No rigidity or tenderness.     Right lower leg: No edema.  Lymphadenopathy:     Cervical: No cervical adenopathy.  Skin:    General: Skin is warm and dry.     Coloration: Skin is not jaundiced.  Neurological:     General: No focal deficit present.     Mental Status: She is alert and oriented to  person, place, and time.  Psychiatric:        Mood and Affect: Mood normal.        Behavior: Behavior normal.     BP 108/72    Pulse 75    Temp 97.9 F (36.6 C) (Tympanic)    Ht 5' (1.524 m)    Wt 164 lb 6.4 oz (74.6 kg)    SpO2 97%    BMI 32.11 kg/m  Wt Readings from Last 3 Encounters:  01/01/20 164 lb 6.4 oz (74.6 kg)  09/18/19 164 lb (74.4 kg)  04/17/19 164 lb (74.4 kg)     There are no preventive care reminders to display for this patient.  There are no preventive care reminders to display for this patient.  Lab Results  Component Value Date   TSH 1.70 06/21/2017   Lab Results  Component Value Date   WBC 6.2 10/20/2018   HGB 13.1 10/20/2018   HCT 39.5 10/20/2018   MCV 98.2 10/20/2018   PLT 208.0 10/20/2018   Lab Results  Component Value Date   NA 139 10/20/2018   K 4.3 10/20/2018   CO2 27 10/20/2018   GLUCOSE 88 10/20/2018   BUN 11 10/20/2018   CREATININE 0.63 10/20/2018   BILITOT 0.9 10/20/2018   ALKPHOS 58 10/20/2018   AST 41 (H) 10/20/2018   ALT 51 (H) 10/20/2018   PROT 6.9 10/20/2018   ALBUMIN 4.3 10/20/2018   CALCIUM 9.6 10/20/2018   GFR 99.72 10/20/2018   Lab Results  Component Value Date   CHOL 168 10/20/2018   Lab Results  Component Value Date   HDL 51.00 10/20/2018   Lab Results  Component Value Date   LDLCALC 91 10/20/2018   Lab Results  Component Value Date   TRIG 133.0 10/20/2018   Lab Results  Component Value Date   CHOLHDL 3 10/20/2018   No results found for: HGBA1C    Assessment & Plan:   Problem List Items Addressed This Visit      Other    Hypercholesterolemia   Relevant Orders   Comprehensive metabolic panel   Lipid panel   Healthcare maintenance - Primary   Relevant Orders   CBC   Comprehensive metabolic panel   Lipid panel   Urinalysis, Routine w reflex microscopic   Vitamin D deficiency   Relevant Orders   VITAMIN D 25 Hydroxy (Vit-D Deficiency, Fractures)   Nasal congestion   Relevant Medications   mometasone (NASONEX) 50 MCG/ACT nasal spray   Primary insomnia   Relevant Medications   traZODone (DESYREL) 50 MG tablet      Meds ordered this encounter  Medications   traZODone (DESYREL) 50 MG tablet    Sig: Take 0.5-1 tablets (25-50 mg total) by mouth at bedtime as needed for sleep.    Dispense:  30 tablet    Refill:  3   mometasone (NASONEX) 50 MCG/ACT nasal spray    Sig: Place 2 sprays into the nose daily.    Dispense:  1 each    Refill:  2    Follow-up: Return in about 1 year (around 12/31/2020), or Return fasting for above ordered blood work..   Patient was given information on health maintenance disease prevention in Spanish.  She will continue follow-up with GYN for female care.  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  Given information in Spanish on the insomnia.  Trial of trazodone.  Trial of Nasonex for chronic nasal congestion.  Warned not to use  nasal decongestants.  She will stop the Nasonex with any nosebleeding just use salt water nose spray. Libby Maw, MD

## 2020-01-01 NOTE — Patient Instructions (Signed)
Health Maintenance, Female Adopting a healthy lifestyle and getting preventive care are important in promoting health and wellness. Ask your health care provider about:  The right schedule for you to have regular tests and exams.  Things you can do on your own to prevent diseases and keep yourself healthy. What should I know about diet, weight, and exercise? Eat a healthy diet   Eat a diet that includes plenty of vegetables, fruits, low-fat dairy products, and lean protein.  Do not eat a lot of foods that are high in solid fats, added sugars, or sodium. Maintain a healthy weight Body mass index (BMI) is used to identify weight problems. It estimates body fat based on height and weight. Your health care provider can help determine your BMI and help you achieve or maintain a healthy weight. Get regular exercise Get regular exercise. This is one of the most important things you can do for your health. Most adults should:  Exercise for at least 150 minutes each week. The exercise should increase your heart rate and make you sweat (moderate-intensity exercise).  Do strengthening exercises at least twice a week. This is in addition to the moderate-intensity exercise.  Spend less time sitting. Even light physical activity can be beneficial. Watch cholesterol and blood lipids Have your blood tested for lipids and cholesterol at 52 years of age, then have this test every 5 years. Have your cholesterol levels checked more often if:  Your lipid or cholesterol levels are high.  You are older than 52 years of age.  You are at high risk for heart disease. What should I know about cancer screening? Depending on your health history and family history, you may need to have cancer screening at various ages. This may include screening for:  Breast cancer.  Cervical cancer.  Colorectal cancer.  Skin cancer.  Lung cancer. What should I know about heart disease, diabetes, and high blood  pressure? Blood pressure and heart disease  High blood pressure causes heart disease and increases the risk of stroke. This is more likely to develop in people who have high blood pressure readings, are of African descent, or are overweight.  Have your blood pressure checked: ? Every 3-5 years if you are 41-43 years of age. ? Every year if you are 52 years old or older. Diabetes Have regular diabetes screenings. This checks your fasting blood sugar level. Have the screening done:  Once every three years after age 32 if you are at a normal weight and have a low risk for diabetes.  More often and at a younger age if you are overweight or have a high risk for diabetes. What should I know about preventing infection? Hepatitis B If you have a higher risk for hepatitis B, you should be screened for this virus. Talk with your health care provider to find out if you are at risk for hepatitis B infection. Hepatitis C Testing is recommended for:  Everyone born from [birth year] through 1963.  Anyone with known risk factors for hepatitis C. Sexually transmitted infections (STIs)  Get screened for STIs, including gonorrhea and chlamydia, if: ? You are sexually active and are younger than 52 years of age. ? You are older than 52 years of age and your health care provider tells you that you are at risk for this type of infection. ? Your sexual activity has changed since you were last screened, and you are at increased risk for chlamydia or gonorrhea. Ask your health care provider if  you are at risk.  Ask your health care provider about whether you are at high risk for HIV. Your health care provider may recommend a prescription medicine to help prevent HIV infection. If you choose to take medicine to prevent HIV, you should first get tested for HIV. You should then be tested every 3 months for as long as you are taking the medicine. Pregnancy  If you are about to stop having your period (premenopausal) and  you may become pregnant, seek counseling before you get pregnant.  Take 400 to 800 micrograms (mcg) of folic acid every day if you become pregnant.  Ask for birth control (contraception) if you want to prevent pregnancy. Osteoporosis and menopause Osteoporosis is a disease in which the bones lose minerals and strength with aging. This can result in bone fractures. If you are 61 years old or older, or if you are at risk for osteoporosis and fractures, ask your health care provider if you should:  Be screened for bone loss.  Take a calcium or vitamin D supplement to lower your risk of fractures.  Be given hormone replacement therapy (HRT) to treat symptoms of menopause. Follow these instructions at home: Lifestyle  Do not use any products that contain nicotine or tobacco, such as cigarettes, e-cigarettes, and chewing tobacco. If you need help quitting, ask your health care provider.  Do not use street drugs.  Do not share needles.  Ask your health care provider for help if you need support or information about quitting drugs. Alcohol use  Do not drink alcohol if: ? Your health care provider tells you not to drink. ? You are pregnant, may be pregnant, or are planning to become pregnant.  If you drink alcohol: ? Limit how much you use to 0-1 drink a day. ? Limit intake if you are breastfeeding.  Be aware of how much alcohol is in your drink. In the [place] one drink equals one 12 oz bottle of beer (355 mL), one 5 oz glass of wine (148 mL), or one 1 oz glass of hard liquor (44 mL). General instructions  Schedule regular health, dental, and eye exams.  Stay current with your vaccines.  Tell your health care provider if: ? You often feel depressed. ? You have ever been abused or do not feel safe at home. Summary  Adopting a healthy lifestyle and getting preventive care are important in promoting health and wellness.  Follow your health care provider's instructions about healthy  diet, exercising, and getting tested or screened for diseases.  Follow your health care provider's instructions on monitoring your cholesterol and blood pressure. This information is not intended to replace advice given to you by your health care provider. Make sure you discuss any questions you have with your health care provider. Document Revised: 03/02/2018 Document Reviewed: 03/02/2018 Elsevier Patient Education  2020 [place] Maintenance, Female Adoptar un estilo de vida saludable y recibir atencin preventiva son importantes para promover la salud y Musician. Consulte al mdico sobre:  El esquema adecuado para hacerse pruebas y exmenes peridicos.  Cosas que puede hacer por su cuenta para prevenir enfermedades y [employer]. Qu debo saber sobre la dieta, el peso y el ejercicio? Consuma una dieta saludable   Consuma una dieta que incluya muchas verduras, frutas, productos lcteos con bajo contenido de [place] y Advertising account planner.  No consuma muchos alimentos ricos en grasas slidas, azcares agregados o sodio. Mantenga un peso  saludable El ndice de masa muscular [facility]) se [place] para identificar problemas de peso. Proporciona una estimacin de la grasa corporal basndose en el peso y la altura. Su mdico puede ayudarle a Radiation protection practitioner [place] y a Scientist, forensic o Theatre manager un peso saludable. Haga ejercicio con regularidad Haga ejercicio con regularidad. Esta es una de las prcticas ms importantes que puede hacer por su salud. La mayora de los adultos deben seguir estas pautas:  Optometrist, al menos, 141mnutos de actividad fsica por semana. El ejercicio debe aumentar la frecuencia cardaca y hNature conservation officertranspirar (ejercicio de intensidad moderada).  Hacer ejercicios de fortalecimiento por lo mHalliburton Companypor semana. Agregue esto a su plan de ejercicio de intensidad moderada.  Pasar menos tiempo sentados. Incluso la actividad  fsica ligera puede ser beneficiosa. Controle sus niveles de colesterol y lpidos en la sangre Comience a realizarse anlisis de lpidos y cResearch officer, trade unionen la sangre a los 20aos y luego reptalos cada 5aos. Hgase controlar los niveles de colesterol con mayor frecuencia si:  Sus niveles de lpidos y colesterol son altos.  Es mayor de 40aos.  Presenta un alto riesgo de padecer enfermedades cardacas. Qu debo saber sobre las pruebas de deteccin del cncer? Segn su historia clnica y sus antecedentes familiares, es posible que deba realizarse pruebas de deteccin del cncer en diferentes edades. Esto puede incluir pruebas de deteccin de lo siguiente:  Cncer de mama.  Cncer de cuello uterino.  Cncer colorrectal.  Cncer de piel.  Cncer de pulmn. Qu debo saber sobre la enfermedad cardaca, la diabetes y la hipertensin arterial? Presin arterial y enfermedad cardaca  La hipertensin arterial causa enfermedades cardacas y aSerbiael riesgo de accidente cerebrovascular. Es ms probable que esto se manifieste en las personas que tienen lecturas de presin arterial alta, tienen ascendencia africana o tienen sobrepeso.  Hgase controlar la presin arterial: ? Cada 3 a 5 aos si tiene entre 18 y 356aos. ? Todos los aos si es mayor de 4[place] Diabetes Realcese exmenes de deteccin de la diabetes con regularidad. Este anlisis revisa el nivel de azcar en la sangre en aMetamora Hgase las pruebas de deteccin:  Cada tresaos despus de los 467aosde edad si tiene un peso normal y un bajo riesgo de padecer diabetes.  Con ms frecuencia y a partir de uGranbyedad inferior si tiene sobrepeso o un alto riesgo de padecer diabetes. Qu debo saber sobre la prevencin de infecciones? Hepatitis B Si tiene un riesgo ms alto de contraer hepatitis B, debe someterse a un examen de deteccin de este virus. Hable con el mdico para averiguar si tiene riesgo de contraer la infeccin por  hepatitis B. Hepatitis C Se recomienda el anlisis a:  THexion Specialty Chemicals1945 y 1965.  Todas las personas que tengan un riesgo de haber contrado hepatitis C. Enfermedades de transmisin sexual (ETS)  Hgase las pruebas de dProgramme researcher, broadcasting/film/videode ITS, incluidas la gonorrea y la clamidia, si: ? Es sexualmente activa y es menor de [place] ? Es mayor de 24aos, y eInvestment banker, operationalinforma que corre riesgo de tener este tipo de infecciones. ? La actividad sexual ha cambiado desde que le hicieron la ltima prueba de deteccin y tiene un riesgo mayor de tBest boyclamidia o gRadio broadcast assistant Pregntele al mdico si usted tiene riesgo.  Pregntele al mdico si usted tiene un alto riesgo de cMuseum/gallery curatorVIH. El mdico tambin puede recomendarle un medicamento recetado para ayudar a evitar la infeccin por el VIH. Si elige tomar medicamentos para  prevenir el VIH, primero debe [employer] de deteccin del VIH. Luego debe hacerse anlisis cada 88mses mientras est tomando los medicamentos. Embarazo  Si est por dejar de mLibrarian, academic(fase premenopusica) y usted puede quedar eImmokalee busque asesoramiento antes de qBotswana  Tome de 400 a [id number](mcg) de cido fAnheuser-Buschsi qIreland  Pida mtodos de control de la natalidad (anticonceptivos) si desea evitar un embarazo no deseado. Osteoporosis y mBrazilLa osteoporosis es una enfermedad en la que los huesos pierden los minerales y la fuerza por el avance de la edad. El resultado pueden ser fracturas en los hNew Kingman-Butler Si tiene 65aos o ms, o si est en riesgo de sufrir osteoporosis y fracturas, pregunte a su mdico si debe:  Hacerse pruebas de deteccin de prdida sea.  Tomar un suplemento de calcio o de vitamina D para reducir el riesgo de fracturas.  Recibir terapia de reemplazo hormonal (TRH) para tratar los sntomas de la menopausia. Siga estas instrucciones en su casa: Estilo de vida  No consuma ningn producto  que contenga nicotina o tabaco, como cigarrillos, cigarrillos electrnicos y tabaco de mHigher education careers adviser Si necesita ayuda para dejar de fumar, consulte al mdico.  No consuma drogas.  No comparta agujas.  Solicite ayuda a su mdico si necesita apoyo o informacin para abandonar las drogas. Consumo de alcohol  No beba alcohol si: ? Su mdico le indica no hacerlo. ? Est embarazada, puede estar embarazada o est tratando de quedar embarazada.  Si bebe alcohol: ? Limite la cantidad que consume de 0 a 1 medida por da. ? Limite la ingesta si est amamantando.  Est atento a la cantidad de alcohol que hay en las bebidas que toma. En los [place] una medida equivale a una botella de cerveza de 12oz (3578m, un vaso de vino de 5oz (14845mo un vaso de una bebida alcohlica de alta graduacin de 1oz ([id number] generales  Realcese los estudios de rutina de la salud, dentales y de la vPublic librarianantSea Girtnfrmele a su mdico si: ? Se siente deprimida con frecuencia. ? Alguna vez ha sido vctima de maltLandover Hillso se siente segura en su casa. Resumen  Adoptar un estilo de vida saludable y recibir atencin preventiva son importantes para promover la salud y el bMusicianiga las instrucciones del mdico acerca de una dieta saludable, el ejercicio y la realizacin de pruebas o exmenes para deteEngineer, building servicesiga las instrucciones del mdico con respecto al control del colesterol y la presin arterial. Esta informacin no tiene comoMarine scientistconsejo del mdico. Asegrese de hacerle al mdico cualquier pregunta que tenga. Document Revised: 03/30/2018 Document Reviewed: 03/30/2018 Elsevier Patient Education  2020University Centeromnia El insomnio es un trastorno del sueo que causa dificultades para conciliar el sueo o para mantIrontonede producir fatiga, falta de energa, dificultad para concentrarse, cambios en el  estado de nimo y mal rendimiento escolar o laboral. Hay tres formas diferentes de clasificar el insomnio:  Dificultad para conciliar el sueo.  Dificultad para mantener el sueo.  Despertar muy precoz por la maana. Cualquier tipo de insomnio puede ser a largBarrister's clerknico) o a cortControl and instrumentation engineerudo). Ambos son frecuentes. Generalmente, el insomnio a corto plazo dura tres meses o menos tiempo. El crnico ocurre al menos tres veces por semana durante ms de tres meses. Cules son las causas? El insomnio puede deberse a otra afeccin, situacin o  sustancia, por ejemplo:  Ansiedad.  Ciertos medicamentos.  Enfermedad de reflujo gastroesofgico (ERGE) u otras enfermedades gastrointestinales.  Asma y otras enfermedades respiratorias.  Sndrome de las piernas inquietas, apnea del sueo u otros trastornos del sueo.  Dolor crnico.  Menopausia.  Accidente cerebrovascular.  Consumo excesivo de alcohol, tabaco u drogas ilegales.  Afecciones de salud mental, como depresin.  Cafena.  Trastornos neurolgicos, como enfermedad de Alzheimer.  Hiperactividad de la glndula tiroidea (hipertiroidismo). En ocasiones, la causa del insomnio puede ser desconocida. Qu incrementa el riesgo? Los factores de riesgo de tener insomnio incluyen lo siguiente:  Sexo. La enfermedad afecta ms a menudo a las mujeres que a los hombres.  Edad. El insomnio es ms frecuente a medida que una persona envejece.  Estrs.  La falta de actividad fsica.  Los horarios de trabajo irregulares o los turnos nocturnos.  Los viajes a lugares de diferentes zonas horarias.  Ciertas afecciones mdicas y de salud mental. Cules son los signos o los sntomas? Si tiene insomnio, el sntoma principal es la dificultad para conciliar el sueo o mantenerlo. Esto puede derivar en otros sntomas, por ejemplo:  Sentirse fatigado o tener poca energa.  Ponerse nervioso por [employer] irse a dormir.  No sentirse  descansado por la maana.  Tener dificultad para concentrarse.  Sentirse irritable, ansioso o deprimido. Cmo se diagnostica? Esta afeccin se puede diagnosticar en funcin de lo siguiente:  Los sntomas y antecedentes mdicos. El mdico puede hacerle preguntas sobre: ? Hbitos de sueo. ? Cualquier afeccin mdica que tenga. ? La salud mental.  Un examen fsico. Cmo se trata? El tratamiento para el insomnio depende de la causa. El tratamiento puede centrarse en tratar Ardelia Mems afeccin preexistente que causa el insomnio. El tratamiento tambin puede incluir lo siguiente:  Medicamentos que lo ayuden a dormir.  Asesoramiento psicolgico o terapia.  Ajustes en el estilo de vida para ayudarlo a dormir mejor. Siga estas indicaciones en su casa: Comida y bebida   Limite o evite el consumo de alcohol, bebidas con cafena y cigarrillos, especialmente cerca de la hora de [place], ya que pueden perturbarle el sueo.  No consuma una comida suculenta ni coma alimentos condimentados justo antes de la hora de [place]. Esto puede causarle molestias digestivas y dificultades para dormir. Hbitos de sueo   Lleve un registro del sueo ya que podra ser de utilidad para que usted y a su mdico puedan determinar qu podra estar causndole insomnio. Escriba los siguientes datos: ? Cundo duerme. ? Cundo se despierta durante la noche. ? Qu tan bien duerme. ? Qu tan relajado se siente al da siguiente. ? Cualquier efecto secundario de los [employer] toma. ? Lo que usted come y bebe.  Convierta su habitacin en un lugar oscuro, cmodo donde sea fcil conciliar el sueo. ? Coloque persianas o cortinas oscuras que impidan la entrada de la luz del exterior. ? Para bloquear los ruidos, use un aparato que reproduzca sonidos ambientales o relajantes de fondo. ? Mantenga baja la temperatura.  Limite el uso de pantallas antes de la hora de [place]. Esto incluye lo siguiente: ? Mirar  televisin. ? Usar el telfono inteligente, la tableta o la computadora.  Siga una rutina que incluya ir a dormir y Clinical cytogeneticist a la misma [place] y noche. Esto puede ayudarlo a conciliar el sueo ms rpidamente. Considere realizar [employer] tranquila, como leer, e incorporarla como parte de la rutina a la hora de irse a dormir.  Trate de Risk manager  durante el da para que pueda dormir mejor por la noche.  Levntese de la cama si sigue despierto despus de 9mnutos de haber intentado dormirse. MLeesburgluces, pero intente leer o hacer una actividad tranquila. Cuando tenga sueo, regrese a lFutures trader Instrucciones generales  TDelphide venta libre y los recetados solamente como se lo haya indicado el mdico.  Realice ejercicio con regularidad como se lo haya indicado el mdico. Evite la actividad fsica desde varias horas antes de irse a dormir.  Utilice tcnicas de relajacin para controlar el estrs. Pdale al mdico que le sugiera algunas tcnicas que sean adecuadas para usted. Estos pueden incluir lo siguiente: ? Ejercicios de respiracin. ? Rutinas para aliviar la tensin muscular. ? Visualizacin de escenas apacibles.  Conduzca con cuidado. No conduzca si est muy somnoliento.  Concurra a todas las visitas de control como se lo haya indicado el mdico. Esto es importante. Comunquese con un mdico si:  Est cansado durante todo eGames developer  Tiene dificultad en su rutina diaria debido a la somnolencia.  Sigue teniendo problemas para dormir o ePress photographer Solicite ayuda de inmediato si:  Piensa seriamente en lastimarse a usted mismo o a oNurse, children's Si alguna vez siente que puede lastimarse a usted mismo o a oProducer, television/film/video o tiene pensamientos de poner fin a su vida, busque ayuda de inmediato. Puede dirigirse al servicio de emergencias ms cercano o comunicarse con:  El servicio de emergencias de su localidad (911 en [place]  Una  lnea de asistencia al suicida y aFreight forwarderen crisis, como la LLincoln National Corporationde Prevencin del Suicidio (National Suicide Prevention Lifeline), al [phone] Est disponible las 24 horas del da. Resumen  El insomnio es un trastorno del sueo que causa dificultades para conciliar el sueo o para mCairnbrook  El insomnio puede ser a lBarrister's clerk(crnico) o a cControl and instrumentation engineer(agudo).  El tratamiento para el insomnio depende de la causa. El tratamiento puede centrarse en tratar uArdelia Memsafeccin preexistente que causa el insomnio.  Lleve un registro del sueo ya que podra ser de utilidad para que usted y a su mdico puedan determinar qu podra estar causndole insomnio. Esta informacin no tiene cMarine scientistel consejo del mdico. Asegrese de hacerle al mdico cualquier pregunta que tenga. Document Revised: 06/19/2017 Document Reviewed: 02/26/2017 Elsevier Patient Education  2Nikolskipreventivos en las mujeres de 421a 660aSpanish [id number] Old, Female Los cuidados preventivos hacen referencia a las opciones en cuanto a las visitas al mdico y al estilo de vida, las cuales pueden promover la salud y eMusician Esto puede comprender lo siguiente:  Un examen fsico anual. Esto tambin se puede llamar control de bienestar anual.  Visitas regulares al dentista y exmenes oculares.  Vacunas.  Estudios para dEngineer, building services  Opciones saludables de estilo de vida, como seguir una dieta saludable, hacer ejercicio regularmente, no usar drogas ni productos que contengan nicotina y tabaco, y limitar el consumo de alcohol. Qu puedo esperar para mi visita de cuidado preventivo? Examen fsico El mdico revisar lo siguiente:  EIT consultanty pFairport Harbor Esto se puede usar para calcular el ndice de masa corporal (IBagley, que indica si tiene un peso saludable.  Frecuencia cardaca y presin arterial.  Piel para detectar manchas  anormales. Asesoramiento Su mdico puede preguntarle acerca de:  Consumo de tabaco, alcohol y drogas.  Su bienestar emocional.  El bienestar en el hogar y sus relaciones personales.  Su actividad sexual.  Sus hbitos de alimentacin.  Su trabajo y Gutierrez laboral.  Mtodos anticonceptivos.  Su ciclo menstrual.  Sus antecedentes de Media planner. Qu vacunas necesito?  [place] antigripal  Se recomienda aplicarse esta vacuna todos los [place]. Vacuna contra el ttanos, difteria y tos ferina (Tdap)  Es posible que tenga que aplicarse un refuerzo contra el ttanos y la difteria (DT) cada 10aos. Vacuna contra la varicela  Es posible que tenga que aplicrsela si no recibi esta vacuna. Vacuna contra el herpes zster (culebrilla)  Es posible que la necesite despus de los 55 aos de [place]. Vacuna contra el sarampin, rubola y paperas (SRP)  Es posible que necesite aplicarse al menos una dosis de la vacuna SRP si naci despus de [phone]. Podra tambin necesitar una segunda dosis. Vacuna antineumoccica conjugada (PCV13)  Puede necesitar esta vacuna si tiene determinadas enfermedades y no se vacun anteriormente. [place] antineumoccica de polisacridos (PPSV23)  Quizs tenga que aplicarse una o dos dosis si fuma o si tiene determinadas afecciones. Edward Jolly antimeningoccica conjugada (MenACWY)  Puede necesitar esta vacuna si tiene determinadas afecciones. Vacuna contra la hepatitis A  Es posible que necesite esta vacuna si tiene ciertas afecciones o si viaja o trabaja en lugares en los que podra estar expuesta a la hepatitis A. Vacuna contra la hepatitis B  Es posible que necesite esta vacuna si tiene ciertas afecciones o si viaja o trabaja en lugares en los que podra estar expuesta a la hepatitis B. Vacuna antihaemophilus influenzae tipo B (Hib)  Puede necesitar esta vacuna si tiene determinadas afecciones. Vacuna contra el virus del papiloma humano (VPH)  Si el mdico se  lo recomienda, Research scientist (physical sciences) tres dosis a lo largo de 6 meses. Puede recibir las vacunas en forma de dosis individuales o en forma de dos o ms vacunas juntas en la misma inyeccin (vacunas combinadas). Hable con su mdico [employer] y beneficios de las vacunas combinadas. Qu pruebas necesito? Anlisis de [employer] de lpidos y colesterol. Estos se pueden verificar cada 5 aos o, con ms frecuencia, si usted tiene ms de 65 aos de edad.  Anlisis de hepatitisC.  Anlisis de hepatitisB. Pruebas de deteccin  Pruebas de deteccin de cncer de pulmn. Es posible que se le realice esta prueba de deteccin a partir de los 14 aos de edad, si ha fumado durante 30 aos un paquete diario y sigue fumando o dej el hbito en algn momento en los ltimos 15 aos.  Pruebas de Programme researcher, broadcasting/film/video de Surveyor, minerals. Todos los adultos a partir de los 13 aos de edad y [place] 41 aos de edad deben hacerse esta prueba de deteccin. El mdico puede recomendarle las pruebas de deteccin a partir de los 77 aos de edad si corre un mayor riesgo. Le realizarn pruebas cada 1 a 10 aos, segn los [place] y el tipo de prueba de Programme researcher, broadcasting/film/video.  Pruebas de deteccin de la diabetes. Esto se Set designer un control del azcar en la sangre (glucosa) despus de no haber comido durante un periodo de tiempo (ayuno). Es posible que se le realice esta prueba cada 1 a 3 aos.  Mamografa. Se puede realizar cada 1 o 2 aos. Hable con su mdico sobre cundo debe comenzar a Engineer, manufacturing de [place] regular. Esto depende de si tiene antecedentes familiares de cncer de mama o no.  Pruebas de deteccin de cncer relacionado con las mutaciones del BRCA. Es posible que se las deba realizar si tiene antecedentes de cncer de  mama, de ovario, de trompas o peritoneal.  Examen plvico y prueba de Papanicolaou. Esto se puede realizar cada 76aos a Renato Gails de los 21aos de edad. A partir de los 30 aos, esto se  puede Optometrist cada 5 aos si usted se realiza una prueba de Papanicolaou en combinacin con una prueba de deteccin del virus del papiloma humano (VPH). Otras pruebas  Anlisis de enfermedades de transmisin sexual (ETS).  Densitometra sea. Esto se realiza para detectar osteoporosis. Se le puede realizar este examen de deteccin si tiene un riesgo alto de tener osteoporosis. Siga estas instrucciones en su casa: Comida y bebida  Siga una dieta que incluya frutas frescas y verduras, cereales integrales, lcteos descremados y protenas magras.  Tome los suplementos vitamnicos y [employer] se lo haya indicado el mdico.  No beba alcohol si: ? Su mdico le indica no hacerlo. ? Est embarazada, puede estar embarazada o est tratando de quedar embarazada.  Si bebe alcohol: ? Limite la cantidad que consume de 0 a 1 medida por da. ? Est atenta a la cantidad de alcohol que hay en las bebidas que toma. En los [place], una medida equivale a una botella de cerveza de 12oz (359m), un vaso de vino de 5oz (1450m o un vaso de una bebida alcohlica de alta graduacin de 1oz (4436m Estilo de vidNavistar International Corporationlas encas a diario.  Mantngase activa. Haga al menos 68m61mos de ejercicio 5o ms das Hilton Hotelso consuma ningn producto que contenga nicotina o tabaco, como cigarrillos, cigarrillos electrnicos y tabaco de mascHigher education careers adviser necesita ayuda para dejar de fumar, consulte al mdico.  Si es sexualmente activa, practique sexo seguro. Use un condn u otra forma de mtodo anticonceptivo (anticonceptivos) a fin de evitEnvironmental health practitionerTS (infecciones de transmisin sexual).  Si el mdico se lo indic, tome una dosis baja de aspirina diariamente a partir de los 50 a85 de edadColleyvillendo volver?  Visite al mdico una vez al ao para una visita de control.  Pregntele al mdico con qu frecuencia debe realizarse un control de la vista y los dientes.  Mantenga  su esquema de vacunacin al da. Esta informacin no tiene comoMarine scientistconsejo del mdico. Asegrese de hacerle al mdico cualquier pregunta que tenga. Document Revised: 12/23/2017 Document Reviewed: 12/23/2017 Elsevier Patient Education  [email or web address]

## 2020-01-09 ENCOUNTER — Other Ambulatory Visit: Payer: Self-pay

## 2020-01-10 ENCOUNTER — Other Ambulatory Visit (INDEPENDENT_AMBULATORY_CARE_PROVIDER_SITE_OTHER): Payer: Commercial Managed Care - PPO

## 2020-01-10 DIAGNOSIS — E7800 Pure hypercholesterolemia, unspecified: Secondary | ICD-10-CM

## 2020-01-10 DIAGNOSIS — Z0000 Encounter for general adult medical examination without abnormal findings: Secondary | ICD-10-CM | POA: Diagnosis not present

## 2020-01-10 DIAGNOSIS — E559 Vitamin D deficiency, unspecified: Secondary | ICD-10-CM

## 2020-01-10 DIAGNOSIS — E785 Hyperlipidemia, unspecified: Secondary | ICD-10-CM

## 2020-01-10 LAB — URINALYSIS, ROUTINE W REFLEX MICROSCOPIC
Bilirubin Urine: NEGATIVE
Ketones, ur: NEGATIVE
Leukocytes,Ua: NEGATIVE
Nitrite: NEGATIVE
Specific Gravity, Urine: 1.015 (ref 1.000–1.030)
Total Protein, Urine: NEGATIVE
Urine Glucose: NEGATIVE
Urobilinogen, UA: 0.2 (ref 0.0–1.0)
pH: 6.5 (ref 5.0–8.0)

## 2020-01-10 LAB — COMPREHENSIVE METABOLIC PANEL
ALT: 20 U/L (ref 0–35)
AST: 20 U/L (ref 0–37)
Albumin: 4.3 g/dL (ref 3.5–5.2)
Alkaline Phosphatase: 68 U/L (ref 39–117)
BUN: 10 mg/dL (ref 6–23)
CO2: 30 mEq/L (ref 19–32)
Calcium: 9.6 mg/dL (ref 8.4–10.5)
Chloride: 103 mEq/L (ref 96–112)
Creatinine, Ser: 0.68 mg/dL (ref 0.40–1.20)
GFR: 100.58 mL/min (ref >60.00)
Glucose, Bld: 81 mg/dL (ref 70–99)
Potassium: 4.6 mEq/L (ref 3.5–5.1)
Sodium: 140 mEq/L (ref 135–145)
Total Bilirubin: 0.5 mg/dL (ref 0.2–1.2)
Total Protein: 6.8 g/dL (ref 6.0–8.3)

## 2020-01-10 LAB — LIPID PANEL
Cholesterol: 185 mg/dL (ref 0–200)
HDL: 42.2 mg/dL (ref >39.00)
LDL Cholesterol: 105 mg/dL — ABNORMAL HIGH (ref 0–99)
NonHDL: 143.11
Total CHOL/HDL Ratio: 4
Triglycerides: 189 mg/dL — ABNORMAL HIGH (ref 0.0–149.0)
VLDL: 37.8 mg/dL (ref 0.0–40.0)

## 2020-01-10 LAB — CBC
HCT: 40.7 % (ref 36.0–46.0)
Hemoglobin: 13.6 g/dL (ref 12.0–15.0)
MCHC: 33.5 g/dL (ref 30.0–36.0)
MCV: 95 fl (ref 78.0–100.0)
Platelets: 245 10*3/uL (ref 150.0–400.0)
RBC: 4.28 Mil/uL (ref 3.87–5.11)
RDW: 13 % (ref 11.5–15.5)
WBC: 5.2 10*3/uL (ref 4.0–10.5)

## 2020-01-10 LAB — VITAMIN D 25 HYDROXY (VIT D DEFICIENCY, FRACTURES): VITD: 30.84 ng/mL (ref 30.00–100.00)

## 2020-01-11 MED ORDER — VITAMIN D (ERGOCALCIFEROL) 1.25 MG (50000 UNIT) PO CAPS: ORAL_CAPSULE | ORAL | 5 refills | Status: DC

## 2020-01-11 MED ORDER — SIMVASTATIN 40 MG PO TABS: ORAL_TABLET | ORAL | 4 refills | Status: DC

## 2020-01-11 NOTE — Addendum Note (Signed)
Addended by: Jon Billings on: 01/11/2020 07:32 AM   Modules accepted: Orders

## 2020-01-16 ENCOUNTER — Other Ambulatory Visit: Payer: Self-pay

## 2020-01-16 ENCOUNTER — Ambulatory Visit: Payer: Commercial Managed Care - PPO | Admitting: Obstetrics & Gynecology

## 2020-01-16 ENCOUNTER — Encounter: Payer: Self-pay | Admitting: Obstetrics & Gynecology

## 2020-01-16 VITALS — BP 124/80

## 2020-01-16 DIAGNOSIS — L738 Other specified follicular disorders: Secondary | ICD-10-CM

## 2020-01-16 DIAGNOSIS — R300 Dysuria: Secondary | ICD-10-CM

## 2020-01-16 DIAGNOSIS — Z113 Encounter for screening for infections with a predominantly sexual mode of transmission: Secondary | ICD-10-CM | POA: Diagnosis not present

## 2020-01-16 MED ORDER — SULFAMETHOXAZOLE-TRIMETHOPRIM 800-160 MG PO TABS: 1.0000 | ORAL_TABLET | Freq: Two times a day (BID) | ORAL | 0 refills | Status: AC

## 2020-01-16 NOTE — Progress Notes (Signed)
° ° °  Kemisha Bonnette [birth year] [id number]        54 y.o.  G3P3L3  Boyfriend  RP: Dysuria with urinary frequency  HPI: Dysuria with urinary frequency.  No blood seen in urine.  No back pain.  No fever.  S/P Total Hysterectomy.  Rt mid vulvar tender bump x a few days.  No pelvic pain.  No vaginal discharge.   OB History  Gravida Para Term Preterm AB Living  3 3 3     3   SAB TAB Ectopic Multiple Live Births          3    # Outcome Date GA Lbr Len/2nd Weight Sex Delivery Anes PTL Lv  3 Term     F Vag-Spont  N LIV  2 Term     F Vag-Spont  N DEC  1 Term     M Vag-Spont  N LIV    Past medical history,surgical history, problem list, medications, allergies, family history and social history were all reviewed and documented in the EPIC chart.   Directed ROS with pertinent positives and negatives documented in the history of present illness/assessment and plan.  Exam:  Vitals:   01/16/20 1611  BP: 124/80   General appearance:  Normal  CVAT Negative  Gynecologic exam: Vulva:  Rt mid vulvar infected Sebaceous gland.  Pus drained with pressure.  Speculum:  Vagina normal.  Secretions normal.  Gono-Chlam done.  U/A: Yellow clear, protein negative, nitrite negative, white blood cells 6-10, red blood cells 3-10, bacteria few.  Urine culture pending.   Assessment/Plan:  54 y.o. G3P3003   1. Dysuria Possible acute cystitis.  No allergy to any antibiotic.  Will treat with Bactrim DS 1 tablet per mouth twice daily for 3 days.  Usage reviewed and prescription sent to pharmacy.  Pending urine culture.  Recommend good hydration with water. - Urinalysis,Complete w/RFL Culture  2. Infected sebaceous gland Small infected sebaceous's gland at the right mid vulva.  Pus drained easily with pressure.  Patient reassured.  Sitz bath with warm water recommended daily.  3. Screen for STD (sexually transmitted disease) Recommend condom use. - Gono-Chlam - HIV antibody (with reflex) - RPR -  Hepatitis B Surface AntiGEN - Hepatitis C Antibody  Other orders - sulfamethoxazole-trimethoprim (BACTRIM DS) 800-160 MG tablet; Take 1 tablet by mouth 2 (two) times daily for 3 days.  Princess Bruins MD, 4:28 PM 01/16/2020

## 2020-01-17 NOTE — Addendum Note (Signed)
Addended by: Thurnell Garbe A on: 01/17/2020 09:31 AM   Modules accepted: Orders

## 2020-01-18 LAB — C. TRACHOMATIS/N. GONORRHOEAE RNA
C. trachomatis RNA, TMA: NOT DETECTED
N. gonorrhoeae RNA, TMA: NOT DETECTED

## 2020-01-19 ENCOUNTER — Encounter

## 2020-01-19 ENCOUNTER — Inpatient Hospital Stay: Admit: 2020-01-19 | Payer: PRIVATE HEALTH INSURANCE | Primary: Family Medicine

## 2020-01-19 DIAGNOSIS — Z01812 Encounter for preprocedural laboratory examination: Secondary | ICD-10-CM

## 2020-01-19 LAB — URINALYSIS, COMPLETE W/RFL CULTURE
Bilirubin Urine: NEGATIVE
Glucose, UA: NEGATIVE
Hyaline Cast: NONE SEEN /LPF
Ketones, ur: NEGATIVE
Leukocyte Esterase: NEGATIVE
Nitrites, Initial: NEGATIVE
Protein, ur: NEGATIVE
Specific Gravity, Urine: 1.015 (ref 1.001–1.03)
pH: 6 (ref 5.0–8.0)

## 2020-01-19 LAB — URINE CULTURE
MICRO NUMBER:: 11119602
SPECIMEN QUALITY:: ADEQUATE

## 2020-01-19 LAB — RPR: RPR Ser Ql: NONREACTIVE

## 2020-01-19 LAB — HEPATITIS C ANTIBODY
Hepatitis C Ab: NONREACTIVE
SIGNAL TO CUT-OFF: 0.03 (ref <1.00)

## 2020-01-19 LAB — CULTURE INDICATED

## 2020-01-19 LAB — HIV ANTIBODY (ROUTINE TESTING W REFLEX): HIV 1&2 Ab, 4th Generation: NONREACTIVE

## 2020-01-19 LAB — HEPATITIS B SURFACE ANTIGEN: Hepatitis B Surface Ag: NONREACTIVE

## 2020-01-20 LAB — COVID-19: SARS-CoV-2: NOT DETECTED

## 2020-01-20 LAB — NOVEL CORONAVIRUS (COVID-19): SARS-CoV-2: NOT DETECTED

## 2020-01-25 ENCOUNTER — Inpatient Hospital Stay: Payer: PRIVATE HEALTH INSURANCE

## 2020-01-25 MED ORDER — FAMOTIDINE 20 MG TAB
20 mg | Freq: Once | ORAL | Status: AC
Start: 2020-01-25 — End: 2020-01-25
  Administered 2020-01-25: 11:00:00 via ORAL

## 2020-01-25 MED ORDER — LIDOCAINE (PF) 10 MG/ML (1 %) IJ SOLN
10 mg/mL (1 %) | INTRAMUSCULAR | Status: DC | PRN
Start: 2020-01-25 — End: 2020-01-25

## 2020-01-25 MED ORDER — SODIUM CHLORIDE 0.9 % IJ SYRG
INTRAMUSCULAR | Status: DC | PRN
Start: 2020-01-25 — End: 2020-01-25

## 2020-01-25 MED ORDER — SODIUM CHLORIDE 0.9 % IJ SYRG
Freq: Three times a day (TID) | INTRAMUSCULAR | Status: DC
Start: 2020-01-25 — End: 2020-01-25

## 2020-01-25 MED ORDER — LIDOCAINE (PF) 20 MG/ML (2 %) IJ SOLN
20 mg/mL (2 %) | INTRAMUSCULAR | Status: AC
Start: 2020-01-25 — End: ?

## 2020-01-25 MED ORDER — PROPOFOL 10 MG/ML IV EMUL
10 mg/mL | INTRAVENOUS | Status: AC
Start: 2020-01-25 — End: ?

## 2020-01-25 MED ORDER — LIDOCAINE (PF) 20 MG/ML (2 %) IJ SOLN
20 mg/mL (2 %) | INTRAMUSCULAR | Status: DC | PRN
Start: 2020-01-25 — End: 2020-01-25
  Administered 2020-01-25: 13:00:00 via INTRAVENOUS

## 2020-01-25 MED ORDER — LACTATED RINGERS IV
INTRAVENOUS | Status: DC
Start: 2020-01-25 — End: 2020-01-25
  Administered 2020-01-25: 11:00:00 via INTRAVENOUS

## 2020-01-25 MED ORDER — PROPOFOL 10 MG/ML IV EMUL
10 mg/mL | INTRAVENOUS | Status: DC | PRN
Start: 2020-01-25 — End: 2020-01-25
  Administered 2020-01-25 (×5): via INTRAVENOUS

## 2020-01-25 MED FILL — XYLOCAINE-MPF 20 MG/ML (2 %) INJECTION SOLUTION: 20 mg/mL (2 %) | INTRAMUSCULAR | Qty: 5

## 2020-01-25 MED FILL — LACTATED RINGERS IV: INTRAVENOUS | Qty: 1000

## 2020-01-25 MED FILL — DIPRIVAN 10 MG/ML INTRAVENOUS EMULSION: 10 mg/mL | INTRAVENOUS | Qty: 20

## 2020-01-25 MED FILL — BD POSIFLUSH NORMAL SALINE 0.9 % INJECTION SYRINGE: INTRAMUSCULAR | Qty: 40

## 2020-01-25 MED FILL — FAMOTIDINE 20 MG TAB: 20 mg | ORAL | Qty: 1

## 2020-01-25 NOTE — H&P (Signed)
[facility] [place]   [email or web address]      Impression:   1.crcs    Plan:     1. Colo mac all risks benefits and alt discussed       Chief Complaint: crcs      HPI:  [name] [name] is a 54 y.o. female who is being seen on consult for crcs.    PMH:   Past Medical History:   Diagnosis Date   ??? Asthma    ??? COVID-19 11/2018   ??? Headache    ??? Hx of cholecystectomy 1999   ??? Hypertension    ??? Morbid obesity ([place])        PSH:   Past Surgical History:   Procedure Laterality Date   ??? HX APPENDECTOMY  2006   ??? HX CESAREAN SECTION  1987, 1994, 1998   ??? [place]   ??? HX HYSTERECTOMY     ??? HX HYSTERECTOMY  2012       Social HX:   Social History     Socioeconomic History   ??? Marital status: MARRIED     Spouse name: Not on file   ??? Number of children: Not on file   ??? Years of education: Not on file   ??? Highest education level: Not on file   Occupational History   ??? Not on file   Tobacco Use   ??? Smoking status: Never Smoker   ??? Smokeless tobacco: Never Used   Substance and Sexual Activity   ??? Alcohol use: Yes     Alcohol/week: 1.0 standard drink     Types: 1 Glasses of wine per week     Comment: once per month   ??? Drug use: No   ??? Sexual activity: Yes     Partners: Male     Birth control/protection: Surgical   Other Topics Concern   ??? Military Service Not Asked   ??? Blood Transfusions Not Asked   ??? Caffeine Concern Not Asked   ??? Occupational Exposure Not Asked   ??? Hobby Hazards Not Asked   ??? Sleep Concern Not Asked   ??? Stress Concern Not Asked   ??? Weight Concern Not Asked   ??? Special Diet Not Asked   ??? Back Care Not Asked   ??? Exercise Not Asked   ??? Bike Helmet Not Asked   ??? Seat Belt Not Asked   ??? Self-Exams Not Asked   Social History Narrative   ??? Not on file     Social Determinants of Health     Financial Resource Strain:    ??? Difficulty of Paying Living Expenses: Not on file   Food Insecurity:    ??? Worried About Running Out of Food in the Last Year: Not on file   ???  Ran Out of Food in the Last Year: Not on file   Transportation Needs:    ??? Lack of Transportation (Medical): Not on file   ??? Lack of Transportation (Non-Medical): Not on file   Physical Activity:    ??? Days of Exercise per Week: Not on file   ??? Minutes of Exercise per Session: Not on file   Stress:    ??? Feeling of Stress : Not on file   Social Connections:    ??? Frequency of Communication with Friends and Family: Not on file   ??? Frequency of Social Gatherings with Friends and Family: Not on file   ??? Attends Religious Services:  Not on file   ??? Active Member of Clubs or Organizations: Not on file   ??? Attends Archivist Meetings: Not on file   ??? Marital Status: Not on file   Intimate Partner Violence:    ??? Fear of Current or Ex-Partner: Not on file   ??? Emotionally Abused: Not on file   ??? Physically Abused: Not on file   ??? Sexually Abused: Not on file   Housing Stability:    ??? Unable to Pay for Housing in the Last Year: Not on file   ??? Number of Places Lived in the Last Year: Not on file   ??? Unstable Housing in the Last Year: Not on file       FHX:   Family History   Problem Relation Age of Onset   ??? Heart Disease Mother    ??? Hypertension Mother    ??? Diabetes Maternal Aunt    ??? Cancer Maternal Grandmother         colon       Allergy:   Allergies   Allergen Reactions   ??? Latex Hives   ??? Pollen Extracts Cough, Sneezing and Other (comments)     Irritates asthma   ??? Tomato Hives       Home Medications:     Medications Prior to Admission   Medication Sig   ??? DULoxetine (CYMBALTA) 60 mg capsule Take 1 Capsule by mouth daily.   ??? amLODIPine (NORVASC) 5 mg tablet Take 5 mg by mouth daily.   ??? multivitamin (ONE A DAY) tablet Take 1 Tab by mouth daily.   ??? albuterol (PROAIR HFA) 90 mcg/actuation inhaler Take 2 Puffs by inhalation every six (6) hours as needed for Wheezing. Indications: ACUTE ASTHMA ATTACK   ??? cetirizine (ZYRTEC) 10 mg tablet Take 1 Tab by mouth daily. Indications: PERENNIAL ALLERGIC RHINITIS   ???  fluticasone (FLONASE) 50 mcg/actuation nasal spray 2 Sprays by Both Nostrils route daily. Indications: ALLERGIC RHINITIS   ??? DULoxetine (CYMBALTA) 30 mg capsule 1 po daily with food (Patient not taking: Reported on 01/23/2020)       Review of Systems:     Constitutional: No fevers, chills, weight loss, fatigue.   Skin: No rashes, pruritis, jaundice, ulcerations, erythema.   HENT: No headaches, nosebleeds, sinus pressure, rhinorrhea, sore throat.   Eyes: No visual changes, blurred vision, eye pain, photophobia, jaundice.   Cardiovascular: No chest pain, heart palpitations.   Respiratory: No cough, SOB, wheezing, chest discomfort, orthopnea.   Gastrointestinal:    Genitourinary: No dysuria, bleeding, discharge, pyuria.   Musculoskeletal: No weakness, arthralgias, wasting.   Endo: No sweats.   Heme: No bruising, easy bleeding.   Allergies: As noted.   Neurological: Cranial nerves intact.  Alert and oriented. Gait not assessed.   Psychiatric:  No anxiety, depression, hallucinations.                 Visit Vitals  BP (!) 147/84 (BP 1 Location: Left upper arm, BP Patient Position: At rest)   Pulse 92   Temp 98.2 ??F (36.8 ??C)   Resp 18   Ht _0  (1.626 m)   Wt 124.7 kg (275 lb)   SpO2 98%   Breastfeeding Yes   BMI 47.20 kg/m??       Physical Assessment:     constitutional: appearance: well developed, well nourished, normal habitus, no deformities, in no acute distress.   skin: inspection: no rashes, ulcers, icterus or other lesions; no clubbing  or telangiectasias. palpation: no induration or subcutaneos nodules.   eyes: inspection: normal conjunctivae and lids; no jaundice pupils: symmetrical, normoreactive to light, normal accommodation and size.   ENMT: mouth: normal oral mucosa,lips and gums; good dentition. oropharynx: normal tongue, hard and soft palate; posterior pharynx without erythema, exudate or lesions.   neck: no masses organomegaly or tenderness.   respiratory: effort: normal chest excursion; no intercostal  retraction or accessory muscle use.   cardiovascular: abdominal aorta: normal size and position; no bruits. palpation: PMI of normal size and position; normal rhythm; no thrill or murmurs.   abdominal: abdomen: normal consistency; no tenderness or masses. hernias: no hernias appreciated. liver: normal size and consistency. spleen: not palpable.   rectal: hemoccult/guaiac: not performed.   musculoskeletal: no deformities or muscle wasting   lymphatic: axilae: not palpable. groin: not palpable. neck: within normal limits. other: not palpable.   neurologic: cranial nerves: II-XII normal.   psychiatric: judgement/insight: within normal limits. memory: within normal limits for recent and remote events. mood and affect: no evidence of depression, anxiety or agitation. orientation: oriented to time, space and person.        Basic Metabolic Profile   No results for input(s): NA, K, CL, CO2, BUN, GLU, CA, MG, PHOS in the last 72 hours.    No lab exists for component: CREAT      CBC w/Diff    No results for input(s): WBC, RBC, HGB, HCT, MCV, MCH, MCHC, RDW, PLT, HGBEXT, HCTEXT, PLTEXT in the last 72 hours.    No lab exists for component: MPV No results for input(s): GRANS, LYMPH, EOS, PRO, MYELO, METAS, BLAST in the last 72 hours.    No lab exists for component: MONO, BASO     Hepatic Function   No results for input(s): ALB, TP, TBILI, AP, AML, LPSE in the last 72 hours.    No lab exists for component: DBILI, GPT, SGOT       Jonell Cluck, MD, M.D.   [facility] [place] [place]  [email or web address]

## 2020-01-25 NOTE — Anesthesia Post-Procedure Evaluation (Signed)
Procedure(s):  COLONOSCOPY.    MAC    Anesthesia Post Evaluation      Multimodal analgesia: multimodal analgesia used between 6 hours prior to anesthesia start to PACU discharge  Patient location during evaluation: PACU  Patient participation: complete - patient participated  Level of consciousness: awake and alert  Pain management: adequate  Airway patency: patent  Anesthetic complications: no  Cardiovascular status: acceptable  Respiratory status: acceptable  Hydration status: acceptable  Post anesthesia nausea and vomiting:  controlled  Final Post Anesthesia Temperature Assessment:  Normothermia (36.0-37.5 degrees C)      INITIAL Post-op Vital signs:   Vitals Value Taken Time   BP 150/97 01/25/20 0917   Temp     Pulse 77 01/25/20 0919   Resp 20 01/25/20 0919   SpO2 100 % 01/25/20 0919   Vitals shown include unvalidated device data.

## 2020-01-25 NOTE — Anesthesia Pre-Procedure Evaluation (Signed)
Relevant Problems   RESPIRATORY SYSTEM   (+) Mild persistent asthma in adult without complication      NEUROLOGY   (+) Episodic tension-type headache, not intractable       Anesthetic History   No history of anesthetic complications            Review of Systems / Medical History  Patient summary reviewed and pertinent labs reviewed    Pulmonary            Asthma : well controlled       Neuro/Psych   Within defined limits           Cardiovascular    Hypertension: well controlled                   GI/Hepatic/Renal  Within defined limits              Endo/Other        Morbid obesity     Other Findings              Physical Exam    Airway  Mallampati: II  TM Distance: 4 - 6 cm  Neck ROM: normal range of motion   Mouth opening: Normal     Cardiovascular  Regular rate and rhythm,  S1 and S2 normal,  no murmur, click, rub, or gallop             Dental  No notable dental hx       Pulmonary  Breath sounds clear to auscultation               Abdominal  GI exam deferred       Other Findings            Anesthetic Plan    ASA: 3  Anesthesia type: MAC          Induction: Intravenous  Anesthetic plan and risks discussed with: Patient

## 2020-02-08 ENCOUNTER — Encounter

## 2020-02-09 ENCOUNTER — Inpatient Hospital Stay: Admit: 2020-02-09 | Payer: PRIVATE HEALTH INSURANCE | Primary: Family Medicine

## 2020-02-09 ENCOUNTER — Inpatient Hospital Stay: Admit: 2020-02-09 | Discharge: 2020-02-09 | Payer: PRIVATE HEALTH INSURANCE | Primary: Family Medicine

## 2020-02-09 ENCOUNTER — Encounter

## 2020-02-10 LAB — EKG 12-LEAD
Atrial Rate: 85 {beats}/min
Diagnosis: NORMAL
P Axis: 36 degrees
P-R Interval: 166 ms
Q-T Interval: 400 ms
QRS Duration: 78 ms
QTc Calculation (Bazett): 476 ms
R Axis: 55 degrees
T Axis: 54 degrees
Ventricular Rate: 85 {beats}/min

## 2020-02-10 LAB — EKG, 12 LEAD, INITIAL
Atrial Rate: 85 {beats}/min
Calculated P Axis: 36 degrees
Calculated R Axis: 55 degrees
Calculated T Axis: 54 degrees
Diagnosis: NORMAL
P-R Interval: 166 ms
Q-T Interval: 400 ms
QRS Duration: 78 ms
QTC Calculation (Bezet): 476 ms
Ventricular Rate: 85 {beats}/min

## 2020-02-14 ENCOUNTER — Inpatient Hospital Stay: Admit: 2020-02-14 | Payer: PRIVATE HEALTH INSURANCE | Attending: Surgery | Primary: Family Medicine

## 2020-03-28 ENCOUNTER — Inpatient Hospital Stay
Admit: 2020-03-28 | Discharge: 2020-03-28 | Disposition: A | Discharge status: Home / Self Care | Payer: PRIVATE HEALTH INSURANCE | Attending: Emergency Medicine

## 2020-03-28 ENCOUNTER — Emergency Department: Admit: 2020-03-28 | Payer: PRIVATE HEALTH INSURANCE | Primary: Family Medicine

## 2020-03-28 DIAGNOSIS — R1084 Generalized abdominal pain: Secondary | ICD-10-CM

## 2020-03-28 LAB — CBC WITH AUTO DIFFERENTIAL
Basophils %: 0 % (ref 0–2)
Basophils Absolute: 0 10*3/uL (ref 0.0–0.1)
Eosinophils %: 4 % (ref 0–5)
Eosinophils Absolute: 0.3 10*3/uL (ref 0.0–0.4)
Granulocyte Absolute Count: 0.1 10*3/uL — ABNORMAL HIGH (ref 0.00–0.04)
Hematocrit: 43.7 % (ref 35.0–45.0)
Hemoglobin: 13.4 g/dL (ref 12.0–16.0)
Immature Granulocytes: 1 % — ABNORMAL HIGH (ref 0.0–0.5)
Lymphocytes %: 46 % (ref 21–52)
Lymphocytes Absolute: 3.9 10*3/uL — ABNORMAL HIGH (ref 0.9–3.6)
MCH: 24.3 PG (ref 24.0–34.0)
MCHC: 30.7 g/dL — ABNORMAL LOW (ref 31.0–37.0)
MCV: 79.2 FL (ref 78.0–100.0)
MPV: 8.8 FL — ABNORMAL LOW (ref 9.2–11.8)
Monocytes %: 6 % (ref 3–10)
Monocytes Absolute: 0.5 10*3/uL (ref 0.05–1.2)
NRBC Absolute: 0 10*3/uL (ref 0.00–0.01)
Neutrophils %: 43 % (ref 40–73)
Neutrophils Absolute: 3.7 10*3/uL (ref 1.8–8.0)
Nucleated RBCs: 0 PER 100 WBC
Platelets: 313 10*3/uL (ref 135–420)
RBC: 5.52 M/uL — ABNORMAL HIGH (ref 4.20–5.30)
RDW: 14.3 % (ref 11.6–14.5)
WBC: 8.5 10*3/uL (ref 4.6–13.2)

## 2020-03-28 LAB — URINALYSIS W/ RFLX MICROSCOPIC
Bilirubin, Urine: NEGATIVE
Bilirubin: NEGATIVE
Blood, Urine: NEGATIVE
Blood: NEGATIVE
Glucose, Ur: NEGATIVE mg/dL
Glucose: NEGATIVE mg/dL
Ketone: NEGATIVE mg/dL
Ketones, Urine: NEGATIVE mg/dL
Leukocyte Esterase, Urine: NEGATIVE
Leukocyte Esterase: NEGATIVE
Nitrite, Urine: NEGATIVE
Nitrites: NEGATIVE
Protein, UA: NEGATIVE mg/dL
Protein: NEGATIVE mg/dL
Specific Gravity, UA: 1.025 (ref 1.003–1.040)
Specific gravity: 1.025 (ref 1.003–1.040)
Urobilinogen, UA, POCT: 0.2 EU/dL
Urobilinogen: 0.2 EU/dL
pH (UA): 6
pH, UA: 6

## 2020-03-28 LAB — COMPREHENSIVE METABOLIC PANEL
ALT: 49 U/L (ref 13–56)
AST: 31 U/L (ref 10–38)
Albumin/Globulin Ratio: 0.7 — ABNORMAL LOW (ref 0.8–1.7)
Albumin: 3.4 g/dL (ref 3.4–5.0)
Alkaline Phosphatase: 90 U/L (ref 45–117)
Anion Gap: 3 mmol/L (ref 3.0–18)
BUN: 17 MG/DL (ref 7.0–18)
Bun/Cre Ratio: 18 (ref 12–20)
CO2: 32 mmol/L (ref 21–32)
Calcium: 9.2 MG/DL (ref 8.5–10.1)
Chloride: 103 mmol/L (ref 100–111)
Creatinine: 0.92 MG/DL (ref 0.6–1.3)
EGFR IF NonAfrican American: >60 mL/min/{1.73_m2} (ref >60)
GFR African American: >60 mL/min/{1.73_m2} (ref >60)
Globulin: 4.7 g/dL — ABNORMAL HIGH (ref 2.0–4.0)
Glucose: 89 mg/dL (ref 74–99)
Potassium: 4.5 mmol/L (ref 3.5–5.5)
Sodium: 138 mmol/L (ref 136–145)
Total Bilirubin: 0.6 MG/DL (ref 0.2–1.0)
Total Protein: 8.1 g/dL (ref 6.4–8.2)

## 2020-03-28 LAB — LIPASE
Lipase: 109 U/L (ref 73–393)
Lipase: 109 U/L (ref 73–393)

## 2020-03-28 LAB — CBC WITH AUTOMATED DIFF
ABS. BASOPHILS: 0 10*3/uL (ref 0.0–0.1)
ABS. EOSINOPHILS: 0.3 10*3/uL (ref 0.0–0.4)
ABS. IMM. GRANS.: 0.1 10*3/uL — ABNORMAL HIGH (ref 0.00–0.04)
ABS. LYMPHOCYTES: 3.9 10*3/uL — ABNORMAL HIGH (ref 0.9–3.6)
ABS. MONOCYTES: 0.5 10*3/uL (ref 0.05–1.2)
ABS. NEUTROPHILS: 3.7 10*3/uL (ref 1.8–8.0)
ABSOLUTE NRBC: 0 10*3/uL (ref 0.00–0.01)
BASOPHILS: 0 % (ref 0–2)
EOSINOPHILS: 4 % (ref 0–5)
HCT: 43.7 % (ref 35.0–45.0)
HGB: 13.4 g/dL (ref 12.0–16.0)
IMMATURE GRANULOCYTES: 1 % — ABNORMAL HIGH (ref 0.0–0.5)
LYMPHOCYTES: 46 % (ref 21–52)
MCH: 24.3 PG (ref 24.0–34.0)
MCHC: 30.7 g/dL — ABNORMAL LOW (ref 31.0–37.0)
MCV: 79.2 FL (ref 78.0–100.0)
MONOCYTES: 6 % (ref 3–10)
MPV: 8.8 FL — ABNORMAL LOW (ref 9.2–11.8)
NEUTROPHILS: 43 % (ref 40–73)
NRBC: 0 PER 100 WBC
PLATELET: 313 10*3/uL (ref 135–420)
RBC: 5.52 M/uL — ABNORMAL HIGH (ref 4.20–5.30)
RDW: 14.3 % (ref 11.6–14.5)
WBC: 8.5 10*3/uL (ref 4.6–13.2)

## 2020-03-28 LAB — METABOLIC PANEL, COMPREHENSIVE
A-G Ratio: 0.7 — ABNORMAL LOW (ref 0.8–1.7)
ALT (SGPT): 49 U/L (ref 13–56)
AST (SGOT): 31 U/L (ref 10–38)
Albumin: 3.4 g/dL (ref 3.4–5.0)
Alk. phosphatase: 90 U/L (ref 45–117)
Anion gap: 3 mmol/L (ref 3.0–18)
BUN/Creatinine ratio: 18 (ref 12–20)
BUN: 17 MG/DL (ref 7.0–18)
Bilirubin, total: 0.6 MG/DL (ref 0.2–1.0)
CO2: 32 mmol/L (ref 21–32)
Calcium: 9.2 MG/DL (ref 8.5–10.1)
Chloride: 103 mmol/L (ref 100–111)
Creatinine: 0.92 MG/DL (ref 0.6–1.3)
GFR est AA: >60 mL/min/{1.73_m2} (ref >60)
GFR est non-AA: >60 mL/min/{1.73_m2} (ref >60)
Globulin: 4.7 g/dL — ABNORMAL HIGH (ref 2.0–4.0)
Glucose: 89 mg/dL (ref 74–99)
Potassium: 4.5 mmol/L (ref 3.5–5.5)
Protein, total: 8.1 g/dL (ref 6.4–8.2)
Sodium: 138 mmol/L (ref 136–145)

## 2020-03-28 MED ORDER — IOPAMIDOL 61 % IV SOLN
61 % | Freq: Once | INTRAVENOUS | Status: AC
Start: 2020-03-28 — End: 2020-03-28
  Administered 2020-03-28: 18:00:00 via INTRAVENOUS

## 2020-03-28 MED FILL — ISOVUE-300  61 % INTRAVENOUS SOLUTION: 300 mg iodine /mL (61 %) | INTRAVENOUS | Qty: 100

## 2020-03-28 NOTE — ED Notes (Signed)
Pt states she was laughing yesterday and suddenly felt pain in her left upper abdomen radiating into her back.  Pt also states she started having diarrhea this morning and loss of appetite.

## 2020-03-28 NOTE — ED Notes (Signed)
I have reviewed discharge instructions with the patient.  The patient verbalized understanding.

## 2020-03-28 NOTE — ED Provider Notes (Signed)
ED Provider Notes by Skeet Simmer, PA at 03/28/20 1112                Author: Skeet Simmer, PA  Service: EMERGENCY  Author Type: Physician Assistant       Filed: 03/28/20 1346  Date of Service: 03/28/20 1112  Status: Attested           Editor: Skeet Simmer, PA (Physician Assistant)  Cosigner: Jacinto Reap, MD at 03/28/20 1353          Attestation signed by Jacinto Reap, MD at 03/28/20 1353          I was in the department and available for consult.       Magda Kiel, MD                                    [place]   [facility] EMERGENCY DEPT      Date: 03/28/2020   Patient Name: [name] [name]        History of Presenting Illness          Chief Complaint       Patient presents with        ?  Abdominal Pain     ?  Diarrhea        ?  Flank Pain        55 y.o. female presents the ED  complaining of left abdomen/flank pain onset last evening.  Patient states she was laughing when she had a sudden sharp popping pain in her left upper quadrant and left flank.  She states when it first occurred it was a 15 out of 10 pain and she was screaming  in pain, her pain has since subsided down to a 4 out of 10.  She also notes having diarrhea.  Patient tested positive for COVID-19 on 12/30.  She denies any fever, chills, nausea or vomiting, shortness of breath, hemoptysis, other symptoms.      Patient denies any other associated signs or symptoms.  Patient denies any other complaints.      Nursing notes regarding the HPI and triage nursing notes were reviewed.       Prior medical records were reviewed.        Current Outpatient Medications          Medication  Sig  Dispense  Refill           ?  DULoxetine (CYMBALTA) 60 mg capsule  Take 1 Capsule by mouth daily.  30 Capsule  0     ?  DULoxetine (CYMBALTA) 30 mg capsule  1 po daily with food (Patient not taking: Reported on 01/23/2020)  90 Capsule  1     ?  amLODIPine (NORVASC) 5 mg tablet  Take 5 mg by mouth daily.         ?  multivitamin (ONE A  DAY) tablet  Take 1 Tab by mouth daily.               ?  albuterol (PROAIR HFA) 90 mcg/actuation inhaler  Take 2 Puffs by inhalation every six (6) hours as needed for Wheezing. Indications: ACUTE ASTHMA ATTACK  1 Inhaler  5           ?  cetirizine (ZYRTEC) 10 mg tablet  Take 1 Tab by mouth daily. Indications: PERENNIAL ALLERGIC RHINITIS  30 Tab  5           ?  fluticasone (FLONASE) 50 mcg/actuation nasal spray  2 Sprays by Both Nostrils route daily. Indications: ALLERGIC RHINITIS  1 Bottle  5             Past History        Past Medical History:     Past Medical History:        Diagnosis  Date         ?  Asthma       ?  COVID-19  11/2018     ?  Headache       ?  Hx of cholecystectomy  1999     ?  Hypertension           ?  Morbid obesity ([place])             Past Surgical History:     Past Surgical History:         Procedure  Laterality  Date          ?  COLONOSCOPY  N/A  01/25/2020          COLONOSCOPY performed by Jonell Cluck, MD at [facility] ENDOSCOPY          ?  HX APPENDECTOMY    2006     ?  [place]     ?  [place]     ?  HX HYSTERECTOMY              ?  HX HYSTERECTOMY    2012           Family History:     Family History         Problem  Relation  Age of Onset          ?  Heart Disease  Mother       ?  Hypertension  Mother       ?  Diabetes  Maternal Aunt       ?  Cancer  Maternal Grandmother                colon           Social History:     Social History          Tobacco Use         ?  Smoking status:  Never Smoker     ?  Smokeless tobacco:  Never Used       Substance Use Topics         ?  Alcohol use:  Yes              Alcohol/week:  1.0 standard drink         Types:  1 Glasses of wine per week             Comment: once per month         ?  Drug use:  No           Allergies:     Allergies        Allergen  Reactions         ?  Latex  Hives     ?  Pollen Extracts  Cough, Sneezing and Other (comments)             Irritates asthma         ?  Tomato  Hives            Patient's primary care provider (as noted in EPIC):  Afsharchi, Velda Shell, MD      Review of Systems    Constitutional:  Denies malaise, fever, chills.    Head:  Denies injury.    Face:  Denies injury or pain.    ENMT:  Denies sore throat.    Neck:  Denies injury or pain.    Chest:  Denies injury.    Cardiac:  Denies chest pain or palpitations.    Respiratory:  Denies cough, wheezing, difficulty breathing, shortness of breath.    GI/ABD:  Denies injury, pain, distention, nausea, vomiting, diarrhea.    GU:  Denies injury, pain, dysuria or urgency.    Back:  Denies injury or pain.    Pelvis:  Denies injury or pain.    Extremity/MS:  Denies injury or pain.    Neuro:  Denies headache, LOC, dizziness, neurologic symptoms/deficits/paresthesias.    Skin: Denies injury, rash, itching or skin changes.   All other systems negative as reviewed.       Visit Vitals      BP  (!) 130/100     Pulse  99     Temp  98.7 ??F (37.1 ??C)     Resp  18     Ht  5' 4"  (1.626 m)     Wt  126.6 kg (279 lb)     SpO2  96%        BMI  47.89 kg/m??           Patient Vitals for the past 12 hrs:            Temp  Pulse  Resp  BP  SpO2            03/28/20 1104  98.7 ??F (37.1 ??C)  99  18  (!) 130/100  96 %           PHYSICAL EXAM:      CONSTITUTIONAL:  Alert, in no apparent distress;  well developed;  well nourished.   HEAD:  Normocephalic, atraumatic.   EYES:  EOMI.  Non-icteric sclera.  Normal conjunctiva.   ENTM:  Nose:  no rhinorrhea.  Throat:  no erythema or exudate, mucous membranes moist.   NECK:  Supple   RESPIRATORY:  Chest clear, equal breath sounds, good air movement.  Without wheezes, rhonchi or rales.    CARDIOVASCULAR:  Regular rate and rhythm.  No murmurs, rubs, or gallops.   GI:  Normal bowel sounds, abdomen soft with LLQ and LUQ pain.  No rebound or guarding.    BACK:  Non-tender.   NEURO:  Moves all four extremities, and grossly normal motor exam.   SKIN:  No rashes;  Normal for age.   PSYCH:  Alert and normal affect.      DIFFERENTIAL  DIAGNOSES/ MEDICAL DECISION MAKING:   Gastritis, gerd, peptic ulcer disease, cholecystitis, pancreatitis, gastroenteritis, hepatitis, constipation related pain, appendicitis pain, diverticulitis, urinary tract infection, obstruction, abdominal wall pain, or combination of the above versus  many other processes.        Recent Results (from the past 12 hour(s))     URINALYSIS W/ RFLX MICROSCOPIC          Collection Time: 03/28/20 11:08 AM         Result  Value  Ref Range            Color  YELLOW  Appearance  CLEAR          Specific gravity  1.025  1.003 - 1.040         pH (UA)  6.0          Protein  Negative  mg/dL       Glucose  Negative  mg/dL       Ketone  Negative  mg/dL       Bilirubin  Negative          Blood  Negative          Urobilinogen  0.2  EU/dL       Nitrites  Negative          Leukocyte Esterase  Negative          CBC WITH AUTOMATED DIFF          Collection Time: 03/28/20 11:54 AM         Result  Value  Ref Range            WBC  8.5  4.6 - 13.2 K/uL       RBC  5.52 (H)  4.20 - 5.30 M/uL       HGB  13.4  12.0 - 16.0 g/dL       HCT  43.7  35.0 - 45.0 %       MCV  79.2  78.0 - 100.0 FL       MCH  24.3  24.0 - 34.0 PG       MCHC  30.7 (L)  31.0 - 37.0 g/dL       RDW  14.3  11.6 - 14.5 %       PLATELET  313  135 - 420 K/uL       MPV  8.8 (L)  9.2 - 11.8 FL       NRBC  0.0  0 PER 100 WBC       ABSOLUTE NRBC  0.00  0.00 - 0.01 K/uL       NEUTROPHILS  43  40 - 73 %       LYMPHOCYTES  46  21 - 52 %       MONOCYTES  6  3 - 10 %       EOSINOPHILS  4  0 - 5 %       BASOPHILS  0  0 - 2 %       IMMATURE GRANULOCYTES  1 (H)  0.0 - 0.5 %       ABS. NEUTROPHILS  3.7  1.8 - 8.0 K/UL       ABS. LYMPHOCYTES  3.9 (H)  0.9 - 3.6 K/UL       ABS. MONOCYTES  0.5  0.05 - 1.2 K/UL       ABS. EOSINOPHILS  0.3  0.0 - 0.4 K/UL       ABS. BASOPHILS  0.0  0.0 - 0.1 K/UL       ABS. IMM. GRANS.  0.1 (H)  0.00 - 0.04 K/UL       DF  AUTOMATED          METABOLIC PANEL, COMPREHENSIVE          Collection Time: 03/28/20 11:54 AM          Result  Value  Ref Range            Sodium  138  136 - 145 mmol/L       Potassium  4.5  3.5 - 5.5 mmol/L       Chloride  103  100 - 111 mmol/L       CO2  32  21 - 32 mmol/L       Anion gap  3  3.0 - 18 mmol/L       Glucose  89  74 - 99 mg/dL       BUN  17  7.0 - 18 MG/DL       Creatinine  0.92  0.6 - 1.3 MG/DL       BUN/Creatinine ratio  18  12 - 20         GFR est AA  >60  >60 ml/min/1.66m       GFR est non-AA  >60  >60 ml/min/1.740m      Calcium  9.2  8.5 - 10.1 MG/DL       Bilirubin, total  0.6  0.2 - 1.0 MG/DL       ALT (SGPT)  49  13 - 56 U/L       AST (SGOT)  31  10 - 38 U/L       Alk. phosphatase  90  45 - 117 U/L       Protein, total  8.1  6.4 - 8.2 g/dL       Albumin  3.4  3.4 - 5.0 g/dL       Globulin  4.7 (H)  2.0 - 4.0 g/dL       A-G Ratio  0.7 (L)  0.8 - 1.7         LIPASE          Collection Time: 03/28/20 11:54 AM         Result  Value  Ref Range            Lipase  109  73 - 393 U/L         CT ABD PELV W CONT      Result Date: 03/28/2020   CT ABDOMEN AND PELVIS WITH CONTRAST COMPARISON: None. INDICATIONS: Left upper abdominal pain, diarrhea, loss of appetite. TECHNIQUE:  Following the uneventful administration of 100cc of Isovue 300 intravenous contrast , volumetric data acquisition was  performed of the abdomen and pelvis on a multislice scanner and reconstructed in axial coronal and sagittal planes CT ABDOMEN FINDINGS: Lung Bases: Clear. Liver/Gallbladder/Biliary: Moderate steatosis. No focal lesions. Gallbladder out. No biliary dilation.  Spleen: Normal. Adrenal Glands: Normal. Kidneys: Normal. Pancreas: Normal. Stomach, Small Bowel,  and Colon: Normal. The appendix, if present, is not identified. Lymph Nodes: Normal in size and number. The abdominal aorta is unremarkable. The IVC is unremarkable.  Peritoneal Spaces: No free fluid or free air is present. Abdominal wall: No hernia or mass is evident Bladder: Unremarkable. Uterus and adnexal structures appear unremarkable Osseous Structures Of  Abdomen And Pelvis: Unremarkable for age.       No acute abnormalities are identified in the abdomen or pelvis All CT scans at this facility are performed using dose optimization technique as appropriate to the performed exam, to include automated exposure control, adjustment of the mA and/or kV according  to patient's size (Including appropriate matching for site-specific examinations), or use of iterative reconstruction technique.          ED COURSE AND MEDICAL DECISION MAKING:     The patient's pain improved in the ED with the noted medications.      On reassessment of the patient, the patient continues to have  no surgical abdomen with no rebound nor guarding. The patient does not appear  septic by presentation, vital signs and laboratory results.       The patient continues to appear non-toxic in the emergency department on reevaluations      [place]:   Based upon the patient's presentation with noted HPI and PE, along with the work up done in the emergency department, I believe that the patient is having abdominal pain of uncertain etiology, possibly muscle strain.  Patient may take Tylenol, Motrin,  rest, follow-up with primary care doctor.  She does not have any shortness of breath, hemoptysis, pain throughout her lung region.      However, I do believe that the patient is stable and can be discharged home with further outpatient evaluation of the abdominal pain by the patient's primary doctor.        Diagnosis:       1.  Abdominal pain, generalized         Disposition: Discharge        Follow-up Information               Follow up With  Specialties  Details  Why  Contact Info              Theador Hawthorne, MD  Family Medicine  In 3 days    [address] [place]   [place] [address]   [phone]                 [facility]  Emergency Medicine    If symptoms worsen  [facility]   [phone]                  Patient's  Medications       Start Taking          No medications on file       Continue Taking           ALBUTEROL (PROAIR HFA) 90 MCG/ACTUATION INHALER     Take 2 Puffs by inhalation every six (6) hours as needed for Wheezing. Indications: ACUTE ASTHMA ATTACK           AMLODIPINE (NORVASC) 5 MG TABLET     Take 5 mg by mouth daily.           CETIRIZINE (ZYRTEC) 10 MG TABLET     Take 1 Tab by mouth daily. Indications: PERENNIAL ALLERGIC RHINITIS           DULOXETINE (CYMBALTA) 30 MG CAPSULE     1 po daily with food           DULOXETINE (CYMBALTA) 60 MG CAPSULE     Take 1 Capsule by mouth daily.           FLUTICASONE (FLONASE) 50 MCG/ACTUATION NASAL SPRAY     2 Sprays by Both Nostrils route daily. Indications: ALLERGIC RHINITIS           MULTIVITAMIN (ONE A DAY) TABLET     Take 1 Tab by mouth daily.       These Medications have changed          No medications on file       Stop Taking          No medications on file        Skeet Simmer, PA

## 2020-04-01 ENCOUNTER — Inpatient Hospital Stay: Admit: 2020-04-01 | Payer: PRIVATE HEALTH INSURANCE | Primary: Family Medicine

## 2020-04-01 DIAGNOSIS — Z20822 Contact with and (suspected) exposure to covid-19: Secondary | ICD-10-CM

## 2020-04-01 LAB — SARS-COV-2 BY NAA: SARS-CoV-2, NAA: NOT DETECTED

## 2020-04-15 ENCOUNTER — Ambulatory Visit
Admit: 2020-04-15 | Discharge: 2020-04-15 | Payer: PRIVATE HEALTH INSURANCE | Attending: Physical Medicine & Rehabilitation | Primary: Family Medicine

## 2020-04-15 ENCOUNTER — Ambulatory Visit: Attending: Physical Medicine & Rehabilitation | Primary: Internal Medicine

## 2020-04-15 DIAGNOSIS — M48062 Spinal stenosis, lumbar region with neurogenic claudication: Secondary | ICD-10-CM

## 2020-04-15 MED ORDER — PREGABALIN 75 MG CAP
75 mg | ORAL_CAPSULE | Freq: Two times a day (BID) | ORAL | 1 refills | Status: DC
Start: 2020-04-15 — End: 2020-06-06

## 2020-04-15 NOTE — Progress Notes (Signed)
 [name] [name] presents today for   Chief Complaint   Patient presents with   . Back Pain       Is someone accompanying this pt? no    Is the patient using any DME equipment during OV? no    Depression Screening:  3 most recent PHQ Screens 10/16/2019   Little interest or pleasure in doing things Not at all   Feeling down, depressed, irritable, or hopeless Not at all   Total Score PHQ 2 0       Learning Assessment:  Learning Assessment 12/06/2013   PRIMARY LEARNER Patient   HIGHEST LEVEL OF EDUCATION - PRIMARY LEARNER  SOME COLLEGE   BARRIERS PRIMARY LEARNER NONE   CO-LEARNER CAREGIVER No   PRIMARY LANGUAGE ENGLISH   LEARNER PREFERENCE PRIMARY READING   ANSWERED BY Kyia Maranan   RELATIONSHIP SELF       Abuse Screening:  No flowsheet data found.    Fall Risk  No flowsheet data found.    OPIOID RISK TOOL  No flowsheet data found.    Coordination of Care:  1. Have you been to the ER, urgent care clinic since your last visit? Yes first week of January    Hospitalized since your last visit? no    2. Have you seen or consulted any other health care providers outside of the [facility] since your last visit? no Include any pap smears or colon screening. no

## 2020-04-15 NOTE — Progress Notes (Signed)
Progress  Notes by [name], MD at 04/15/20 0830                Author: [name], MD  Service: --  Author Type: Physician       Filed: 04/15/20 1148  Encounter Date: 04/15/2020  Status: Signed          Editor: [name], MD (Physician)                        [facility]  [address]   [place], [place] [address]   Phone: [phone]   Fax: [phone]            [name] [name], [name] [name]   DOB: [birth year]   PCP: [name] Backer, MD      PROGRESS NOTE         ASSESSMENT AND PLAN     Diagnoses and all orders for this visit:      1. Lumbar stenosis with neurogenic claudication   -     AMB POC XRAY, SPINE, LUMBOSACRAL; 4+   -     pregabalin (Lyrica) 75 mg capsule; Take 1 Capsule by mouth two (2) times a day. Max Daily Amount: 150 mg. Month #1: One po qhs x 1week, then one po bid thereafter. Subsequent month one po bid   -     SCHEDULE SURGERY            1.  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  She has failed physical therapy and increasing doses of Cymbalta..    2.  Risks, benefits, alternatives, and limitations of ESI discussed with patient.   3.  Schedule ESI L4-5   4.  DC Cymbalta   5.  Trial of Lyrica 75 mg BID. Patient will take 1 po QHS x 1 week then increase to BID        Follow-up and Dispositions      ??  Return for after Injections.                  HISTORY OF PRESENT ILLNESS        [name] [name] is a 55 y.o.  female presents for follow up of low back. Patient was admitted into the ED 03/2020 for abdominal pain.      She reports constant pain that radiates into her hips and lateral thighs. L > R. Her pain is exacerbated with household activities and standing in one position.      She states that Cymbalta was no longer effective. She is compliant with her HEP as tolerated      Denies red flags including persistent fevers, chills, weight changes, neurogenic bowel or bladder  symptoms.            Pain Assessment   04/15/2020        Location of Pain  -     Severity of Pain  7     Quality of Pain  Throbbing     Quality of Pain Comment  -     Duration of Pain  Persistent     Frequency of Pain  Constant     Aggravating Factors  Other (Comment)     Aggravating Factors Comment  doing house cleaning     Limiting Behavior  Yes     Relieving Factors  Nothing     Relieving Factors Comment  -  Result of Injury  No              Pertinent past spine history:   L XR AP/lat/flex/ext 4V 04/15/2020 (I personally reviewed these images): degenerative changes L3 to sacrum, no instability   L MRI 02/2019: Moderate stenosis L3-L4-L5   PT 2021 no significant benefit   Beneficial Medications: none   Failed medications: Lodine, Cymbalta 30 mg   ??   ??   Pertinent past medical history:   Asthma, Covid x 2 (02/2020, 11/2018)   ??   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           TTP midline L5-S1, left upper glute   Mild difficulty with tandem gait   LE strength intact   SLR negative                        Written by , ScribeKick, as dictated by Emmit Alexanders, MD.

## 2020-05-07 ENCOUNTER — Ambulatory Visit: Admit: 2020-05-07 | Payer: PRIVATE HEALTH INSURANCE | Primary: Family Medicine

## 2020-05-07 ENCOUNTER — Inpatient Hospital Stay: Payer: PRIVATE HEALTH INSURANCE

## 2020-05-07 MED ORDER — LIDOCAINE (PF) 10 MG/ML (1 %) IJ SOLN
10 mg/mL (1 %) | INTRAMUSCULAR | Status: AC
Start: 2020-05-07 — End: ?

## 2020-05-07 MED ORDER — IOPAMIDOL 41 % INTRATHECAL
200 mg iodine /mL (41 %) | INTRATHECAL | Status: DC | PRN
Start: 2020-05-07 — End: 2020-05-07
  Administered 2020-05-07: 15:00:00

## 2020-05-07 MED ORDER — DEXAMETHASONE SODIUM PHOSPHATE 10 MG/ML IJ SOLN
10 mg/mL | INTRAMUSCULAR | Status: DC | PRN
Start: 2020-05-07 — End: 2020-05-07
  Administered 2020-05-07: 15:00:00

## 2020-05-07 MED ORDER — DIAZEPAM 5 MG TAB
5 mg | Freq: Once | ORAL | Status: AC
Start: 2020-05-07 — End: 2020-05-07
  Administered 2020-05-07: 14:00:00 via ORAL

## 2020-05-07 MED ORDER — DEXAMETHASONE SODIUM PHOSPHATE (PF) 10 MG/ML INJECTION
10 mg/mL | INTRAMUSCULAR | Status: AC
Start: 2020-05-07 — End: ?

## 2020-05-07 MED ORDER — LIDOCAINE (PF) 10 MG/ML (1 %) IJ SOLN
10 mg/mL (1 %) | INTRAMUSCULAR | Status: DC | PRN
Start: 2020-05-07 — End: 2020-05-07
  Administered 2020-05-07: 15:00:00

## 2020-05-07 MED ORDER — IOPAMIDOL 41 % INTRATHECAL
200 mg iodine /mL (41 %) | INTRATHECAL | Status: AC
Start: 2020-05-07 — End: ?

## 2020-05-07 MED FILL — XYLOCAINE-MPF 10 MG/ML (1 %) INJECTION SOLUTION: 10 mg/mL (1 %) | INTRAMUSCULAR | Qty: 30

## 2020-05-07 MED FILL — ISOVUE-M 200  41 % INTRATHECAL SOLUTION: 200 mg iodine /mL (41 %) | INTRATHECAL | Qty: 10

## 2020-05-07 MED FILL — DIAZEPAM 5 MG TAB: 5 mg | ORAL | Qty: 2

## 2020-05-07 MED FILL — DEXAMETHASONE SODIUM PHOSPHATE (PF) 10 MG/ML INJECTION: 10 mg/mL | INTRAMUSCULAR | Qty: 1

## 2020-05-07 NOTE — Procedures (Signed)
Intralaminar Epidural Steroid Procedure Note        Patient Name   [name] [name]  Date of Procedure: May 07, 2020  Preoperative Diagnosis: Lumbar spinal stenosis  Postoperative Diagnosis: Same  Location [facility], [facility] [place], [place]      Procedure:  Epidural Steroid Injection    Consent:  Informed consent was obtained prior to the procedure.  In addition to the potential risks associated with the procedure itself, the patient was informed both verbally and in writing of the potential side effects of the use of glucocorticoid.  The patient appeared to comprehend the informed consent and desired to have the procedure performed.      Procedure in Detail:  The patient was taken to the procedure suite and placed in the prone position on the operating table on appropriate padding. The posterior lumbar region was prepped and draped in the usual sterile fashion. Intraoperative fluoroscopy was used to localize the L4-L5 interspace. The skin was infiltrated with 1% lidocaine. An 18-gauge standard #6 spinal Tuohy needle was advanced into the epidural space at L4-L5 under fluoroscopic guidance using the loss of resistance technique. No cerebrospinal fluid was seen throughout the procedure.     Yes  A small amount of Isovue was injected into the epidural space, confirming appropriated needle placement on fluoroscopy. No vascular uptake was identified.    Next, 50ml of 1% Lidocaine and 10mg  of preservative free Dexamethasone were injected via the Tuohy needle. The needle was removed from the patient.     The patient tolerated the procedure well and was discharged home with designated driver and care instructions.Patient reported peri-procedural pain on Visual Analog Scale:  pre-5; post-0.      Signed By: , MD                      May 07, 2020

## 2020-05-07 NOTE — Interval H&P Note (Signed)
Wheeled to preop for observation. Stable at this time.

## 2020-05-07 NOTE — Interval H&P Note (Signed)
Patient verbally consented to HIPAA, and verbalized understanding of discharge instructions.

## 2020-05-07 NOTE — Interval H&P Note (Signed)
 Patient tolerated procedure well.  No complications noted. VSS. No redness, swelling, or bleeding from injection site.  Dressing dry and intact.  Armband removed and shredded.  Patient wheeled  to main entrance by RN and discharged alive and well, in stable condition.

## 2020-05-08 ENCOUNTER — Encounter

## 2020-05-30 ENCOUNTER — Inpatient Hospital Stay: Admit: 2020-05-30 | Payer: PRIVATE HEALTH INSURANCE | Primary: Family Medicine

## 2020-05-30 DIAGNOSIS — Z713 Dietary counseling and surveillance: Secondary | ICD-10-CM

## 2020-05-30 NOTE — Progress Notes (Signed)
 Dear Dr. Alberteen:    [name] [name] is a 55 y.o. female seen today for nutrition counseling and Medical Nutrition Therapy for weight loss and healthy eating.    Weight Loss Trial Visit   2 of 4(6)    Ht:    64 (Reported)   Wt:   290.4# (Recorded)   BMI:  Body mass index is 49.95 kg/m.    Past Medical History includes:  Past Medical History:   Diagnosis Date   . Asthma    . COVID-19 11/2018, 02/2020   . Headache    . Hx of cholecystectomy 1999   . Hypertension    . Morbid obesity (HCC)        Current Medications:  Current Outpatient Medications   Medication Sig Dispense Refill   . benzonatate (TESSALON) 100 mg capsule Take 1-2 cap orally every 8 hours as needed for cough. Swallow pills whole     . fluticasone propion-salmeteroL (ADVAIR/WIXELA) 250-50 mcg/dose diskus inhaler Take 1 Puff by inhalation two (2) times a day.     . pregabalin (Lyrica) 75 mg capsule Take 1 Capsule by mouth two (2) times a day. Max Daily Amount: 150 mg. Month #1: One po qhs x 1week, then one po bid thereafter. Subsequent month one po bid 60 Capsule 1   . amLODIPine (NORVASC) 5 mg tablet Take 5 mg by mouth daily.     . multivitamin (ONE A DAY) tablet Take 1 Tab by mouth daily.     SABRA albuterol (PROAIR HFA) 90 mcg/actuation inhaler Take 2 Puffs by inhalation every six (6) hours as needed for Wheezing. Indications: ACUTE ASTHMA ATTACK 1 Inhaler 5   . cetirizine (ZYRTEC) 10 mg tablet Take 1 Tab by mouth daily. Indications: PERENNIAL ALLERGIC RHINITIS 30 Tab 5   . fluticasone (FLONASE) 50 mcg/actuation nasal spray 2 Sprays by Both Nostrils route daily. Indications: ALLERGIC RHINITIS 1 Bottle 5            05/30/20 1541   Diabetes History   Type of Diabetes Unknown   Family History of Diabetes N/A   Anything That Gets in the Way of Your Learning None of the above   Diet History   Have You Run Out or Worried About Having Enough Food the Last 12 Months No   Do You Follow a Meal Plan for Diabetes No   Do You Skip Meals Yes   Which Meal/s Do You Skip  Lunch  (Depends on work load.)   What Do You Drink on a Daily Basis and How Much Coffee (sweetened);Water ;Soda (not diet)   Amount of Coffee (with Sugar) Drank (oz) 16 (2 Cups)  (Adds [employer] creamer.)   Amount of Soda Drank (oz) 16 (2 Cups)  (Not daily. Occasional regular soda on a stressful day.)   Amount of Water  Drank (oz) 48 (6 Cups)   Do You Read or Use Nutrition Labels as a Dietary Guide No   Sensitivities/[place]eriges   (Reports she sometimes has an aversion to eggs (consistency) then other times she is fine with eating eggs.)   Weight History in the Last Three Months Stable   Food Record Completed Yes   Who Does the Grocery Shopping Self   Who Cooks Your Meals Self;Spouse/significant other   Who Do You Live With With spouse/partner   Foods You Find Difficult to Eat No  (Very rare.)   Physical Activity   Are You Physically Active No  (Walks a lot at work. Does own a  stationary bike.)   Do You Have Difficulty Exercising Yes  (Back issues, seeing a pain specialist.)   Lab/Test Results   Weight 132 kg (291 lb 0.1 oz)     [name] [name] doesn't smoke or consume alcohol.      She has previously attempted to lose weight by participating in a [employer] event at work or simply portion control and exercise, which has not led to long-term weight management.  She desires to change her lifestyle through diet and exercise to promote weight loss.       Food records were reviewed at today's visit and suggestions were made for improvement. Benefits of keeping detailed daily food records for long term weight loss success were discussed.   [name] [name] usually eats 2-3  meals and 1-2 snacks a day.      Breakfast:  Varied from cream of wheat to breakfast sandwiches to pancakes, eggs, bacon, and hash browns  Lunch: Whatever was available to bring to work such as fruit such as mango, kiwi, fruit cup & popcorn or burritos and chips or cupcake or skips  Dinner: Variety of meats, a starch (macaroni and cheese, baked  potato with sour cream/butter/cheese, fries, potato salad etc.), and a vegetable   Snacks: pretzels, Klondike bar, party mix, cupcake, candy    Introduced [name] [name] to a 1200 calorie meal plan, the Flexible Meal Plan for Weight Loss and the Plate Method for Meal Planning. Written materials were provided.  Practiced planning meals using food models to demonstrate use of the Plate Method and to visualize appropriate serving sizes. We also practiced reading food labels.  Reviewed selection criteria for acceptable protein supplements and provided written material/list.    [name] [name] is motivated to make lifestyle changes to improve her overall health and help her to lose weight.  She set several goals and these are attached for reference.      [name] [name] was a pleasure to work with today.  She has my contact information for questions as needed. She is scheduled for a follow-up appointment on July 04, 2020.    Jori LITTIE Hamilton, RD   Registered Dietitian Nutritionist  [facility]  [address], [place], [place], [place] [address]  [phone]    Nutrition Goals:  - Keep a daily food diary - Bring to every appointment  - Do not skip meals - goal of 3 meals/day, limit snacking between meals.  o Eat within 1 -1.5 hours of waking.   o Recommend using the healthy snack list or acceptable protein supplement as an option for a meal instead of skipping a meal.  - Eat every 4-6 hours.  - Use the plate method diagram (picture of plate with servings from each food group) and/or meal plan provided by your Dietitian.  o Incorporate more lean protein - eat protein first at meals.  - Goal of 60-75 grams (g) protein per day.  o  of your plate should be non-starchy vegetables (refer to vegetable list).  o Limit starches for each meal to 1/3-1/2 cup (e.g. corn, peas, potatoes, pasta, rice, beans).  o Reduce and work towards eliminating fried foods.  o Reduce and work towards  eliminating refined carbohydrates (chips, cookies - no snacks or foods outside of label reading guidelines for grams of sugar and fat).  - Fluids:  o Drink 64 oz. of fluid per day; water  is highly recommended (avoid carbonation (bubbles) and sugar sweetened beverages).  o Start cutting back on caffeine - goal is zero caffeine by your last appointment.  o Do not use straws.  - Read food labels:  o Before surgery keep added sugar to 9 g or less for yogurt, 5 g or less for cereals, and 4 g or less for jelly and jam.  o After surgery keep added sugar and total fat to 3 g or less (3 gram rule).  - Eating practices:  o Start sampling protein supplements (supplement should contain less than 3 g of fat and sugar and provide 20 g or more of protein per 8-12 oz. serving).  Refer to acceptable protein supplement list.  o Start practicing the 30-30 rule; do not drink fluids 30 minutes before or after meals.  o Chew, chew, chew - chew each bite of food 25 - 30 times.  o Eat slowly; one meal should take 30 minutes to finish.  - Lifestyle behaviors:  o Tobacco cessation 3 months prior to surgery- NO SMOKING EVER  o Abstain from alcohol for 3 months prior to surgery - no alcohol for one year after surgery.  o Exercise a minimum 150 minutes per week (30 minutes per day, 5 days per week) - ultimate goal is to exercise 7 days per week.      Call my Registered Dietitian Nutritionist, Jori LITTIE Hamilton, RD, at [phone] if I have any questions.

## 2020-06-06 ENCOUNTER — Telehealth
Admit: 2020-06-06 | Discharge: 2020-06-07 | Payer: PRIVATE HEALTH INSURANCE | Attending: Physical Medicine & Rehabilitation | Primary: Family Medicine

## 2020-06-06 ENCOUNTER — Telehealth: Attending: Physical Medicine & Rehabilitation | Primary: Internal Medicine

## 2020-06-06 DIAGNOSIS — M48062 Spinal stenosis, lumbar region with neurogenic claudication: Secondary | ICD-10-CM

## 2020-06-06 MED ORDER — PREGABALIN 150 MG CAP
150 mg | ORAL_CAPSULE | Freq: Two times a day (BID) | ORAL | 2 refills | Status: AC
Start: 2020-06-06 — End: ?

## 2020-06-06 NOTE — Progress Notes (Addendum)
Progress  Notes by [name], MD at 06/06/20 1530                Author: [name], MD  Service: --  Author Type: Physician       Filed: 06/07/20 1439  Encounter Date: 06/06/2020  Status: Signed          Editor: [name], MD (Physician)                     [facility]  [address]   [place], [place] [address]   Phone: [phone]   Fax: [phone]         [name] [name] is a 55 y.o.  female who was seen by synchronous (real-time) audio-video technology on 06/06/2020  through the [employer] platform.         This visit was performed while I was in my office. The patient was in their home.      Diagnoses and all orders for this visit:      1. Lumbar stenosis with neurogenic claudication   -     pregabalin (Lyrica) 150 mg capsule; Take 1 Capsule by mouth two (2) times a day. Max Daily Amount: 300 mg.   -     REFERRAL TO SPINE SURGERY   -     MRI LUMB SPINE WO CONT; Future             1.  54yo w/symptomatic stenosis, failed extensive conservative treatments. Now wants to consider sx.   2.  Dose adjustment. Increase Lyrica to 150mg  BID   3.  Referral to spine surgery for consult   4.  L MRI for worsening stenosis, failed meds/ESI. surgical eval        Follow-up and Dispositions      ??  Return if symptoms worsen or fail to improve.                   HISTORY OF PRESENT ILLNESS  [name] [name] is a 55 y.o.  female. Pt presents to the office for a f/u visit for back pain. LV given trial of Lyrica      Patient received ESI L4-5 04/2020 with few days benefit. Denies side effects      She continues to have pain radiating into her buttocks and lateral thighs. L > R. She has a very short standing tolerance. She states that her walking tolerance is longer, but ambulating with anything in her hands decreases that tolerance.      She states that the Lyrica is ineffective. Denies side effects.      Denies red flags including persistent fevers, chills, weight  changes, neurogenic bowel or bladder symptoms.         PHYSICAL EXAM   NAD, speech fluent         Pain Assessment   06/06/2020        Location of Pain  Back     Severity of Pain  5     Quality of Pain  Aching;Burning     Quality of Pain Comment  -     Duration of Pain  Persistent     Frequency of Pain  Intermittent     Aggravating Factors  (No Data)     Aggravating Factors Comment  depends on what I'm doing     Limiting Behavior  Yes     Relieving Factors  Rest  Relieving Factors Comment  stop doing what is causing the pain        Result of Injury  -           Pertinent past spine history:   L XR AP/lat/flex/ext 4V 04/15/2020 (I personally reviewed these images): degenerative changes L3 to sacrum, no instability   L MRI 02/2019: Moderate stenosis L3-L4-L5   PT 2021 no significant benefit   Beneficial Medications: none   Failed medications: Lodine, Cymbalta 30 mg   ??   ??   Pertinent past medical history:   Asthma, Covid x 2 (02/2020, 11/2018)   ??   Work status:   Research scientist (medical) at [employer]   ??          Updated medical history, surgical history, medications, and allergies have been reviewed by the office staff or myself.            We discussed the expected course, resolution and complications of the diagnosis(es) in detail.  Medication risks, benefits, interactions, and alternatives were discussed as indicated.  I advised  her to contact the office if her condition worsens, changes or fails to improve as anticipated.  She expressed understanding with the diagnosis(es) and plan.          Consent:   She and/or her healthcare decision maker is aware that this patient-initiated Telehealth encounter is a billable service, with coverage as determined by her insurance carrier. She is aware that she may receive a bill and has provided verbal consent to  proceed: Yes      Pursuant to the emergency declaration under the [place] Act and the [employer], 1135 waiver authority and the  [employer] Act, this Virtual  Visit was conducted, with patient's consent,  to reduce the patient's risk of exposure to COVID-19 and provide continuity of care for an established patient.       Services were provided through a video synchronous discussion virtually to substitute for in-person clinic visit.      Dragon [employer] engineer was used in the creation of this note.  Unintended transcription, spelling, and grammar errors may be present.  This document has been electronically signed but not proofread for these specific errors.      [name], MD         Written by Emmit Alexanders, ScribeKick, as dictated by Wynelle Beckmann, MD.

## 2020-06-06 NOTE — Progress Notes (Signed)
Progress  Notes by Gardiner Sleeper, LPN at 47/82/95 1530                Author: Gardiner Sleeper, LPN  Service: --  Author Type: Licensed Nurse       Filed: 06/06/20 1440  Encounter Date: 06/06/2020  Status: Signed          Editor: Gardiner Sleeper, LPN (Licensed Nurse)                  [name] [name] presents today for      Chief Complaint       Patient presents with        ?  Back Pain           Is someone accompanying this pt? no      Is the patient using any DME equipment during OV? no      Depression Screening:      3 most recent PHQ Screens  10/16/2019        Little interest or pleasure in doing things  Not at all     Feeling down, depressed, irritable, or hopeless  Not at all        Total Score PHQ 2  0           Learning Assessment:      Learning Assessment  12/06/2013        PRIMARY LEARNER  Patient     HIGHEST LEVEL OF EDUCATION - PRIMARY LEARNER   SOME COLLEGE     BARRIERS PRIMARY LEARNER  NONE     CO-LEARNER CAREGIVER  No     PRIMARY LANGUAGE  ENGLISH     LEARNER PREFERENCE PRIMARY  READING     ANSWERED BY  [name] [name]        RELATIONSHIP  SELF           Coordination of Care:   1. Have you been to the ER, urgent care clinic since your last visit? no  Hospitalized since your last visit? no      2. Have you seen or consulted any other health care providers outside of the [facility] since your last visit? Yes, pcp Include any pap smears or colon screening.  no

## 2020-06-12 NOTE — Telephone Encounter (Signed)
Patient called stating that she was referred to get another MRI of her lumbar spine and that she would be referred to a spinal surgeon but was not sure who she was supposed to see. I looked in her chart and did not see a referral either. Please advise patient at [phone]

## 2020-06-12 NOTE — Telephone Encounter (Signed)
MRI Lumbar Spine is scheduled on 06/24/20. Once results are received then I will forward the order.

## 2020-06-12 NOTE — Telephone Encounter (Signed)
Order is in system.

## 2020-06-14 ENCOUNTER — Encounter

## 2020-06-24 ENCOUNTER — Inpatient Hospital Stay
Admit: 2020-06-24 | Payer: PRIVATE HEALTH INSURANCE | Attending: Physical Medicine & Rehabilitation | Primary: Internal Medicine

## 2020-06-24 DIAGNOSIS — M48062 Spinal stenosis, lumbar region with neurogenic claudication: Secondary | ICD-10-CM

## 2020-06-27 NOTE — Telephone Encounter (Signed)
Records sent on 06/25/20. See referral for details.

## 2020-07-04 ENCOUNTER — Inpatient Hospital Stay: Admit: 2020-07-04 | Payer: PRIVATE HEALTH INSURANCE | Primary: Internal Medicine

## 2020-07-04 NOTE — Progress Notes (Signed)
Dear Dr. Areta Haber:    [name] [name] is a 55 y.o. female seen today for nutrition counseling and Medical Nutrition Therapy for weight loss and healthy eating.    Weight Loss Trial Visit   3 of 4(6)    Ht:    64"   Wt:   288.8#  Wt: 290.4# (05/30/2020)   BMI:  Body mass index is 49.57 kg/m.    Past Medical History includes:  Past Medical History:   Diagnosis Date   . Asthma    . COVID-19 11/2018, 02/2020   . Headache    . Hx of cholecystectomy 1999   . Hypertension    . Morbid obesity (HCC)        Current Medications:  Current Outpatient Medications   Medication Sig Dispense Refill   . pregabalin (Lyrica) 150 mg capsule Take 1 Capsule by mouth two (2) times a day. Max Daily Amount: 300 mg. 60 Capsule 2   . benzonatate (TESSALON) 100 mg capsule Take 1-2 cap orally every 8 hours as needed for cough. Swallow pills whole     . fluticasone propion-salmeteroL (ADVAIR/WIXELA) 250-50 mcg/dose diskus inhaler Take 1 Puff by inhalation two (2) times a day.     Marland Kitchen amLODIPine (NORVASC) 5 mg tablet Take 5 mg by mouth daily.     . multivitamin (ONE A DAY) tablet Take 1 Tab by mouth daily.     Marland Kitchen albuterol (PROAIR HFA) 90 mcg/actuation inhaler Take 2 Puffs by inhalation every six (6) hours as needed for Wheezing. Indications: ACUTE ASTHMA ATTACK 1 Inhaler 5   . cetirizine (ZYRTEC) 10 mg tablet Take 1 Tab by mouth daily. Indications: PERENNIAL ALLERGIC RHINITIS 30 Tab 5   . fluticasone (FLONASE) 50 mcg/actuation nasal spray 2 Sprays by Both Nostrils route daily. Indications: ALLERGIC RHINITIS 1 Bottle 5          07/04/20 1311   Diabetes History   Type of Diabetes Unknown   Anything That Gets in the Way of Your Learning None of the above   Diet History   Have You Run Out or Worried About Having Enough Food the Last 12 Months No   Do You Follow a Meal Plan for Diabetes N/A   Do You Skip Meals Yes   Which Meal/s Do You Skip Lunch  (Work dictates ability to have a lunch.  She is trying to at least eat a Two Good Greek yogurt during these  scheduling conflicts.)   What Do You Drink on a Daily Basis and How Much Coffee (sweetened);Water;Soda (not diet)   Amount of Coffee (with Sugar) Drank (oz) 16 (2 Cups)  (Tried unsweet vanilla almond milk as creamer, didn't like.  Switching to half calf to ween off of caffeine.)   Amount of Soda Drank (oz) 16 (2 Cups)  (No change.)   Amount of Water Drank (oz) 56 (7 Cups)   Do You Read or Use Nutrition Labels as a Dietary Guide Yes   Weight History in the Last Three Months Loss  (1.6# weight loss since 05/30/2020.)   Food Record Completed Yes   Who Does the Grocery Shopping Self   Who Cooks Your Meals Self   How Many Times Per Week Do You Eat Foods From Outside the Home 1-2  ([employer])   Physical Activity   Are You Physically Active No   Do You Have Difficulty Exercising Yes  (Back pain, seeing a spine specialist.  Took herself off of Lyrica due to concern with  weight gain as side affect however now increase in pain and less physically active.)   Lab/Test Results   Weight 131 kg (288 lb 12.8 oz)      Food records were reviewed at today's visit ([employer]) and suggestions were made for improvement. She feels the app is too cumbersome and wishes to keep a paper/notepad food diary which is fine. Her caloric intake ranged from~ 800 to 2600 calories on any given day.  Discussed establishing an eating regimen to assist with meeting macronutrient needs, portion control, and normal eating behaviors.    Reviewed with Sunday Corn the 1200 calorie meal plan, Flexible Meal Planning for Weight Loss and the Plate Method for Meal Planning.She had her  written materials with her during the appointment.  Practiced planning meals using food models to demonstrate use of the Plate Method and to visualize appropriate serving sizes. Discussed acceptable protein supplements, she is struggling with them, has not found one that she likes.    [name] [name] is motivated to make lifestyle changes to improve her overall health and  help her to lose weight.  She is to continue with previously established goals and these are attached for reference.      [name] [name] was a pleasure to work with today.  She has my contact information for questions as needed.  Follow-up appointment scheduled for Aug 15, 2020.    Alessandra Grout, RD   Registered Dietitian Nutritionist  [facility]  [address] [place], [place], [place] [address]  [phone]    Nutrition Goals:  - Keep a daily food diary - Bring to every appointment  - Do not skip meals - goal of 3 meals/day, limit snacking between meals.  o Eat within 1 -1.5 hours of waking.   o Recommend using the healthy snack list or acceptable protein supplement as an option for a meal instead of skipping a meal.  - Eat every 4-6 hours.  - Use the plate method diagram (picture of plate with servings from each food group) and/or meal plan provided by your Dietitian.  o Incorporate more lean protein - eat protein first at meals.  - Goal of 60-75 grams (g) protein per day.  o  of your plate should be non-starchy vegetables (refer to vegetable list).  o Limit starches for each meal to 1/3-1/2 cup (e.g. corn, peas, potatoes, pasta, rice, beans).  o Reduce and work towards eliminating fried foods.  o Reduce and work towards eliminating refined carbohydrates (chips, cookies - no snacks or foods outside of label reading guidelines for grams of sugar and fat).  - Fluids:  o Drink 64 oz. of fluid per day; water is highly recommended (avoid carbonation (bubbles) and sugar sweetened beverages).  o Start cutting back on caffeine - goal is zero caffeine by your last appointment.  o Do not use straws.  - Read food labels:  o Before surgery keep added sugar to 9 g or less for yogurt, 5 g or less for cereals, and 4 g or less for jelly and jam.  o After surgery keep added sugar and total fat to 3 g or less (3 gram rule).  - Eating practices:  o Start sampling protein supplements  (supplement should contain less than 3 g of fat and sugar and provide 20 g or more of protein per 8-12 oz. serving).  Refer to acceptable protein supplement list.  o Start practicing the 30-30 rule; do not drink fluids  30 minutes before or after meals.  o Chew, chew, chew - chew each bite of food 25 - 30 times.  o Eat slowly; one meal should take 30 minutes to finish.  - Lifestyle behaviors:  o Tobacco cessation 3 months prior to surgery- NO SMOKING EVER  o Abstain from alcohol for 3 months prior to surgery - no alcohol for one year after surgery.  o Exercise a minimum 150 minutes per week (30 minutes per day, 5 days per week) - ultimate goal is to exercise 7 days per week.      Call my Registered Dietitian Nutritionist, Alessandra Grout, RD, at [phone] if I have any questions.

## 2020-07-16 ENCOUNTER — Ambulatory Visit
Admit: 2020-07-16 | Discharge: 2020-07-16 | Payer: PRIVATE HEALTH INSURANCE | Attending: Sports Medicine | Primary: Internal Medicine

## 2020-07-16 ENCOUNTER — Ambulatory Visit: Attending: Sports Medicine | Primary: Internal Medicine

## 2020-07-16 DIAGNOSIS — M7711 Lateral epicondylitis, right elbow: Secondary | ICD-10-CM

## 2020-07-16 MED ORDER — MELOXICAM 15 MG TAB
15 mg | ORAL_TABLET | ORAL | 0 refills | Status: DC
Start: 2020-07-16 — End: 2020-08-21

## 2020-07-16 MED ORDER — METHYLPREDNISOLONE 40 MG/ML SUSP FOR INJECTION
40 mg/mL | Freq: Once | INTRAMUSCULAR | Status: AC
Start: 2020-07-16 — End: 2020-07-16
  Administered 2020-07-16: 14:00:00 via INTRABURSAL

## 2020-07-16 NOTE — Procedures (Signed)
PROCEDURE NOTE:  Time out: 1020am  * Patient was identified by name and date of birth   * Agreement on procedure being performed was verified  * Risks and Benefits explained to the patient  * Procedure site verified and marked as necessary  * Patient was positioned for comfort  * Consent was signed and verified.  Risks/benefits including but not limited to bleeding, infection, and scarring discussed and Pt wishes to proceed with procedure.    The area was prepped with betadine.  Ethyl chloride spray was used. Under sterile technique with ultrasound guidance using Terason uSmart 3200T with 4-15 MHz linear transducer. Indication for ultrasound guidance habitus.   1cc of 40mg /cc methylprednisolone acetate  and 1cc lidocaine were injected into point of maximal tenderness of right elbow lateral epicondyle tendon origin using distal approach.    Sterile gauze used to clean the area.  Blood loss minimal.  Noticed improvement in pain Sx within 5 minutes (now rated 0-1/10).    Tolerated procedure well.  Discussed possible signs/Sx of infxn, and advised to seek care if concerned.    Ultrasound images (if applicable) digitally attached to CPT order in patients chart.

## 2020-07-16 NOTE — Progress Notes (Signed)
HISTORY OF PRESENT ILLNESS    [name] [name] [birth year] is a 55 y.o. year old female comes in today as new patient for: right elbow pain    Patients symptoms have been present for 6 weeks.  Pain level 6/10 lateral. It has worsened with lifting things.  Patient has tried:  Aleve, tylenol, voltaren gel, ace wrap, elbow brace without benefit.  It is described as pain in elbow seemed to start after started exercising.    Past Surgical History:   Procedure Laterality Date   ??? COLONOSCOPY N/A 01/25/2020    COLONOSCOPY performed by Ihor Austin, MD at [facility] ENDOSCOPY   ??? HX APPENDECTOMY  2006   ??? HX CESAREAN SECTION  1987, 1994, 1998   ??? HX CHOLECYSTECTOMY  1998   ??? HX HYSTERECTOMY      uterus removed   ??? HX HYSTERECTOMY  2012     Social History     Socioeconomic History   ??? Marital status: MARRIED   Tobacco Use   ??? Smoking status: Never Smoker   ??? Smokeless tobacco: Never Used   Vaping Use   ??? Vaping Use: Never used   Substance and Sexual Activity   ??? Alcohol use: Yes     Alcohol/week: 1.0 standard drink     Types: 1 Glasses of wine per week     Comment: once per month   ??? Drug use: No   ??? Sexual activity: Yes     Partners: Male     Birth control/protection: Surgical   Other Topics Concern   ??? Weight Concern Yes      Current Outpatient Medications   Medication Sig Dispense Refill   ??? pregabalin (Lyrica) 150 mg capsule Take 1 Capsule by mouth two (2) times a day. Max Daily Amount: 300 mg. 60 Capsule 2   ??? benzonatate (TESSALON) 100 mg capsule Take 1-2 cap orally every 8 hours as needed for cough. Swallow pills whole     ??? fluticasone propion-salmeteroL (ADVAIR/WIXELA) 250-50 mcg/dose diskus inhaler Take 1 Puff by inhalation two (2) times a day.     ??? amLODIPine (NORVASC) 5 mg tablet Take 5 mg by mouth daily.     ??? multivitamin (ONE A DAY) tablet Take 1 Tab by mouth daily.     ??? albuterol (PROAIR HFA) 90 mcg/actuation inhaler Take 2 Puffs by inhalation every six (6) hours as needed for Wheezing. Indications: ACUTE ASTHMA  ATTACK 1 Inhaler 5   ??? cetirizine (ZYRTEC) 10 mg tablet Take 1 Tab by mouth daily. Indications: PERENNIAL ALLERGIC RHINITIS 30 Tab 5   ??? fluticasone (FLONASE) 50 mcg/actuation nasal spray 2 Sprays by Both Nostrils route daily. Indications: ALLERGIC RHINITIS 1 Bottle 5     Past Medical History:   Diagnosis Date   ??? Asthma    ??? COVID-19 11/2018, 02/2020   ??? Headache    ??? Hx of cholecystectomy 1999   ??? Hypertension    ??? Morbid obesity (HCC)      Family History   Problem Relation Age of Onset   ??? Heart Disease Mother    ??? Hypertension Mother    ??? Diabetes Maternal Aunt    ??? Cancer Maternal Grandmother         colon         ROS:  No numb, tingle      Objective:  Pulse 98    Resp 16    Ht 5\' 4"  (1.626 m)    Wt 291 lb  3.2 oz (132.1 kg)    SpO2 98%    BMI 49.98 kg/m??   NEURO:  Sensation intact light touch upper and lower extremities.  Biceps & Triceps reflexes +2/4 bilaterally.  right hand dominant. Spurling Negative bilateral   M/S:  right elbow/wrist:  Negative boney tenderness. Phalen's negative.  Tinel's negative.  Strength +5/5 bilateral .  Piano key sign Negative bilateral .  Carpal bone motion normal.  Finklestein's negative  TFCC Load Test negative.  RIGHT medial epicondyle(s) with TTP worsened with wrist extension.   negative muscular atrophy.          Assessment/Plan:     ICD-10-CM ICD-9-CM    1. Lateral epicondylitis, right elbow  M77.11 726.32 AMB POC [place], SONO GUIDE NEEDLE      PR INJECT TENDON ORIGIN/INSERT      meloxicam (MOBIC) 15 mg tablet      AMB SUPPLY ORDER   2. Elbow pain, right  M25.521 719.42 meloxicam (MOBIC) 15 mg tablet       Patient (or guardian if minor) verbalizes understanding of evaluation and plan.    Will start brace, HEP and Rx for mobic after injected today as above and plan follow-up 6 weeks.

## 2020-07-29 ENCOUNTER — Other Ambulatory Visit: Payer: Self-pay | Admitting: Family Medicine

## 2020-08-01 ENCOUNTER — Encounter

## 2020-08-05 ENCOUNTER — Encounter

## 2020-08-15 ENCOUNTER — Inpatient Hospital Stay: Admit: 2020-08-15 | Payer: PRIVATE HEALTH INSURANCE | Primary: Internal Medicine

## 2020-08-15 NOTE — Progress Notes (Signed)
 Dear Dr. Alberteen:    [name] [name] is a 55 y.o. female seen today for nutrition counseling and Medical Nutrition Therapy for weight loss and healthy eating.    Weight Loss Trial Visit   4 of 4(6)    Ht:    64   Wt:   285.2#  Wt (07/04/20): 288.8#  Wt (05/30/20): 290.4#   BMI:  Body mass index is 49.13 kg/m.    Past Medical History includes:  Past Medical History:   Diagnosis Date   . Asthma    . COVID-19 11/2018, 02/2020   . Headache    . Hx of cholecystectomy 1999   . Hypertension    . Morbid obesity (HCC)        Current Medications:  Current Outpatient Medications   Medication Sig Dispense Refill   . meloxicam (MOBIC) 15 mg tablet Take 1 tab daily as needed pain with food. 90 Tablet 0   . pregabalin (Lyrica) 150 mg capsule Take 1 Capsule by mouth two (2) times a day. Max Daily Amount: 300 mg. 60 Capsule 2   . benzonatate (TESSALON) 100 mg capsule Take 1-2 cap orally every 8 hours as needed for cough. Swallow pills whole     . fluticasone propion-salmeteroL (ADVAIR/WIXELA) 250-50 mcg/dose diskus inhaler Take 1 Puff by inhalation two (2) times a day.     SABRA amLODIPine (NORVASC) 5 mg tablet Take 5 mg by mouth daily.     . multivitamin (ONE A DAY) tablet Take 1 Tab by mouth daily.     SABRA albuterol (PROAIR HFA) 90 mcg/actuation inhaler Take 2 Puffs by inhalation every six (6) hours as needed for Wheezing. Indications: ACUTE ASTHMA ATTACK 1 Inhaler 5   . cetirizine (ZYRTEC) 10 mg tablet Take 1 Tab by mouth daily. Indications: PERENNIAL ALLERGIC RHINITIS 30 Tab 5   . fluticasone (FLONASE) 50 mcg/actuation nasal spray 2 Sprays by Both Nostrils route daily. Indications: ALLERGIC RHINITIS 1 Bottle 5          08/15/20 1302   Diabetes History   Type of Diabetes Unknown   Family History of Diabetes N/A   Anything That Gets in the Way of Your Learning None of the above   Diet History   Have You Run Out or Worried About Having Enough Food the Last 12 Months No   Do You Follow a Meal Plan for Diabetes N/A   Do You Skip Meals No    What Do You Drink on a Daily Basis and How Much Coffee (sweetened);Water    Amount of Coffee (with Sugar) Drank (oz) 8 (1 Cup)  (Half Caf with tsp creamer)   Amount of Water  Drank (oz) 48 (6 Cups)   Do You Read or Use Nutrition Labels as a Dietary Guide Yes  (Reinforced rules of 3 for sugar and fat.)   Weight History in the Last Three Months Loss  (Lost 2.6# since previous appointment. Lost 4.2# overall.)   Food Record Completed Yes   Who Does the Grocery Shopping Self   Who Cooks Your Meals Self   Who Do You Live With With spouse/partner and children   How Many Times Per Week Do You Eat Foods From Outside the Home 1-2   Foods You Find Difficult to Eat No   Physical Activity   Are You Physically Active Yes  (Joined [employer].)   Do You Have Difficulty Exercising Yes  (Back issues.  Reports she is scheduled for back surgery in 3 weeks.)  What Type of Activity Do You Engage In Walking;Other (comment)  (Walks laps in the pool once a week.  Alternates elliptical/bike other 2 days of week.)   How Many Days Are You Active Per Week  2-4   Lab/Test Results   Weight 129.8 kg (286 lb 3.2 oz)      Food records were reviewed at today's visit and suggestions were made for improvement. Benefits of keeping detailed daily food records for long term weight loss success were discussed.   [name] [name] is now eating 3  meals and 2 snacks a day.      Breakfast: Protein drink or mini granola bar or [place] bacon, eggs on spinach wrap and a banana  Lunch: Snack pack with cheese, nuts, raisins or [place] and cheese sandwich and a [place] yogurt (Oikos)  Dinner: Variety of proteins (steak, chicken), a small serving of starch, and a vegetable  Snacks: Varies; nuts, fruit, chips, cookies  NOTE: She has made changes in her eating habits, areas of focus are protein choices (avoid fatty meat such as hot dogs and fried chicken fingers or meals consisting of chicken/waffles when dining out), greater scrutiny of food labels I.e. [employer]  Protein which meets recommended protein content however contains 21 grams of sugar. She switched to seltzer water  to eliminate sugar now needs to work on eliminating carbonation.  She is getting closer to eliminating caffeine.    Reviewed with Erminio CHRISTELLA Rummer the Flexible Meal Plan for Weight Loss and the Plate Method for Meal Planning.  She brings her written materials with her to appointments. We practiced reading food labels and reinforced the 3 gram rule for fat and sugar.  Recommended she just use the list provided with regard to protein drink options. Also reviewed recommended dietary supplement regimen.    [name] [name] is motivated to make lifestyle changes to improve her overall health and help her to lose weight.  She is to continue with previously established goals and these are attached for reference.      [name] [name] was a pleasure to work with today.  She has my contact information for questions as needed.  She was provided the number to schedule her next two nutrition appointments with a bariatric dietitian.    Jori LITTIE Hamilton, RD   Registered Dietitian Nutritionist  [facility]  [address], [place], [place], [place] [address]  [phone]    Nutrition Goals:  - Keep a daily food diary - Bring to every appointment  - Do not skip meals - goal of 3 meals/day, limit snacking between meals.  o Eat within 1 -1.5 hours of waking.   o Recommend using the healthy snack list or acceptable protein supplement as an option for a meal instead of skipping a meal.  - Eat every 4-6 hours.  - Use the plate method diagram (picture of plate with servings from each food group) and/or meal plan provided by your Dietitian.  o Incorporate more lean protein - eat protein first at meals.  - Goal of 60-75 grams (g) protein per day.  o  of your plate should be non-starchy vegetables (refer to vegetable list).  o Limit starches for each meal to 1/3-1/2 cup (e.g. corn, peas,  potatoes, pasta, rice, beans).  o Reduce and work towards eliminating fried foods.  o Reduce and work towards eliminating refined carbohydrates (chips, cookies - no snacks or foods outside of label reading guidelines for grams of sugar  and fat).  - Fluids:  o Drink 64 oz. of fluid per day; water  is highly recommended (avoid carbonation (bubbles) and sugar sweetened beverages).  o Start cutting back on caffeine - goal is zero caffeine by your last appointment.  o Do not use straws.  - Read food labels:  o Before surgery keep added sugar to 9 g or less for yogurt, 5 g or less for cereals, and 4 g or less for jelly and jam.  o After surgery keep added sugar and total fat to 3 g or less (3 gram rule).  - Eating practices:  o Start sampling protein supplements (supplement should contain less than 3 g of fat and sugar and provide 20 g or more of protein per 8-12 oz. serving).  Refer to acceptable protein supplement list.  o Start practicing the 30-30 rule; do not drink fluids 30 minutes before or after meals.  o Chew, chew, chew - chew each bite of food 25 - 30 times.  o Eat slowly; one meal should take 30 minutes to finish.  - Lifestyle behaviors:  o Tobacco cessation 3 months prior to surgery- NO SMOKING EVER  o Abstain from alcohol for 3 months prior to surgery - no alcohol for one year after surgery.  o Exercise a minimum 150 minutes per week (30 minutes per day, 5 days per week) - ultimate goal is to exercise 7 days per week.      Call my Registered Dietitian Nutritionist, Jori LITTIE Hamilton, RD, at [phone] if I have any questions.

## 2020-08-20 ENCOUNTER — Ambulatory Visit

## 2020-08-20 NOTE — H&P (Signed)
Pre-Admission History and Physical    Patient: [name] [name]   MRN: [id number]   SSN: [id number]   Date of Birth: 02-03-1966   Age: 55 y.o.   Sex: female     Patient scheduled for: left L4.5 laminectomy discectomy with tubular retractor.  Date of surgery: 08/22/2020.  Surgeon: Pamalee Leyden, MD    HPI:  [name] [name] is a 55 y.o. female with severe back pain radiating down her left leg with extensive pressure.   MRI demonstrates L4/5 disc protrusion with annular tear causing some relative foraminal lateral recess stenosis on the left at L4/5.   This patient has failed the presurgical conservative treatments  including physical therapy, spinal block injections and medications. Pain has impacted the patient's functional ability. She is being admitted for surgical intervention.       Past Medical History:   Diagnosis Date   ??? Asthma    ??? COVID-19 11/2018, 02/2020   ??? Headache    ??? Hx of cholecystectomy 1999   ??? Hypertension    ??? Morbid obesity (HCC)      Social History     Socioeconomic History   ??? Marital status: MARRIED   Tobacco Use   ??? Smoking status: Never Smoker   ??? Smokeless tobacco: Never Used   Vaping Use   ??? Vaping Use: Never used   Substance and Sexual Activity   ??? Alcohol use: Yes     Alcohol/week: 1.0 standard drink     Types: 1 Glasses of wine per week     Comment: once per month   ??? Drug use: No   ??? Sexual activity: Yes     Partners: Male     Birth control/protection: Surgical   Other Topics Concern   ??? Weight Concern Yes     Past Surgical History:   Procedure Laterality Date   ??? COLONOSCOPY N/A 01/25/2020    COLONOSCOPY performed by Ihor Austin, MD at [facility] ENDOSCOPY   ??? HX APPENDECTOMY  2006   ??? HX CESAREAN SECTION  1987, 1994, 1998   ??? HX CHOLECYSTECTOMY  1998   ??? HX HYSTERECTOMY      uterus removed   ??? HX HYSTERECTOMY  2012     Family History   Problem Relation Age of Onset   ??? Heart Disease Mother    ??? Hypertension Mother    ??? Diabetes Maternal Aunt    ??? Cancer Maternal Grandmother          colon     Allergies   Allergen Reactions   ??? Latex Hives   ??? Pollen Extracts Cough, Sneezing and Other (comments)     Irritates asthma   ??? Tomato Hives     Current Outpatient Medications   Medication Sig Dispense Refill   ??? meloxicam (MOBIC) 15 mg tablet Take 1 tab daily as needed pain with food. 90 Tablet 0   ??? pregabalin (Lyrica) 150 mg capsule Take 1 Capsule by mouth two (2) times a day. Max Daily Amount: 300 mg. 60 Capsule 2   ??? benzonatate (TESSALON) 100 mg capsule Take 1-2 cap orally every 8 hours as needed for cough. Swallow pills whole     ??? fluticasone propion-salmeteroL (ADVAIR/WIXELA) 250-50 mcg/dose diskus inhaler Take 1 Puff by inhalation two (2) times a day.     ??? amLODIPine (NORVASC) 5 mg tablet Take 5 mg by mouth daily.     ??? multivitamin (ONE A DAY) tablet Take 1 Tab by  mouth daily.     ??? albuterol (PROAIR HFA) 90 mcg/actuation inhaler Take 2 Puffs by inhalation every six (6) hours as needed for Wheezing. Indications: ACUTE ASTHMA ATTACK 1 Inhaler 5   ??? cetirizine (ZYRTEC) 10 mg tablet Take 1 Tab by mouth daily. Indications: PERENNIAL ALLERGIC RHINITIS 30 Tab 5   ??? fluticasone (FLONASE) 50 mcg/actuation nasal spray 2 Sprays by Both Nostrils route daily. Indications: ALLERGIC RHINITIS 1 Bottle 5       ROS:  Denies chills, fever,night sweats,  bowel or bladder dysfunction, unexplained weight loss/weight gain, chest pain, sob or anxiety.    Physical Examination    Gen: Well developed, well nourished 55 y.o. female with positive SLR on the left. Good strength of her ehl, ta, hams and quads, deltoids, triceps and biceps. No clonus, no hoffmans, no babinski. 2+ pulses. Soft abdomen. Normal reflexes achilles and patella.    Assessment and Plan    Due to the pt's persistent symptoms unrelieved by conservative measure LAVENDER STANKE is being admitted to undergo surgical intervention. The post-operative plan of care consists of physical therapy, home health and a 2 week f/u office visit.  The risks,  benefits, complications and alternatives to surgery have been discussed in detail with the patient.  The patient understands and agrees to proceed.

## 2020-08-21 NOTE — Care Coordination-Inpatient (Signed)
Call placed to patient, ID verified x 2. Patient  has decided with her surgeon to have a L4/l?5 laminectomy discectomy to decrease her pain. Topics discussed included surgery preparation, what to expect the day of surgery, medications, physical and occupational therapy, and discharge planning.  It was discussed that this is considered an elective surgery and that prior to the surgery she needs to make decisions such as arranging for help at home once she is  discharged.Patient agreed to get home ready for surgery and to have a ride arranged to go home. She has assistance at her home and a rolling walker in her possession.  Instructions were given for CHG bathing. Patient will compete this procedure this evening. She was reminded not to apply any deoderant, perfumes, makeup or lotions to her skin the morning of her surgery. She will remain NPO after midnight the night before her surgery and will take only the medications she was instructed to take by her surgeon the morning of surgery with a sip of water.    A spine surgery education book will be provided to patient post op prior to discharge. Education regarding the importance of early and frequent ambulation to avoid surgical complications and to assist with pain was provided. Recommended the use of ice to assist with pain and swelling post op for 20 minutes an hour, not to be placed directly on her skin. Patient verbalized understanding of all information provided.  Opportunity was given to ask questions and phone number of the Orthopaedic Program Coordinator  was given for any questions or concerns that may arise later.

## 2020-08-21 NOTE — Interval H&P Note (Signed)
PRE-SURGICAL INSTRUCTIONS        Patient's Name:  [name] [name]      IDPOE'U Date:  08/21/2020            Covid Testing Date and Time:    Surgery Date:  08/22/2020                1. Do NOT eat or drink anything, including candy, gum, or ice chips after midnight on 08/22/20, unless you have specific instructions from your surgeon or anesthesia provider to do so.  2. You may brush your teeth before coming to the hospital.  3. No smoking 24 hours prior to the day of surgery.  4. No alcohol 24 hours prior to the day of surgery.  5. No recreational drugs for one week prior to the day of surgery.  6. Leave all valuables, including money/purse, at home.  7. Remove all jewelry, nail polish, acrylic nails, and makeup (including mascara); no lotions powders, deodorant, or perfume/cologne/after shave on the skin.  8. Follow instruction for Hibiclens washes and CHG wipes from surgeon's office.   9. Glasses/contact lenses and dentures may be worn to the hospital.  They will be removed prior to surgery.  10. Call your doctor if symptoms of a cold or illness develop within 24-48 hours prior to your surgery.  11.  If you are having an outpatient procedure, please make arrangements for a responsible ADULT TO DRIVE YOU HOME AFTER SURGERY and stay with you for 24 hours after your surgery.  12.  ONE VISITOR in the hospital at this time for outpatient procedures.  Exceptions may be made for surgical admissions, per nursing unit guidelines      Special Instructions:      Bring list of CURRENT medications.  Bring inhaler.    Bring any pertinent legal medical records.  Take these medications the morning of surgery with a sip of water:  Norvasc          On the day of surgery, come in the main entrance of [facility].  Let the security guard at the desk know you are there for surgery.  A staff member will come escort you to the surgical area on the second floor.    If you have any questions or concerns, please do not hesitate to  call:     (Prior to the day of surgery) PAT department:  [phone]   (Day of surgery) Pre-Op department:  [phone]    These surgical instructions were reviewed with [name] during the PAT phone call.

## 2020-08-22 ENCOUNTER — Inpatient Hospital Stay: Payer: PRIVATE HEALTH INSURANCE

## 2020-08-22 ENCOUNTER — Ambulatory Visit: Admit: 2020-08-22 | Payer: PRIVATE HEALTH INSURANCE | Primary: Internal Medicine

## 2020-08-22 MED ORDER — ONDANSETRON (PF) 4 MG/2 ML INJECTION
4 mg/2 mL | INTRAMUSCULAR | Status: AC
Start: 2020-08-22 — End: ?

## 2020-08-22 MED ORDER — PHENYLEPHRINE 1 MG/10 ML (100 MCG/ML) IN NS IV SYRINGE
1 mg/0 mL (00 mcg/mL) | INTRAVENOUS | Status: DC | PRN
Start: 2020-08-22 — End: 2020-08-22
  Administered 2020-08-22 (×2): via INTRAVENOUS

## 2020-08-22 MED ORDER — VANCOMYCIN 1,000 MG IV SOLR
1000 mg | INTRAVENOUS | Status: AC
Start: 2020-08-22 — End: ?

## 2020-08-22 MED ORDER — HYDROMORPHONE (PF) 2 MG/ML IJ SOLN
2 mg/mL | INTRAMUSCULAR | Status: DC | PRN
Start: 2020-08-22 — End: 2020-08-22
  Administered 2020-08-22 (×3): via INTRAVENOUS

## 2020-08-22 MED ORDER — SUCCINYLCHOLINE CHLORIDE 20 MG/ML INJECTION
20 mg/mL | INTRAMUSCULAR | Status: DC | PRN
Start: 2020-08-22 — End: 2020-08-22
  Administered 2020-08-22: 17:00:00 via INTRAVENOUS

## 2020-08-22 MED ORDER — GELATIN ADSORBABLE 100 TOPICAL SPONGE
100 | CUTANEOUS | Status: DC | PRN
Start: 2020-08-22 — End: 2020-08-22
  Administered 2020-08-22: 18:00:00 via TOPICAL

## 2020-08-22 MED ORDER — HYDROMORPHONE 2 MG/ML INJECTION SOLUTION
2 mg/mL | INTRAMUSCULAR | Status: AC
Start: 2020-08-22 — End: ?

## 2020-08-22 MED ORDER — ONDANSETRON (PF) 4 MG/2 ML INJECTION
4 mg/2 mL | INTRAMUSCULAR | Status: DC | PRN
Start: 2020-08-22 — End: 2020-08-22
  Administered 2020-08-22: 18:00:00 via INTRAVENOUS

## 2020-08-22 MED ORDER — THROMBIN (BOVINE) 5,000 UNIT TOPICAL SOLUTION
5000 unit | CUTANEOUS | Status: DC | PRN
Start: 2020-08-22 — End: 2020-08-22
  Administered 2020-08-22: 18:00:00 via TOPICAL

## 2020-08-22 MED ORDER — FENTANYL CITRATE (PF) 50 MCG/ML IJ SOLN
50 mcg/mL | INTRAMUSCULAR | Status: AC
Start: 2020-08-22 — End: ?

## 2020-08-22 MED ORDER — HYDROMORPHONE 2 MG/ML INJECTION SOLUTION
2 mg/mL | INTRAMUSCULAR | Status: DC | PRN
Start: 2020-08-22 — End: 2020-08-22

## 2020-08-22 MED ORDER — LACTATED RINGERS IV
INTRAVENOUS | Status: DC
Start: 2020-08-22 — End: 2020-08-22

## 2020-08-22 MED ORDER — GLYCOPYRROLATE 0.2 MG/ML IJ SOLN
0.2 mg/mL | INTRAMUSCULAR | Status: DC | PRN
Start: 2020-08-22 — End: 2020-08-22
  Administered 2020-08-22: 18:00:00 via INTRAVENOUS

## 2020-08-22 MED ORDER — FENTANYL CITRATE (PF) 50 MCG/ML IJ SOLN
50 mcg/mL | INTRAMUSCULAR | Status: DC | PRN
Start: 2020-08-22 — End: 2020-08-22

## 2020-08-22 MED ORDER — SODIUM CHLORIDE 0.9 % IJ SYRG
Freq: Three times a day (TID) | INTRAMUSCULAR | Status: DC
Start: 2020-08-22 — End: 2020-08-22

## 2020-08-22 MED ORDER — LIDOCAINE (PF) 20 MG/ML (2 %) IJ SOLN
20 mg/mL (2 %) | INTRAMUSCULAR | Status: DC | PRN
Start: 2020-08-22 — End: 2020-08-22
  Administered 2020-08-22: 17:00:00 via INTRAVENOUS

## 2020-08-22 MED ORDER — WATER FOR INJECTION, STERILE INJECTION
1 gram | Freq: Once | INTRAMUSCULAR | Status: AC
Start: 2020-08-22 — End: 2020-08-22
  Administered 2020-08-22: 17:00:00 via INTRAVENOUS

## 2020-08-22 MED ORDER — LACTATED RINGERS IV
INTRAVENOUS | Status: DC | PRN
Start: 2020-08-22 — End: 2020-08-22
  Administered 2020-08-22: 17:00:00 via INTRAVENOUS

## 2020-08-22 MED ORDER — BUPIVACAINE (PF) 0.5 % (5 MG/ML) IJ SOLN
0.5 % (5 mg/mL) | INTRAMUSCULAR | Status: DC | PRN
Start: 2020-08-22 — End: 2020-08-22
  Administered 2020-08-22: 18:00:00 via SUBCUTANEOUS

## 2020-08-22 MED ORDER — DEXAMETHASONE SODIUM PHOSPHATE 4 MG/ML IJ SOLN
4 mg/mL | INTRAMUSCULAR | Status: DC | PRN
Start: 2020-08-22 — End: 2020-08-22
  Administered 2020-08-22: 17:00:00 via INTRAVENOUS

## 2020-08-22 MED ORDER — SODIUM CHLORIDE 0.9 % IJ SYRG
INTRAMUSCULAR | Status: DC | PRN
Start: 2020-08-22 — End: 2020-08-22

## 2020-08-22 MED ORDER — FAMOTIDINE 20 MG TAB
20 mg | Freq: Once | ORAL | Status: DC
Start: 2020-08-22 — End: 2020-08-22

## 2020-08-22 MED ORDER — BUPIVACAINE (PF) 0.5 % (5 MG/ML) IJ SOLN
0.5 % (5 mg/mL) | INTRAMUSCULAR | Status: AC
Start: 2020-08-22 — End: ?

## 2020-08-22 MED ORDER — VANCOMYCIN 1,000 MG IV SOLR
1000 mg | INTRAVENOUS | Status: DC | PRN
Start: 2020-08-22 — End: 2020-08-22
  Administered 2020-08-22: 18:00:00

## 2020-08-22 MED ORDER — NEOSTIGMINE METHYLSULFATE 3 MG/3 ML (1 MG/ML) IV SYRINGE
3 mg/ mL (1 mg/mL) | INTRAVENOUS | Status: DC | PRN
Start: 2020-08-22 — End: 2020-08-22
  Administered 2020-08-22: 18:00:00 via INTRAVENOUS

## 2020-08-22 MED ORDER — ROCURONIUM 10 MG/ML IV
10 mg/mL | INTRAVENOUS | Status: DC | PRN
Start: 2020-08-22 — End: 2020-08-22
  Administered 2020-08-22 (×3): via INTRAVENOUS

## 2020-08-22 MED ORDER — OXYCODONE 5 MG TAB
5 mg | ORAL_TABLET | Freq: Four times a day (QID) | ORAL | 0 refills | Status: AC | PRN
Start: 2020-08-22 — End: 2020-08-29

## 2020-08-22 MED ORDER — INSULIN LISPRO 100 UNIT/ML INJECTION
100 unit/mL | Freq: Once | SUBCUTANEOUS | Status: DC
Start: 2020-08-22 — End: 2020-08-22

## 2020-08-22 MED ORDER — MIDAZOLAM 1 MG/ML IJ SOLN
1 mg/mL | INTRAMUSCULAR | Status: DC | PRN
Start: 2020-08-22 — End: 2020-08-22
  Administered 2020-08-22: 17:00:00 via INTRAVENOUS

## 2020-08-22 MED ORDER — KETOROLAC TROMETHAMINE 15 MG/ML INJECTION
15 mg/mL | INTRAMUSCULAR | Status: AC
Start: 2020-08-22 — End: ?

## 2020-08-22 MED ORDER — FENTANYL CITRATE (PF) 50 MCG/ML IJ SOLN
50 mcg/mL | INTRAMUSCULAR | Status: DC | PRN
Start: 2020-08-22 — End: 2020-08-22
  Administered 2020-08-22: 17:00:00 via INTRAVENOUS

## 2020-08-22 MED ORDER — LIDOCAINE (PF) 10 MG/ML (1 %) IJ SOLN
10 mg/mL (1 %) | INTRAMUSCULAR | Status: DC | PRN
Start: 2020-08-22 — End: 2020-08-22

## 2020-08-22 MED ORDER — GELATIN ADSORBABLE 100 CM TOPICAL SPONGE COMPRESSED
100 cm | CUTANEOUS | Status: AC
Start: 2020-08-22 — End: ?

## 2020-08-22 MED ORDER — MIDAZOLAM 1 MG/ML IJ SOLN
1 mg/mL | INTRAMUSCULAR | Status: AC
Start: 2020-08-22 — End: ?

## 2020-08-22 MED ORDER — PROPOFOL 10 MG/ML IV EMUL
10 mg/mL | INTRAVENOUS | Status: DC | PRN
Start: 2020-08-22 — End: 2020-08-22
  Administered 2020-08-22: 17:00:00 via INTRAVENOUS

## 2020-08-22 MED ORDER — KETOROLAC TROMETHAMINE 15 MG/ML INJECTION
15 mg/mL | INTRAMUSCULAR | Status: DC | PRN
Start: 2020-08-22 — End: 2020-08-22
  Administered 2020-08-22: 18:00:00 via INTRAVENOUS

## 2020-08-22 MED ORDER — NEOSTIGMINE METHYLSULFATE 3 MG/3 ML (1 MG/ML) IV SYRINGE
3 mg/ mL (1 mg/mL) | INTRAVENOUS | Status: AC
Start: 2020-08-22 — End: ?

## 2020-08-22 MED ORDER — THROMBIN (RECOMBINANT) 5,000 UNIT TOPICAL SOLUTION
5000 unit | CUTANEOUS | Status: AC
Start: 2020-08-22 — End: ?

## 2020-08-22 MED FILL — NEOSTIGMINE METHYLSULFATE 3 MG/3 ML (1 MG/ML) IV SYRINGE: 3 mg/ mL (1 mg/mL) | INTRAVENOUS | Qty: 3

## 2020-08-22 MED FILL — CEFAZOLIN 1 GRAM SOLUTION FOR INJECTION: 1 gram | INTRAMUSCULAR | Qty: 3000

## 2020-08-22 MED FILL — HYDROMORPHONE 2 MG/ML INJECTION SOLUTION: 2 mg/mL | INTRAMUSCULAR | Qty: 1

## 2020-08-22 MED FILL — MIDAZOLAM 1 MG/ML IJ SOLN: 1 mg/mL | INTRAMUSCULAR | Qty: 2

## 2020-08-22 MED FILL — RECOTHROM 5,000 UNIT TOPICAL SOLUTION: 5000 unit | CUTANEOUS | Qty: 1

## 2020-08-22 MED FILL — LACTATED RINGERS IV: INTRAVENOUS | Qty: 1000

## 2020-08-22 MED FILL — FENTANYL CITRATE (PF) 50 MCG/ML IJ SOLN: 50 mcg/mL | INTRAMUSCULAR | Qty: 2

## 2020-08-22 MED FILL — BUPIVACAINE (PF) 0.5 % (5 MG/ML) IJ SOLN: 0.5 % (5 mg/mL) | INTRAMUSCULAR | Qty: 30

## 2020-08-22 MED FILL — ONDANSETRON (PF) 4 MG/2 ML INJECTION: 4 mg/2 mL | INTRAMUSCULAR | Qty: 2

## 2020-08-22 MED FILL — FAMOTIDINE 20 MG TAB: 20 mg | ORAL | Qty: 1

## 2020-08-22 MED FILL — BD POSIFLUSH NORMAL SALINE 0.9 % INJECTION SYRINGE: INTRAMUSCULAR | Qty: 40

## 2020-08-22 MED FILL — KETOROLAC TROMETHAMINE 15 MG/ML INJECTION: 15 mg/mL | INTRAMUSCULAR | Qty: 2

## 2020-08-22 MED FILL — VANCOMYCIN 1,000 MG IV SOLR: 1000 mg | INTRAVENOUS | Qty: 1000

## 2020-08-22 MED FILL — SURGIFOAM 100 CM SPONGE: 100 cm | CUTANEOUS | Qty: 1

## 2020-08-22 NOTE — Op Note (Signed)
[facility]  OPERATIVE REPORT    Name:  [name] [name], [name] [name]  MR#:   [id number]  DOB:  12/28/1965  ACCOUNT #:  [id number]  DATE OF SERVICE:  08/22/2020    PREOPERATIVE DIAGNOSES:  Herniated nucleus pulposus and stenosis, L4-L5.    POSTOPERATIVE DIAGNOSIS:  Herniated nucleus pulposus and stenosis, L4-L5.    PROCEDURES PERFORMED:  Left L4-L5 hemilaminotomy, diskectomy, and medial facetectomy with tubular retractor.    SURGEON:  Pamalee Leyden, MD.    ASSISTANT:  None.    ANESTHESIA:  General endotracheal.    COMPLICATIONS:  None.    SPECIMENS REMOVED:  None.    IMPLANTS:  None.    ESTIMATED BLOOD LOSS:  Minimal.    FINDINGS:  Surgery was made difficult secondary to the patient's morbid obesity.  The patient had a disk protuberance with an extruded disk fragment.    DESCRIPTION OF PROCEDURE:  Following induction of general endotracheal anesthesia, the patient was turned to prone position on a spinal frame.  The patient was prepped and draped in the usual fashion.  An incision was made in the midline with fluoroscopic guidance.  The split-tubular initial dilator was placed to the interlaminar space at L4-L5 utilizing fluoroscopic guidance.  Once confirmed, dilators were used in a graded fashion and the split-tubular retractor placed over the dilators.  Approximately 100  distance to the spine.  The appropriate split-tubular retractor was selected and placed on the table mount.  I was unable to visualize the interlaminar space.  Surgery was difficult because of her BMI of 49.  Fluoroscopic imaging once again confirmed our surgical level.  A hemilaminotomy was done with resection of interval ligamentum flavum.  The nerves could be felt to be under tension.  I defined the sacral pedicle, the L5 pedicle, the L4 pedicle, and the L4-L5 interval.  With gentle retraction, the nerve root was freed and the underlying disk protuberance seen.  I incised it with a scalpel and initially debulked the disk with pituitaries and  a loose fragment was removed.  Palpating superiorly, inferiorly, medially, and laterally with the [facility] at the conclusion of the decompression, there was no further evidence of protrusion or nerve root compression.  The wound was copiously irrigated.  A pledget of Gelfoam and thrombin was placed over the laminotomy site.  There was no evidence of bleeding.  Vancomycin powder was instilled for infection prophylaxis.  The retractor was removed.  The incision was closed as best as possible with fascia with #1 Vicryl, subcutaneous tissue 2-0 Vicryl, and skin with 3-0 Monocryl subcuticular suture and Dermabond.  A sterile occlusive dressing was placed upon the wound.  All counts were correct.      Pamalee Leyden, MD      [id number]  D:  08/22/2020 14:28  T:  08/22/2020 19:57  JOB #:  [id number]

## 2020-08-22 NOTE — Anesthesia Pre-Procedure Evaluation (Signed)
Relevant Problems   No relevant active problems       Anesthetic History   No history of anesthetic complications            Review of Systems / Medical History  Patient summary reviewed and pertinent labs reviewed    Pulmonary        Sleep apnea: No treatment    Asthma : well controlled       Neuro/Psych   Within defined limits           Cardiovascular    Hypertension: well controlled              Exercise tolerance: >4 METS     GI/Hepatic/Renal                Endo/Other        Morbid obesity     Other Findings              Physical Exam    Airway  Mallampati: III  TM Distance: 4 - 6 cm  Neck ROM: decreased range of motion   Mouth opening: Normal     Cardiovascular    Rhythm: regular  Rate: normal         Dental  No notable dental hx       Pulmonary  Breath sounds clear to auscultation               Abdominal  GI exam deferred       Other Findings            Anesthetic Plan    ASA: 3  Anesthesia type: general          Induction: Intravenous  Anesthetic plan and risks discussed with: Patient

## 2020-08-22 NOTE — Op Note (Signed)
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 plus hnp L4     Procedure(s):  LEFT L4/5 LAMINECTOMY DISCECTOMY/TUBULAR RETRACTOR/C-ARM/NUVASIVE    Surgeon(s):  Pamalee Leyden, MD    Surgical Assistant: Surg Asst-1: Neoma Laming    Anesthesia: General     Estimated Blood Loss (mL): Minimal    Complications: None    Specimens: * No specimens in log *     Implants: * No implants in log *    Drains: * No LDAs found *    Findings: hnp l4/5    Electronically Signed by Pamalee Leyden, MD on 08/22/2020 at 2:11 PM

## 2020-08-22 NOTE — Interval H&P Note (Signed)
Patient returned to PACU area as she has not voided. She is also is waiting for her husband, who is her ride and  has her clothes and phone.   1700 Patient asked me to call her husband stating he should be here by now. His phone went straight to voice mail.  1720 Patient very upset with her husband as she states it only take 15 minutes to get here from their home. Phone provided to her and she called her daughter to come and get her. Patient ambulated to BR and voided 200 ml.  1800 Patient discharged in patient gown. Assisted to car with daughter.

## 2020-08-22 NOTE — Anesthesia Post-Procedure Evaluation (Signed)
Procedure(s):  LEFT L4/5 LAMINECTOMY DISCECTOMY/TUBULAR RETRACTOR/C-ARM/NUVASIVE.    general    Anesthesia Post Evaluation      Multimodal analgesia: multimodal analgesia used between 6 hours prior to anesthesia start to PACU discharge  Patient location during evaluation: bedside  Patient participation: complete - patient participated  Level of consciousness: awake  Pain score: 0  Pain management: adequate  Airway patency: patent  Anesthetic complications: no  Cardiovascular status: stable  Respiratory status: acceptable  Hydration status: acceptable  Post anesthesia nausea and vomiting:  controlled  Final Post Anesthesia Temperature Assessment:  Normothermia (36.0-37.5 degrees C)      INITIAL Post-op Vital signs:   Vitals Value Taken Time   BP 140/86 08/22/20 1519   Temp 36.2 ??C (97.2 ??F) 08/22/20 1519   Pulse 95 08/22/20 1519   Resp 14 08/22/20 1519   SpO2 92 % 08/22/20 1519

## 2020-08-24 ENCOUNTER — Inpatient Hospital Stay
Admit: 2020-08-24 | Discharge: 2020-08-24 | Disposition: A | Discharge status: Home / Self Care | Payer: PRIVATE HEALTH INSURANCE | Attending: Emergency Medicine

## 2020-08-24 ENCOUNTER — Observation Stay: Admit: 2020-08-24 | Payer: PRIVATE HEALTH INSURANCE | Primary: Internal Medicine

## 2020-08-24 DIAGNOSIS — S064X9A Epidural hemorrhage with loss of consciousness of unspecified duration, initial encounter: Secondary | ICD-10-CM

## 2020-08-24 LAB — CBC
Hematocrit: 34.5 % — ABNORMAL LOW (ref 35.0–45.0)
Hemoglobin: 10.8 g/dL — ABNORMAL LOW (ref 12.0–16.0)
MCH: 24.5 PG (ref 24.0–34.0)
MCHC: 31.3 g/dL (ref 31.0–37.0)
MCV: 78.4 FL (ref 78.0–100.0)
MPV: 9 FL — ABNORMAL LOW (ref 9.2–11.8)
NRBC Absolute: 0 10*3/uL (ref 0.00–0.01)
Nucleated RBCs: 0 PER 100 WBC
Platelets: 256 10*3/uL (ref 135–420)
RBC: 4.4 M/uL (ref 4.20–5.30)
RDW: 13.9 % (ref 11.6–14.5)
WBC: 8 10*3/uL (ref 4.6–13.2)

## 2020-08-24 LAB — COMPREHENSIVE METABOLIC PANEL
ALT: 24 U/L (ref 13–56)
AST: 17 U/L (ref 10–38)
Albumin/Globulin Ratio: 0.8 (ref 0.8–1.7)
Albumin: 3.1 g/dL — ABNORMAL LOW (ref 3.4–5.0)
Alkaline Phosphatase: 77 U/L (ref 45–117)
Anion Gap: 4 mmol/L (ref 3.0–18)
BUN: 13 MG/DL (ref 7.0–18)
Bun/Cre Ratio: 17 (ref 12–20)
CO2: 28 mmol/L (ref 21–32)
Calcium: 8.5 MG/DL (ref 8.5–10.1)
Chloride: 109 mmol/L (ref 100–111)
Creatinine: 0.77 MG/DL (ref 0.6–1.3)
EGFR IF NonAfrican American: >60 mL/min/{1.73_m2} (ref >60)
GFR African American: >60 mL/min/{1.73_m2} (ref >60)
Globulin: 4.1 g/dL — ABNORMAL HIGH (ref 2.0–4.0)
Glucose: 114 mg/dL — ABNORMAL HIGH (ref 74–99)
Potassium: 3.6 mmol/L (ref 3.5–5.5)
Sodium: 141 mmol/L (ref 136–145)
Total Bilirubin: 0.5 MG/DL (ref 0.2–1.0)
Total Protein: 7.2 g/dL (ref 6.4–8.2)

## 2020-08-24 LAB — CBC W/O DIFF
ABSOLUTE NRBC: 0 10*3/uL (ref 0.00–0.01)
HCT: 34.5 % — ABNORMAL LOW (ref 35.0–45.0)
HGB: 10.8 g/dL — ABNORMAL LOW (ref 12.0–16.0)
MCH: 24.5 PG (ref 24.0–34.0)
MCHC: 31.3 g/dL (ref 31.0–37.0)
MCV: 78.4 FL (ref 78.0–100.0)
MPV: 9 FL — ABNORMAL LOW (ref 9.2–11.8)
NRBC: 0 PER 100 WBC
PLATELET: 256 10*3/uL (ref 135–420)
RBC: 4.4 M/uL (ref 4.20–5.30)
RDW: 13.9 % (ref 11.6–14.5)
WBC: 8 10*3/uL (ref 4.6–13.2)

## 2020-08-24 LAB — METABOLIC PANEL, COMPREHENSIVE
A-G Ratio: 0.8 (ref 0.8–1.7)
ALT (SGPT): 24 U/L (ref 13–56)
AST (SGOT): 17 U/L (ref 10–38)
Albumin: 3.1 g/dL — ABNORMAL LOW (ref 3.4–5.0)
Alk. phosphatase: 77 U/L (ref 45–117)
Anion gap: 4 mmol/L (ref 3.0–18)
BUN/Creatinine ratio: 17 (ref 12–20)
BUN: 13 MG/DL (ref 7.0–18)
Bilirubin, total: 0.5 MG/DL (ref 0.2–1.0)
CO2: 28 mmol/L (ref 21–32)
Calcium: 8.5 MG/DL (ref 8.5–10.1)
Chloride: 109 mmol/L (ref 100–111)
Creatinine: 0.77 MG/DL (ref 0.6–1.3)
GFR est AA: >60 mL/min/{1.73_m2} (ref >60)
GFR est non-AA: >60 mL/min/{1.73_m2} (ref >60)
Globulin: 4.1 g/dL — ABNORMAL HIGH (ref 2.0–4.0)
Glucose: 114 mg/dL — ABNORMAL HIGH (ref 74–99)
Potassium: 3.6 mmol/L (ref 3.5–5.5)
Protein, total: 7.2 g/dL (ref 6.4–8.2)
Sodium: 141 mmol/L (ref 136–145)

## 2020-08-24 MED ORDER — DEXTROSE 5%-1/2 NORMAL SALINE IV
INTRAVENOUS | Status: DC
Start: 2020-08-24 — End: 2020-08-26
  Administered 2020-08-24 – 2020-08-26 (×3): via INTRAVENOUS

## 2020-08-24 MED ORDER — PREGABALIN 75 MG CAP
75 mg | Freq: Two times a day (BID) | ORAL | Status: DC
Start: 2020-08-24 — End: 2020-08-26
  Administered 2020-08-24 – 2020-08-26 (×5): via ORAL

## 2020-08-24 MED ORDER — DEXAMETHASONE SODIUM PHOSPHATE 4 MG/ML IJ SOLN
4 mg/mL | Freq: Four times a day (QID) | INTRAMUSCULAR | Status: AC
Start: 2020-08-24 — End: 2020-08-24
  Administered 2020-08-24 – 2020-08-25 (×3): via INTRAVENOUS

## 2020-08-24 MED ORDER — HYDROMORPHONE 4 MG TAB
4 mg | ORAL | Status: DC | PRN
Start: 2020-08-24 — End: 2020-08-26

## 2020-08-24 MED ORDER — SODIUM CHLORIDE 0.9 % IV
4 mg/mL | Freq: Four times a day (QID) | INTRAVENOUS | Status: DC
Start: 2020-08-24 — End: 2020-08-24

## 2020-08-24 MED ORDER — GADOBENATE DIMEGLUMINE 529 MG/ML (0.1 MMOL/0.2 ML) IV
529 mg/mL (0.1mmol/0.2mL) | Freq: Once | INTRAVENOUS | Status: AC
Start: 2020-08-24 — End: 2020-08-24
  Administered 2020-08-24: 13:00:00 via INTRAVENOUS

## 2020-08-24 MED ORDER — AMLODIPINE 5 MG TAB
5 mg | Freq: Every day | ORAL | Status: DC
Start: 2020-08-24 — End: 2020-08-26
  Administered 2020-08-24 – 2020-08-26 (×3): via ORAL

## 2020-08-24 MED ORDER — HYDROMORPHONE 1 MG/ML INJECTION SOLUTION
1 mg/mL | INTRAMUSCULAR | Status: DC | PRN
Start: 2020-08-24 — End: 2020-08-25
  Administered 2020-08-24 – 2020-08-25 (×6): via INTRAVENOUS

## 2020-08-24 MED ORDER — IPRATROPIUM-ALBUTEROL 2.5 MG-0.5 MG/3 ML NEB SOLUTION
2.5 mg-0.5 mg/3 ml | Freq: Four times a day (QID) | RESPIRATORY_TRACT | Status: DC | PRN
Start: 2020-08-24 — End: 2020-08-26

## 2020-08-24 MED ORDER — CETIRIZINE 10 MG TAB
10 mg | Freq: Every day | ORAL | Status: DC
Start: 2020-08-24 — End: 2020-08-26
  Administered 2020-08-24 – 2020-08-26 (×3): via ORAL

## 2020-08-24 MED ORDER — BACLOFEN 10 MG TAB
10 mg | Freq: Three times a day (TID) | ORAL | Status: DC
Start: 2020-08-24 — End: 2020-08-26
  Administered 2020-08-24 – 2020-08-26 (×7): via ORAL

## 2020-08-24 MED ORDER — LORAZEPAM 2 MG/ML SYRINGE
2 mg/mL | Freq: Once | INTRAMUSCULAR | Status: AC
Start: 2020-08-24 — End: 2020-08-24
  Administered 2020-08-24: 15:00:00 via INTRAVENOUS

## 2020-08-24 MED FILL — HYDROMORPHONE (PF) 1 MG/ML IJ SOLN: 1 mg/mL | INTRAMUSCULAR | Qty: 1

## 2020-08-24 MED FILL — DEXAMETHASONE SODIUM PHOSPHATE 4 MG/ML IJ SOLN: 4 mg/mL | INTRAMUSCULAR | Qty: 3

## 2020-08-24 MED FILL — PREGABALIN 75 MG CAP: 75 mg | ORAL | Qty: 2

## 2020-08-24 MED FILL — BACLOFEN 10 MG TAB: 10 mg | ORAL | Qty: 1

## 2020-08-24 MED FILL — MULTIHANCE 529 MG/ML (0.1 MMOL/0.2 ML) INTRAVENOUS SOLUTION: 529 mg/mL (0.1mmol/0.2mL) | INTRAVENOUS | Qty: 20

## 2020-08-24 MED FILL — NORVASC 5 MG TABLET: 5 mg | ORAL | Qty: 1

## 2020-08-24 MED FILL — DEXTROSE 5%-1/2 NORMAL SALINE IV: INTRAVENOUS | Qty: 1000

## 2020-08-24 MED FILL — CETIRIZINE 10 MG TAB: 10 mg | ORAL | Qty: 1

## 2020-08-24 NOTE — H&P (Signed)
Admission History and Physical       Patient: [name] [name]               Sex: female          DOA: @INPADMDT @       Date of Birth:  [birth year]      Age:  55 y.o.                 Subjective:     Patient is a 55 y.o.  female who presents with history of Laminectomy Thursday at [facility].  Discharged home with only incisional pain. Did well for the next 24 hours and then began to have severe pain/ cramping in left leg.  Pre op sciatica resolved.  Pain increased over night and patient contacted me at 430AM. Met her in ER.     Severe pain, cannot ambulate.    PE wound benign, non-tender, no warmth erythema, induration.  Normal motor ehl ta h q  No sensory deficit  Cramping in leg- ?SLR    Patient Active Problem List    Diagnosis Date Noted   ??? Radiculopathy due to lumbar intervertebral disc disorder 08/24/2020   ??? Mild persistent asthma in adult without complication 74/25/9563   ??? Perennial allergic rhinitis with seasonal variation 12/06/2013   ??? Episodic tension-type headache, not intractable 12/06/2013   ??? Obesity, Class III, BMI 40-49.9 (morbid obesity) ([place]) 12/06/2013   ??? Cervical polyp 10/10/2012     Past Medical History:   Diagnosis Date   ??? Asthma    ??? COVID-19 11/2018, 02/2020   ??? H/O seasonal allergies    ??? Headache    ??? Hx of cholecystectomy 1999   ??? Hypertension    ??? Morbid obesity ([place])    ??? Sleep apnea 07/2020    no cpap      Past Surgical History:   Procedure Laterality Date   ??? COLONOSCOPY N/A 01/25/2020    COLONOSCOPY performed by Jonell Cluck, MD at [facility] ENDOSCOPY   ??? HX APPENDECTOMY  2006   ??? [place]   ??? [place]   ??? HX HYSTERECTOMY      uterus removed   ??? HX HYSTERECTOMY  2012      Prior to Admission medications    Medication Sig Start Date End Date Taking? Authorizing Provider   oxyCODONE IR (Roxicodone) 5 mg immediate release tablet Take 1 Tablet by mouth every six (6) hours as needed for Pain for up to 7 days. Max Daily Amount: 20 mg. 08/22/20 08/29/20   Candi Leash, MD   pregabalin (Lyrica) 150 mg capsule Take 1 Capsule by mouth two (2) times a day. Max Daily Amount: 300 mg. 06/06/20   Creola Corn, MD   amLODIPine (NORVASC) 5 mg tablet Take 5 mg by mouth daily.    Provider, Historical   multivitamin (ONE A DAY) tablet Take 1 Tab by mouth daily.    Provider, Historical   albuterol (PROAIR HFA) 90 mcg/actuation inhaler Take 2 Puffs by inhalation every six (6) hours as needed for Wheezing. Indications: ACUTE ASTHMA ATTACK 12/06/13   Nolberto Hanlon, MD   cetirizine (ZYRTEC) 10 mg tablet Take 1 Tab by mouth daily. Indications: PERENNIAL ALLERGIC RHINITIS 12/06/13   Nolberto Hanlon, MD   fluticasone [facility]) 50 mcg/actuation nasal spray 2 Sprays by Both Nostrils route daily. Indications: ALLERGIC RHINITIS 12/06/13   Nolberto Hanlon, MD  Current Facility-Administered Medications   Medication Dose Route Frequency   ??? albuterol-ipratropium (DUO-NEB) 2.5 MG-0.5 MG/3 ML  3 mL Nebulization Q6H PRN   ??? amLODIPine (NORVASC) tablet 5 mg  5 mg Oral DAILY   ??? cetirizine (ZYRTEC) tablet 10 mg  10 mg Oral DAILY   ??? pregabalin (LYRICA) capsule 150 mg  150 mg Oral BID   ??? dexamethasone (DECADRON) 10 mg in 0.9% sodium chloride 50 mL IVPB  10 mg IntraVENous Q6H   ??? HYDROmorphone (DILAUDID) tablet 4 mg  4 mg Oral Q4H PRN   ??? HYDROmorphone (DILAUDID) injection 1 mg  1 mg IntraVENous Q3H PRN   ??? baclofen (LIORESAL) tablet 10 mg  10 mg Oral TID   ??? dextrose 5 % - 0.45% NaCl infusion  50 mL/hr IntraVENous CONTINUOUS     Current Outpatient Medications   Medication Sig   ??? oxyCODONE IR (Roxicodone) 5 mg immediate release tablet Take 1 Tablet by mouth every six (6) hours as needed for Pain for up to 7 days. Max Daily Amount: 20 mg.   ??? pregabalin (Lyrica) 150 mg capsule Take 1 Capsule by mouth two (2) times a day. Max Daily Amount: 300 mg.   ??? amLODIPine (NORVASC) 5 mg tablet Take 5 mg by mouth daily.   ??? multivitamin (ONE A DAY) tablet Take 1 Tab by mouth daily.   ??? albuterol (PROAIR  HFA) 90 mcg/actuation inhaler Take 2 Puffs by inhalation every six (6) hours as needed for Wheezing. Indications: ACUTE ASTHMA ATTACK   ??? cetirizine (ZYRTEC) 10 mg tablet Take 1 Tab by mouth daily. Indications: PERENNIAL ALLERGIC RHINITIS   ??? fluticasone (FLONASE) 50 mcg/actuation nasal spray 2 Sprays by Both Nostrils route daily. Indications: ALLERGIC RHINITIS      Allergies   Allergen Reactions   ??? Latex Hives   ??? Pollen Extracts Cough, Sneezing and Other (comments)     Irritates asthma   ??? Tomato Hives      Social History     Tobacco Use   ??? Smoking status: Never Smoker   ??? Smokeless tobacco: Never Used   Substance Use Topics   ??? Alcohol use: Not Currently     Alcohol/week: 1.0 standard drink     Types: 1 Glasses of wine per week      Family History   Problem Relation Age of Onset   ??? Heart Disease Mother    ??? Hypertension Mother    ??? Diabetes Maternal Aunt    ??? Cancer Maternal Grandmother         colon        Review of Systems  Denies chills, fever, bowel or bladder dysfunction, unexplained weight loss/weight gain, chest pain, SOB or anxiety    Objective:     Patient Vitals for the past 8 hrs:   BP Temp Pulse Resp SpO2 Weight   08/24/20 0543 (!) 147/90 100.2 ??F (37.9 ??C) 96 18 100 % 129.3 kg (285 lb 1.6 oz)         Physical Exam:  Pertinent items are noted in HPI.                Assessment:   Severe    Patient Active Problem List    Diagnosis Date Noted   ??? Radiculopathy due to lumbar intervertebral disc disorder 08/24/2020   ??? Mild persistent asthma in adult without complication 37/12/6267   ??? Perennial allergic rhinitis with seasonal variation 12/06/2013   ??? Episodic tension-type headache, not intractable  12/06/2013   ??? Obesity, Class III, BMI 40-49.9 (morbid obesity) ([place]) 12/06/2013   ??? Cervical polyp 10/10/2012         Plan:   Patient with severe flair of pain cramping in leg 24 hours sp laminectomy for severe pain in same.      Concerns are neuritis, hematoma, disc recurrence. Too early generally for  infection. No symptoms of CSF leak.     Will obtain labs  MRI LS spine- stat  IV steroid  Pain control  Admit for observation   NPO until MRI reviewed

## 2020-08-24 NOTE — ED Provider Notes (Signed)
EMERGENCY DEPARTMENT HISTORY AND PHYSICAL EXAM      Date: 08/24/2020  Patient Name: [name] [name]    History of Presenting Illness     Chief Complaint   Patient presents with   ??? Post-Op Problem       History (Context): [name] [name] is a 55 y.o. female with a past medical history significant for obesity, asthma, hypertension comes into the ED today due to back pain radiating down the left lower extremity.  Patient states symptoms have been ongoing since her lumbar laminectomy on the second.  Patient states symptoms have not worsened or improved since onset.  She has been taking multiple medications for treatment of her symptoms without significant improvement at home prior to arrival.  Patient denies any fever, chills, chest pain, dyspnea, abdominal pain, nausea, vomiting, change in bowel or bladder habitus, numbness, tingling, or weakness.  Patient does admit to worsening of her symptoms with movement of the lower extremity.  She describes her symptoms as severe in quality.      PCP: Marye Round, MD    Current Facility-Administered Medications   Medication Dose Route Frequency Provider Last Rate Last Admin   ??? albuterol-ipratropium (DUO-NEB) 2.5 MG-0.5 MG/3 ML  3 mL Nebulization Q6H PRN Candi Leash, MD       ??? amLODIPine (NORVASC) tablet 5 mg  5 mg Oral DAILY Candi Leash, MD       ??? cetirizine (ZYRTEC) tablet 10 mg  10 mg Oral DAILY Candi Leash, MD       ??? pregabalin (LYRICA) capsule 150 mg  150 mg Oral BID Candi Leash, MD       ??? HYDROmorphone (DILAUDID) tablet 4 mg  4 mg Oral Q4H PRN Candi Leash, MD       ??? HYDROmorphone (DILAUDID) injection 1 mg  1 mg IntraVENous Q3H PRN Candi Leash, MD   1 mg at 08/24/20 0716   ??? baclofen (LIORESAL) tablet 10 mg  10 mg Oral TID Candi Leash, MD       ??? dextrose 5 % - 0.45% NaCl infusion  50 mL/hr IntraVENous CONTINUOUS Candi Leash, MD       ??? dexamethasone (DECADRON) 4 mg/mL injection 10 mg  10 mg IntraVENous Q6H Candi Leash, MD       ???  LORazepam (ATIVAN) 2 mg/mL injection 1 mg  1 mg IntraVENous ONCE Candi Leash, MD         Current Outpatient Medications   Medication Sig Dispense Refill   ??? oxyCODONE IR (Roxicodone) 5 mg immediate release tablet Take 1 Tablet by mouth every six (6) hours as needed for Pain for up to 7 days. Max Daily Amount: 20 mg. 28 Tablet 0   ??? pregabalin (Lyrica) 150 mg capsule Take 1 Capsule by mouth two (2) times a day. Max Daily Amount: 300 mg. 60 Capsule 2   ??? amLODIPine (NORVASC) 5 mg tablet Take 5 mg by mouth daily.     ??? multivitamin (ONE A DAY) tablet Take 1 Tab by mouth daily.     ??? albuterol (PROAIR HFA) 90 mcg/actuation inhaler Take 2 Puffs by inhalation every six (6) hours as needed for Wheezing. Indications: ACUTE ASTHMA ATTACK 1 Inhaler 5   ??? cetirizine (ZYRTEC) 10 mg tablet Take 1 Tab by mouth daily. Indications: PERENNIAL ALLERGIC RHINITIS 30 Tab 5   ??? fluticasone (FLONASE) 50 mcg/actuation nasal spray 2 Sprays by Both Nostrils route daily.  Indications: ALLERGIC RHINITIS 1 Bottle 5       Past History     Past Medical History:   Past Medical History:   Diagnosis Date   ??? Asthma    ??? COVID-19 11/2018, 02/2020   ??? H/O seasonal allergies    ??? Headache    ??? Hx of cholecystectomy 1999   ??? Hypertension    ??? Morbid obesity ([place])    ??? Sleep apnea 07/2020    no cpap       Past Surgical History:  Past Surgical History:   Procedure Laterality Date   ??? COLONOSCOPY N/A 01/25/2020    COLONOS[facility] performed by Jonell Cluck, MD at [facility] ENDOSCOPY   ??? HX APPENDECTOMY  2006   ??? [place]   ??? [place]   ??? HX HYSTERECTOMY      uterus removed   ??? HX HYSTERECTOMY  2012       Family History:  Family History   Problem Relation Age of Onset   ??? Heart Disease Mother    ??? Hypertension Mother    ??? Diabetes Maternal Aunt    ??? Cancer Maternal Grandmother         colon       Social History:   Social History     Tobacco Use   ??? Smoking status: Never Smoker   ??? Smokeless tobacco: Never Used   Vaping  Use   ??? Vaping Use: Never used   Substance Use Topics   ??? Alcohol use: Not Currently     Alcohol/week: 1.0 standard drink     Types: 1 Glasses of wine per week   ??? Drug use: No       Allergies:  Allergies   Allergen Reactions   ??? Latex Hives   ??? Pollen Extracts Cough, Sneezing and Other (comments)     Irritates asthma   ??? Tomato Hives       PMH, PSH, family history, social history, allergies reviewed with the patient with significant items noted above.  Review of Systems   Review of Systems   Constitutional: Negative for chills and fever.   HENT: Negative for sore throat.    Eyes: Negative for visual disturbance.   Respiratory: Negative for shortness of breath.    Cardiovascular: Negative for chest pain.   Gastrointestinal: Negative for abdominal pain, nausea and vomiting.   Genitourinary: Negative for difficulty urinating.   Musculoskeletal: Positive for back pain and myalgias.   Skin: Negative for rash.   Neurological: Negative for headaches.       Physical Exam     Vitals:    08/24/20 0543   BP: (!) 147/90   Pulse: 96   Resp: 18   Temp: 100.2 ??F (37.9 ??C)   SpO2: 100%   Weight: 129.3 kg (285 lb 1.6 oz)       Physical Exam  Vitals and nursing note reviewed.   Constitutional:       General: She is not in acute distress.     Appearance: Normal appearance. She is not ill-appearing or toxic-appearing.   HENT:      Head: Normocephalic and atraumatic.      Mouth/Throat:      Mouth: Mucous membranes are moist.   Eyes:      General: No scleral icterus.     Conjunctiva/sclera: Conjunctivae normal.   Cardiovascular:      Rate and Rhythm: Normal rate and regular  rhythm.      Comments: Normal peripheral perfusion  Pulmonary:      Effort: Pulmonary effort is normal. No respiratory distress.   Abdominal:      General: There is no distension.      Palpations: Abdomen is soft.      Tenderness: There is no abdominal tenderness.   Musculoskeletal:         General: No deformity. Normal range of motion.      Cervical back: Normal  range of motion and neck supple.   Skin:     General: Skin is warm and dry.      Findings: No rash.   Neurological:      General: No focal deficit present.      Mental Status: She is alert and oriented to person, place, and time. Mental status is at baseline.      Sensory: No sensory deficit.      Motor: No weakness.   Psychiatric:         Mood and Affect: Mood normal.         Thought Content: Thought content normal.         Diagnostic Study Results     Labs -     Recent Results (from the past 12 hour(s))   CBC W/O DIFF    Collection Time: 08/24/20  6:16 AM   Result Value Ref Range    WBC 8.0 4.6 - 13.2 K/uL    RBC 4.40 4.20 - 5.30 M/uL    HGB 10.8 (L) 12.0 - 16.0 g/dL    HCT 34.5 (L) 35.0 - 45.0 %    MCV 78.4 78.0 - 100.0 FL    MCH 24.5 24.0 - 34.0 PG    MCHC 31.3 31.0 - 37.0 g/dL    RDW 13.9 11.6 - 14.5 %    PLATELET 256 135 - 420 K/uL    MPV 9.0 (L) 9.2 - 11.8 FL    NRBC 0.0 0 PER 100 WBC    ABSOLUTE NRBC 0.00 0.00 - 1.61 K/uL   METABOLIC PANEL, COMPREHENSIVE    Collection Time: 08/24/20  6:16 AM   Result Value Ref Range    Sodium 141 136 - 145 mmol/L    Potassium 3.6 3.5 - 5.5 mmol/L    Chloride 109 100 - 111 mmol/L    CO2 28 21 - 32 mmol/L    Anion gap 4 3.0 - 18 mmol/L    Glucose 114 (H) 74 - 99 mg/dL    BUN 13 7.0 - 18 MG/DL    Creatinine 0.77 0.6 - 1.3 MG/DL    BUN/Creatinine ratio 17 12 - 20      GFR est AA >60 >60 ml/min/1.22m    GFR est non-AA >60 >60 ml/min/1.767m   Calcium 8.5 8.5 - 10.1 MG/DL    Bilirubin, total 0.5 0.2 - 1.0 MG/DL    ALT (SGPT) 24 13 - 56 U/L    AST (SGOT) 17 10 - 38 U/L    Alk. phosphatase 77 45 - 117 U/L    Protein, total 7.2 6.4 - 8.2 g/dL    Albumin 3.1 (L) 3.4 - 5.0 g/dL    Globulin 4.1 (H) 2.0 - 4.0 g/dL    A-G Ratio 0.8 0.8 - 1.7        Labs Reviewed   CBC W/O DIFF - Abnormal; Notable for the following components:       Result Value    HGB 10.8 (*)  HCT 34.5 (*)     MPV 9.0 (*)     All other components within normal limits   METABOLIC PANEL, COMPREHENSIVE - Abnormal;  Notable for the following components:    Glucose 114 (*)     Albumin 3.1 (*)     Globulin 4.1 (*)     All other components within normal limits       Radiologic Studies -   MRI LUMB SPINE W WO CONT    (Results Pending)     CT Results  (Last 48 hours)    None        CXR Results  (Last 48 hours)    None          The laboratory results, imaging results, and other diagnostic exams were reviewed in the EMR.    Medical Decision Making   I am the first provider for this patient.    I reviewed the vital signs, available nursing notes, past medical history, past surgical history, family history and social history.    Vital Signs-Reviewed the patient's vital signs.     Records Reviewed: Personally, on initial evaluation    MDM:   [name] [name] presents with complaint of back pain and left lower extremity pain  DDX includes but is not limited to: Radiculopathy, postop complication, seroma    Patient overall well-appearing, no acute distress, vital signs grossly within normal limits.  Will obtain lab work and imaging for further evaluation of patients complaint.  Spoke with Dr. Ma Rings who will admit patient for observation for further evaluation of her symptoms.  Will continue to monitor and evaluate patient while in the ED.      Orders as below:  Orders Placed This Encounter   ??? MRI LUMB SPINE W WO CONT   ??? CBC W/O DIFF   ??? METABOLIC PANEL, COMPREHENSIVE   ??? DIET NPO   ??? VITAL SIGNS PER UNIT ROUTINE   ??? albuterol-ipratropium (DUO-NEB) 2.5 MG-0.5 MG/3 ML   ??? amLODIPine (NORVASC) tablet 5 mg   ??? cetirizine (ZYRTEC) tablet 10 mg   ??? pregabalin (LYRICA) capsule 150 mg   ??? DISCONTD: dexamethasone (DECADRON) 10 mg in 0.9% sodium chloride 50 mL IVPB   ??? HYDROmorphone (DILAUDID) tablet 4 mg   ??? HYDROmorphone (DILAUDID) injection 1 mg   ??? baclofen (LIORESAL) tablet 10 mg   ??? dextrose 5 % - 0.45% NaCl infusion   ??? dexamethasone (DECADRON) 4 mg/mL injection 10 mg   ??? LORazepam (ATIVAN) 2 mg/mL injection 1 mg   ??? gadobenate  dimeglumine (MULTIHANCE) 529 mg/mL (0.51mol/0.2mL) contrast injection 20 mL   ??? INITIAL PHYSICIAN ORDER: OBSERVATION/OUTPATIENT IN A BED OBSERVATION; Orthopedics; severe pain        ED Course:            Procedures:  Procedures        Diagnosis and Disposition     CLINICAL IMPRESSION:  No diagnosis found.  Current Discharge Medication List          Disposition: Admit    Patient condition at time of disposition: Stable      Dragon Disclaimer     Please note that this dictation was completed with Dragon, the computer voice recognition software.  Quite often unanticipated grammatical, syntax, homophones, and other interpretive errors are inadvertently transcribed by the computer software.  Please disregard these errors.  Please excuse any errors that have escaped final proofreading.      DSigna [email or web address].

## 2020-08-24 NOTE — ED Notes (Signed)
Patient complaining of cramping in buttock and down leg.  Dr Carney Living in to see pt.   States she had surgery on Thursday morning

## 2020-08-24 NOTE — Op Note (Signed)
[facility]  OPERATIVE REPORT    Name:  [name] [name], [name] [name]  MR#:   [id number]  DOB:  1965/05/03  ACCOUNT #:  [id number]  DATE OF SERVICE:  08/25/2020    PREOPERATIVE DIAGNOSIS:  Epidural hematoma.    POSTOPERATIVE DIAGNOSIS:  Epidural hematoma.    PROCEDURES PERFORMED:  Evacuation of epidural hematoma; revise laminectomy, L4-L5.    SURGEON:  Pamalee Leyden, MD.    ASSISTANT:  None.    ANESTHESIA:  General endotracheal.    COMPLICATIONS:  None.    SPECIMENS REMOVED:  None.    IMPLANTS:  None.    ESTIMATED BLOOD LOSS:  None.    FINDINGS:  The patient had an epidural hematoma.  There was no organized clot.  The disk space was explored and there was no recurrent disk herniation or continuing stenosis there.  We explored the canal proximally and distally with a catheter to see if there was any residual clot not drained and there did not appear to be any.    DESCRIPTION OF PROCEDURE:  Following induction of general endotracheal anesthesia, the patient was turned into prone position on a spinal frame.  The patient was prepped and draped in the usual fashion.  Previous incision was opened.  Hematoma was found.  It was suctioned free.  Deep closure was opened.  Hematoma found and suctioned free.  The split-tubular retractor was placed into the 1-inch long incision.  With 100 mm blade, it was taken down to the laminotomy site.  There was no active bleeding noticed.  Previously placed Gelfoam was removed.  Residual hematoma was removed from the area.  Palpated proximally and distally with a [place] without further finding.  I took a 8 French red rubber catheter and guided it gently proximally and distally.  It did not disclose any further clot or hematoma.  I irrigated the wound with Irrisept.  The Irrisept was clear and translucent.  One could see the length  wound filled down to the incision without a hint of blood tinge.  I then re-explored the area, removed some detritus ligamentum from around the laminectomy  site, mobilized the nerve roots, felt around the lateral recesses, retracted the neural elements, instrumented the disk space, and found no loose or continuing disk material causing nerve root compression.  I then re-irrigated the wound.  It continued to be clear without any hint of bleeding.  Vancomycin powder was instilled.  I placed a deep drain.  The incision was closed in layers and the skin closed with subcuticular suture and Dermabond.  A sterile occlusive dressing was placed upon the wound.  All counts were correct.      Pamalee Leyden, MD      [id number]  D:  08/25/2020 8:43  T:  08/25/2020 11:04  JOB #:  [id number]

## 2020-08-24 NOTE — Progress Notes (Signed)
Pain much improved (9/10 to 3/10.  No numbness weakness  No bowel or bladder dysfunction  MRI reviewed with radiology  Shows epidural hematoma with canal compromise    Discussed options with patient  Observation steroids and pain meds  Vs  Surgical exploration  Given morbid obesity I used a MIS system for decompression not in house currently  Would need to significantly enlarge surgery to explore    RBCA discussed    Patient has been stable neuro and pain is controlled    WIll observe    If pain increases or if she develops any neuro then to or for evacuation  Have requested MIS system be brought in just in case  Will allow patient to eat  NPO after MN

## 2020-08-24 NOTE — Progress Notes (Signed)
MRI Screening form needs to be filled out and faxed to [phone] BEFORE MRI can be scheduled. If unable to obtain information from pt, [facility] needs to be contacted. If pt is claustro or will need pain meds, please have ordered in advance in order to facilitate exam.

## 2020-08-24 NOTE — ED Notes (Signed)
 TRANSFER - OUT REPORT:    Verbal report given to Leigh(name) on [name] [name]  being transferred to [facility] south(unit) for routine progression of care       Report consisted of patient's Situation, Background, Assessment and   Recommendations(SBAR).     Information from the following report(s) SBAR, ED Summary and MAR was reviewed with the receiving nurse.    Lines:   Peripheral IV 08/24/20 Right Forearm (Active)   Site Assessment Clean, dry, & intact 08/24/20 0614   Phlebitis Assessment 0 08/24/20 0614   Infiltration Assessment 0 08/24/20 0614   Dressing Status Clean, dry, & intact;Occlusive 08/24/20 0614   Dressing Type Transparent 08/24/20 0614   Hub Color/Line Status Pink 08/24/20 0614   Action Taken Dressing reinforced 08/24/20 0614   Alcohol Cap Used No 08/24/20 9385        Opportunity for questions and clarification was provided.      Patient transported with:   [employer]

## 2020-08-25 MED ORDER — GLYCOPYRROLATE 0.2 MG/ML IJ SOLN
0.2 mg/mL | INTRAMUSCULAR | Status: DC | PRN
Start: 2020-08-25 — End: 2020-08-25
  Administered 2020-08-25: 13:00:00 via INTRAVENOUS

## 2020-08-25 MED ORDER — ONDANSETRON (PF) 4 MG/2 ML INJECTION
4 mg/2 mL | INTRAMUSCULAR | Status: DC | PRN
Start: 2020-08-25 — End: 2020-08-25
  Administered 2020-08-25: 12:00:00 via INTRAVENOUS

## 2020-08-25 MED ORDER — NALOXONE 0.4 MG/ML INJECTION
0.4 mg/mL | INTRAMUSCULAR | Status: DC | PRN
Start: 2020-08-25 — End: 2020-08-25

## 2020-08-25 MED ORDER — ACETAMINOPHEN 500 MG TAB
500 mg | Freq: Four times a day (QID) | ORAL | Status: DC
Start: 2020-08-25 — End: 2020-08-26
  Administered 2020-08-25 – 2020-08-26 (×4): via ORAL

## 2020-08-25 MED ORDER — LIDOCAINE (PF) 20 MG/ML (2 %) IJ SOLN
20 mg/mL (2 %) | INTRAMUSCULAR | Status: DC | PRN
Start: 2020-08-25 — End: 2020-08-25
  Administered 2020-08-25: 12:00:00 via INTRAVENOUS

## 2020-08-25 MED ORDER — SUCCINYLCHOLINE CHLORIDE 20 MG/ML INJECTION
20 mg/mL | INTRAMUSCULAR | Status: DC | PRN
Start: 2020-08-25 — End: 2020-08-25
  Administered 2020-08-25: 12:00:00 via INTRAVENOUS

## 2020-08-25 MED ORDER — LIDOCAINE (PF) 10 MG/ML (1 %) IJ SOLN
10 mg/mL (1 %) | INTRAMUSCULAR | Status: DC | PRN
Start: 2020-08-25 — End: 2020-08-25

## 2020-08-25 MED ORDER — DOCUSATE SODIUM 100 MG CAP
100 mg | Freq: Two times a day (BID) | ORAL | Status: DC
Start: 2020-08-25 — End: 2020-08-26
  Administered 2020-08-25 – 2020-08-26 (×3): via ORAL

## 2020-08-25 MED ORDER — SODIUM CHLORIDE 0.9 % IJ SYRG
INTRAMUSCULAR | Status: DC | PRN
Start: 2020-08-25 — End: 2020-08-26

## 2020-08-25 MED ORDER — GELATIN ADSORBABLE 100 CM TOPICAL SPONGE COMPRESSED
100 cm | CUTANEOUS | Status: AC
Start: 2020-08-25 — End: ?

## 2020-08-25 MED ORDER — LACTATED RINGERS IV
INTRAVENOUS | Status: DC | PRN
Start: 2020-08-25 — End: 2020-08-25
  Administered 2020-08-25: 12:00:00 via INTRAVENOUS

## 2020-08-25 MED ORDER — NEOSTIGMINE METHYLSULFATE 3 MG/3 ML (1 MG/ML) IV SYRINGE
3 mg/ mL (1 mg/mL) | INTRAVENOUS | Status: DC | PRN
Start: 2020-08-25 — End: 2020-08-25
  Administered 2020-08-25: 13:00:00 via INTRAVENOUS

## 2020-08-25 MED ORDER — LACTATED RINGERS IV
INTRAVENOUS | Status: DC
Start: 2020-08-25 — End: 2020-08-25

## 2020-08-25 MED ORDER — FAMOTIDINE 20 MG TAB
20 mg | Freq: Two times a day (BID) | ORAL | Status: DC
Start: 2020-08-25 — End: 2020-08-26
  Administered 2020-08-25 – 2020-08-26 (×2): via ORAL

## 2020-08-25 MED ORDER — NALOXONE 0.4 MG/ML INJECTION
0.4 mg/mL | INTRAMUSCULAR | Status: DC | PRN
Start: 2020-08-25 — End: 2020-08-26

## 2020-08-25 MED ORDER — ONDANSETRON (PF) 4 MG/2 ML INJECTION
4 mg/2 mL | INTRAMUSCULAR | Status: DC | PRN
Start: 2020-08-25 — End: 2020-08-26

## 2020-08-25 MED ORDER — SODIUM CHLORIDE 0.9 % IJ SYRG
Freq: Three times a day (TID) | INTRAMUSCULAR | Status: DC
Start: 2020-08-25 — End: 2020-08-26
  Administered 2020-08-26 (×2): via INTRAVENOUS

## 2020-08-25 MED ORDER — MIDAZOLAM 1 MG/ML IJ SOLN
1 mg/mL | INTRAMUSCULAR | Status: DC | PRN
Start: 2020-08-25 — End: 2020-08-25
  Administered 2020-08-25: 12:00:00 via INTRAVENOUS

## 2020-08-25 MED ORDER — LABETALOL 5 MG/ML IV SYRINGE
20 mg/4 mL (5 mg/mL) | INTRAVENOUS | Status: DC | PRN
Start: 2020-08-25 — End: 2020-08-25
  Administered 2020-08-25: 12:00:00 via INTRAVENOUS

## 2020-08-25 MED ORDER — DIPHENHYDRAMINE HCL 50 MG/ML IJ SOLN
50 mg/mL | INTRAMUSCULAR | Status: DC | PRN
Start: 2020-08-25 — End: 2020-08-25

## 2020-08-25 MED ORDER — THROMBIN (RECOMBINANT) 5,000 UNIT TOPICAL SOLUTION
5000 unit | CUTANEOUS | Status: AC
Start: 2020-08-25 — End: ?

## 2020-08-25 MED ORDER — GELATIN ADSORBABLE 100 TOPICAL SPONGE
100 | CUTANEOUS | Status: DC | PRN
Start: 2020-08-25 — End: 2020-08-25
  Administered 2020-08-25: 12:00:00 via TOPICAL

## 2020-08-25 MED ORDER — MIDAZOLAM 1 MG/ML IJ SOLN
1 mg/mL | INTRAMUSCULAR | Status: AC
Start: 2020-08-25 — End: ?

## 2020-08-25 MED ORDER — ONDANSETRON (PF) 4 MG/2 ML INJECTION
4 mg/2 mL | Freq: Once | INTRAMUSCULAR | Status: DC
Start: 2020-08-25 — End: 2020-08-25
  Administered 2020-08-25: 13:00:00 via INTRAVENOUS

## 2020-08-25 MED ORDER — BUPIVACAINE (PF) 0.5 % (5 MG/ML) IJ SOLN
0.5 % (5 mg/mL) | INTRAMUSCULAR | Status: AC
Start: 2020-08-25 — End: ?

## 2020-08-25 MED ORDER — HYDROMORPHONE 0.5 MG/0.5 ML SYRINGE
0.5 mg/ mL | INTRAMUSCULAR | Status: DC | PRN
Start: 2020-08-25 — End: 2020-08-25

## 2020-08-25 MED ORDER — CEFAZOLIN 1 GRAM SOLUTION FOR INJECTION
1 gram | INTRAMUSCULAR | Status: DC | PRN
Start: 2020-08-25 — End: 2020-08-25
  Administered 2020-08-25: 12:00:00 via INTRAVENOUS

## 2020-08-25 MED ORDER — PHENOL 1.4 % MUCOSAL AEROSOL SPRAY
1.4 % | Status: DC | PRN
Start: 2020-08-25 — End: 2020-08-26

## 2020-08-25 MED ORDER — DIPHENHYDRAMINE HCL 50 MG/ML IJ SOLN
50 mg/mL | INTRAMUSCULAR | Status: DC | PRN
Start: 2020-08-25 — End: 2020-08-26

## 2020-08-25 MED ORDER — OXYCODONE 5 MG TAB
5 mg | ORAL | Status: DC | PRN
Start: 2020-08-25 — End: 2020-08-26

## 2020-08-25 MED ORDER — BENZOCAINE-MENTHOL 15 MG-10 MG LOZENGES
15-10 mg | Status: DC | PRN
Start: 2020-08-25 — End: 2020-08-26

## 2020-08-25 MED ORDER — VANCOMYCIN 1,000 MG IV SOLR
1000 mg | INTRAVENOUS | Status: DC | PRN
Start: 2020-08-25 — End: 2020-08-25
  Administered 2020-08-25: 12:00:00

## 2020-08-25 MED ORDER — CEFAZOLIN 1 GRAM SOLUTION FOR INJECTION
1 gram | Freq: Three times a day (TID) | INTRAMUSCULAR | Status: DC
Start: 2020-08-25 — End: 2020-08-26
  Administered 2020-08-25 – 2020-08-26 (×3): via INTRAVENOUS

## 2020-08-25 MED ORDER — DIAZEPAM 5 MG TAB
5 mg | Freq: Four times a day (QID) | ORAL | Status: DC | PRN
Start: 2020-08-25 — End: 2020-08-26

## 2020-08-25 MED ORDER — BUPIVACAINE (PF) 0.5 % (5 MG/ML) IJ SOLN
0.5 % (5 mg/mL) | INTRAMUSCULAR | Status: DC | PRN
Start: 2020-08-25 — End: 2020-08-25
  Administered 2020-08-25: 12:00:00

## 2020-08-25 MED ORDER — ROCURONIUM 10 MG/ML IV
10 mg/mL | INTRAVENOUS | Status: DC | PRN
Start: 2020-08-25 — End: 2020-08-25
  Administered 2020-08-25 (×2): via INTRAVENOUS

## 2020-08-25 MED ORDER — SODIUM CHLORIDE 0.9 % IJ SYRG
Freq: Three times a day (TID) | INTRAMUSCULAR | Status: DC
Start: 2020-08-25 — End: 2020-08-25
  Administered 2020-08-25: 13:00:00 via INTRAVENOUS

## 2020-08-25 MED ORDER — FENTANYL CITRATE (PF) 50 MCG/ML IJ SOLN
50 mcg/mL | INTRAMUSCULAR | Status: DC | PRN
Start: 2020-08-25 — End: 2020-08-25
  Administered 2020-08-25: 12:00:00 via INTRAVENOUS

## 2020-08-25 MED ORDER — LORAZEPAM 2 MG/ML SYRINGE
2 mg/mL | Freq: Four times a day (QID) | INTRAMUSCULAR | Status: DC | PRN
Start: 2020-08-25 — End: 2020-08-26

## 2020-08-25 MED ORDER — LACTATED RINGERS IV
INTRAVENOUS | Status: DC
Start: 2020-08-25 — End: 2020-08-25
  Administered 2020-08-25: 13:00:00 via INTRAVENOUS

## 2020-08-25 MED ORDER — FAMOTIDINE 20 MG TAB
20 mg | Freq: Once | ORAL | Status: DC
Start: 2020-08-25 — End: 2020-08-25

## 2020-08-25 MED ORDER — PROPOFOL 10 MG/ML IV EMUL
10 mg/mL | INTRAVENOUS | Status: DC | PRN
Start: 2020-08-25 — End: 2020-08-25
  Administered 2020-08-25: 12:00:00 via INTRAVENOUS

## 2020-08-25 MED ORDER — FENTANYL CITRATE (PF) 50 MCG/ML IJ SOLN
50 mcg/mL | INTRAMUSCULAR | Status: AC
Start: 2020-08-25 — End: ?

## 2020-08-25 MED ORDER — HYDROMORPHONE 1 MG/ML INJECTION SOLUTION
1 mg/mL | INTRAMUSCULAR | Status: DC | PRN
Start: 2020-08-25 — End: 2020-08-26

## 2020-08-25 MED ORDER — VANCOMYCIN 1,000 MG IV SOLR
1000 mg | INTRAVENOUS | Status: AC
Start: 2020-08-25 — End: ?

## 2020-08-25 MED ORDER — DEXAMETHASONE SODIUM PHOSPHATE 4 MG/ML IJ SOLN
4 mg/mL | INTRAMUSCULAR | Status: DC | PRN
Start: 2020-08-25 — End: 2020-08-25
  Administered 2020-08-25: 12:00:00 via INTRAVENOUS

## 2020-08-25 MED ORDER — SODIUM CHLORIDE 0.9 % IJ SYRG
INTRAMUSCULAR | Status: DC | PRN
Start: 2020-08-25 — End: 2020-08-25

## 2020-08-25 MED FILL — PREGABALIN 75 MG CAP: 75 mg | ORAL | Qty: 2

## 2020-08-25 MED FILL — BD POSIFLUSH NORMAL SALINE 0.9 % INJECTION SYRINGE: INTRAMUSCULAR | Qty: 40

## 2020-08-25 MED FILL — CEFAZOLIN 1 GRAM SOLUTION FOR INJECTION: 1 gram | INTRAMUSCULAR | Qty: 3000

## 2020-08-25 MED FILL — CETIRIZINE 10 MG TAB: 10 mg | ORAL | Qty: 1

## 2020-08-25 MED FILL — MIDAZOLAM 1 MG/ML IJ SOLN: 1 mg/mL | INTRAMUSCULAR | Qty: 2

## 2020-08-25 MED FILL — ACETAMINOPHEN 500 MG TAB: 500 mg | ORAL | Qty: 2

## 2020-08-25 MED FILL — LACTATED RINGERS IV: INTRAVENOUS | Qty: 1000

## 2020-08-25 MED FILL — FAMOTIDINE 20 MG TAB: 20 mg | ORAL | Qty: 2

## 2020-08-25 MED FILL — BACLOFEN 10 MG TAB: 10 mg | ORAL | Qty: 1

## 2020-08-25 MED FILL — HYDROMORPHONE (PF) 1 MG/ML IJ SOLN: 1 mg/mL | INTRAMUSCULAR | Qty: 1

## 2020-08-25 MED FILL — FENTANYL CITRATE (PF) 50 MCG/ML IJ SOLN: 50 mcg/mL | INTRAMUSCULAR | Qty: 2

## 2020-08-25 MED FILL — RECOTHROM 5,000 UNIT TOPICAL SOLUTION: 5000 unit | CUTANEOUS | Qty: 1

## 2020-08-25 MED FILL — BENZOCAINE-MENTHOL 15 MG-10 MG LOZENGES: 15-10 mg | Qty: 5

## 2020-08-25 MED FILL — DEXAMETHASONE SODIUM PHOSPHATE 4 MG/ML IJ SOLN: 4 mg/mL | INTRAMUSCULAR | Qty: 3

## 2020-08-25 MED FILL — VANCOMYCIN 1,000 MG IV SOLR: 1000 mg | INTRAVENOUS | Qty: 1000

## 2020-08-25 MED FILL — NORVASC 5 MG TABLET: 5 mg | ORAL | Qty: 1

## 2020-08-25 MED FILL — DOCUSATE SODIUM 100 MG CAP: 100 mg | ORAL | Qty: 1

## 2020-08-25 MED FILL — BUPIVACAINE (PF) 0.5 % (5 MG/ML) IJ SOLN: 0.5 % (5 mg/mL) | INTRAMUSCULAR | Qty: 30

## 2020-08-25 MED FILL — SURGIFOAM 100 CM SPONGE: 100 cm | CUTANEOUS | Qty: 1

## 2020-08-25 MED FILL — LORAZEPAM 2 MG/ML IJ SOLN: 2 mg/mL | INTRAMUSCULAR | Qty: 0.5

## 2020-08-25 NOTE — Progress Notes (Signed)
Problem: Falls - Risk of  Goal: *Absence of Falls  Description: Document Bridgette Habermann Fall Risk and appropriate interventions in the flowsheet.  Outcome: Progressing Towards Goal  Note: Fall Risk Interventions:  Mobility Interventions: Patient to call before getting OOB         Medication Interventions: Teach patient to arise slowly,Patient to call before getting OOB    Elimination Interventions: Patient to call for help with toileting needs,Call light in reach              Problem: Patient Education: Go to Patient Education Activity  Goal: Patient/Family Education  Outcome: Progressing Towards Goal     Problem: Pain  Goal: *Control of Pain  Outcome: Progressing Towards Goal  Goal: *PALLIATIVE CARE:  Alleviation of Pain  Outcome: Progressing Towards Goal     Problem: Patient Education: Go to Patient Education Activity  Goal: Patient/Family Education  Outcome: Progressing Towards Goal     Problem: Infection - Risk of, Surgical Site Infection  Goal: *Absence of surgical site infection signs and symptoms  Outcome: Progressing Towards Goal     Problem: Patient Education: Go to Patient Education Activity  Goal: Patient/Family Education  Outcome: Progressing Towards Goal

## 2020-08-25 NOTE — Progress Notes (Signed)
Problem: Falls - Risk of  Goal: *Absence of Falls  Description: Document [name] [name] Fall Risk and appropriate interventions in the flowsheet.  Outcome: Progressing Towards Goal  Note: Fall Risk Interventions:  Mobility Interventions: Patient to call before getting OOB         Medication Interventions: Teach patient to arise slowly,Patient to call before getting OOB    Elimination Interventions: Patient to call for help with toileting needs,Call light in reach              Problem: Patient Education: Go to Patient Education Activity  Goal: Patient/Family Education  Outcome: Progressing Towards Goal     Problem: Pain  Goal: *Control of Pain  Outcome: Progressing Towards Goal     Problem: Patient Education: Go to Patient Education Activity  Goal: Patient/Family Education  Outcome: Progressing Towards Goal

## 2020-08-25 NOTE — Progress Notes (Signed)
vss afeb  Leg pain resolved  Drain other than initial amount, scant  Feels betterr  Plan  Observe overnight , hopefully dc drain and dc home

## 2020-08-25 NOTE — Progress Notes (Signed)
vss afeb  Neuro intact  Cramping improved, but still with leg pain  Plan  Will take to OR to evacuate hematoma and place drain  RBCA discussed and patient consents

## 2020-08-25 NOTE — Progress Notes (Signed)
 TRANSFER - IN REPORT:    Verbal report received from R. Mutia, RN (name) on [name] [name]  being received from 5S (unit) for ordered procedure      Report consisted of patient's Situation, Background, Assessment and   Recommendations(SBAR).     Information from the following report(s) SBAR, [facility], [facility] and Recent Results was reviewed with the receiving nurse.    Opportunity for questions and clarification was provided.

## 2020-08-25 NOTE — Anesthesia Pre-Procedure Evaluation (Signed)
Relevant Problems   No relevant active problems       Anesthetic History   No history of anesthetic complications            Review of Systems / Medical History  Patient summary reviewed and pertinent labs reviewed    Pulmonary        Sleep apnea: No treatment    Asthma : well controlled       Neuro/Psych   Within defined limits           Cardiovascular  Within defined limits                Exercise tolerance: >4 METS     GI/Hepatic/Renal  Within defined limits              Endo/Other        Morbid obesity     Other Findings              Physical Exam    Airway  Mallampati: II  TM Distance: 4 - 6 cm  Neck ROM: normal range of motion   Mouth opening: Normal     Cardiovascular  Regular rate and rhythm,  S1 and S2 normal,  no murmur, click, rub, or gallop  Rhythm: regular  Rate: normal         Dental    Dentition: Lower dentition intact and Upper dentition intact     Pulmonary  Breath sounds clear to auscultation               Abdominal  GI exam deferred       Other Findings            Anesthetic Plan    ASA: 3  Anesthesia type: general            Anesthetic plan and risks discussed with: Patient

## 2020-08-25 NOTE — Op Note (Signed)
Brief Postoperative Note    Patient: [name] [name]  Date of Birth: Oct 07, 1965  MRN: [id number]    Date of Procedure: 08/25/2020     Pre-Op Diagnosis: lumbar hematoma L4/5    Post-Op Diagnosis: Same as preoperative diagnosis.      Procedure(s):  INCISION AND DRAINAGE TRUNK/ EVACUATION OF LUMBAR HEMATOMA L4/5    Surgeon(s):  Pamalee Leyden, MD    Surgical Assistant: Surg Asst-1: Music, Amy    Anesthesia: General     Estimated Blood Loss (mL): Minimal    Complications: None    Specimens: * No specimens in log *     Implants: * No implants in log *    Drains: * No LDAs found *    Findings: epidural hematoma, no active bleeding , no evident hnp    Electronically Signed by Pamalee Leyden, MD on 08/25/2020 at 8:26 AM

## 2020-08-25 NOTE — Anesthesia Post-Procedure Evaluation (Signed)
Procedure(s):  INCISION AND DRAINAGE TRUNK/ EVACUATION OF LUMBAR HEMATOMA L4/5.    general    Anesthesia Post Evaluation      Multimodal analgesia: multimodal analgesia used between 6 hours prior to anesthesia start to PACU discharge  Patient location during evaluation: PACU  Patient participation: complete - patient participated  Level of consciousness: awake  Pain management: adequate  Airway patency: patent  Anesthetic complications: no  Cardiovascular status: acceptable  Respiratory status: acceptable  Hydration status: acceptable  Post anesthesia nausea and vomiting:  none  Final Post Anesthesia Temperature Assessment:  Normothermia (36.0-37.5 degrees C)      INITIAL Post-op Vital signs:   Vitals Value Taken Time   BP 126/81 08/25/20 0926   Temp 36.1 ??C (97 ??F) 08/25/20 0916   Pulse 74 08/25/20 0930   Resp 14 08/25/20 0930   SpO2 98 % 08/25/20 0930   Vitals shown include unvalidated device data.

## 2020-08-26 ENCOUNTER — Encounter: Attending: Sports Medicine | Primary: Internal Medicine

## 2020-08-26 LAB — CBC
Hematocrit: 34.5 % — ABNORMAL LOW (ref 35.0–45.0)
Hemoglobin: 10.6 g/dL — ABNORMAL LOW (ref 12.0–16.0)
MCH: 24.2 PG (ref 24.0–34.0)
MCHC: 30.7 g/dL — ABNORMAL LOW (ref 31.0–37.0)
MCV: 78.8 FL (ref 78.0–100.0)
MPV: 9.7 FL (ref 9.2–11.8)
NRBC Absolute: 0 10*3/uL (ref 0.00–0.01)
Nucleated RBCs: 0 PER 100 WBC
Platelets: 236 10*3/uL (ref 135–420)
RBC: 4.38 M/uL (ref 4.20–5.30)
RDW: 13.7 % (ref 11.6–14.5)
WBC: 10.4 10*3/uL (ref 4.6–13.2)

## 2020-08-26 LAB — CBC W/O DIFF
ABSOLUTE NRBC: 0 10*3/uL (ref 0.00–0.01)
HCT: 34.5 % — ABNORMAL LOW (ref 35.0–45.0)
HGB: 10.6 g/dL — ABNORMAL LOW (ref 12.0–16.0)
MCH: 24.2 PG (ref 24.0–34.0)
MCHC: 30.7 g/dL — ABNORMAL LOW (ref 31.0–37.0)
MCV: 78.8 FL (ref 78.0–100.0)
MPV: 9.7 FL (ref 9.2–11.8)
NRBC: 0 PER 100 WBC
PLATELET: 236 10*3/uL (ref 135–420)
RBC: 4.38 M/uL (ref 4.20–5.30)
RDW: 13.7 % (ref 11.6–14.5)
WBC: 10.4 10*3/uL (ref 4.6–13.2)

## 2020-08-26 MED ORDER — CEPHALEXIN 250 MG CAP
250 mg | ORAL_CAPSULE | Freq: Four times a day (QID) | ORAL | 0 refills | Status: DC
Start: 2020-08-26 — End: 2020-11-28

## 2020-08-26 MED FILL — DOCUSATE SODIUM 100 MG CAP: 100 mg | ORAL | Qty: 1

## 2020-08-26 MED FILL — WATER FOR INJECTION, STERILE INJECTION: INTRAMUSCULAR | Qty: 40

## 2020-08-26 MED FILL — FAMOTIDINE 20 MG TAB: 20 mg | ORAL | Qty: 2

## 2020-08-26 MED FILL — CETIRIZINE 10 MG TAB: 10 mg | ORAL | Qty: 1

## 2020-08-26 MED FILL — CEFAZOLIN 1 GRAM SOLUTION FOR INJECTION: 1 gram | INTRAMUSCULAR | Qty: 3000

## 2020-08-26 MED FILL — PREGABALIN 75 MG CAP: 75 mg | ORAL | Qty: 2

## 2020-08-26 MED FILL — ACETAMINOPHEN 500 MG TAB: 500 mg | ORAL | Qty: 2

## 2020-08-26 MED FILL — NORVASC 5 MG TABLET: 5 mg | ORAL | Qty: 1

## 2020-08-26 MED FILL — BACLOFEN 10 MG TAB: 10 mg | ORAL | Qty: 1

## 2020-08-26 NOTE — Progress Notes (Signed)
Problem: Falls - Risk of  Goal: *Absence of Falls  Description: Document [name] [name] Fall Risk and appropriate interventions in the flowsheet.  Outcome: Progressing Towards Goal  Note: Fall Risk Interventions:  Mobility Interventions: Patient to call before getting OOB         Medication Interventions: Teach patient to arise slowly,Patient to call before getting OOB    Elimination Interventions: Call light in reach              Problem: Pain  Goal: *Control of Pain  Outcome: Progressing Towards Goal  Goal: *PALLIATIVE CARE:  Alleviation of Pain  Outcome: Progressing Towards Goal

## 2020-08-26 NOTE — Progress Notes (Signed)
Problem: Falls - Risk of  Goal: *Absence of Falls  Description: Document [name] [name] Fall Risk and appropriate interventions in the flowsheet.  Outcome: Progressing Towards Goal  Note: Fall Risk Interventions:  Mobility Interventions: Patient to call before getting OOB         Medication Interventions: Teach patient to arise slowly,Patient to call before getting OOB    Elimination Interventions: Call light in reach              Problem: Patient Education: Go to Patient Education Activity  Goal: Patient/Family Education  Outcome: Progressing Towards Goal     Problem: Pain  Goal: *Control of Pain  Outcome: Progressing Towards Goal     Problem: Patient Education: Go to Patient Education Activity  Goal: Patient/Family Education  Outcome: Progressing Towards Goal

## 2020-08-26 NOTE — Home Health (Signed)
Received home health referral for [facility] for (SN, PT - post Op protocol).  Spoke with patient via phone;  patient identifiers verified.  Explained home care services and routines.  Demographics verified including insurance, phone and address confirmed.  Patient has the following DME: none ordered.  Caregivers available - will stay at daughter's home til 08/29/20.  Has family available.  Orders noted and arranged to be processed to central intake.      ---    Hae Rudean Curt, LPN  [facility] Liaison

## 2020-08-26 NOTE — Progress Notes (Signed)
Observation notice provided in writing to patient and/or caregiver as well as verbal explanation of the policy.  Patients who are in outpatient status also receive the Observation notice.

## 2020-08-26 NOTE — Progress Notes (Signed)
HH order received. Called and spoke with Hae Shon Hough at [facility] they could accept pt's insurance and pt. Hae Shon Hough verified  [facility] could accept pt and insurance.     Discussed HH order with pt at bedside. FOC was provided, signed and completed for [facility] and placed in pt's chart.     Glenis Smoker, MSW  Care Management

## 2020-08-26 NOTE — Progress Notes (Signed)
 Problem: Self Care Deficits Care Plan (Adult)  Goal: *Acute Goals and Plan of Care (Insert Text)  Outcome: Resolved/Met       OCCUPATIONAL THERAPY EVALUATION/DISCHARGE    Patient: [name] [name] (55 y.o. 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      ASSESSMENT AND RECOMMENDATIONS:  Upon entering the room, the patient was seated in chair, alert, and agreeable to participate in OT evaluation. Spinal precautions reviewed and patient verbalized and demonstrated understanding. Based on the objective data described below, the patient presents Mod (I) to (I) in basic self-care/ADL tasks. At this time, patient with no deficits that impede pt function with self-care, functional mobility, or functional transfers in preparation for ADL tasks. Patient is safe to d/c home with family support from self-care standpoint. No further skilled OT needed at this level of care. OT to d/c from caseload.        Skilled occupational therapy is not indicated at this time.  Discharge Recommendations: Home with family support  Further Equipment Recommendations for Discharge: Patient has all DME      SUBJECTIVE:   Patient stated "there's a pinch in my butt but no pain"    OBJECTIVE DATA SUMMARY:     Past Medical History:   Diagnosis Date    Asthma     COVID-19 11/2018, 02/2020    H/O seasonal allergies     Headache     Hx of cholecystectomy 1999    Hypertension     Morbid obesity (HCC)     Sleep apnea 07/2020    no cpap     Past Surgical History:   Procedure Laterality Date    COLONOSCOPY N/A 01/25/2020    COLONOSCOPY performed by Gerlean JINNY Anes, MD at [facility] ENDOSCOPY    HX APPENDECTOMY  2006    HX CESAREAN SECTION  1987, 1994, 1998    HX CHOLECYSTECTOMY   1998    HX HYSTERECTOMY      uterus removed    HX HYSTERECTOMY  2012     Barriers to Learning/Limitations: None  Compensate with: visual, verbal, tactile, kinesthetic cues/model    Home Situation:   Home Situation  Home Environment: Private residence  # Steps to Enter: 5  One/Two Story Residence: Two story  Living Alone: No  Support Systems: Child(ren)  Patient Expects to be Discharged to:: Home with family assistance  Current DME Used/Available at Home: Walker, rolling  [x]      Right hand dominant   []      Left hand dominant    Cognitive/Behavioral Status:  Neurologic State: Alert  Orientation Level: Oriented X4  Cognition: Follows commands  Safety/Judgement: Awareness of environment    Skin: Intact  Edema: None noted    Vision/Perceptual:    Acuity: Within Defined Limits      Coordination: BUE  Coordination: Within functional limits  Fine Motor Skills-Upper: Left Intact;Right Intact    Gross Motor Skills-Upper: Left Intact;Right Intact    Balance:  Sitting: Intact  Standing: Intact    Strength: BUE  Strength: Generally decreased, functional     Tone & Sensation: BUE    Tone: Normal  Sensation: Intact    Range of Motion: BUE  AROM: Within functional limits    Functional  Mobility and Transfers for ADLs:  Bed Mobility:  Scooting: Modified independent    Transfers:  Sit to Stand: Modified independent  Stand to Sit: Modified independent   Toilet Transfer : Modified independent (simulation with recliner)    ADL Assessment:  Feeding: Independent    Oral Facial Hygiene/Grooming: Independent    Bathing: Modified independent    Upper Body Dressing: Independent    Lower Body Dressing: Modified independent    Toileting: Modified independent    ADL Intervention:  Upper Body Dressing Assistance  Dressing Assistance: Independent  Hospital Gown: Independent    Lower Body Dressing Assistance  Dressing Assistance: Modified independent  Socks: Modified independent  Leg Crossed Method Used: Yes (observed physically lifting LLE with  hands)  Position Performed: Seated in chair    Cognitive Retraining  Safety/Judgement: Awareness of environment    Pain:  Pain level pre-treatment: 0/10   Pain level post-treatment: 0/10   Pain Intervention(s): Medication (see MAR); Rest, Ice, Repositioning   Response to intervention: Nurse notified, See doc flow    Activity Tolerance:   Good      Please refer to the flowsheet for vital signs taken during this treatment.  After treatment:   [x]   Patient left in no apparent distress sitting up in chair  []   Patient left in no apparent distress in bed  [x]   Call bell left within reach  [x]   Nursing notified  []   Caregiver present  []   Bed alarm activated    COMMUNICATION/EDUCATION:   [x]       Role of Occupational Therapy in the acute care setting  [x]       Home safety education was provided and the patient/caregiver indicated understanding.  [x]       Patient/family have participated as able and agree with findings and recommendations.  []       Patient is unable to participate in plan of care at this time.    Thank you for this referral.  Trine LOISE Kirsch, OTR/L  Time Calculation: 23 mins      Eval Complexity: History: LOW Complexity : Brief history review ;   Examination: LOW Complexity : 1-3 performance deficits relating to physical, cognitive , or psychosocial skils that result in activity limitations and / or participation restrictions ;   Decision Making:LOW Complexity : No comorbidities that affect functional and no verbal or physical assistance needed to complete eval tasks

## 2020-08-26 NOTE — Progress Notes (Signed)
 Problem: Mobility Impaired (Adult and Pediatric)  Goal: *Acute Goals and Plan of Care (Insert Text)  Description: Pt able to amb around the unit and perform stairs with safety. Pt not in need of acute PT at this time. Recommend d/c home.     PLOF: Pt lives with her daughter in a 2-story home. Indep PTA.     Outcome: Progressing Towards Goal     PHYSICAL THERAPY EVALUATION AND DISCHARGE    Patient: [name] [name] (55 y.o. 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 RN cleared for mobility. Pt found amb out of bathroom without an AD, steady gait. She reports feeling much better, She then amb 300 ft in hallways with no AD and supervision, as well as ascended/descended 4 steps with a step-through pattern, B HR, and good safety. She returned sitting up in recliner with B LE's elevated, call bell nearby, all needs met. Pt not in need of acute PT. Recommend d/c home.    Patient does not require further skilled intervention at this level of care.        PLAN :  Recommendations and Planned Interventions:   No formal PT needs identified at this time.  Discharge Recommendations: Home Health  Further Equipment Recommendations for Discharge: N/A     SUBJECTIVE:   Patient stated "it feels good to walk without pain."    OBJECTIVE DATA SUMMARY:     Past Medical History:   Diagnosis Date    Asthma     COVID-19 11/2018, 02/2020    H/O seasonal allergies     Headache     Hx of cholecystectomy 1999    Hypertension     Morbid obesity (HCC)     Sleep apnea 07/2020    no cpap     Past Surgical History:   Procedure Laterality Date    COLONOSCOPY N/A 01/25/2020    COLONOSCOPY performed by Gerlean JINNY Anes, MD at [facility]  ENDOSCOPY    HX APPENDECTOMY  2006    HX CESAREAN SECTION  1987, 1994, 1998    HX CHOLECYSTECTOMY  1998    HX HYSTERECTOMY      uterus removed    HX HYSTERECTOMY  2012     Barriers to Learning/Limitations: None  Compensate with: N/A  Home Situation:   Home Situation  Home Environment: Private residence  # Steps to Enter: 5  One/Two Story Residence: Two story  Living Alone: No  Support Systems: Child(ren)  Patient Expects to be Discharged to:: Home with family assistance  Current DME Used/Available at Home: Environmental consultant, rolling  Critical Behavior:  Neurologic State: Alert  Orientation Level: Oriented X4  Cognition: Follows commands  Safety/Judgement: Awareness of environment  Psychosocial  Patient Behaviors: Calm;Cooperative        Skin Integrity: Incision (comment) (back)  Skin Integumentary  Skin Integrity: Incision (comment) (back)     Strength:    Strength: Generally decreased, functional       Tone & Sensation:   Tone: Normal    Sensation: Intact       Range Of Motion:  AROM: Within functional limits       Scooting: Modified independent  Transfers:  Sit to Stand: Modified independent  Stand to Sit: Modified independent       Balance:   Sitting: Intact  Standing: Intact  Ambulation/Gait Training:  Distance (ft):  (300)  Assistive Device: Other (comment) (none)  Ambulation - Level of Assistance: Supervision        Gait Abnormalities: Decreased step clearance       Stairs:  Number of Stairs Trained: 4  Stairs - Level of Assistance: Supervision  Rail Use: Both     Pain:  Pain level pre-treatment: 2/10   Pain level post-treatment: 2/10  Pain Intervention(s): Medication (see MAR); Rest, Ice, Repositioning   Response to intervention: Nurse notified, See doc flow    Activity Tolerance:   Pt tolerated mobility well  Please refer to the flowsheet for vital signs taken during this treatment.  After treatment:   [x]          Patient left in no apparent distress sitting up in chair  []          Patient left in no apparent distress  in bed  [x]          Call bell left within reach  [x]          Nursing notified  []          Caregiver present  []          Bed alarm activated  []          SCDs applied    COMMUNICATION/EDUCATION:   [x]          Role of Physical Therapy in the acute care setting.  [x]          Fall prevention education was provided and the patient/caregiver indicated understanding.  [x]          Patient/family have participated as able in goal setting and plan of care.  []          Patient/family agree to work toward stated goals and plan of care.  []          Patient understands intent and goals of therapy, but is neutral about his/her participation.  []          Patient is unable to participate in goal setting/plan of care: ongoing with therapy staff.  []          Other:    Thank you for this referral.  Hoy Mass   Time Calculation: 16 mins      Eval Complexity: History: LOW Complexity : Zero comorbidities / personal factors that will impact the outcome / POCExam:LOW Complexity : 1-2 Standardized tests and measures addressing body structure, function, activity limitation and / or participation in recreation  Presentation: LOW Complexity : Stable, uncomplicated  Clinical Decision Making:Low Complexity    Overall Complexity:LOW

## 2020-08-26 NOTE — Progress Notes (Signed)
Discharge info reviewed with pt no questions or concerns.

## 2020-08-26 NOTE — Progress Notes (Signed)
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 Cognitive status of patiert    Face sheet information confirmed:  yes.  The patient designates daughter, Tomasa Blase, to participate in her discharge plan and to receive any needed information.     This patient lives in a townhome alone. Pt will dc to daughter's home until able to return home to assist with dependent care.   Patient is able to navigate steps as needed.  Prior to hospitalization, patient was considered to be independent with ADLs/IADLS : yes . Patient has a current ACP document on file: no      Healthcare Decision Maker:       The { daugher will be available to transport patient home upon discharge.   The patient already has Dan Humphreys, available in the home.     Patient is not currently active with home health. Patient has not stayed in a skilled nursing facility or rehab.  Was  stay within last 60 days : no.      This patient is on dialysis :no    List of available Home Health agencies were provided and reviewed with the patient prior to discharge.   Freedom of choice signed: yes, for [facility].    Currently, the discharge plan is Home with Home Health.    The patient states that she can obtain her medications from the pharmacy, and take her medications as directed.    Patient's current insurance is [facility] Management Interventions  PCP Verified by CM: Yes  Mode of Transport at Discharge: Other (see comment) (pt's daughter will transport at [employer] [employer])  Transition of Care Consult (CM Consult): Discharge Planning  Physical Therapy Consult: Yes  Occupational Therapy Consult: Yes  Support Systems: Child(ren)  Confirm Follow Up Transport: Family (pt's  daughter will transport at [employer])  The Patient and/or Patient Representative was Provided with a Choice of Provider and Agrees with the Discharge Plan?: Yes  Freedom of Choice List was Provided with Basic Dialogue that Supports the Patient's Individualized Plan of Care/Goals, Treatment Preferences and Shares the Quality Data Associated with the Providers?: Yes  Discharge Location  Patient Expects to be Discharged to:: Home with home health        Glenis Smoker, MSW  Care Management

## 2020-08-26 NOTE — Progress Notes (Signed)
vss afeb,neuro intact  Back and leg pain resolved  No headache  Drain output higher than anticipated, serous,   No csf leak noted on exploration, no headache, feels great, ambulating without issue  Plan  Dc home  Fu tomorrow in office for drain removal

## 2020-08-26 NOTE — Discharge Summary (Signed)
Discharge/Transfer  Summary     Patient: [name] [name] MRN: [id number]  SSN: [id number]    Date of Birth: October 17, 1965  Age: 55 y.o.  Sex: female       Admit Date: 08/24/2020    Discharge Date: 08/26/2020      Admission Diagnoses: Radiculopathy due to lumbar intervertebral disc disorder [M51.16]    Discharge Diagnoses:   Problem List as of 08/26/2020 Date Reviewed: 2020-09-15          Codes Class Noted - Resolved    Radiculopathy due to lumbar intervertebral disc disorder ICD-10-CM: M51.16  ICD-9-CM: 722.10  08/24/2020 - Present        Mild persistent asthma in adult without complication ICD-10-CM: J45.30  ICD-9-CM: 493.90  12/06/2013 - Present        Perennial allergic rhinitis with seasonal variation ICD-10-CM: J30.89, J30.2  ICD-9-CM: 477.9  12/06/2013 - Present        Episodic tension-type headache, not intractable ICD-10-CM: G44.219  ICD-9-CM: 339.11  12/06/2013 - Present        Obesity, Class III, BMI 40-49.9 (morbid obesity) (HCC) ICD-10-CM: E66.01  ICD-9-CM: 278.01  12/06/2013 - Present        Cervical polyp ICD-10-CM: N84.1  ICD-9-CM: 622.7  10/10/2012 - Present               Discharge Condition: Good      Surgery: INCISION AND DRAINAGE TRUNK/ 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  Study demonstrated epidural hematoma with canal compromise  pain without improvement  Brought to OR for drainage  Pain resolved.  Drain output still elevated and patient to return to office tomorrow for drain removal.      Disposition: home    Discharge Medications:   Current Discharge Medication List      START taking these medications    Details   cephALEXin (KEFLEX) 250 mg capsule Take 2 Capsules by mouth four (4) times daily.  Qty: 28 Capsule, Refills: 0         CONTINUE these medications which have NOT CHANGED    Details   oxyCODONE IR (Roxicodone) 5 mg immediate release  tablet Take 1 Tablet by mouth every six (6) hours as needed for Pain for up to 7 days. Max Daily Amount: 20 mg.  Qty: 28 Tablet, Refills: 0    Associated Diagnoses: S/P lumbar laminectomy      pregabalin (Lyrica) 150 mg capsule Take 1 Capsule by mouth two (2) times a day. Max Daily Amount: 300 mg.  Qty: 60 Capsule, Refills: 2    Associated Diagnoses: Lumbar stenosis with neurogenic claudication      amLODIPine (NORVASC) 5 mg tablet Take 5 mg by mouth daily.      multivitamin (ONE A DAY) tablet Take 1 Tab by mouth daily.      albuterol (PROAIR HFA) 90 mcg/actuation inhaler Take 2 Puffs by inhalation every six (6) hours as needed for Wheezing. Indications: ACUTE ASTHMA ATTACK  Qty: 1 Inhaler, Refills: 5    Associated Diagnoses: Mild persistent asthma in adult without complication      cetirizine (ZYRTEC) 10 mg tablet Take 1 Tab by mouth daily. Indications: PERENNIAL ALLERGIC RHINITIS  Qty: 30 Tab, Refills: 5    Associated Diagnoses: Perennial allergic rhinitis with seasonal variation      fluticasone (FLONASE)  50 mcg/actuation nasal spray 2 Sprays by Both Nostrils route daily. Indications: ALLERGIC RHINITIS  Qty: 1 Bottle, Refills: 5    Associated Diagnoses: Perennial allergic rhinitis with seasonal variation               Follow-up Appointments   Procedures   ??? FOLLOW UP VISIT Appointment in: 3 - 5 Days Come to office Tuesday ([facility]) for drain removal Otherwise return in 2 weeks as previously scheduled.     Come to office Tuesday ([facility]) for drain removal  Otherwise return in 2 weeks as previously scheduled.     Standing Status:   Standing     Number of Occurrences:   1     Order Specific Question:   Appointment in     Answer:   3 - 5 Days       Signed By: Pamalee Leyden, MD     August 26, 2020

## 2020-08-26 NOTE — Progress Notes (Signed)
Discharge order noted for today. Pt has been accepted to [facility] agency. Met with patient and she is  agreeable to the transition plan today. Transport has been arranged through daugher. Patient's discharge summary and home health  orders have been  forwarded to [facility] home health  agency via [facility]. Updated bedside RN, Rana,  to the transition plan.  Discharge information has been documented on the AVS.       Remer Macho, MSW  Care Management

## 2020-08-26 NOTE — Progress Notes (Signed)
Drain teaching performed with pt with teachback. No questions or concerns.

## 2020-08-26 NOTE — Progress Notes (Signed)
Bedside and Verbal shift change report given to Rana,Rn (oncoming nurse) by Maritza G Aliaga, RN (offgoing nurse). Report included the following information SBAR, Kardex, Intake/Output, MAR and Recent Results.

## 2020-08-27 ENCOUNTER — Inpatient Hospital Stay: Payer: PRIVATE HEALTH INSURANCE | Attending: Obstetrics & Gynecology | Primary: Internal Medicine

## 2020-08-27 ENCOUNTER — Encounter: Primary: Internal Medicine

## 2020-08-27 DIAGNOSIS — M458 Ankylosing spondylitis sacral and sacrococcygeal region: Secondary | ICD-10-CM | POA: Diagnosis not present

## 2020-08-28 ENCOUNTER — Encounter: Admit: 2020-08-28 | Discharge: 2020-08-28 | Payer: PRIVATE HEALTH INSURANCE | Primary: Internal Medicine

## 2020-08-29 ENCOUNTER — Encounter: Admit: 2020-08-29 | Discharge: 2020-08-29 | Payer: PRIVATE HEALTH INSURANCE | Primary: Internal Medicine

## 2020-08-29 NOTE — Home Health (Signed)
Skilled reason for visit: post op following laminectomy    Caregiver involvement: na.    Medications reviewed and all medications are available in the home this visit.    The following education was provided regarding medications: MD notified of any discrepancies/look a-like medications/medication interactions: Tall Man lettering is an error-prevention strategy used to reduce the risk of look-alike medicine names errors. Tall Man lettering uses a combination of lower and upper case letters to highlight the differences between look-alike medicine names, helping to make them more easily distinguishable. pt also  encouraged to  review new prescription instructions with patients before taking, take the exact dose prescribed and using any measuring device that comes with liquid medications.  Medications are effective  at this time.      Home health supplies by type and quantity ordered/delivered this visit include: none    Patient education provided this visit: medication teaching  safety and fall prevention education  diet education  incision care and assessment  pain management      Sharps education provided: na    Patient level of understanding of education provided: pt verbalizes understanding of all education provided during visit via teachback method       Skilled Care Performed this visit: assessed surgical site for s/s of infection. patient education on proper incision care and s/s of infection.    Patient response to procedure performed:  pt tolerated without complaints of pain      Agency Progress toward goals:patient made aware to monitor for s/s of infection [increased swelling, increased redness around site, increased pain, foul smelling drainage, fever] aware who to report to/when.      Patient's Progress towards personal goals: remain out of hospital and free of infection      Home exercise program: No lifting greater than 8 lbs for the first 2 wee ks post op.    Do not bend over to pick up objects.  Use  the squat, lift method or use a reach stick.  Patient should do ankle pump exercises.    Patient is able to shower on post op day # 3 with the incision covered.  May change dressing after shower if the dressing is not water-proof.  NO TUB BATHS FOR 6 WEEKS POST OP.   Progress patient from walker to cane or to independent ambulation as tolerated. No driving until re-evaluated at 2 week post op appointment.     Continued need for the following skills: Nursing and Physical Therapy    Plan for next visit: dressing change/assess surgical site for s/s of infection     Patient and/or caregiver notified and agrees to changes in the Plan of Care N/A      The following discharge planning was discussed with the pt/caregiver: na

## 2020-09-01 ENCOUNTER — Encounter: Admit: 2020-09-01 | Discharge: 2020-09-01 | Payer: PRIVATE HEALTH INSURANCE | Primary: Internal Medicine

## 2020-09-01 NOTE — Home Health (Signed)
Skilled reason for visit: 55 year old being seen due to recent L4/L5 surgery and states she is doing better at this time.  She states that she is still having some weakness in left leg at times, but improving.  Dressing changed to lower back incision-no signs of infection noted, non-adherant pad and transparent dressing applied.  Patient tolerated well without complaints of pain or discomfort.  Patient has follow up appointment with Dr. Carney Living on 09/06/20.    Caregiver involvement: Daughter involved in care-able to assist with ADL's, medications, meal preparation, transportation to MD appointments.    Medications reviewed and all medications are available in the home this visit.    The following education was provided regarding medications:  side effects, dosages, purposes, frequencies.    MD notified of any discrepancies/look a-like medications/medication interactions: No issues found.  Medications are effective at this time.      Home health supplies by type and quantity ordered/delivered this visit include: Used car stock.    Patient education provided this visit:   INSTRUCTED PATIENT AND CG THAT SHOULD ANY NEEDS OR CONCERNS ARISE TO FIRST CALL OUR OFFICE, OR THE DR'S OFFICE  OR GO TO AN URGENT CARE CENTER AND NOT TO THE ED FOR NON-LIFE THREATENING EVENTS. IF IT IS LIFE THREATENING THEN CALL 911 OR GO TO THE CLOSEST ER.  Patient is a fall risk. Educated pateint to sit on the side of the chair/bed, take a slow deep breaths, have feet firmly planted before standing up, use cane/walker if available, or have someone to assist.  patient made aware to monitor for s/s of infection [increased swelling, increased redness around site, increased pain, foul smelling drainage, fever] aware who to report to/when.  pt aware to keep dressing clean, dry and intact as ordered.  encouraged patient to get three nutritional meals daily and to stay hydrated, drink plenty of fluids.  patient made aware to monitor for s/s of infection  [increased swelling, increased redness around site, increased pain, foul smelling drainage, fever] aware who to report to/when.  pt aware to keep incision clean and dry as ordered, to report any new drainage to staff.    Sharps education provided: Not applicable.    Patient level of understanding of education provided: Good, verbalized understanding.    Skilled Care Performed this visit: Education and review, dressing change to lumbar incision.    Patient response to procedure performed:  Tolerated well, no complaints of pain or discomfort.    Agency Progress toward goals: Progressing well, complying with plan of care.    Patient's Progress towards personal goals: Progressing well, complying with plan of care.    Home exercise program: Take medications as prescribed, monitor for signs of infection, keep dressing clean and dry, keep all MD appointments.    Continued need for the following skills: Nursing    Plan for next visit: Education and review, discharge.    Patient and/or caregiver notified and agrees to changes in the Plan of Care YES      The following discharge planning was discussed with the pt/caregiver: Patient will be discharged once education has completed, patient is medically stable and pt/cg are able to independently manage wound care/ wound has healed or no longer requires skilled care.

## 2020-09-09 ENCOUNTER — Encounter: Primary: Internal Medicine

## 2020-09-10 ENCOUNTER — Encounter: Admit: 2020-09-10 | Discharge: 2020-09-10 | Payer: PRIVATE HEALTH INSURANCE | Primary: Internal Medicine

## 2020-09-10 NOTE — Home Health (Signed)
Reviewed with patient /PCG s/s of disease exacerbation that need to be reported to health care providers including steps on what to do in an event of an emergency. Medication pill box set up checked and emphasized the importance of timely refill of medications to prevent missing or skipping doses, pain management, continue following prescribed diet regimen. Re-instructed on infection control measures and practicing standard precautions, most importantly, frequent proper hand washing to prevent disease complications.  medications reconciled  Pt.clinically discharged and documentation finalized for completion of agency discharge.

## 2020-09-16 ENCOUNTER — Other Ambulatory Visit: Payer: Self-pay | Admitting: Obstetrics & Gynecology

## 2020-09-16 DIAGNOSIS — Z1231 Encounter for screening mammogram for malignant neoplasm of breast: Secondary | ICD-10-CM

## 2020-09-25 ENCOUNTER — Ambulatory Visit
Admit: 2020-09-25 | Discharge: 2020-09-25 | Payer: PRIVATE HEALTH INSURANCE | Attending: Sports Medicine | Primary: Internal Medicine

## 2020-09-25 ENCOUNTER — Ambulatory Visit: Attending: Sports Medicine | Primary: Internal Medicine

## 2020-09-25 DIAGNOSIS — M7711 Lateral epicondylitis, right elbow: Secondary | ICD-10-CM

## 2020-09-25 MED ORDER — MELOXICAM 15 MG TAB
15 mg | ORAL_TABLET | ORAL | 0 refills | Status: AC
Start: 2020-09-25 — End: ?

## 2020-09-25 NOTE — Progress Notes (Signed)
HISTORY OF PRESENT ILLNESS    [name] [name] Jan 18, 1966 is a 55 y.o. year old female comes in today to be evaluated and treated for: right elbow pain    Since last appt has noticed improvement after injected steroid [id number] but flared the last 1-2 weeks. Pain level 4/10. Using mobic with benefit. Brace use restarted but minimal stretch prior.    Past Surgical History:   Procedure Laterality Date   ??? COLONOSCOPY N/A 01/25/2020    COLONOSCOPY performed by Ihor Austin, MD at [facility] ENDOSCOPY   ??? HX APPENDECTOMY  2006   ??? HX BACK SURGERY     ??? HX CESAREAN SECTION  1987, 1994, 1998   ??? HX CHOLECYSTECTOMY  1998   ??? HX HYSTERECTOMY      uterus removed   ??? HX HYSTERECTOMY  2012     Social History     Socioeconomic History   ??? Marital status: MARRIED   Tobacco Use   ??? Smoking status: Never Smoker   ??? Smokeless tobacco: Never Used   Vaping Use   ??? Vaping Use: Never used   Substance and Sexual Activity   ??? Alcohol use: Not Currently     Alcohol/week: 1.0 standard drink     Types: 1 Glasses of wine per week   ??? Drug use: No   ??? Sexual activity: Yes     Partners: Male     Birth control/protection: Surgical   Other Topics Concern   ??? Weight Concern Yes     Current Outpatient Medications   Medication Sig Dispense Refill   ??? cephALEXin (KEFLEX) 250 mg capsule Take 2 Capsules by mouth four (4) times daily. 28 Capsule 0   ??? pregabalin (Lyrica) 150 mg capsule Take 1 Capsule by mouth two (2) times a day. Max Daily Amount: 300 mg. 60 Capsule 2   ??? amLODIPine (NORVASC) 5 mg tablet Take 5 mg by mouth daily.     ??? multivitamin (ONE A DAY) tablet Take 1 Tab by mouth daily.     ??? albuterol (PROAIR HFA) 90 mcg/actuation inhaler Take 2 Puffs by inhalation every six (6) hours as needed for Wheezing. Indications: ACUTE ASTHMA ATTACK 1 Inhaler 5   ??? cetirizine (ZYRTEC) 10 mg tablet Take 1 Tab by mouth daily. Indications: PERENNIAL ALLERGIC RHINITIS 30 Tab 5   ??? fluticasone (FLONASE) 50 mcg/actuation nasal spray 2 Sprays by Both Nostrils route  daily. Indications: ALLERGIC RHINITIS 1 Bottle 5     Past Medical History:   Diagnosis Date   ??? Asthma    ??? COVID-19 11/2018, 02/2020   ??? H/O seasonal allergies    ??? Headache    ??? Hx of cholecystectomy 1999   ??? Hypertension    ??? Morbid obesity (HCC)    ??? Sleep apnea 07/2020    no cpap     Family History   Problem Relation Age of Onset   ??? Heart Disease Mother    ??? Hypertension Mother    ??? Diabetes Maternal Aunt    ??? Cancer Maternal Grandmother         colon         ROS:  No numb, tingle    Objective:  Pulse 65    Resp 15    Ht 5\' 4"  (1.626 m)    Wt 287 lb (130.2 kg)    SpO2 96%    BMI 49.26 kg/m??   NEURO:  Sensation intact light touch upper and lower extremities.  Biceps & Triceps reflexes +2/4 bilaterally.  right hand dominant. Spurling Negative bilateral   M/S:  right elbow/wrist:  Negative boney tenderness. Phalen's negative.  Tinel's negative.  Strength +5/5 bilateral .  Piano key sign Negative bilateral .  Carpal bone motion normal.  Finklestein's negative  TFCC Load Test negative.  RIGHT medial epicondyle(s) with mild TTP not worsened with wrist extension.   negative muscular atrophy.      Assessment/Plan:     ICD-10-CM ICD-9-CM    1. Lateral epicondylitis, right elbow  M77.11 726.32 meloxicam (MOBIC) 15 mg tablet       Patient (or guardian if minor) verbalizes understanding of evaluation and plan.    Will continue HEP and Rx for mobic as above and plan follow-up 4 weeks, consider steroid/prolo then.

## 2020-09-26 ENCOUNTER — Ambulatory Visit: Payer: Commercial Managed Care - PPO | Admitting: Obstetrics & Gynecology

## 2020-10-01 ENCOUNTER — Other Ambulatory Visit: Payer: Self-pay

## 2020-10-01 ENCOUNTER — Other Ambulatory Visit (HOSPITAL_COMMUNITY)
Admission: RE | Admit: 2020-10-01 | Discharge: 2020-10-01 | Disposition: A | Discharge status: Home / Self Care | Payer: Commercial Managed Care - PPO | Source: Ambulatory Visit | Attending: Obstetrics & Gynecology | Admitting: Obstetrics & Gynecology

## 2020-10-01 ENCOUNTER — Ambulatory Visit (INDEPENDENT_AMBULATORY_CARE_PROVIDER_SITE_OTHER): Payer: Commercial Managed Care - PPO | Admitting: Obstetrics & Gynecology

## 2020-10-01 ENCOUNTER — Encounter: Payer: Self-pay | Admitting: Obstetrics & Gynecology

## 2020-10-01 VITALS — BP 110/72 | HR 72 | Resp 16 | Ht 60.0 in | Wt 158.0 lb

## 2020-10-01 DIAGNOSIS — E6609 Other obesity due to excess calories: Secondary | ICD-10-CM

## 2020-10-01 DIAGNOSIS — Z01419 Encounter for gynecological examination (general) (routine) without abnormal findings: Secondary | ICD-10-CM | POA: Diagnosis present

## 2020-10-01 DIAGNOSIS — Z90710 Acquired absence of both cervix and uterus: Secondary | ICD-10-CM

## 2020-10-01 DIAGNOSIS — Z1272 Encounter for screening for malignant neoplasm of vagina: Secondary | ICD-10-CM | POA: Diagnosis present

## 2020-10-01 DIAGNOSIS — N890 Mild vaginal dysplasia: Secondary | ICD-10-CM

## 2020-10-01 DIAGNOSIS — Z780 Asymptomatic menopausal state: Secondary | ICD-10-CM

## 2020-10-01 DIAGNOSIS — Z6830 Body mass index (BMI) 30.0-30.9, adult: Secondary | ICD-10-CM

## 2020-10-01 NOTE — Progress Notes (Signed)
Natalie Martinez 1965-06-01 [id number]   History:    55 y.o.  G3P3L3 Single   RP:  Established patient presenting for annual gyn exam   HPI:  S/P Total Hysterectomy for Dysplasia, no residual on patho.  No pelvic pain.  Normal vaginal secretions.  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  No pain with IC. Breasts wnl. BMI 31.76.  Health labs with Fam MD. Harriet Masson 2021.     Past medical history,surgical history, family history and social history were all reviewed and documented in the EPIC chart.  Gynecologic History No LMP recorded. Patient has had a hysterectomy.  Obstetric History OB History  Gravida Para Term Preterm AB Living  3 3 3     3   SAB IAB Ectopic Multiple Live Births          3    # Outcome Date GA Lbr Len/2nd Weight Sex Delivery Anes PTL Lv  3 Term     F Vag-Spont  N LIV  2 Term     F Vag-Spont  N DEC  1 Term     M Vag-Spont  N LIV     ROS: A ROS was performed and pertinent positives and negatives are included in the history.  GENERAL: No fevers or chills. HEENT: No change in vision, no earache, sore throat or sinus congestion. NECK: No pain or stiffness. CARDIOVASCULAR: No chest pain or pressure. No palpitations. PULMONARY: No shortness of breath, cough or wheeze. GASTROINTESTINAL: No abdominal pain, nausea, vomiting or diarrhea, melena or bright red blood per rectum. GENITOURINARY: No urinary frequency, urgency, hesitancy or dysuria. MUSCULOSKELETAL: No joint or muscle pain, no back pain, no recent trauma. DERMATOLOGIC: No rash, no itching, no lesions. ENDOCRINE: No polyuria, polydipsia, no heat or cold intolerance. No recent change in weight. HEMATOLOGICAL: No anemia or easy bruising or bleeding. NEUROLOGIC: No headache, seizures, numbness, tingling or weakness. PSYCHIATRIC: No depression, no loss of interest in normal activity or change in sleep pattern.     Exam:   BP 110/72   Pulse 72   Resp 16   Ht 5' (1.524 m)   Wt 158 lb (71.7 kg)    BMI 30.86 kg/m   Body mass index is 30.86 kg/m.  General appearance : Well developed well nourished female. No acute distress HEENT: Eyes: no retinal hemorrhage or exudates,  Neck supple, trachea midline, no carotid bruits, no thyroidmegaly Lungs: Clear to auscultation, no rhonchi or wheezes, or rib retractions  Heart: Regular rate and rhythm, no murmurs or gallops Breast:Examined in sitting and supine position were symmetrical in appearance, no palpable masses or tenderness,  no skin retraction, no nipple inversion, no nipple discharge, no skin discoloration, no axillary or supraclavicular lymphadenopathy Abdomen: no palpable masses or tenderness, no rebound or guarding Extremities: no edema or skin discoloration or tenderness  Pelvic: Vulva: Normal             Vagina: No gross lesions or discharge.  Pap reflex done.  Cervix/Uterus absent  Adnexa  Without masses or tenderness  Anus: Normal.  Rectal exam with valsalva, no anal prolapse.   Assessment/Plan:  55 y.o. female for annual exam   1. Encounter for Papanicolaou smear of vagina as part of routine gynecological examination Gynecologic exam status post total hysterectomy.  History of VAIN 1.  Pap reflex done at the vaginal vault.  Breast exam normal.  We will schedule a screening mammogram now.  Colonoscopy January 2021.  Health labs  with family physician. - Cytology - PAP( [employer])  2. VAIN I (vaginal intraepithelial neoplasia grade I) - Cytology - PAP( [place])  3. History of total hysterectomy  4. Postmenopause Well on no hormone replacement therapy.  Vitamin D supplements, calcium intake of 1.5 g/day total and regular weightbearing physical activities to continue.  5. Class 1 obesity due to excess calories without serious comorbidity with body mass index (BMI) of 30.0 to 30.9 in adult Recommend a low calorie/carb diet.  Aerobic activities 5 times a week and light weightlifting every 2 days.  Other orders -  UNABLE TO FIND; Med Name: medicine for arthritis   Princess Bruins MD, 4:40 PM 10/01/2020

## 2020-10-04 LAB — CYTOLOGY - PAP: Diagnosis: NEGATIVE

## 2020-10-06 ENCOUNTER — Encounter: Payer: Self-pay | Admitting: Obstetrics & Gynecology

## 2020-10-07 LAB — HEMOGLOBIN A1C
Estimated Avg Glucose, External: 123 mg/dL (ref 91–123)
Hemoglobin A1C, External: 5.9 % — ABNORMAL HIGH (ref 4.8–5.6)

## 2020-10-12 ENCOUNTER — Inpatient Hospital Stay
Payer: PRIVATE HEALTH INSURANCE | Attending: Student in an Organized Health Care Education/Training Program | Primary: Internal Medicine

## 2020-10-19 ENCOUNTER — Other Ambulatory Visit: Payer: Self-pay | Admitting: Family Medicine

## 2020-10-19 DIAGNOSIS — E559 Vitamin D deficiency, unspecified: Secondary | ICD-10-CM

## 2020-10-23 ENCOUNTER — Encounter: Attending: Sports Medicine | Primary: Internal Medicine

## 2020-11-04 ENCOUNTER — Other Ambulatory Visit: Payer: Self-pay

## 2020-11-05 ENCOUNTER — Ambulatory Visit (INDEPENDENT_AMBULATORY_CARE_PROVIDER_SITE_OTHER): Payer: BC Managed Care – PPO | Admitting: Family Medicine

## 2020-11-05 ENCOUNTER — Encounter: Payer: Self-pay | Admitting: Family Medicine

## 2020-11-05 VITALS — BP 120/74 | HR 64 | Temp 97.4°F | Ht 60.0 in | Wt 157.2 lb

## 2020-11-05 DIAGNOSIS — E559 Vitamin D deficiency, unspecified: Secondary | ICD-10-CM | POA: Diagnosis not present

## 2020-11-05 DIAGNOSIS — R82998 Other abnormal findings in urine: Secondary | ICD-10-CM

## 2020-11-05 DIAGNOSIS — Z23 Encounter for immunization: Secondary | ICD-10-CM | POA: Diagnosis not present

## 2020-11-05 DIAGNOSIS — E7800 Pure hypercholesterolemia, unspecified: Secondary | ICD-10-CM

## 2020-11-05 DIAGNOSIS — L608 Other nail disorders: Secondary | ICD-10-CM | POA: Diagnosis not present

## 2020-11-05 DIAGNOSIS — Z0000 Encounter for general adult medical examination without abnormal findings: Secondary | ICD-10-CM

## 2020-11-05 DIAGNOSIS — S86912A Strain of unspecified muscle(s) and tendon(s) at lower leg level, left leg, initial encounter: Secondary | ICD-10-CM | POA: Insufficient documentation

## 2020-11-05 LAB — URINALYSIS, ROUTINE W REFLEX MICROSCOPIC
Bilirubin Urine: NEGATIVE
Ketones, ur: NEGATIVE
Leukocytes,Ua: NEGATIVE
Nitrite: NEGATIVE
Specific Gravity, Urine: 1.025 (ref 1.000–1.030)
Total Protein, Urine: NEGATIVE
Urine Glucose: NEGATIVE
Urobilinogen, UA: 0.2 (ref 0.0–1.0)
pH: 5.5 (ref 5.0–8.0)

## 2020-11-05 LAB — CBC
HCT: 40.6 % (ref 36.0–46.0)
Hemoglobin: 13.7 g/dL (ref 12.0–15.0)
MCHC: 33.7 g/dL (ref 30.0–36.0)
MCV: 94.4 fl (ref 78.0–100.0)
Platelets: 231 10*3/uL (ref 150.0–400.0)
RBC: 4.3 Mil/uL (ref 3.87–5.11)
RDW: 12.8 % (ref 11.5–15.5)
WBC: 5.1 10*3/uL (ref 4.0–10.5)

## 2020-11-05 LAB — COMPREHENSIVE METABOLIC PANEL
ALT: 17 U/L (ref 0–35)
AST: 20 U/L (ref 0–37)
Albumin: 4.4 g/dL (ref 3.5–5.2)
Alkaline Phosphatase: 75 U/L (ref 39–117)
BUN: 14 mg/dL (ref 6–23)
CO2: 31 mEq/L (ref 19–32)
Calcium: 10.1 mg/dL (ref 8.4–10.5)
Chloride: 103 mEq/L (ref 96–112)
Creatinine, Ser: 0.67 mg/dL (ref 0.40–1.20)
GFR: 100.22 mL/min (ref >60.00)
Glucose, Bld: 85 mg/dL (ref 70–99)
Potassium: 4.3 mEq/L (ref 3.5–5.1)
Sodium: 141 mEq/L (ref 135–145)
Total Bilirubin: 0.9 mg/dL (ref 0.2–1.2)
Total Protein: 7.4 g/dL (ref 6.0–8.3)

## 2020-11-05 LAB — VITAMIN D 25 HYDROXY (VIT D DEFICIENCY, FRACTURES): VITD: 55.86 ng/mL (ref 30.00–100.00)

## 2020-11-05 LAB — LIPID PANEL
Cholesterol: 241 mg/dL — ABNORMAL HIGH (ref 0–200)
HDL: 45.6 mg/dL (ref >39.00)
NonHDL: 195
Total CHOL/HDL Ratio: 5
Triglycerides: 267 mg/dL — ABNORMAL HIGH (ref 0.0–149.0)
VLDL: 53.4 mg/dL — ABNORMAL HIGH (ref 0.0–40.0)

## 2020-11-05 LAB — LDL CHOLESTEROL, DIRECT: Direct LDL: 152 mg/dL

## 2020-11-05 LAB — URIC ACID: Uric Acid, Serum: 4.2 mg/dL (ref 2.4–7.0)

## 2020-11-05 NOTE — Progress Notes (Signed)
Established Patient Office Visit  Subjective:  Patient ID: Natalie Martinez, female    DOB: 08-14-1965  Age: 55 y.o. MRN: [id number]  CC:  Chief Complaint  Patient presents with   Annual Exam    CPE, no concern patient fasting.     HPI Natalie Martinez presents for health check and physical.  Has been doing relatively well.  Continues Zocor for elevated cholesterol.  She is fasting this morning.  She is also pends drinking a juice from cactus sleeps to help lower the cholesterol.  Has a mammogram scheduled later this month.  Well woman care is through GYN.  She is concerned about her left great toenail and muscle tenderness lower leg.  There has been no injury.  She uses herbal teas for insomnia.  She also takes melatonin from time to time.  These of been sufficient for her.  Past Medical History:  Diagnosis Date   Allergy    Arthritis    Hypercholesterolemia    Neuromuscular disorder ([place])    sciatic pain    NSVD (normal spontaneous vaginal delivery)    X3   Vertigo     Past Surgical History:  Procedure Laterality Date   CERVICAL BIOPSY  W/ LOOP ELECTRODE EXCISION  2004  &  2005   X 2   DILATION AND CURETTAGE OF UTERUS  2000 & 2003   VAGINAL HYSTERECTOMY  2005   RECURRENT CERVICAL DYSPLASIA    Family History  Problem Relation Age of Onset   Diabetes Mother    Healthy Sister    Healthy Brother    Healthy Brother    Breast cancer Neg Hx    Colon cancer Neg Hx    Colon polyps Neg Hx    Esophageal cancer Neg Hx    Rectal cancer Neg Hx    Stomach cancer Neg Hx     Social History   Socioeconomic History   Marital status: Single    Spouse name: Not on file   Number of children: 3   Years of education: 15   Highest education level: Not on file  Occupational History   Occupation: Glass blower/designer  Tobacco Use   Smoking status: Never   Smokeless tobacco: Never  Vaping Use   Vaping Use: Never used  Substance and Sexual Activity   Alcohol use: No     Alcohol/week: 0.0 standard drinks   Drug use: No   Sexual activity: Not Currently    Birth control/protection: None, Surgical    Comment: 1st intercourse- 25, partners- 3,   Other Topics Concern   Not on file  Social History Narrative   Lives at home with her daughter and her mother.   Occasional caffeine use (coffee twice weekly).   Right-handed.   Social Determinants of Health   Financial Resource Strain: Not on file  Food Insecurity: Not on file  Transportation Needs: Not on file  Physical Activity: Not on file  Stress: Not on file  Social Connections: Not on file  Intimate Partner Violence: Not on file    Outpatient Medications Prior to Visit  Medication Sig Dispense Refill   diclofenac (VOLTAREN) 75 MG EC tablet Take 75 mg by mouth 2 (two) times daily.     simvastatin (ZOCOR) 40 MG tablet Take one daily at night. 90 tablet 4   Vitamin D, Ergocalciferol, (DRISDOL) 1.25 MG (50000 UNIT) CAPS capsule TAKE 1 CAPSULE BY MOUTH EVERY 7 DAYS 11 capsule 0   UNABLE TO FIND Med  Name: medicine for arthritis (Patient not taking: Reported on 11/05/2020)     No facility-administered medications prior to visit.    No Known Allergies  ROS Review of Systems  Constitutional: Negative.   HENT: Negative.    Eyes:  Negative for photophobia and visual disturbance.  Respiratory: Negative.    Cardiovascular: Negative.   Gastrointestinal: Negative.   Endocrine: Negative for polyphagia and polyuria.  Genitourinary: Negative.   Musculoskeletal:  Positive for myalgias.  Skin: Negative.   Neurological:  Negative for seizures and weakness.  Psychiatric/Behavioral:  Positive for sleep disturbance. Negative for dysphoric mood. The patient is not nervous/anxious.      Objective:    Physical Exam Vitals and nursing note reviewed.  Constitutional:      General: She is not in acute distress.    Appearance: Normal appearance. She is not ill-appearing, toxic-appearing or diaphoretic.  HENT:      Head: Normocephalic and atraumatic.     Right Ear: Tympanic membrane, ear canal and external ear normal.     Left Ear: Tympanic membrane, ear canal and external ear normal.     Mouth/Throat:     Mouth: Mucous membranes are moist.     Pharynx: Oropharynx is clear. No oropharyngeal exudate or posterior oropharyngeal erythema.  Eyes:     General: No scleral icterus.       Right eye: No discharge.        Left eye: No discharge.     Extraocular Movements: Extraocular movements intact.     Conjunctiva/sclera: Conjunctivae normal.     Pupils: Pupils are equal, round, and reactive to light.  Neck:     Vascular: No carotid bruit.  Cardiovascular:     Rate and Rhythm: Normal rate and regular rhythm.  Pulmonary:     Effort: Pulmonary effort is normal.     Breath sounds: Normal breath sounds.  Abdominal:     General: Bowel sounds are normal.  Musculoskeletal:     Cervical back: No rigidity or tenderness.     Right lower leg: No edema.     Left lower leg: No swelling. No edema.  Lymphadenopathy:     Cervical: No cervical adenopathy.  Skin:    General: Skin is warm and dry.       Neurological:     Mental Status: She is alert and oriented to person, place, and time.  Psychiatric:        Mood and Affect: Mood normal.        Behavior: Behavior normal.    BP 120/74 (BP Location: Right Arm, Patient Position: Sitting, Cuff Size: Normal)   Pulse 64   Temp (!) 97.4 F (36.3 C) (Temporal)   Ht 5' (1.524 m)   Wt 157 lb 3.2 oz (71.3 kg)   SpO2 98%   BMI 30.70 kg/m  Wt Readings from Last 3 Encounters:  11/05/20 157 lb 3.2 oz (71.3 kg)  10/01/20 158 lb (71.7 kg)  01/01/20 164 lb 6.4 oz (74.6 kg)     Health Maintenance Due  Topic Date Due   INFLUENZA VACCINE  10/21/2020    There are no preventive care reminders to display for this patient.  Lab Results  Component Value Date   TSH 1.70 06/21/2017   Lab Results  Component Value Date   WBC 5.2 01/10/2020   HGB 13.6  01/10/2020   HCT 40.7 01/10/2020   MCV 95.0 01/10/2020   PLT 245.0 01/10/2020   Lab Results  Component Value Date  NA 140 01/10/2020   K 4.6 01/10/2020   CO2 30 01/10/2020   GLUCOSE 81 01/10/2020   BUN 10 01/10/2020   CREATININE 0.68 01/10/2020   BILITOT 0.5 01/10/2020   ALKPHOS 68 01/10/2020   AST 20 01/10/2020   ALT 20 01/10/2020   PROT 6.8 01/10/2020   ALBUMIN 4.3 01/10/2020   CALCIUM 9.6 01/10/2020   GFR 100.58 01/10/2020   Lab Results  Component Value Date   CHOL 185 01/10/2020   Lab Results  Component Value Date   HDL 42.20 01/10/2020   Lab Results  Component Value Date   LDLCALC 105 (H) 01/10/2020   Lab Results  Component Value Date   TRIG 189.0 (H) 01/10/2020   Lab Results  Component Value Date   CHOLHDL 4 01/10/2020   No results found for: HGBA1C    Assessment & Plan:   Problem List Items Addressed This Visit       Musculoskeletal and Integument   Nail deformity   Relevant Orders   Ambulatory referral to Podiatry   Muscle strain of left lower leg     Other   Hypercholesterolemia   Relevant Orders   Comprehensive metabolic panel   Lipid panel   Healthcare maintenance - Primary   Relevant Orders   CBC   Urinalysis, Routine w reflex microscopic   Varicella-zoster vaccine IM (Shingrix) (Completed)   Vitamin D deficiency   Relevant Orders   VITAMIN D 25 Hydroxy (Vit-D Deficiency, Fractures)   Uricosuria   Relevant Orders   Uric acid    No orders of the defined types were placed in this encounter.   Follow-up: Return Return in 2-6 months for repeat Zovirax vaccine..  Given information on health maintenance and disease prevention.  Also given information on hyper cholesterolemia.  Also given information on the Shingrix vaccine which she is receiving today.  Would like to go see a podiatrist about the deformity of her left great toenail.  Return to the clinic if the discomfort in the left lower leg does not improve on its own.  Libby Maw, MD

## 2020-11-06 DIAGNOSIS — Z20822 Contact with and (suspected) exposure to covid-19: Secondary | ICD-10-CM | POA: Diagnosis not present

## 2020-11-06 NOTE — Progress Notes (Signed)
Ldl cholesterol is higher than before. It appears as though you may be taking the cactus extract in place of the simvastatin. If this is the case please take your simvastatin every day. If this is not the case, please let me know.

## 2020-11-11 ENCOUNTER — Ambulatory Visit
Admission: RE | Admit: 2020-11-11 | Discharge: 2020-11-11 | Disposition: A | Discharge status: Home / Self Care | Payer: BC Managed Care – PPO | Source: Ambulatory Visit | Attending: Obstetrics & Gynecology | Admitting: Obstetrics & Gynecology

## 2020-11-11 ENCOUNTER — Other Ambulatory Visit: Payer: Self-pay

## 2020-11-11 DIAGNOSIS — Z1231 Encounter for screening mammogram for malignant neoplasm of breast: Secondary | ICD-10-CM

## 2020-11-13 ENCOUNTER — Telehealth: Payer: Self-pay

## 2020-11-13 DIAGNOSIS — E7800 Pure hypercholesterolemia, unspecified: Secondary | ICD-10-CM

## 2020-11-13 NOTE — Telephone Encounter (Signed)
Spoke with patient regarding elevated cholesterol levels per patient she is currently taking both cactus extract and her simvastatin daily. Please advise.

## 2020-11-14 LAB — HEMOGLOBIN A1C
Estimated Avg Glucose, External: 112 mg/dL (ref 91–123)
Hemoglobin A1C, External: 5.5 % (ref 4.8–5.6)

## 2020-11-14 MED ORDER — ATORVASTATIN CALCIUM 40 MG PO TABS: 40.0000 mg | ORAL_TABLET | Freq: Every day | ORAL | 1 refills | Status: DC

## 2020-11-14 NOTE — Telephone Encounter (Signed)
Patient aware and will discontinue both and start on new Rx that was sent in.

## 2020-11-18 ENCOUNTER — Ambulatory Visit: Payer: BC Managed Care – PPO | Admitting: Podiatry

## 2020-11-18 ENCOUNTER — Other Ambulatory Visit: Payer: Self-pay

## 2020-11-18 DIAGNOSIS — B351 Tinea unguium: Secondary | ICD-10-CM

## 2020-11-18 DIAGNOSIS — M79675 Pain in left toe(s): Secondary | ICD-10-CM | POA: Diagnosis not present

## 2020-11-18 DIAGNOSIS — M79674 Pain in right toe(s): Secondary | ICD-10-CM | POA: Diagnosis not present

## 2020-11-18 MED ORDER — TERBINAFINE HCL 250 MG PO TABS: 250.0000 mg | ORAL_TABLET | Freq: Every day | ORAL | 0 refills | Status: DC

## 2020-11-28 ENCOUNTER — Ambulatory Visit
Admit: 2020-11-28 | Discharge: 2020-11-28 | Payer: PRIVATE HEALTH INSURANCE | Attending: Sports Medicine | Primary: Internal Medicine

## 2020-11-28 ENCOUNTER — Ambulatory Visit: Attending: Sports Medicine | Primary: Internal Medicine

## 2020-11-28 DIAGNOSIS — M7711 Lateral epicondylitis, right elbow: Secondary | ICD-10-CM

## 2020-11-28 MED ORDER — LIDOCAINE 5 % TOPICAL OINTMENT
5 % | CUTANEOUS | 2 refills | Status: AC | PRN
Start: 2020-11-28 — End: ?

## 2020-11-28 MED ORDER — METHYLPREDNISOLONE 40 MG/ML SUSP FOR INJECTION
40 mg/mL | Freq: Once | INTRAMUSCULAR | Status: AC
Start: 2020-11-28 — End: 2020-11-28
  Administered 2020-11-28: 21:00:00 via INTRABURSAL

## 2020-11-28 NOTE — Progress Notes (Signed)
HISTORY OF PRESENT ILLNESS    [name] [name] 21-Oct-1965 is a 55 y.o. year old female comes in today to be evaluated and treated for: right elbow pain    Since last appt has noticed pain significant despite brace and stretch. Pain level 8/10. Using mobic without benefit. Injected [id number] w/ benefit.    Past Surgical History:   Procedure Laterality Date    COLONOSCOPY N/A 01/25/2020    COLONOSCOPY performed by Ihor Austin, MD at [facility] ENDOSCOPY    HX APPENDECTOMY  2006    HX BACK SURGERY      HX CESAREAN SECTION  1987, 1994, 48    HX CHOLECYSTECTOMY  1998    HX HYSTERECTOMY      uterus removed    HX HYSTERECTOMY  2012     Social History     Socioeconomic History    Marital status: MARRIED   Tobacco Use    Smoking status: Never    Smokeless tobacco: Never   Vaping Use    Vaping Use: Never used   Substance and Sexual Activity    Alcohol use: Not Currently     Alcohol/week: 1.0 standard drink     Types: 1 Glasses of wine per week    Drug use: No    Sexual activity: Yes     Partners: Male     Birth control/protection: Surgical   Other Topics Concern    Weight Concern Yes     Current Outpatient Medications   Medication Sig Dispense Refill    metFORMIN (GLUCOPHAGE) 500 mg tablet Take 500 mg by mouth daily (with breakfast).      phentermine (ADIPEX-P) 37.5 mg tablet Take 37.5 mg by mouth every morning.      meloxicam (MOBIC) 15 mg tablet Take 1 tab daily as needed pain with food. 90 Tablet 0    amLODIPine (NORVASC) 5 mg tablet Take 10 mg by mouth daily.      multivitamin (ONE A DAY) tablet Take 1 Tab by mouth daily.      albuterol (PROAIR HFA) 90 mcg/actuation inhaler Take 2 Puffs by inhalation every six (6) hours as needed for Wheezing. Indications: ACUTE ASTHMA ATTACK 1 Inhaler 5    cetirizine (ZYRTEC) 10 mg tablet Take 1 Tab by mouth daily. Indications: PERENNIAL ALLERGIC RHINITIS 30 Tab 5    fluticasone (FLONASE) 50 mcg/actuation nasal spray 2 Sprays by Both Nostrils route daily. Indications: ALLERGIC RHINITIS 1  Bottle 5    pregabalin (Lyrica) 150 mg capsule Take 1 Capsule by mouth two (2) times a day. Max Daily Amount: 300 mg. (Patient not taking: Reported on 11/28/2020) 60 Capsule 2     Past Medical History:   Diagnosis Date    Asthma     COVID-19 11/2018, 02/2020    H/O seasonal allergies     Headache     Hx of cholecystectomy 1999    Hypertension     Morbid obesity (HCC)     Sleep apnea 07/2020    no cpap     Family History   Problem Relation Age of Onset    Heart Disease Mother     Hypertension Mother     Diabetes Maternal Aunt     Cancer Maternal Grandmother         colon         ROS:  No numb, tingle    Objective:  Pulse 96    Resp 14    Ht 5\' 4"  (1.626 m)  Wt 276 lb (125.2 kg)    SpO2 97%    BMI 47.38 kg/m??   NEURO:  Sensation intact light touch upper and lower extremities.  Biceps & Triceps reflexes +2/4 bilaterally.  right hand dominant. Spurling Negative bilateral   M/S:  right elbow/wrist:  Negative boney tenderness. Phalen's negative.  Tinel's negative.  Strength +5/5 bilateral .  Piano key sign Negative bilateral .  Carpal bone motion normal.  Finklestein's negative  TFCC Load Test negative.  RIGHT medial epicondyle(s) with TTP not worsened with wrist extension.   negative muscular atrophy.    Assessment/Plan:     ICD-10-CM ICD-9-CM    1. Lateral epicondylitis, right elbow  M77.11 726.32 AMB POC [place], SONO GUIDE NEEDLE      lidocaine (XYLOCAINE) 5 % ointment      PR INJECT TENDON ORIGIN/INSERT      2. Elbow pain, right  M25.521 719.42           Patient (or guardian if minor) verbalizes understanding of evaluation and plan.    Will inject lateral epicondylitis w/ depo-medrol and use wrist brace as above and plan follow-up 6 weeks. Consider D25 then.

## 2020-11-28 NOTE — Procedures (Signed)
PROCEDURE NOTE:  Time out: 406pm  * Patient was identified by name and date of birth   * Agreement on procedure being performed was verified  * Risks and Benefits explained to the patient  * Procedure site verified and marked as necessary  * Patient was positioned for comfort  * Consent was signed and verified.  Risks/benefits including but not limited to bleeding, infection, and scarring discussed and Pt wishes to proceed with procedure.    The area was prepped with betadine.  Ethyl chloride spray was used. Under sterile technique with ultrasound guidance using Terason uSmart 3200T with 4-15 MHz linear transducer. Indication for ultrasound guidance habitus.   1cc of 40mg /cc methylprednisolone acetate  and 1cc lidocaine were injected into point of maximal tenderness of right elbow lateral epicondyle tendon origin using distal approach.    Sterile gauze used to clean the area.  Blood loss minimal.  Noticed improvement in pain Sx within 5 minutes (now rated 1/10).    Tolerated procedure well.  Discussed possible signs/Sx of infxn, and advised to seek care if concerned.    Ultrasound images (if applicable) digitally attached to CPT order in patients chart.

## 2020-11-28 NOTE — Addendum Note (Signed)
Addendum Note by Dow Adolph, LPN at 40/34/74 1530                Author: Dow Adolph, LPN  Service: --  Author Type: Licensed Nurse       Filed: 11/28/20 1646  Encounter Date: 11/28/2020  Status: Signed          Editor: Dow Adolph, LPN (Licensed Nurse)          Addended by: Dow Adolph on: 11/28/2020 04:46 PM    Modules accepted: Orders

## 2020-12-02 NOTE — Progress Notes (Signed)
   Subjective: 55 y.o. female presenting today as a new patient for evaluation of a toenail problem to the bilateral great toes this been present for about 3 to 4 months now.  Sudden onset.  Patient states that she has noticed nail discoloration with thickening.  She is concerned for toenail fungus.  She is wondering if the nails need to be removed.  She presents for further treatment and evaluation  Past Medical History:  Diagnosis Date   Allergy    Arthritis    Hypercholesterolemia    Neuromuscular disorder ([place])    sciatic pain    NSVD (normal spontaneous vaginal delivery)    X3   Vertigo     Objective: Physical Exam General: The patient is alert and oriented x3 in no acute distress.  Dermatology: Hyperkeratotic, discolored, thickened, onychodystrophy noted bilateral great toenails. Skin is warm, dry and supple bilateral lower extremities. Negative for open lesions or macerations.  Vascular: Palpable pedal pulses bilaterally. No edema or erythema noted. Capillary refill within normal limits.  Neurological: Epicritic and protective threshold grossly intact bilaterally.   Musculoskeletal Exam: Range of motion within normal limits to all pedal and ankle joints bilateral. Muscle strength 5/5 in all groups bilateral.   Assessment: #1 Onychomycosis of toenails  Plan of Care:  #1 Patient was evaluated. #2  Today we discussed different treatment options including oral, topical, and laser antifungal treatment modalities.  We discussed their efficacies and side effects.  Patient opts for oral antifungal treatment modality #3 prescription for Lamisil 250 mg #90 daily.  She denies a history of liver pathology or symptoms.  Patient is otherwise healthy #4 return to clinic 6 months   Edrick Kins, DPM [facility]  Dr. Edrick Kins, DPM    [address], [place] [address]                Office [phone]  Fax [phone]

## 2020-12-10 NOTE — Anesthesia Pre-Procedure Evaluation (Signed)
Relevant Problems   RESPIRATORY SYSTEM   (+) Mild persistent asthma in adult without complication      NEUROLOGY   (+) Episodic tension-type headache, not intractable        Anesthetic History          Comments: Anesthesia consult note   Sarinity Dicicco PA-C      ERD:EYCXKG Norval Gable, MD      COVID vaccine:    + COVID 03/20/2020     Review of Systems / Medical History  Patient summary reviewed, nursing notes reviewed and pertinent labs reviewed    Pulmonary        Sleep apnea    Asthma ( PROAIR HFA 90 mcg/actuation INH HFAA inhaler prn, )   Pertinent negatives: No smoker  Comments: Seasonal allergies- FLONASE) 50 mcg/actuation prn, loratadine (CLARITIN )   Neuro/Psych         Headaches    Comments: Lumbar radiculopathy w Back pain - Meloxicam 15 mg prn Cardiovascular    Hypertension ( amLODIPine (NORVASC) 10 mg qd): well controlled                Comments: EKG 08/16/2020:  OTHERWISE NORMAL ECG -     - SR-Sinus rhythm-normal P axis, V-rate 50-99   - T0LA-Borderline T abnormalities, lateral leads-T flat/neg, I aVL V5 V6   - -No prior tracing for comparison-        GI/Hepatic/Renal                Endo/Other    Diabetes ( metFORMIN ER (GLUCOPHAGE ER) 500 mg qd)    Morbid obesity ( Phentermine 30 mg - BMI)    Comments: 11/14/2020: Hemoglobin A1c -  5.5   Estimated Average Glucose - 112      Other Findings   Comments: Chest x-ray 04/09/2020:  IMPRESSION   -- Mild hypoinflation of the bilateral lungs likely causing prominence of the bilateral interstitial markings, similar to prior exam.   Otherwise no acute cardiopulmonary process.                Anesthesia Plan

## 2020-12-11 ENCOUNTER — Ambulatory Visit: Payer: PRIVATE HEALTH INSURANCE | Primary: Internal Medicine

## 2020-12-11 NOTE — Interval H&P Note (Signed)
I have contacted the patient identified as [name] [name] DOB: [birth year] to confirm if they are attending the [facility] appointment scheduled for today at 1400.  Pt states she had contacted surgeon's scheduler on 12/09/2020 and informed she was unable to keep her 12/18/2020 surgery date and will have to re-schedue for a later time.  Pt will not be coming to 12/18/2020 surgery day or PSAT appt today.   Advised patient once surgery is re-scheduled, she will need to come in for PSAT appt before new date.  Dionisio Paschal, MHA, BSN, RN, AMB-BC  PSAT  12/11/20  2:22 PM

## 2020-12-21 ENCOUNTER — Inpatient Hospital Stay
Payer: PRIVATE HEALTH INSURANCE | Attending: Student in an Organized Health Care Education/Training Program | Primary: Internal Medicine

## 2021-01-09 ENCOUNTER — Encounter: Attending: Sports Medicine | Primary: Internal Medicine

## 2021-01-13 ENCOUNTER — Inpatient Hospital Stay: Admit: 2021-01-13 | Payer: PRIVATE HEALTH INSURANCE | Attending: Sports Medicine | Primary: Internal Medicine

## 2021-01-13 ENCOUNTER — Ambulatory Visit
Admit: 2021-01-13 | Discharge: 2021-01-13 | Payer: PRIVATE HEALTH INSURANCE | Attending: Sports Medicine | Primary: Internal Medicine

## 2021-01-13 ENCOUNTER — Ambulatory Visit: Attending: Sports Medicine | Primary: Internal Medicine

## 2021-01-13 DIAGNOSIS — M7711 Lateral epicondylitis, right elbow: Secondary | ICD-10-CM

## 2021-01-13 NOTE — Procedures (Signed)
PROCEDURE NOTE:  Time out: 946am  * Patient was identified by name and date of birth   * Agreement on procedure being performed was verified  * Risks and Benefits explained to the patient  * Procedure site verified and marked as necessary  * Patient was positioned for comfort  * Consent was signed and verified.  Risks/benefits including but not limited to bleeding, infection, and scarring discussed and Pt wishes to proceed with procedure.     The area was prepped with betadine.  Ethyl chloride spray was used. Under sterile technique with ultrasound guidance using Terason uSmart 3200T with 4-15 MHz linear transducer.    1cc of D50mg /cc with 1cc mepivacaine were injected into point of maximal tenderness of right lateral epicondyle extensor wad using distal approach.     Sterile gauze used to clean the area.  Blood loss minimal.  Noticed improvement in pain Sx within 5 minutes (now rated 0/10).     Tolerated procedure well.  Discussed possible signs/Sx of infxn, and advised to seek care if concerned.     Ultrasound images (if applicable) digitally attached to CPT order in patients chart.

## 2021-01-13 NOTE — Progress Notes (Signed)
HISTORY OF PRESENT ILLNESS    [name] [name] [birth year] is a 55 y.o. year old female comes in today to be evaluated and treated for: right elbow pain    Since last appt has noticed pain improved after injected 11/28/2020 but worsened after took brace off recently. Pain level 3/10. Using rest with benefit and mobic a little help.    IMAGING: XR right elbow pending    Past Surgical History:   Procedure Laterality Date    COLONOSCOPY N/A 01/25/2020    COLONOSCOPY performed by Ihor Austin, MD at [facility] ENDOSCOPY    HX APPENDECTOMY  2006    HX BACK SURGERY      HX CESAREAN SECTION  1987, 1994, 43    HX CHOLECYSTECTOMY  1998    HX HYSTERECTOMY      uterus removed    HX HYSTERECTOMY  2012     Social History     Socioeconomic History    Marital status: MARRIED   Tobacco Use    Smoking status: Never    Smokeless tobacco: Never   Vaping Use    Vaping Use: Never used   Substance and Sexual Activity    Alcohol use: Not Currently     Alcohol/week: 1.0 standard drink     Types: 1 Glasses of wine per week    Drug use: No    Sexual activity: Yes     Partners: Male     Birth control/protection: Surgical   Other Topics Concern    Weight Concern Yes     Current Outpatient Medications   Medication Sig Dispense Refill    metFORMIN (GLUCOPHAGE) 500 mg tablet Take 500 mg by mouth daily (with breakfast).      phentermine (ADIPEX-P) 37.5 mg tablet Take 37.5 mg by mouth every morning.      lidocaine (XYLOCAINE) 5 % ointment Apply  to affected area as needed for Pain. Apply pea sized amount to right elbow every 4 hours as needed. 30 g 2    meloxicam (MOBIC) 15 mg tablet Take 1 tab daily as needed pain with food. 90 Tablet 0    pregabalin (Lyrica) 150 mg capsule Take 1 Capsule by mouth two (2) times a day. Max Daily Amount: 300 mg. (Patient not taking: Reported on 11/28/2020) 60 Capsule 2    amLODIPine (NORVASC) 5 mg tablet Take 10 mg by mouth daily.      multivitamin (ONE A DAY) tablet Take 1 Tab by mouth daily.      albuterol (PROAIR HFA) 90  mcg/actuation inhaler Take 2 Puffs by inhalation every six (6) hours as needed for Wheezing. Indications: ACUTE ASTHMA ATTACK 1 Inhaler 5    cetirizine (ZYRTEC) 10 mg tablet Take 1 Tab by mouth daily. Indications: PERENNIAL ALLERGIC RHINITIS 30 Tab 5    fluticasone (FLONASE) 50 mcg/actuation nasal spray 2 Sprays by Both Nostrils route daily. Indications: ALLERGIC RHINITIS 1 Bottle 5     Past Medical History:   Diagnosis Date    Asthma     COVID-19 11/2018, 02/2020    H/O seasonal allergies     Headache     Hx of cholecystectomy 1999    Hypertension     Morbid obesity (HCC)     Sleep apnea 07/2020    no cpap     Family History   Problem Relation Age of Onset    Heart Disease Mother     Hypertension Mother     Diabetes Maternal Aunt  Cancer Maternal Grandmother         colon       ROS:  No numb, tingle    Objective:  Wt 279 lb (126.6 kg)    BMI 47.89 kg/m??   NEURO:  Sensation intact light touch upper and lower extremities.  Biceps & Triceps reflexes +2/4 bilaterally.  right hand dominant. Spurling Negative bilateral   M/S:  right elbow/wrist:  Negative boney tenderness. Phalen's negative.  Tinel's negative.  Strength +5/5 bilateral .  Piano key sign Negative bilateral .  Carpal bone motion normal.  Finklestein's negative  TFCC Load Test negative.  RIGHT medial epicondyle(s) with TTP not worsened with wrist extension.   negative muscular atrophy.    Assessment/Plan:     ICD-10-CM ICD-9-CM    1. Lateral epicondylitis, right elbow  M77.11 726.32 XR ELBOW RT MIN 3 V      AMB POC [place], SONO GUIDE NEEDLE      PR INJECT TENDON ORIGIN/INSERT      2. Elbow pain, right  M25.521 719.42           Patient (or guardian if minor) verbalizes understanding of evaluation and plan.    Will inject D25 and avoid NSAID, ice as above and plan follow-up 4 weeks.

## 2021-01-18 ENCOUNTER — Other Ambulatory Visit: Payer: Self-pay | Admitting: Family Medicine

## 2021-01-18 DIAGNOSIS — E559 Vitamin D deficiency, unspecified: Secondary | ICD-10-CM

## 2021-01-28 ENCOUNTER — Other Ambulatory Visit: Payer: Self-pay

## 2021-01-28 ENCOUNTER — Encounter: Payer: Self-pay | Admitting: Family Medicine

## 2021-01-28 ENCOUNTER — Ambulatory Visit: Payer: BC Managed Care – PPO | Admitting: Family Medicine

## 2021-01-28 VITALS — BP 106/64 | HR 84 | Temp 97.4°F | Ht 60.0 in | Wt 163.4 lb

## 2021-01-28 DIAGNOSIS — B354 Tinea corporis: Secondary | ICD-10-CM

## 2021-01-28 DIAGNOSIS — T887XXA Unspecified adverse effect of drug or medicament, initial encounter: Secondary | ICD-10-CM | POA: Diagnosis not present

## 2021-01-28 DIAGNOSIS — Z23 Encounter for immunization: Secondary | ICD-10-CM

## 2021-01-28 DIAGNOSIS — E785 Hyperlipidemia, unspecified: Secondary | ICD-10-CM

## 2021-01-28 DIAGNOSIS — Z0000 Encounter for general adult medical examination without abnormal findings: Secondary | ICD-10-CM | POA: Diagnosis not present

## 2021-01-28 MED ORDER — FLUCONAZOLE 150 MG PO TABS: 150.0000 mg | ORAL_TABLET | ORAL | 0 refills | Status: AC

## 2021-01-28 MED ORDER — SIMVASTATIN 40 MG PO TABS: 40.0000 mg | ORAL_TABLET | Freq: Every day | ORAL | 3 refills | Status: DC

## 2021-01-28 NOTE — Progress Notes (Signed)
Established Patient Office Visit  Subjective:  Patient ID: Natalie Martinez, female    DOB: [birth year]  Age: 55 y.o. MRN: [id number]  CC:  Chief Complaint  Patient presents with   Follow-up    Follow up on medications per patient Atorvastain seems to cause bad stomach pains would like something else taking simvastatin. Not fasting, need shingles #2    HPI Natalie Martinez presents for follow-up of her elevated cholesterol, rash on her arms and for vaccinations.  She would like her flu shot and second Shingrix shot today.  She did experience a fever and a sore arm with her first Shingrix shot.  She did not tolerate atorvastatin.  It had caused stomach upset.  She restarted her simvastatin.  She has a rash on her arms.  She says that it was biopsied by the dermatologist and they told her it is a fungal infection.  Past Medical History:  Diagnosis Date   Allergy    Arthritis    Hypercholesterolemia    Neuromuscular disorder ([place])    sciatic pain    NSVD (normal spontaneous vaginal delivery)    X3   Vertigo     Past Surgical History:  Procedure Laterality Date   CERVICAL BIOPSY  W/ LOOP ELECTRODE EXCISION  2004  &  2005   X 2   DILATION AND CURETTAGE OF UTERUS  2000 & 2003   VAGINAL HYSTERECTOMY  2005   RECURRENT CERVICAL DYSPLASIA    Family History  Problem Relation Age of Onset   Diabetes Mother    Healthy Sister    Healthy Brother    Healthy Brother    Breast cancer Neg Hx    Colon cancer Neg Hx    Colon polyps Neg Hx    Esophageal cancer Neg Hx    Rectal cancer Neg Hx    Stomach cancer Neg Hx     Social History   Socioeconomic History   Marital status: Single    Spouse name: Not on file   Number of children: 3   Years of education: 15   Highest education level: Not on file  Occupational History   Occupation: Glass blower/designer  Tobacco Use   Smoking status: Never   Smokeless tobacco: Never  Vaping Use   Vaping Use: Never used  Substance  and Sexual Activity   Alcohol use: No    Alcohol/week: 0.0 standard drinks   Drug use: No   Sexual activity: Not Currently    Birth control/protection: None, Surgical    Comment: 1st intercourse- 25, partners- 3,   Other Topics Concern   Not on file  Social History Narrative   Lives at home with her daughter and her mother.   Occasional caffeine use (coffee twice weekly).   Right-handed.   Social Determinants of Health   Financial Resource Strain: Not on file  Food Insecurity: Not on file  Transportation Needs: Not on file  Physical Activity: Not on file  Stress: Not on file  Social Connections: Not on file  Intimate Partner Violence: Not on file    Outpatient Medications Prior to Visit  Medication Sig Dispense Refill   UNABLE TO FIND Med Name: medicine for arthritis     Vitamin D, Ergocalciferol, (DRISDOL) 1.25 MG (50000 UNIT) CAPS capsule TAKE 1 CAPSULE BY MOUTH EVERY 7 DAYS 11 capsule 0   atorvastatin (LIPITOR) 40 MG tablet Take 1 tablet (40 mg total) by mouth daily. (Patient not taking: Reported on 01/28/2021) 90 tablet  1   diclofenac (VOLTAREN) 75 MG EC tablet Take 75 mg by mouth 2 (two) times daily. (Patient not taking: Reported on 01/28/2021)     terbinafine (LAMISIL) 250 MG tablet Take 1 tablet (250 mg total) by mouth daily. (Patient not taking: Reported on 01/28/2021) 90 tablet 0   No facility-administered medications prior to visit.    Allergies  Allergen Reactions   Atorvastatin Nausea Only    ROS Review of Systems  Constitutional: Negative.   HENT: Negative.    Eyes:  Negative for photophobia and visual disturbance.  Respiratory: Negative.    Cardiovascular: Negative.   Gastrointestinal: Negative.   Genitourinary: Negative.   Musculoskeletal:  Negative for gait problem and joint swelling.  Skin:  Negative for pallor and rash.  Neurological:  Negative for speech difficulty and weakness.  Hematological:  Does not bruise/bleed easily.   Psychiatric/Behavioral: Negative.       Objective:    Physical Exam Vitals and nursing note reviewed.  Constitutional:      General: She is not in acute distress.    Appearance: Normal appearance. She is not ill-appearing, toxic-appearing or diaphoretic.  HENT:     Head: Normocephalic and atraumatic.     Right Ear: Tympanic membrane and external ear normal.     Left Ear: Tympanic membrane, ear canal and external ear normal.     Mouth/Throat:     Mouth: Mucous membranes are moist.     Pharynx: Oropharynx is clear. No oropharyngeal exudate or posterior oropharyngeal erythema.  Eyes:     General: No scleral icterus.       Right eye: No discharge.        Left eye: No discharge.     Extraocular Movements: Extraocular movements intact.     Conjunctiva/sclera: Conjunctivae normal.     Pupils: Pupils are equal, round, and reactive to light.  Neck:     Vascular: No carotid bruit.  Cardiovascular:     Rate and Rhythm: Normal rate and regular rhythm.  Pulmonary:     Effort: Pulmonary effort is normal.     Breath sounds: Normal breath sounds.  Abdominal:     General: Bowel sounds are normal.  Musculoskeletal:     Cervical back: No rigidity or tenderness.  Lymphadenopathy:     Cervical: No cervical adenopathy.  Skin:    General: Skin is warm and dry.       Neurological:     Mental Status: She is alert and oriented to person, place, and time.  Psychiatric:        Mood and Affect: Mood normal.        Behavior: Behavior normal.    BP 106/64 (BP Location: Right Arm, Patient Position: Sitting, Cuff Size: Normal)   Pulse 84   Temp (!) 97.4 F (36.3 C) (Temporal)   Ht 5' (1.524 m)   Wt 163 lb 6.4 oz (74.1 kg)   SpO2 96%   BMI 31.91 kg/m  Wt Readings from Last 3 Encounters:  01/28/21 163 lb 6.4 oz (74.1 kg)  11/05/20 157 lb 3.2 oz (71.3 kg)  10/01/20 158 lb (71.7 kg)     Health Maintenance Due  Topic Date Due   Zoster Vaccines- Shingrix (2 of 2) 12/31/2020    There  are no preventive care reminders to display for this patient.  Lab Results  Component Value Date   TSH 1.70 06/21/2017   Lab Results  Component Value Date   WBC 5.1 11/05/2020   HGB 13.7  11/05/2020   HCT 40.6 11/05/2020   MCV 94.4 11/05/2020   PLT 231.0 11/05/2020   Lab Results  Component Value Date   NA 141 11/05/2020   K 4.3 11/05/2020   CO2 31 11/05/2020   GLUCOSE 85 11/05/2020   BUN 14 11/05/2020   CREATININE 0.67 11/05/2020   BILITOT 0.9 11/05/2020   ALKPHOS 75 11/05/2020   AST 20 11/05/2020   ALT 17 11/05/2020   PROT 7.4 11/05/2020   ALBUMIN 4.4 11/05/2020   CALCIUM 10.1 11/05/2020   GFR 100.22 11/05/2020   Lab Results  Component Value Date   CHOL 241 (H) 11/05/2020   Lab Results  Component Value Date   HDL 45.60 11/05/2020   Lab Results  Component Value Date   LDLCALC 105 (H) 01/10/2020   Lab Results  Component Value Date   TRIG 267.0 (H) 11/05/2020   Lab Results  Component Value Date   CHOLHDL 5 11/05/2020   No results found for: HGBA1C    Assessment & Plan:   Problem List Items Addressed This Visit       Musculoskeletal and Integument   Tinea corporis   Relevant Medications   fluconazole (DIFLUCAN) 150 MG tablet     Other   Dyslipidemia with elevated low density lipoprotein (LDL) cholesterol and abnormally low high density lipoprotein cholesterol   Relevant Medications   simvastatin (ZOCOR) 40 MG tablet   Other Relevant Orders   Comprehensive metabolic panel   Lipid panel   Healthcare maintenance - Primary   Relevant Orders   CBC   Urinalysis, Routine w reflex microscopic   Flu Vaccine QUAD 6+ mos PF IM (Fluarix Quad PF) (Completed)   Varicella-zoster vaccine IM (Shingrix)   Other Visit Diagnoses     Medication side effect           Meds ordered this encounter  Medications   fluconazole (DIFLUCAN) 150 MG tablet    Sig: Take 1 tablet (150 mg total) by mouth once a week for 28 days.    Dispense:  4 tablet    Refill:  0    simvastatin (ZOCOR) 40 MG tablet    Sig: Take 1 tablet (40 mg total) by mouth at bedtime.    Dispense:  90 tablet    Refill:  3    Follow-up: Return in about 6 months (around 07/28/2021), or Return fasting for blood work and 2nd Shingrix ordered today..  Diflucan weekly for 4 weeks.  She will let me know if this does not help.  Discussed the importance of fasting before blood draws for accurate lipid profiles.   Libby Maw, MD

## 2021-01-29 ENCOUNTER — Ambulatory Visit: Payer: BC Managed Care – PPO

## 2021-02-05 ENCOUNTER — Other Ambulatory Visit: Payer: Self-pay

## 2021-02-05 ENCOUNTER — Other Ambulatory Visit (INDEPENDENT_AMBULATORY_CARE_PROVIDER_SITE_OTHER): Payer: BC Managed Care – PPO

## 2021-02-05 ENCOUNTER — Ambulatory Visit (INDEPENDENT_AMBULATORY_CARE_PROVIDER_SITE_OTHER): Payer: BC Managed Care – PPO

## 2021-02-05 DIAGNOSIS — Z0000 Encounter for general adult medical examination without abnormal findings: Secondary | ICD-10-CM

## 2021-02-05 DIAGNOSIS — E785 Hyperlipidemia, unspecified: Secondary | ICD-10-CM | POA: Diagnosis not present

## 2021-02-05 DIAGNOSIS — Z23 Encounter for immunization: Secondary | ICD-10-CM

## 2021-02-05 LAB — URINALYSIS, ROUTINE W REFLEX MICROSCOPIC
Bilirubin Urine: NEGATIVE
Ketones, ur: NEGATIVE
Leukocytes,Ua: NEGATIVE
Nitrite: NEGATIVE
Specific Gravity, Urine: 1.015 (ref 1.000–1.030)
Total Protein, Urine: NEGATIVE
Urine Glucose: NEGATIVE
Urobilinogen, UA: 0.2 (ref 0.0–1.0)
pH: 6 (ref 5.0–8.0)

## 2021-02-05 LAB — COMPREHENSIVE METABOLIC PANEL
ALT: 18 U/L (ref 0–35)
AST: 19 U/L (ref 0–37)
Albumin: 4.5 g/dL (ref 3.5–5.2)
Alkaline Phosphatase: 90 U/L (ref 39–117)
BUN: 14 mg/dL (ref 6–23)
CO2: 31 mEq/L (ref 19–32)
Calcium: 10.1 mg/dL (ref 8.4–10.5)
Chloride: 104 mEq/L (ref 96–112)
Creatinine, Ser: 0.64 mg/dL (ref 0.40–1.20)
GFR: 101.16 mL/min (ref >60.00)
Glucose, Bld: 88 mg/dL (ref 70–99)
Potassium: 4.9 mEq/L (ref 3.5–5.1)
Sodium: 142 mEq/L (ref 135–145)
Total Bilirubin: 0.5 mg/dL (ref 0.2–1.2)
Total Protein: 7.2 g/dL (ref 6.0–8.3)

## 2021-02-05 LAB — LIPID PANEL
Cholesterol: 225 mg/dL — ABNORMAL HIGH (ref 0–200)
HDL: 49.5 mg/dL (ref >39.00)
LDL Cholesterol: 140 mg/dL — ABNORMAL HIGH (ref 0–99)
NonHDL: 175.07
Total CHOL/HDL Ratio: 5
Triglycerides: 177 mg/dL — ABNORMAL HIGH (ref 0.0–149.0)
VLDL: 35.4 mg/dL (ref 0.0–40.0)

## 2021-02-05 LAB — CBC
HCT: 40.1 % (ref 36.0–46.0)
Hemoglobin: 13.3 g/dL (ref 12.0–15.0)
MCHC: 33.2 g/dL (ref 30.0–36.0)
MCV: 94.4 fl (ref 78.0–100.0)
Platelets: 251 10*3/uL (ref 150.0–400.0)
RBC: 4.25 Mil/uL (ref 3.87–5.11)
RDW: 13.2 % (ref 11.5–15.5)
WBC: 4.2 10*3/uL (ref 4.0–10.5)

## 2021-02-05 NOTE — Progress Notes (Signed)
Per the orders of Dr. Ethelene Hal pt is here for fasting labs, pt tolerated draw well. Pt was able to leave adequate amount of urine for urine sample requested.

## 2021-02-05 NOTE — Progress Notes (Signed)
Per the orders of Dr. Ethelene Hal pt is here for second dose shingles vaccine pt received vaccine in right deltoid at 08:15 am, given by [place] CMA/CPT. Pt tolerated vaccine well.

## 2021-02-10 ENCOUNTER — Ambulatory Visit
Admit: 2021-02-10 | Discharge: 2021-02-10 | Payer: PRIVATE HEALTH INSURANCE | Attending: Sports Medicine | Primary: Internal Medicine

## 2021-02-10 DIAGNOSIS — M7711 Lateral epicondylitis, right elbow: Secondary | ICD-10-CM

## 2021-02-10 NOTE — Progress Notes (Signed)
HISTORY OF PRESENT ILLNESS    [name] [name] September 28, 1965 is a 55 y.o. year old female comes in today to be evaluated and treated for: right elbiw pain    Since last appt has noticed pain significant and D25 #1 01/13/2021. Pain level 7/10. Using rest and no meds.    IMAGING: XR right elbow 01/13/2021  IMPRESSION    No acute radiographic findings.    Past Surgical History:   Procedure Laterality Date    COLONOSCOPY N/A 01/25/2020    COLONOSCOPY performed by Ihor Austin, MD at [facility] ENDOSCOPY    HX APPENDECTOMY  2006    HX BACK SURGERY      HX CESAREAN SECTION  1987, 1994, 81    HX CHOLECYSTECTOMY  1998    HX HYSTERECTOMY      uterus removed    HX HYSTERECTOMY  2012     Social History     Socioeconomic History    Marital status: MARRIED   Tobacco Use    Smoking status: Never    Smokeless tobacco: Never   Vaping Use    Vaping Use: Never used   Substance and Sexual Activity    Alcohol use: Not Currently     Alcohol/week: 1.0 standard drink     Types: 1 Glasses of wine per week    Drug use: No    Sexual activity: Yes     Partners: Male     Birth control/protection: Surgical   Other Topics Concern    Weight Concern Yes     Current Outpatient Medications   Medication Sig Dispense Refill    metFORMIN (GLUCOPHAGE) 500 mg tablet Take 500 mg by mouth daily (with breakfast).      phentermine (ADIPEX-P) 37.5 mg tablet Take 37.5 mg by mouth every morning.      lidocaine (XYLOCAINE) 5 % ointment Apply  to affected area as needed for Pain. Apply pea sized amount to right elbow every 4 hours as needed. 30 g 2    meloxicam (MOBIC) 15 mg tablet Take 1 tab daily as needed pain with food. 90 Tablet 0    pregabalin (Lyrica) 150 mg capsule Take 1 Capsule by mouth two (2) times a day. Max Daily Amount: 300 mg. 60 Capsule 2    amLODIPine (NORVASC) 5 mg tablet Take 10 mg by mouth daily.      multivitamin (ONE A DAY) tablet Take 1 Tab by mouth daily.      albuterol (PROAIR HFA) 90 mcg/actuation inhaler Take 2 Puffs by inhalation every six  (6) hours as needed for Wheezing. Indications: ACUTE ASTHMA ATTACK 1 Inhaler 5    cetirizine (ZYRTEC) 10 mg tablet Take 1 Tab by mouth daily. Indications: PERENNIAL ALLERGIC RHINITIS 30 Tab 5    fluticasone (FLONASE) 50 mcg/actuation nasal spray 2 Sprays by Both Nostrils route daily. Indications: ALLERGIC RHINITIS 1 Bottle 5     Past Medical History:   Diagnosis Date    Asthma     COVID-19 11/2018, 02/2020    H/O seasonal allergies     Headache     Hx of cholecystectomy 1999    Hypertension     Morbid obesity (HCC)     Sleep apnea 07/2020    no cpap     Family History   Problem Relation Age of Onset    Heart Disease Mother     Hypertension Mother     Diabetes Maternal Aunt     Cancer Maternal Grandmother  colon         ROS:  No numb, tingle    Objective:  Resp 14    Ht 5\' 4"  (1.626 m)    Wt 276 lb (125.2 kg)    BMI 47.38 kg/m??   NEURO:  Sensation intact light touch upper and lower extremities.  Biceps & Triceps reflexes +2/4 bilaterally.  right hand dominant. Spurling Negative bilateral   M/S:  right elbow/wrist:  Negative boney tenderness. Phalen's negative.  Tinel's negative.  Strength +5/5 bilateral .  Piano key sign Negative bilateral .  Carpal bone motion normal.  Finklestein's negative  TFCC Load Test negative.  RIGHT medial epicondyle(s) with TTP not worsened with wrist extension.   negative muscular atrophy.    Assessment/Plan:     ICD-10-CM ICD-9-CM    1. Lateral epicondylitis, right elbow  M77.11 726.32 AMB POC , SONO GUIDE NEEDLE      PR INJECT TENDON ORIGIN/INSERT      2. Elbow pain, right  M25.521 719.42             Patient (or guardian if minor) verbalizes understanding of evaluation and plan.    Will inject D25 #2 and follow up 4 weeks for next. No ice, NSAID, but heat okay and brace PRN.

## 2021-02-10 NOTE — Procedures (Signed)
PROCEDURE NOTE:  Time out: 849am  * Patient was identified by name and date of birth   * Agreement on procedure being performed was verified  * Risks and Benefits explained to the patient  * Procedure site verified and marked as necessary  * Patient was positioned for comfort  * Consent was signed and verified.  Risks/benefits including but not limited to bleeding, infection, and scarring discussed and Pt wishes to proceed with procedure.     The area was prepped with betadine.  Ethyl chloride spray was used. Under sterile technique without ultrasound guidance   1cc of D50 and 1cc mepivacaine  were injected into point of maximal tenderness of right lateral epicondyle using distal approach.    Sterile gauze used to clean the areas.  Blood loss minimal.  Noticed improvement in pain Sx within 5 minutes (now rated 1/10).     Tolerated procedure well.  Discussed possible signs/Sx of infxn, and advised to seek care if concerned.     Ultrasound images (if applicable) digitally attached to CPT order in patients chart.

## 2021-02-19 ENCOUNTER — Other Ambulatory Visit: Payer: Self-pay

## 2021-02-19 DIAGNOSIS — E785 Hyperlipidemia, unspecified: Secondary | ICD-10-CM

## 2021-02-19 MED ORDER — SIMVASTATIN 40 MG PO TABS: 40.0000 mg | ORAL_TABLET | Freq: Every day | ORAL | 3 refills | Status: DC

## 2021-02-26 DIAGNOSIS — M458 Ankylosing spondylitis sacral and sacrococcygeal region: Secondary | ICD-10-CM | POA: Diagnosis not present

## 2021-02-26 DIAGNOSIS — M461 Sacroiliitis, not elsewhere classified: Secondary | ICD-10-CM | POA: Diagnosis not present

## 2021-03-10 ENCOUNTER — Encounter

## 2021-03-10 ENCOUNTER — Ambulatory Visit
Admit: 2021-03-10 | Discharge: 2021-03-10 | Payer: PRIVATE HEALTH INSURANCE | Attending: Sports Medicine | Primary: Internal Medicine

## 2021-03-10 DIAGNOSIS — M7711 Lateral epicondylitis, right elbow: Secondary | ICD-10-CM

## 2021-03-10 NOTE — Progress Notes (Signed)
HISTORY OF PRESENT ILLNESS    [name] [name] 1965/04/25 is a 55 y.o. year old female comes in today to be evaluated and treated for: right lateral epicondylitis    Since last appt has noticed pain similar D25 x2 but new tingling right forearm pinky. Pain level 4/10. Using wrist brace, tylenol, heat with benefit.    IMAGING: XR right elbow 01/13/2021  IMPRESSION  No acute radiographic findings.    Past Surgical History:   Procedure Laterality Date    COLONOSCOPY N/A 01/25/2020    COLONOSCOPY performed by Ihor Austin, MD at [facility] ENDOSCOPY    HX APPENDECTOMY  2006    HX BACK SURGERY      HX CESAREAN SECTION  1987, 1994, 73    HX CHOLECYSTECTOMY  1998    HX HYSTERECTOMY      uterus removed    HX HYSTERECTOMY  2012     Social History     Socioeconomic History    Marital status: MARRIED   Tobacco Use    Smoking status: Never    Smokeless tobacco: Never   Vaping Use    Vaping Use: Never used   Substance and Sexual Activity    Alcohol use: Not Currently     Alcohol/week: 1.0 standard drink     Types: 1 Glasses of wine per week    Drug use: No    Sexual activity: Yes     Partners: Male     Birth control/protection: Surgical   Other Topics Concern    Weight Concern Yes     Current Outpatient Medications   Medication Sig Dispense Refill    topiramate (TOPAMAX) 25 mg tablet       metFORMIN (GLUCOPHAGE) 500 mg tablet Take 500 mg by mouth daily (with breakfast).      phentermine (ADIPEX-P) 37.5 mg tablet Take 37.5 mg by mouth every morning.      lidocaine (XYLOCAINE) 5 % ointment Apply  to affected area as needed for Pain. Apply pea sized amount to right elbow every 4 hours as needed. 30 g 2    meloxicam (MOBIC) 15 mg tablet Take 1 tab daily as needed pain with food. 90 Tablet 0    pregabalin (Lyrica) 150 mg capsule Take 1 Capsule by mouth two (2) times a day. Max Daily Amount: 300 mg. 60 Capsule 2    amLODIPine (NORVASC) 5 mg tablet Take 10 mg by mouth daily.      multivitamin (ONE A DAY) tablet Take 1 Tab by mouth daily.       albuterol (PROAIR HFA) 90 mcg/actuation inhaler Take 2 Puffs by inhalation every six (6) hours as needed for Wheezing. Indications: ACUTE ASTHMA ATTACK 1 Inhaler 5    cetirizine (ZYRTEC) 10 mg tablet Take 1 Tab by mouth daily. Indications: PERENNIAL ALLERGIC RHINITIS 30 Tab 5    fluticasone (FLONASE) 50 mcg/actuation nasal spray 2 Sprays by Both Nostrils route daily. Indications: ALLERGIC RHINITIS 1 Bottle 5     Past Medical History:   Diagnosis Date    Asthma     COVID-19 11/2018, 02/2020    H/O seasonal allergies     Headache     Hx of cholecystectomy 1999    Hypertension     Morbid obesity (HCC)     Sleep apnea 07/2020    no cpap     Family History   Problem Relation Age of Onset    Heart Disease Mother     Hypertension Mother  Diabetes Maternal Aunt     Cancer Maternal Grandmother         colon         ROS:  See HPI    Objective:  Resp 14    Ht 5\' 4"  (1.626 m)    Wt 280 lb (127 kg)    BMI 48.06 kg/m??   NEURO:  Sensation intact light touch upper and lower extremities.  Biceps & Triceps reflexes +2/4 bilaterally.  right hand dominant. Spurling Negative bilateral   M/S:  right elbow/wrist:  Negative boney tenderness. Phalen's negative.  Tinel's negative.  Strength +5/5 bilateral .  Piano key sign Negative bilateral .  Carpal bone motion normal.  Finklestein's negative  TFCC Load Test negative.  RIGHT medial epicondyle(s) with TTP not worsened with wrist extension.   negative muscular atrophy.    Assessment/Plan:     ICD-10-CM ICD-9-CM    1. Lateral epicondylitis, right elbow  M77.11 726.32 MRI ELBOW RT WO CONT          Patient (or guardian if minor) verbalizes understanding of evaluation and plan.    Will await RMI elbow for lateral epicondylitis not improving prolo x2 as above and plan follow-up for results.    Total time spent on encounter including chart/imaging/lab review and evaluation/documentation/coordination of care/form completion but not including time for any procedures/manipulation 32  minutes.

## 2021-03-10 NOTE — Progress Notes (Signed)
Numbness started about 2 weeks ago.   Periodically

## 2021-03-22 DIAGNOSIS — N390 Urinary tract infection, site not specified: Secondary | ICD-10-CM | POA: Diagnosis not present

## 2021-03-31 ENCOUNTER — Ambulatory Visit: Payer: BC Managed Care – PPO | Admitting: Family Medicine

## 2021-03-31 ENCOUNTER — Other Ambulatory Visit: Payer: Self-pay

## 2021-03-31 VITALS — BP 114/78 | HR 73 | Temp 97.4°F | Ht 60.0 in | Wt 165.2 lb

## 2021-03-31 DIAGNOSIS — M459 Ankylosing spondylitis of unspecified sites in spine: Secondary | ICD-10-CM | POA: Insufficient documentation

## 2021-03-31 DIAGNOSIS — R300 Dysuria: Secondary | ICD-10-CM

## 2021-03-31 LAB — POCT URINALYSIS DIPSTICK
Bilirubin, UA: NEGATIVE
Blood, UA: 10
Glucose, UA: NEGATIVE
Ketones, UA: NEGATIVE
Nitrite, UA: NEGATIVE
Protein, UA: NEGATIVE
Spec Grav, UA: 1.015 (ref 1.010–1.025)
Urobilinogen, UA: 0.2 E.U./dL
pH, UA: 6 (ref 5.0–8.0)

## 2021-03-31 MED ORDER — SULFAMETHOXAZOLE-TRIMETHOPRIM 800-160 MG PO TABS
1.0000 | ORAL_TABLET | Freq: Two times a day (BID) | ORAL | 0 refills | Status: DC
Start: 2021-03-31 — End: 2021-06-06

## 2021-03-31 NOTE — Patient Instructions (Signed)
Urinary Tract Infection, Adult A urinary tract infection (UTI) is an infection of any part of the urinary tract. The urinary tract includes the kidneys, ureters, bladder, and urethra. These organs make, store, and get rid of urine in the body. An upper UTI affects the ureters and kidneys. A lower UTI affects the bladder and urethra. What are the causes? Most urinary tract infections are caused by bacteria in your genital area around your urethra, where urine leaves your body. These bacteria grow and cause inflammation of your urinary tract. What increases the risk? You are more likely to develop this condition if: You have a urinary catheter that stays in place. You are not able to control when you urinate or have a bowel movement (incontinence). You are female and you: Use a spermicide or diaphragm for birth control. Have low estrogen levels. Are pregnant. You have certain genes that increase your risk. You are sexually active. You take antibiotic medicines. You have a condition that causes your flow of urine to slow down, such as: An enlarged prostate, if you are female. Blockage in your urethra. A kidney stone. A nerve condition that affects your bladder control (neurogenic bladder). Not getting enough to drink, or not urinating often. You have certain medical conditions, such as: Diabetes. A weak disease-fighting system (immunesystem). Sickle cell disease. Gout. Spinal cord injury. What are the signs or symptoms? Symptoms of this condition include: Needing to urinate right away (urgency). Frequent urination. This may include small amounts of urine each time you urinate. Pain or burning with urination. Blood in the urine. Urine that smells bad or unusual. Trouble urinating. Cloudy urine. Vaginal discharge, if you are female. Pain in the abdomen or the lower back. You may also have: Vomiting or a decreased appetite. Confusion. Irritability or tiredness. A fever or  chills. Diarrhea. The first symptom in older adults may be confusion. In some cases, they may not have any symptoms until the infection has worsened. How is this diagnosed? This condition is diagnosed based on your medical history and a physical exam. You may also have other tests, including: Urine tests. Blood tests. Tests for STIs (sexually transmitted infections). If you have had more than one UTI, a cystoscopy or imaging studies may be done to determine the cause of the infections. How is this treated? Treatment for this condition includes: Antibiotic medicine. Over-the-counter medicines to treat discomfort. Drinking enough water to stay hydrated. If you have frequent infections or have other conditions such as a kidney stone, you may need to see a health care provider who specializes in the urinary tract (urologist). In rare cases, urinary tract infections can cause sepsis. Sepsis is a life-threatening condition that occurs when the body responds to an infection. Sepsis is treated in the hospital with IV antibiotics, fluids, and other medicines. Follow these instructions at home: Medicines Take over-the-counter and prescription medicines only as told by your health care provider. If you were prescribed an antibiotic medicine, take it as told by your health care provider. Do not stop using the antibiotic even if you start to feel better. General instructions Make sure you: Empty your bladder often and completely. Do not hold urine for long periods of time. Empty your bladder after sex. Wipe from front to back after urinating or having a bowel movement if you are female. Use each tissue only one time when you wipe. Drink enough fluid to keep your urine pale yellow. Keep all follow-up visits. This is important. Contact a health care provider   if: Your symptoms do not get better after 1-2 days. Your symptoms go away and then return. Get help right away if: You have severe pain in your  back or your lower abdomen. You have a fever or chills. You have nausea or vomiting. Summary A urinary tract infection (UTI) is an infection of any part of the urinary tract, which includes the kidneys, ureters, bladder, and urethra. Most urinary tract infections are caused by bacteria in your genital area. Treatment for this condition often includes antibiotic medicines. If you were prescribed an antibiotic medicine, take it as told by your health care provider. Do not stop using the antibiotic even if you start to feel better. Keep all follow-up visits. This is important. This information is not intended to replace advice given to you by your health care provider. Make sure you discuss any questions you have with your health care provider. Document Revised: 10/20/2019 Document Reviewed: 10/20/2019 Elsevier Patient Education  2022 [employer]  

## 2021-03-31 NOTE — Progress Notes (Signed)
[facility] [address] [place] [address] Dept: [phone] Dept Fax: [phone]  Office Visit  Subjective:    Patient ID: [name], female    DOB: 02/29/1966, 56 y.o..   MRN: [id number]  Chief Complaint  Patient presents with   Acute Visit    C/o having pain while urinating and lower back x 2 days.  Recently took antibotics form 03/22/21 for UTI.     History of Present Illness:  Patient is in today complaining of a 2-3 day history of suprapubic and low back discomfort associated with dark and foul smelling urine. She denies any urinary frequency or urgency, per se. Ms. Fults was recently diagnosed with a UTI at an Urgent Care visit. She was treated with a 7 day course of nitrofurantoin. She took the last pill on Friday (03/28/2020). She has had documented UTIs in the past.  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[place]) 11/02/2017   Class 1 obesity due to excess calories without serious comorbidity with body mass index (BMI) of 30.0 to 30.9 in adult 11/02/2017   Healthcare maintenance 06/21/2017   History of condyloma acuminatum 06/21/2017   Bone cyst of left tibia 06/21/2017   Dyslipidemia with elevated low density lipoprotein (LDL) cholesterol and abnormally low high density lipoprotein cholesterol 12/18/2016   Abnormal vision 03/26/2016   Osteoarthritis of knee 03/26/2016   Uricosuria 03/26/2016   Varicose veins of lower extremities with other complications 81/77/1165   ASCUS (atypical squamous cells of undetermined significance)  on Pap smear 04/06/2012   Hypercholesterolemia    Past Surgical History:  Procedure Laterality Date   CERVICAL BIOPSY  W/ LOOP ELECTRODE EXCISION  2004  &  2005   X 2   DILATION AND CURETTAGE OF UTERUS  2000 & 2003   VAGINAL HYSTERECTOMY  2005   RECURRENT CERVICAL DYSPLASIA   Family History  Problem Relation Age of Onset   Diabetes Mother    Healthy Sister    Healthy Brother    Healthy Brother    Breast cancer Neg Hx    Colon cancer Neg Hx    Colon polyps Neg Hx    Esophageal cancer Neg Hx    Rectal cancer Neg Hx    Stomach cancer Neg Hx    Outpatient Medications Prior to Visit  Medication Sig Dispense Refill   diclofenac (VOLTAREN) 50 MG EC tablet Take 50 mg by mouth 2 (two) times daily.     simvastatin (ZOCOR) 40 MG tablet Take 1 tablet (40 mg total) by mouth at bedtime. 90 tablet 3   UNABLE TO FIND Med Name: medicine for arthritis     Vitamin D, Ergocalciferol, (DRISDOL) 1.25 MG (50000 UNIT) CAPS capsule TAKE 1 CAPSULE BY MOUTH EVERY 7 DAYS 11 capsule 0   No facility-administered medications prior to visit.   Allergies  Allergen Reactions   Atorvastatin Nausea Only      Objective:   Today's Vitals   03/31/21 1545  BP: 114/78  Pulse: 73  Temp: (!) 97.4 F (36.3 C)  TempSrc: Temporal  SpO2: 96%  Weight: 165 lb 3.2 oz (  74.9 kg)  Height: 5' (1.524 m)   Body mass index is 32.26 kg/m.   General: Well developed, well nourished. No acute distress. Psych: Alert and oriented x3. Normal mood and affect.  There are no preventive care reminders to display for this patient.  Lab Results Urine dipstick shows negative for nitrites, glucose, protein, ketones, urobilinogen, positive for leukocytes, red blood cells.  Assessment & Plan:   1. Dysuria Ms. Heupel's symptoms may represent a recurrent or incompletely treated UTI. I do not have any urine culture results available from her recent UC visit. I will treat her empirically with Septra. I will send her urine for  culture. I recommend she push fluids and consider taking a cranberry extract (she dislikes cranberry juice). Follow-up if symptoms persist.  - POCT Urinalysis Dipstick - sulfamethoxazole-trimethoprim (BACTRIM DS) 800-160 MG tablet; Take 1 tablet by mouth 2 (two) times daily.  Dispense: 10 tablet; Refill: 0 - Urine Culture  Haydee Salter, MD

## 2021-04-01 LAB — URINE CULTURE
MICRO NUMBER:: 12845052
Result:: NO GROWTH
SPECIMEN QUALITY:: ADEQUATE

## 2021-04-07 ENCOUNTER — Inpatient Hospital Stay: Admit: 2021-04-07 | Payer: PRIVATE HEALTH INSURANCE | Attending: Sports Medicine | Primary: Internal Medicine

## 2021-04-07 DIAGNOSIS — M7711 Lateral epicondylitis, right elbow: Secondary | ICD-10-CM

## 2021-04-28 ENCOUNTER — Ambulatory Visit
Admit: 2021-04-28 | Discharge: 2021-04-28 | Payer: PRIVATE HEALTH INSURANCE | Attending: Sports Medicine | Primary: Internal Medicine

## 2021-04-28 ENCOUNTER — Ambulatory Visit: Attending: Sports Medicine | Primary: Internal Medicine

## 2021-04-28 DIAGNOSIS — M7711 Lateral epicondylitis, right elbow: Secondary | ICD-10-CM

## 2021-04-28 NOTE — Progress Notes (Signed)
AVS reviewed: YES  HEP: N/A  Resources Provided: NO   Patient questions/concerns answered: YES  Patient verbalized understanding of treatment plan: YES

## 2021-04-28 NOTE — Progress Notes (Signed)
HISTORY OF PRESENT ILLNESS    [name] [name] 1965-10-28 is a 56 y.o. year old female comes in today to be evaluated and treated for: right elbow pain    Since last appt has noticed pain no better D25 x2. Pain level 5/10. Using brace, tylenol, heat without benefit.    IMAGING: MRI right elbow 04/07/2021  IMPRESSION  1. Confirmed severe common extensor tendinosis/lateral epicondylitis with  partial-thickness tear. Mild radial collateral ligament sprain.  2. Triceps tendinosis with olecranon bursitis    XR right elbow 01/13/2021  IMPRESSION  No acute radiographic findings.    Past Surgical History:   Procedure Laterality Date    COLONOSCOPY N/A 01/25/2020    COLONOSCOPY performed by Ihor Austin, MD at [facility] ENDOSCOPY    HX APPENDECTOMY  2006    HX BACK SURGERY      HX CESAREAN SECTION  1987, 1994, 34    HX CHOLECYSTECTOMY  1998    HX HYSTERECTOMY      uterus removed    HX HYSTERECTOMY  2012     Social History     Socioeconomic History    Marital status: MARRIED   Tobacco Use    Smoking status: Never    Smokeless tobacco: Never   Vaping Use    Vaping Use: Never used   Substance and Sexual Activity    Alcohol use: Not Currently     Alcohol/week: 1.0 standard drink     Types: 1 Glasses of wine per week    Drug use: No    Sexual activity: Yes     Partners: Male     Birth control/protection: Surgical   Other Topics Concern    Weight Concern Yes     Current Outpatient Medications   Medication Sig Dispense Refill    topiramate (TOPAMAX) 25 mg tablet       metFORMIN (GLUCOPHAGE) 500 mg tablet Take 500 mg by mouth daily (with breakfast).      phentermine (ADIPEX-P) 37.5 mg tablet Take 37.5 mg by mouth every morning.      lidocaine (XYLOCAINE) 5 % ointment Apply  to affected area as needed for Pain. Apply pea sized amount to right elbow every 4 hours as needed. 30 g 2    meloxicam (MOBIC) 15 mg tablet Take 1 tab daily as needed pain with food. 90 Tablet 0    pregabalin (Lyrica) 150 mg capsule Take 1 Capsule by mouth two (2)  times a day. Max Daily Amount: 300 mg. 60 Capsule 2    amLODIPine (NORVASC) 5 mg tablet Take 10 mg by mouth daily.      multivitamin (ONE A DAY) tablet Take 1 Tab by mouth daily.      albuterol (PROAIR HFA) 90 mcg/actuation inhaler Take 2 Puffs by inhalation every six (6) hours as needed for Wheezing. Indications: ACUTE ASTHMA ATTACK 1 Inhaler 5    cetirizine (ZYRTEC) 10 mg tablet Take 1 Tab by mouth daily. Indications: PERENNIAL ALLERGIC RHINITIS 30 Tab 5    fluticasone (FLONASE) 50 mcg/actuation nasal spray 2 Sprays by Both Nostrils route daily. Indications: ALLERGIC RHINITIS 1 Bottle 5     Past Medical History:   Diagnosis Date    Asthma     COVID-19 11/2018, 02/2020    H/O seasonal allergies     Headache     Hx of cholecystectomy 1999    Hypertension     Morbid obesity (HCC)     Sleep apnea 07/2020    no cpap  Family History   Problem Relation Age of Onset    Heart Disease Mother     Hypertension Mother     Diabetes Maternal Aunt     Cancer Maternal Grandmother         colon       ROS:  + numb    Objective:  Wt 279 lb (126.6 kg)    BMI 47.89 kg/m??   NEURO:  Sensation intact light touch upper and lower extremities.  Biceps & Triceps reflexes +2/4 bilaterally.  right hand dominant. Spurling Negative bilateral   M/S:  right elbow/wrist:  Negative boney tenderness. Phalen's negative.  Tinel's negative.  Strength +5/5 bilateral .  Piano key sign Negative bilateral .  Carpal bone motion normal.  Finklestein's negative  TFCC Load Test negative.  RIGHT lateral epicondyle(s) with TTP +++ worsened with wrist extension.   negative muscular atrophy.    Assessment/Plan:     ICD-10-CM ICD-9-CM    1. Lateral epicondylitis, right elbow  M77.11 726.32 REFERRAL TO ORTHOPEDIC SURGERY          Patient (or guardian if minor) verbalizes understanding of evaluation and plan.    Will refer surgeon as failed cortisone then prolox2 and tear on MRI.

## 2021-05-06 ENCOUNTER — Encounter

## 2021-05-23 ENCOUNTER — Ambulatory Visit
Admit: 2021-05-23 | Discharge: 2021-05-23 | Payer: PRIVATE HEALTH INSURANCE | Attending: Orthopaedic Surgery | Primary: Internal Medicine

## 2021-05-23 DIAGNOSIS — M7711 Lateral epicondylitis, right elbow: Secondary | ICD-10-CM

## 2021-05-23 NOTE — Progress Notes (Signed)
[facility] OFFICE NOTE      Patient: [name] [name]                MRN: [id number]       SSN: [id number]  Date of Birth: [birth year]        AGE: 56 y.o.        SEX: female  Body mass index is 47.89 kg/m??.    PCP: [name] JunglingJANINE M FRANK, MD  05/23/21    CHIEF COMPLAINT: Right elbow pain 10/10    HPI: [name] [name] is a 56 y.o. female patient who presents to the office today with chronic right elbow pain over the lateral epicondyle.  The pain radiates into her forearm.  She has had extensive treatment by Dr. Lexine BatonMcCue including injections and therapy exercises without resolution of her symptoms.  She is also imaging including an MRI.    Past Medical History:   Diagnosis Date    Asthma     COVID-19 11/2018, 02/2020    H/O seasonal allergies     Headache     Hx of cholecystectomy 1999    Hypertension     Morbid obesity (HCC)     Sleep apnea 07/2020    no cpap       Family History   Problem Relation Age of Onset    Cancer Maternal Grandmother         colon    Diabetes Maternal Aunt     Hypertension Mother     Heart Disease Mother        Current Outpatient Medications   Medication Sig Dispense Refill    albuterol sulfate HFA (PROVENTIL;VENTOLIN;PROAIR) 108 (90 Base) MCG/ACT inhaler Inhale 2 puffs into the lungs every 6 hours as needed      amLODIPine (NORVASC) 5 MG tablet Take 10 mg by mouth daily      cetirizine (ZYRTEC) 10 MG tablet Take 10 mg by mouth daily      fluticasone (FLONASE) 50 MCG/ACT nasal spray 2 sprays by Nasal route daily      lidocaine (XYLOCAINE) 5 % ointment Apply topically as needed      meloxicam (MOBIC) 15 MG tablet Take 1 tab daily as needed pain with food.      metFORMIN (GLUCOPHAGE) 500 MG tablet Take 500 mg by mouth daily (with breakfast)      phentermine (ADIPEX-P) 37.5 MG tablet Take 37.5 mg by mouth.      pregabalin (LYRICA) 150 MG capsule Take 150 mg by mouth 2 times daily.      topiramate (TOPAMAX) 25 MG tablet ceived the following from [facility]: Outside name: topiramate (TOPAMAX) 25 mg tablet       No current facility-administered medications for this visit.       Allergies   Allergen Reactions    Latex Hives    Pollen Extract Cough and Other (See Comments)     Other reaction(s): Sneezing  Irritates asthma    Tomato Hives       Past Surgical History:   Procedure Laterality Date    APPENDECTOMY  2006    BACK SURGERY      CESAREAN SECTION  1987, 1994, 1998    CHOLECYSTECTOMY  1998    COLONOSCOPY N/A 01/25/2020    [facility] Ihor AustinLawson, J Mark[facility]    HYSTERECTOMY (CERVIX STATUS UNKNOWN)  2012    HYSTERECTOMY (CERVIX STATUS UNKNOWN)  uterus removed       Social History     Socioeconomic History    Marital status: Married     Spouse name: Not on file    Number of children: Not on file    Years of education: Not on file    Highest education level: Not on file   Occupational History    Not on file   Tobacco Use    Smoking status: Never    Smokeless tobacco: Never   Substance and Sexual Activity    Alcohol use: Not Currently     Alcohol/week: 1.0 standard drink    Drug use: No    Sexual activity: Not on file   Other Topics Concern    Not on file   Social History Narrative    Not on file     Social Determinants of Health     Financial Resource Strain: Not on file   Food Insecurity: Not on file   Transportation Needs: Not on file   Physical Activity: Not on file   Stress: Not on file   Social Connections: Not on file   Intimate Partner Violence: Not on file   Housing Stability: Not on file       REVIEW OF SYSTEMS:    14 point review of systems on the intake form is negative except as noted in the HPI    PHYSICAL EXAMINATION:  Resp 18    Ht 5\' 4"  (1.626 m)    Wt 279 lb (126.6 kg) Comment: pt reports   BMI 47.89 kg/m??   Body mass index is 47.89 kg/m??.    GENERAL: Alert and oriented x3, in no acute distress, well-developed, well-nourished.  HEENT: Normocephalic, atraumatic.    Elbow  Exam   R   L  ROM       Flexion   Full   Full   Extension  Full   Full   Pronation  Full   Full   Supination  Full   Full  Tenderness to palpation   Medial Epicondyle -   -   Lateral Epicondyle +   -  Tinel Sign Cubital Tunnel -   -  Ulnar Nerve Subluxation -   -  Distal Biceps Abnormality -   -  Triceps Abnormality  -   -  Other tenderness  -   -  Other Deformity  -   -  Olecranon Bursitis  -   -  Warmth   -   -  Erythema   -   -  Additional Findings: Pain with resisted wrist extension with the elbow extended that is less with the elbow flexed.      IMAGING:  Imaging read by myself and interpreted as follows:  MRI of the right elbow was reviewed which shows lateral epicondylitis with tearing of the common extensor tendon from the lateral epicondyle.    ASSESSMENT & PLAN  Diagnosis: Right elbow lateral epicondylitis    [name] has symptomatic chronic right elbow lateral epicondylitis has failed conservative treatment over the past year.  At this point she would like to consider surgery in the form of an open lateral epicondylitis debridement.  We discussed the risk benefits and recovery of surgery.  We also discussed the risk of incomplete resolution of her symptoms.  She is also complaining of some numbness and tingling which I think is very difficult to predict whether that will improve or not after surgery.  Understanding these risks he would  like to move forward with surgery.  We will start the process of getting it set up for her in the near future.  All of her questions were answered.  Her risk factors for surgery include chronicity of symptoms as well as multiple injections.    Prescription medication management discussed.     Electronically signed by: Jim Like, MD    Note: This note was completed using voice recognition software.  Any typographical/name errors or mistakes are unintentional.

## 2021-05-23 NOTE — H&P (View-Only) (Signed)
[facility] OFFICE NOTE      Patient: [name] [name]                MRN: [id number]       SSN: [id number]  Date of Birth: 1965/04/19        AGE: 56 y.o.        SEX: female  Body mass index is 47.89 kg/m??.    PCP: Leta Jungling, MD  05/23/21    CHIEF COMPLAINT: Right elbow pain 10/10    HPI: [name] [name] is a 56 y.o. female patient who presents to the office today with chronic right elbow pain over the lateral epicondyle.  The pain radiates into her forearm.  She has had extensive treatment by Dr. Lexine Baton including injections and therapy exercises without resolution of her symptoms.  She is also imaging including an MRI.    Past Medical History:   Diagnosis Date    Asthma     COVID-19 11/2018, 02/2020    H/O seasonal allergies     Headache     Hx of cholecystectomy 1999    Hypertension     Morbid obesity (HCC)     Sleep apnea 07/2020    no cpap       Family History   Problem Relation Age of Onset    Cancer Maternal Grandmother         colon    Diabetes Maternal Aunt     Hypertension Mother     Heart Disease Mother        Current Outpatient Medications   Medication Sig Dispense Refill    albuterol sulfate HFA (PROVENTIL;VENTOLIN;PROAIR) 108 (90 Base) MCG/ACT inhaler Inhale 2 puffs into the lungs every 6 hours as needed      amLODIPine (NORVASC) 5 MG tablet Take 10 mg by mouth daily      cetirizine (ZYRTEC) 10 MG tablet Take 10 mg by mouth daily      fluticasone (FLONASE) 50 MCG/ACT nasal spray 2 sprays by Nasal route daily      lidocaine (XYLOCAINE) 5 % ointment Apply topically as needed      meloxicam (MOBIC) 15 MG tablet Take 1 tab daily as needed pain with food.      metFORMIN (GLUCOPHAGE) 500 MG tablet Take 500 mg by mouth daily (with breakfast)      phentermine (ADIPEX-P) 37.5 MG tablet Take 37.5 mg by mouth.      pregabalin (LYRICA) 150 MG capsule Take 150 mg by mouth 2 times daily.      topiramate (TOPAMAX) 25 MG tablet ceived the following from [facility]: Outside name: topiramate (TOPAMAX) 25 mg tablet       No current facility-administered medications for this visit.       Allergies   Allergen Reactions    Latex Hives    Pollen Extract Cough and Other (See Comments)     Other reaction(s): Sneezing  Irritates asthma    Tomato Hives       Past Surgical History:   Procedure Laterality Date    APPENDECTOMY  2006    BACK SURGERY      CESAREAN SECTION  1987, 1994, 1998    CHOLECYSTECTOMY  1998    COLONOSCOPY N/A 01/25/2020    COLONOSCOPY performed by Ihor Austin, MD at [facility] ENDOSCOPY    HYSTERECTOMY (CERVIX STATUS UNKNOWN)  2012    HYSTERECTOMY (CERVIX STATUS UNKNOWN)  uterus removed       Social History     Socioeconomic History    Marital status: Married     Spouse name: Not on file    Number of children: Not on file    Years of education: Not on file    Highest education level: Not on file   Occupational History    Not on file   Tobacco Use    Smoking status: Never    Smokeless tobacco: Never   Substance and Sexual Activity    Alcohol use: Not Currently     Alcohol/week: 1.0 standard drink    Drug use: No    Sexual activity: Not on file   Other Topics Concern    Not on file   Social History Narrative    Not on file     Social Determinants of Health     Financial Resource Strain: Not on file   Food Insecurity: Not on file   Transportation Needs: Not on file   Physical Activity: Not on file   Stress: Not on file   Social Connections: Not on file   Intimate Partner Violence: Not on file   Housing Stability: Not on file       REVIEW OF SYSTEMS:    14 point review of systems on the intake form is negative except as noted in the HPI    PHYSICAL EXAMINATION:  Resp 18    Ht 5\' 4"  (1.626 m)    Wt 279 lb (126.6 kg) Comment: pt reports   BMI 47.89 kg/m??   Body mass index is 47.89 kg/m??.    GENERAL: Alert and oriented x3, in no acute distress, well-developed, well-nourished.  HEENT: Normocephalic, atraumatic.    Elbow  Exam   R   L  ROM       Flexion   Full   Full   Extension  Full   Full   Pronation  Full   Full   Supination  Full   Full  Tenderness to palpation   Medial Epicondyle -   -   Lateral Epicondyle +   -  Tinel Sign Cubital Tunnel -   -  Ulnar Nerve Subluxation -   -  Distal Biceps Abnormality -   -  Triceps Abnormality  -   -  Other tenderness  -   -  Other Deformity  -   -  Olecranon Bursitis  -   -  Warmth   -   -  Erythema   -   -  Additional Findings: Pain with resisted wrist extension with the elbow extended that is less with the elbow flexed.      IMAGING:  Imaging read by myself and interpreted as follows:  MRI of the right elbow was reviewed which shows lateral epicondylitis with tearing of the common extensor tendon from the lateral epicondyle.    ASSESSMENT & PLAN  Diagnosis: Right elbow lateral epicondylitis    [name] has symptomatic chronic right elbow lateral epicondylitis has failed conservative treatment over the past year.  At this point she would like to consider surgery in the form of an open lateral epicondylitis debridement.  We discussed the risk benefits and recovery of surgery.  We also discussed the risk of incomplete resolution of her symptoms.  She is also complaining of some numbness and tingling which I think is very difficult to predict whether that will improve or not after surgery.  Understanding these risks he would  like to move forward with surgery.  We will start the process of getting it set up for her in the near future.  All of her questions were answered.  Her risk factors for surgery include chronicity of symptoms as well as multiple injections.    Prescription medication management discussed.     Electronically signed by: Jim Like, MD    Note: This note was completed using voice recognition software.  Any typographical/name errors or mistakes are unintentional.

## 2021-05-28 ENCOUNTER — Encounter

## 2021-06-06 ENCOUNTER — Inpatient Hospital Stay: Admit: 2021-06-06 | Discharge: 2021-06-13 | Payer: PRIVATE HEALTH INSURANCE | Primary: Internal Medicine

## 2021-06-06 ENCOUNTER — Other Ambulatory Visit: Payer: Self-pay

## 2021-06-06 ENCOUNTER — Ambulatory Visit: Payer: BC Managed Care – PPO | Admitting: Nurse Practitioner

## 2021-06-06 ENCOUNTER — Encounter: Payer: Self-pay | Admitting: Nurse Practitioner

## 2021-06-06 VITALS — BP 127/75 | HR 67 | Temp 97.4°F | Wt 160.8 lb

## 2021-06-06 DIAGNOSIS — J301 Allergic rhinitis due to pollen: Secondary | ICD-10-CM | POA: Diagnosis not present

## 2021-06-06 DIAGNOSIS — M7711 Lateral epicondylitis, right elbow: Secondary | ICD-10-CM

## 2021-06-06 LAB — EKG 12-LEAD
Atrial Rate: 90 {beats}/min
Diagnosis: NORMAL
P Axis: 41 degrees
P-R Interval: 164 ms
Q-T Interval: 332 ms
QRS Duration: 76 ms
QTc Calculation (Bazett): 406 ms
R Axis: 61 degrees
T Axis: 42 degrees
Ventricular Rate: 90 {beats}/min

## 2021-06-06 LAB — CBC WITH AUTO DIFFERENTIAL
Absolute Eos #: 0.2 10*3/uL (ref 0.0–0.4)
Absolute Immature Granulocyte: 0 10*3/uL (ref 0.00–0.04)
Absolute Lymph #: 2.1 10*3/uL (ref 0.9–3.6)
Absolute Mono #: 0.5 10*3/uL (ref 0.05–1.2)
Basophils Absolute: 0.1 10*3/uL (ref 0.0–0.1)
Basophils: 1 % (ref 0–2)
Eosinophils %: 4 % (ref 0–5)
Hematocrit: 37.6 % (ref 35.0–45.0)
Hemoglobin: 11.5 g/dL — ABNORMAL LOW (ref 12.0–16.0)
Immature Granulocytes: 0 % (ref 0.0–0.5)
Lymphocytes: 41 % (ref 21–52)
MCH: 24.5 PG (ref 24.0–34.0)
MCHC: 30.6 g/dL — ABNORMAL LOW (ref 31.0–37.0)
MCV: 80 FL (ref 78.0–100.0)
MPV: 9.1 FL — ABNORMAL LOW (ref 9.2–11.8)
Monocytes: 9 % (ref 3–10)
Nucleated RBCs: 0 PER 100 WBC
Platelets: 261 10*3/uL (ref 135–420)
RBC: 4.7 M/uL (ref 4.20–5.30)
RDW: 13.7 % (ref 11.6–14.5)
Seg Neutrophils: 45 % (ref 40–73)
Segs Absolute: 2.3 10*3/uL (ref 1.8–8.0)
WBC: 5.1 10*3/uL (ref 4.6–13.2)
nRBC: 0 10*3/uL (ref 0.00–0.01)

## 2021-06-06 MED ORDER — OLOPATADINE HCL 0.1 % OP SOLN: 1.0000 [drp] | Freq: Two times a day (BID) | OPHTHALMIC | 0 refills | Status: DC | PRN

## 2021-06-06 MED ORDER — FEXOFENADINE HCL 180 MG PO TABS: 180.0000 mg | ORAL_TABLET | Freq: Every day | ORAL | 0 refills | Status: DC

## 2021-06-06 MED ORDER — FLUTICASONE PROPIONATE 50 MCG/ACT NA SUSP: 2.0000 | Freq: Every day | NASAL | 6 refills | Status: DC

## 2021-06-06 NOTE — Progress Notes (Signed)
? ?Acute Office Visit ? ?Subjective:  ? ? Patient ID: Natalie Martinez, female    DOB: [birth year], 56 y.o.   MRN: [id number] ? ?Chief Complaint  ?Patient presents with  ? Sinus Problem  ?  Pt c/o sneezing, itchy/red eyes w/ runny nose.   ? ? ?HPI ?Patient is in today for itchy eyes, runny nose, sneezing and states her allergies are acting up. She has tried zyrtec which helped a little.  She denies fevers, cough, shortness of breath, wheezing.  She is asking if there is an allergy shot that she can get to help her symptoms. ? ?Past Medical History:  ?Diagnosis Date  ? Allergy   ? Arthritis   ? Hypercholesterolemia   ? Neuromuscular disorder ([place])   ? sciatic pain   ? NSVD (normal spontaneous vaginal delivery)   ? X3  ? Vertigo   ? ? ?Past Surgical History:  ?Procedure Laterality Date  ? CERVICAL BIOPSY  W/ LOOP ELECTRODE EXCISION  2004  &  2005  ? X 2  ? [place] OF UTERUS  2000 & 2003  ? VAGINAL HYSTERECTOMY  2005  ? RECURRENT CERVICAL DYSPLASIA  ? ? ?Family History  ?Problem Relation Age of Onset  ? Diabetes Mother   ? Healthy Sister   ? Healthy Brother   ? Healthy Brother   ? Breast cancer Neg Hx   ? Colon cancer Neg Hx   ? Colon polyps Neg Hx   ? Esophageal cancer Neg Hx   ? Rectal cancer Neg Hx   ? Stomach cancer Neg Hx   ? ? ?Social History  ? ?Socioeconomic History  ? Marital status: Single  ?  Spouse name: Not on file  ? Number of children: 3  ? Years of education: 40  ? Highest education level: Not on file  ?Occupational History  ? Occupation: Glass blower/designer  ?Tobacco Use  ? Smoking status: Never  ? Smokeless tobacco: Never  ?Vaping Use  ? Vaping Use: Never used  ?Substance and Sexual Activity  ? Alcohol use: No  ?  Alcohol/week: 0.0 standard drinks  ? Drug use: No  ? Sexual activity: Not Currently  ?  Birth control/protection: None, Surgical  ?  Comment: 1st intercourse- 25, partners- 3,   ?Other Topics Concern  ? Not on file  ?Social History Narrative  ? Lives at home with her  daughter and her mother.  ? Occasional caffeine use (coffee twice weekly).  ? Right-handed.  ? ?Social Determinants of Health  ? ?Financial Resource Strain: Not on file  ?Food Insecurity: Not on file  ?Transportation Needs: Not on file  ?Physical Activity: Not on file  ?Stress: Not on file  ?Social Connections: Not on file  ?Intimate Partner Violence: Not on file  ? ? ?Outpatient Medications Prior to Visit  ?Medication Sig Dispense Refill  ? diclofenac (VOLTAREN) 50 MG EC tablet Take 50 mg by mouth 2 (two) times daily.    ? simvastatin (ZOCOR) 40 MG tablet Take 1 tablet (40 mg total) by mouth at bedtime. 90 tablet 3  ? sulfamethoxazole-trimethoprim (BACTRIM DS) 800-160 MG tablet Take 1 tablet by mouth 2 (two) times daily. 10 tablet 0  ? UNABLE TO FIND Med Name: medicine for arthritis    ? ?No facility-administered medications prior to visit.  ? ? ?Allergies  ?Allergen Reactions  ? Atorvastatin Nausea Only  ? ? ?Review of Systems ?See pertinent positives and negatives per HPI. ?   ?Objective:  ?  ?  Physical Exam ?Vitals and nursing note reviewed.  ?Constitutional:   ?   General: She is not in acute distress. ?   Appearance: Normal appearance.  ?HENT:  ?   Head: Normocephalic.  ?Eyes:  ?   General: Lids are normal.  ?   Conjunctiva/sclera:  ?   Right eye: Right conjunctiva is injected.  ?   Left eye: Left conjunctiva is injected.  ?Cardiovascular:  ?   Rate and Rhythm: Normal rate and regular rhythm.  ?   Pulses: Normal pulses.  ?   Heart sounds: Normal heart sounds.  ?Pulmonary:  ?   Effort: Pulmonary effort is normal.  ?   Breath sounds: Normal breath sounds.  ?Musculoskeletal:  ?   Cervical back: Normal range of motion.  ?Skin: ?   General: Skin is warm.  ?Neurological:  ?   General: No focal deficit present.  ?   Mental Status: She is alert and oriented to person, place, and time.  ?Psychiatric:     ?   Mood and Affect: Mood normal.     ?   Behavior: Behavior normal.     ?   Thought Content: Thought content normal.      ?   Judgment: Judgment normal.  ? ? ?BP 127/75 (BP Location: Left Arm, Patient Position: Sitting, Cuff Size: Normal)   Pulse 67   Temp (!) 97.4 ?F (36.3 ?C) (Temporal)   Wt 160 lb 12.8 oz (72.9 kg)   SpO2 94%   BMI 31.40 kg/m?  ?Wt Readings from Last 3 Encounters:  ?06/06/21 160 lb 12.8 oz (72.9 kg)  ?03/31/21 165 lb 3.2 oz (74.9 kg)  ?01/28/21 163 lb 6.4 oz (74.1 kg)  ? ? ?There are no preventive care reminders to display for this patient. ? ?There are no preventive care reminders to display for this patient. ? ? ?Lab Results  ?Component Value Date  ? TSH 1.70 06/21/2017  ? ?Lab Results  ?Component Value Date  ? WBC 4.2 02/05/2021  ? HGB 13.3 02/05/2021  ? HCT 40.1 02/05/2021  ? MCV 94.4 02/05/2021  ? PLT 251.0 02/05/2021  ? ?Lab Results  ?Component Value Date  ? NA 142 02/05/2021  ? K 4.9 02/05/2021  ? CO2 31 02/05/2021  ? GLUCOSE 88 02/05/2021  ? BUN 14 02/05/2021  ? CREATININE 0.64 02/05/2021  ? BILITOT 0.5 02/05/2021  ? ALKPHOS 90 02/05/2021  ? AST 19 02/05/2021  ? ALT 18 02/05/2021  ? PROT 7.2 02/05/2021  ? ALBUMIN 4.5 02/05/2021  ? CALCIUM 10.1 02/05/2021  ? GFR 101.16 02/05/2021  ? ?Lab Results  ?Component Value Date  ? CHOL 225 (H) 02/05/2021  ? ?Lab Results  ?Component Value Date  ? HDL 49.50 02/05/2021  ? ?Lab Results  ?Component Value Date  ? LDLCALC 140 (H) 02/05/2021  ? ?Lab Results  ?Component Value Date  ? TRIG 177.0 (H) 02/05/2021  ? ?Lab Results  ?Component Value Date  ? CHOLHDL 5 02/05/2021  ? ?No results found for: HGBA1C ? ?   ?Assessment & Plan:  ? ?Problem List Items Addressed This Visit   ?None ?Visit Diagnoses   ? ? Seasonal allergic rhinitis due to pollen    -  Primary  ? Continue Zyrtec daily.  Start Allegra, Flonase daily.  Pataday eyedrops as needed itchy red eyes.  Declines referral to allergist.  F/U if not improving  ? ?  ? ? ? ?Meds ordered this encounter  ?Medications  ? fluticasone (FLONASE) 50 MCG/ACT  nasal spray  ?  Sig: Place 2 sprays into both nostrils daily.  ?   Dispense:  16 g  ?  Refill:  6  ? fexofenadine (ALLEGRA) 180 MG tablet  ?  Sig: Take 1 tablet (180 mg total) by mouth daily.  ?  Dispense:  30 tablet  ?  Refill:  0  ? olopatadine (PATADAY) 0.1 % ophthalmic solution  ?  Sig: Place 1 drop into both eyes 2 (two) times daily as needed for allergies.  ?  Dispense:  5 mL  ?  Refill:  0  ? ? ? ?Charyl Dancer, NP ? ?

## 2021-06-06 NOTE — Patient Instructions (Signed)
It was great to see you! ? ?Start Flonase nasal spray daily.  Also start Allegra 1 tablet daily.  You can continue your Zyrtec daily.  You can also use Pataday eyedrops as needed for itchy, red eyes.  If you would like a referral to an allergist let us know ? ?Let's follow-up if your symptoms do not improve or worsen ? ?Take care, ? ?Vance Peper, NP ? ?

## 2021-06-07 DIAGNOSIS — Z01810 Encounter for preprocedural cardiovascular examination: Secondary | ICD-10-CM

## 2021-06-07 LAB — COMPREHENSIVE METABOLIC PANEL
ALT: 29 U/L (ref 13–56)
AST: 16 U/L (ref 10–38)
Albumin/Globulin Ratio: 1 (ref 0.8–1.7)
Albumin: 3.5 g/dL (ref 3.4–5.0)
Alk Phosphatase: 84 U/L (ref 45–117)
Anion Gap: 2 mmol/L — ABNORMAL LOW (ref 3.0–18)
BUN: 13 MG/DL (ref 7.0–18)
Bun/Cre Ratio: 16 (ref 12–20)
CO2: 29 mmol/L (ref 21–32)
Calcium: 9 MG/DL (ref 8.5–10.1)
Chloride: 109 mmol/L (ref 100–111)
Creatinine: 0.79 MG/DL (ref 0.6–1.3)
Est, Glom Filt Rate: >60 mL/min/{1.73_m2} (ref >60)
Globulin: 3.5 g/dL (ref 2.0–4.0)
Glucose: 122 mg/dL — ABNORMAL HIGH (ref 74–99)
Potassium: 4.1 mmol/L (ref 3.5–5.5)
Sodium: 140 mmol/L (ref 136–145)
Total Bilirubin: 0.4 MG/DL (ref 0.2–1.0)
Total Protein: 7 g/dL (ref 6.4–8.2)

## 2021-06-08 LAB — EKG 12-LEAD
Atrial Rate: 90 {beats}/min
Diagnosis: NORMAL
P Axis: 41 degrees
P-R Interval: 164 ms
Q-T Interval: 332 ms
QRS Duration: 76 ms
QTc Calculation (Bazett): 406 ms
R Axis: 61 degrees
T Axis: 42 degrees
Ventricular Rate: 90 {beats}/min

## 2021-06-13 NOTE — Other (Signed)
PRE-SURGICAL INSTRUCTIONS        Patient's Name:  [name] [name]      WFUXN'A Date:  06/13/2021            Covid Testing Date and Time:    Surgery Date:  06/16/2021              Do NOT eat or drink anything, including candy, gum, or ice chips after midnight on 03/26, unless you have specific instructions from your surgeon or anesthesia provider to do so.  You may brush your teeth before coming to the hospital.  No smoking 24 hours prior to the day of surgery.  No alcohol 24 hours prior to the day of surgery.  No recreational drugs for one week prior to the day of surgery.  Leave all valuables, including money/purse, at home.  Remove all jewelry, nail polish, acrylic nails, and makeup (including mascara); no lotions powders, deodorant, or perfume/cologne/after shave on the skin.  Follow instruction for Hibiclens washes and CHG wipes from surgeon's office.   Glasses/contact lenses and dentures may be worn to the hospital.  They will be removed prior to surgery.  Call your doctor if symptoms of a cold or illness develop within 24-48 hours prior to your surgery.  11.  If you are having an outpatient procedure, please make arrangements for a responsible ADULT TO DRIVE YOU HOME AFTER SURGERY and stay with you for 24 hours after your surgery.  12.  ONE VISITOR in the hospital at this time for outpatient procedures.  Exceptions may be made for surgical admissions, per nursing unit guidelines      Special Instructions:      Bring list of CURRENT medications.  Bring inhaler.  Bring any pertinent legal medical records.  Take these medications the morning of surgery with a sip of water:  Amlodipine  Follow physician instructions about stopping anticoagulants.      On the day of surgery, come in the main entrance of [facility].  Let the security guard at the desk know you are there for surgery.  A staff member will come escort you to the surgical area on the second floor.    If you have any questions or concerns,  please do not hesitate to call:     (Prior to the day of surgery) PAT department:  [phone]   (Day of surgery) Pre-Op department:  [phone]    These surgical instructions were reviewed with Steward Drone during the PAT phone call.

## 2021-06-16 ENCOUNTER — Inpatient Hospital Stay: Payer: PRIVATE HEALTH INSURANCE

## 2021-06-16 MED ORDER — NORMAL SALINE FLUSH 0.9 % IV SOLN
0.9 % | INTRAVENOUS | Status: DC | PRN
Start: 2021-06-16 — End: 2021-06-16

## 2021-06-16 MED ORDER — DEXAMETHASONE SODIUM PHOSPHATE 4 MG/ML IJ SOLN
4 MG/ML | INTRAMUSCULAR | Status: DC | PRN
Start: 2021-06-16 — End: 2021-06-16
  Administered 2021-06-16: 15:00:00 4 via INTRAVENOUS

## 2021-06-16 MED ORDER — LIDOCAINE HCL (PF) 2 % IJ SOLN
2 % | INTRAMUSCULAR | Status: AC
Start: 2021-06-16 — End: ?

## 2021-06-16 MED ORDER — ROPIVACAINE HCL 5 MG/ML IJ SOLN
INTRAMUSCULAR | Status: AC
Start: 2021-06-16 — End: 2021-06-16
  Administered 2021-06-16: 14:00:00 20 via PERINEURAL

## 2021-06-16 MED ORDER — SUCCINYLCHOLINE CHLORIDE 100 MG/5ML IV SOSY
100 MG/5ML | INTRAVENOUS | Status: AC
Start: 2021-06-16 — End: ?

## 2021-06-16 MED ORDER — ROCURONIUM BROMIDE 50 MG/5ML IV SOLN
50 MG/5ML | INTRAVENOUS | Status: DC | PRN
Start: 2021-06-16 — End: 2021-06-16
  Administered 2021-06-16: 15:00:00 25 via INTRAVENOUS

## 2021-06-16 MED ORDER — ACETAMINOPHEN 500 MG PO TABS
500 MG | Freq: Once | ORAL | Status: AC
Start: 2021-06-16 — End: 2021-06-16
  Administered 2021-06-16: 13:00:00 1000 mg via ORAL

## 2021-06-16 MED ORDER — FENTANYL CITRATE (PF) 100 MCG/2ML IJ SOLN
1002 MCG/2ML | Freq: Once | INTRAMUSCULAR | Status: AC
Start: 2021-06-16 — End: 2021-06-16
  Administered 2021-06-16: 14:00:00 100 ug via INTRAVENOUS

## 2021-06-16 MED ORDER — FENTANYL CITRATE (PF) 100 MCG/2ML IJ SOLN
1002 MCG/2ML | INTRAMUSCULAR | Status: DC | PRN
Start: 2021-06-16 — End: 2021-06-16
  Administered 2021-06-16: 15:00:00 100 via INTRAVENOUS

## 2021-06-16 MED ORDER — ONDANSETRON HCL 4 MG/2ML IJ SOLN
4 MG/2ML | INTRAMUSCULAR | Status: DC | PRN
Start: 2021-06-16 — End: 2021-06-16
  Administered 2021-06-16: 15:00:00 4 via INTRAVENOUS

## 2021-06-16 MED ORDER — LACTATED RINGERS IV SOLN
INTRAVENOUS | Status: DC | PRN
Start: 2021-06-16 — End: 2021-06-16
  Administered 2021-06-16: 15:00:00 via INTRAVENOUS

## 2021-06-16 MED ORDER — STERILE WATER FOR INJECTION (MIXTURES ONLY)
1 g | INTRAMUSCULAR | Status: AC
Start: 2021-06-16 — End: 2021-06-16
  Administered 2021-06-16: 15:00:00 3000 mg via INTRAVENOUS

## 2021-06-16 MED ORDER — OXYCODONE-ACETAMINOPHEN 5-325 MG PO TABS
5-325 MG | ORAL_TABLET | ORAL | 0 refills | Status: AC | PRN
Start: 2021-06-16 — End: 2021-06-23

## 2021-06-16 MED ORDER — SUCCINYLCHOLINE CHLORIDE 100 MG/5ML IV SOSY
100 MG/5ML | INTRAVENOUS | Status: DC | PRN
Start: 2021-06-16 — End: 2021-06-16
  Administered 2021-06-16: 15:00:00 100 via INTRAVENOUS

## 2021-06-16 MED ORDER — ROCURONIUM BROMIDE 50 MG/5ML IV SOLN
50 MG/5ML | INTRAVENOUS | Status: AC
Start: 2021-06-16 — End: ?

## 2021-06-16 MED ORDER — KETOROLAC TROMETHAMINE 15 MG/ML IJ SOLN
15 MG/ML | INTRAMUSCULAR | Status: DC | PRN
Start: 2021-06-16 — End: 2021-06-16
  Administered 2021-06-16: 15:00:00 30 via INTRAVENOUS

## 2021-06-16 MED ORDER — MIDAZOLAM HCL (PF) 2 MG/2ML IJ SOLN
2 MG/ML | Freq: Once | INTRAMUSCULAR | Status: AC
Start: 2021-06-16 — End: 2021-06-16
  Administered 2021-06-16: 14:00:00 2 mg via INTRAVENOUS

## 2021-06-16 MED ORDER — SODIUM CHLORIDE 0.9 % IR SOLN
0.9 % | Status: DC | PRN
Start: 2021-06-16 — End: 2021-06-16
  Administered 2021-06-16: 15:00:00 50

## 2021-06-16 MED ORDER — PROPOFOL 200 MG/20ML IV EMUL
20020 MG/20ML | INTRAVENOUS | Status: DC | PRN
Start: 2021-06-16 — End: 2021-06-16
  Administered 2021-06-16: 15:00:00 150 via INTRAVENOUS

## 2021-06-16 MED ORDER — DEXAMETHASONE SODIUM PHOSPHATE 4 MG/ML IJ SOLN
4 MG/ML | INTRAMUSCULAR | Status: AC
Start: 2021-06-16 — End: ?

## 2021-06-16 MED ORDER — NORMAL SALINE FLUSH 0.9 % IV SOLN
0.9 % | Freq: Two times a day (BID) | INTRAVENOUS | Status: DC
Start: 2021-06-16 — End: 2021-06-16

## 2021-06-16 MED ORDER — LIDOCAINE HCL (PF) 2 % IJ SOLN
2 % | INTRAMUSCULAR | Status: DC | PRN
Start: 2021-06-16 — End: 2021-06-16
  Administered 2021-06-16: 15:00:00 40 via INTRAVENOUS

## 2021-06-16 MED ORDER — MIDAZOLAM HCL 2 MG/2ML IJ SOLN
2 MG/ML | INTRAMUSCULAR | Status: DC | PRN
Start: 2021-06-16 — End: 2021-06-16
  Administered 2021-06-16: 15:00:00 2 via INTRAVENOUS

## 2021-06-16 MED ORDER — ROPIVACAINE HCL 5 MG/ML IJ SOLN
Freq: Once | INTRAMUSCULAR | Status: AC
Start: 2021-06-16 — End: 2021-06-16
  Administered 2021-06-16: 14:00:00 30 mL

## 2021-06-16 MED ORDER — PROPOFOL 200 MG/20ML IV EMUL
20020 MG/20ML | INTRAVENOUS | Status: AC
Start: 2021-06-16 — End: ?

## 2021-06-16 MED ORDER — ONDANSETRON HCL 4 MG/2ML IJ SOLN
42 MG/2ML | INTRAMUSCULAR | Status: AC
Start: 2021-06-16 — End: ?

## 2021-06-16 MED ORDER — MIDAZOLAM HCL 2 MG/2ML IJ SOLN
2 MG/ML | INTRAMUSCULAR | Status: AC
Start: 2021-06-16 — End: ?

## 2021-06-16 MED ORDER — FAMOTIDINE 20 MG PO TABS
20 MG | Freq: Once | ORAL | Status: AC
Start: 2021-06-16 — End: 2021-06-16
  Administered 2021-06-16: 13:00:00 20 mg via ORAL

## 2021-06-16 MED ORDER — SODIUM CHLORIDE 0.9 % IV SOLN
0.9 % | INTRAVENOUS | Status: DC | PRN
Start: 2021-06-16 — End: 2021-06-16

## 2021-06-16 MED ORDER — LACTATED RINGERS IV SOLN
INTRAVENOUS | Status: DC
Start: 2021-06-16 — End: 2021-06-16
  Administered 2021-06-16 (×2): via INTRAVENOUS

## 2021-06-16 MED ORDER — LIDOCAINE HCL (PF) 1 % IJ SOLN
1 % | Freq: Once | INTRAMUSCULAR | Status: AC | PRN
Start: 2021-06-16 — End: 2021-06-16
  Administered 2021-06-16: 14:00:00 1 mL via INTRADERMAL

## 2021-06-16 MED ORDER — KETOROLAC TROMETHAMINE 15 MG/ML IJ SOLN
15 MG/ML | INTRAMUSCULAR | Status: AC
Start: 2021-06-16 — End: ?

## 2021-06-16 MED ORDER — FENTANYL CITRATE (PF) 100 MCG/2ML IJ SOLN
100 MCG/2ML | INTRAMUSCULAR | Status: AC
Start: 2021-06-16 — End: ?

## 2021-06-16 MED FILL — XYLOCAINE-MPF 2 % IJ SOLN: 2 % | INTRAMUSCULAR | Qty: 5

## 2021-06-16 MED FILL — LIDOCAINE HCL (PF) 1 % IJ SOLN: 1 % | INTRAMUSCULAR | Qty: 2

## 2021-06-16 MED FILL — FENTANYL CITRATE (PF) 100 MCG/2ML IJ SOLN: 100 MCG/2ML | INTRAMUSCULAR | Qty: 2

## 2021-06-16 MED FILL — ACETAMINOPHEN EXTRA STRENGTH 500 MG PO TABS: 500 MG | ORAL | Qty: 2

## 2021-06-16 MED FILL — KETOROLAC TROMETHAMINE 15 MG/ML IJ SOLN: 15 MG/ML | INTRAMUSCULAR | Qty: 2

## 2021-06-16 MED FILL — DIPRIVAN 200 MG/20ML IV EMUL: 200 MG/20ML | INTRAVENOUS | Qty: 20

## 2021-06-16 MED FILL — DEXAMETHASONE SODIUM PHOSPHATE 4 MG/ML IJ SOLN: 4 MG/ML | INTRAMUSCULAR | Qty: 1

## 2021-06-16 MED FILL — ROPIVACAINE HCL 5 MG/ML IJ SOLN: INTRAMUSCULAR | Qty: 30

## 2021-06-16 MED FILL — MIDAZOLAM HCL 2 MG/2ML IJ SOLN: 2 MG/ML | INTRAMUSCULAR | Qty: 2

## 2021-06-16 MED FILL — BD POSIFLUSH 0.9 % IV SOLN: 0.9 % | INTRAVENOUS | Qty: 40

## 2021-06-16 MED FILL — ONDANSETRON HCL 4 MG/2ML IJ SOLN: 4 MG/2ML | INTRAMUSCULAR | Qty: 2

## 2021-06-16 MED FILL — ROCURONIUM BROMIDE 50 MG/5ML IV SOLN: 50 MG/5ML | INTRAVENOUS | Qty: 5

## 2021-06-16 MED FILL — FAMOTIDINE 20 MG PO TABS: 20 MG | ORAL | Qty: 1

## 2021-06-16 MED FILL — LACTATED RINGERS IV SOLN: INTRAVENOUS | Qty: 1000

## 2021-06-16 MED FILL — SUCCINYLCHOLINE CHLORIDE 100 MG/5ML IV SOSY: 100 MG/5ML | INTRAVENOUS | Qty: 5

## 2021-06-16 MED FILL — CEFAZOLIN SODIUM 1 G IJ SOLR: 1 g | INTRAMUSCULAR | Qty: 3000

## 2021-06-16 NOTE — Other (Signed)
Spoke with patient's daughter Trula Ore  at 18:40 and Mrs. Draughon is resting. Daughter reminded to encourage Mom to eat lots of popsicles to receive adequate hydration.

## 2021-06-16 NOTE — Anesthesia Pre-Procedure Evaluation (Signed)
Department of Anesthesiology  Preprocedure Note       Name:  [name] [name]   Age:  56 y.o.  DOB:  [birth year]                                          MRN:  [id number]         Date:  06/16/2021      Surgeon: Moishe SpiceSurgeon(s):  Jim Likeaniel T Huttman, MD    Procedure: Procedure(s):  RIGHT ELBOW LATERAL EPICONDYLITIS DEBRIDEMENT; NERVE BLOCK    Medications prior to admission:   Prior to Admission medications    Medication Sig Start Date End Date Taking? Authorizing Provider   Multiple Vitamins-Minerals (THERAPEUTIC MULTIVITAMIN-MINERALS) tablet Take 1 tablet by mouth daily   Yes Historical Provider, MD   amLODIPine (NORVASC) 10 MG tablet Take 10 mg by mouth daily 05/11/21   Historical Provider, MD   albuterol sulfate HFA (PROVENTIL;VENTOLIN;PROAIR) 108 (90 Base) MCG/ACT inhaler Inhale 2 puffs into the lungs every 6 hours as needed 12/06/13   Ar Automatic Reconciliation   cetirizine (ZYRTEC) 10 MG tablet Take 10 mg by mouth daily 12/06/13   Ar Automatic Reconciliation   fluticasone (FLONASE) 50 MCG/ACT nasal spray 2 sprays by Nasal route daily 12/06/13   Ar Automatic Reconciliation   metFORMIN (GLUCOPHAGE) 500 MG tablet Take 500 mg by mouth daily (with breakfast)    Ar Automatic Reconciliation       Current medications:    Current Facility-Administered Medications   Medication Dose Route Frequency Provider Last Rate Last Admin   ??? lidocaine PF 1 % injection 1 mL  1 mL IntraDERmal Once PRN Milus GlazierAdrian A Caras, APRN - CRNA       ??? lactated ringers IV soln infusion   IntraVENous Continuous Milus GlazierAdrian A Caras, APRN - CRNA 125 mL/hr at 06/16/21 0918 New Bag at 06/16/21 [phone]   ??? sodium chloride flush 0.9 % injection 5-40 mL  5-40 mL IntraVENous PRN Milus GlazierAdrian A Caras, APRN - CRNA       ??? midazolam PF (VERSED) injection 2 mg  2 mg IntraVENous Once Milus GlazierAdrian A Caras, APRN - CRNA       ??? fentaNYL (SUBLIMAZE) injection 100 mcg  100 mcg IntraVENous Once Milus GlazierAdrian A Caras, APRN - CRNA       ??? ropivacaine (NAROPIN) 0.5% injection 30 mL  30 mL Other Once Milus GlazierAdrian A  Caras, APRN - CRNA       ??? sodium chloride flush 0.9 % injection 5-40 mL  5-40 mL IntraVENous 2 times per day Jim Likeaniel T Huttman, MD       ??? sodium chloride flush 0.9 % injection 5-40 mL  5-40 mL IntraVENous PRN Jim Likeaniel T Huttman, MD       ??? 0.9 % sodium chloride infusion   IntraVENous PRN Jim Likeaniel T Huttman, MD       ??? ceFAZolin (ANCEF) 3,000 mg in sterile water 30 mL IV Syringe  3,000 mg IntraVENous On Call to OR Jim Likeaniel T Huttman, MD           Allergies:    Allergies   Allergen Reactions   ??? Latex Hives   ??? Pollen Extract Cough and Other (See Comments)     Other reaction(s): Sneezing  Irritates asthma   ??? Tomato Hives       Problem List:    Patient Active Problem List   Diagnosis Code   ???  Episodic tension-type headache, not intractable G44.219   ??? Cervical polyp N84.1   ??? Mild persistent asthma in adult without complication J45.30   ??? Obesity, Class III, BMI 40-49.9 (morbid obesity) (HCC) E66.01   ??? Perennial allergic rhinitis with seasonal variation J30.89, J30.2   ??? 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 Asthma    ??? COVID-19 11/2018, 02/2020   ??? H/O seasonal allergies    ??? Hx of cholecystectomy 1999   ??? Hypertension    ??? Morbid obesity [facility])        Past Surgical History:        Procedure Laterality Date   ??? APPENDECTOMY  2006   ??? CESAREAN SECTION  1987, 1994, 1998   ??? CHOLECYSTECTOMY  1998   ??? COLONOSCOPY N/A 01/25/2020    COLONOSCOPY performed by Ihor Austin, MD at [facility] ENDOSCOPY   ??? HYSTERECTOMY (CERVIX STATUS UNKNOWN)  2012    Partial   ??? LUMBAR DISC SURGERY  2022       Social History:    Social History     Tobacco Use   ??? Smoking status: Never   ??? Smokeless tobacco: Never   Substance Use Topics   ??? Alcohol use: Never     Alcohol/week: 1.0 standard drink                                Counseling given: Not Answered      Vital Signs (Current):   Vitals:    06/13/21 1134 06/16/21 0924   BP:  131/60   Pulse:  85   Resp:  16   Temp:  98.3  ??F (36.8 ??C)   TempSrc:  Oral   SpO2:  98%   Weight: 279 lb (126.6 kg) 277 lb 9.6 oz (125.9 kg)   Height:  (1.626 m)                                               BP Readings from Last 3 Encounters:   06/16/21 131/60   09/01/20 124/80   08/29/20 122/74       NPO Status: Time of last liquid consumption: 2100                        Time of last solid consumption: 2100                        Date of last liquid consumption: 06/15/21                        Date of last solid food consumption: 06/15/21    BMI:   Wt Readings from Last 3 Encounters:   06/16/21 277 lb 9.6 oz (125.9 kg)   05/23/21 279 lb (126.6 kg)   04/28/21 279 lb (126.6 kg)     Body mass index is 47.65 kg/m??.    CBC:   Lab Results   Component Value Date/Time    WBC 5.1 06/06/2021 01:06 PM    RBC 4.70 06/06/2021 01:06 PM    HGB 11.5 06/06/2021 01:06 PM  HCT 37.6 06/06/2021 01:06 PM    MCV 80.0 06/06/2021 01:06 PM    RDW 13.7 06/06/2021 01:06 PM    PLT 261 06/06/2021 01:06 PM       CMP:   Lab Results   Component Value Date/Time    NA 140 06/06/2021 01:06 PM    K 4.1 06/06/2021 01:06 PM    CL 109 06/06/2021 01:06 PM    CO2 29 06/06/2021 01:06 PM    BUN 13 06/06/2021 01:06 PM    CREATININE 0.79 06/06/2021 01:06 PM    GFRAA >60 08/24/2020 06:16 AM    AGRATIO 0.8 08/24/2020 06:16 AM    LABGLOM >60 06/06/2021 01:06 PM    GLUCOSE 122 06/06/2021 01:06 PM    PROT 7.0 06/06/2021 01:06 PM    CALCIUM 9.0 06/06/2021 01:06 PM    BILITOT 0.4 06/06/2021 01:06 PM    ALKPHOS 84 06/06/2021 01:06 PM    ALKPHOS 77 08/24/2020 06:16 AM    AST 16 06/06/2021 01:06 PM    ALT 29 06/06/2021 01:06 PM       POC Tests: No results for input(s): POCGLU, POCNA, POCK, POCCL, POCBUN, POCHEMO, POCHCT in the last 72 hours.    Coags: No results found for: PROTIME, INR, APTT    HCG (If Applicable): No results found for: PREGTESTUR, PREGSERUM, HCG, HCGQUANT     ABGs: No results found for: PHART, PO2ART, PCO2ART, HCO3ART, BEART, O2SATART     Type & Screen (If Applicable):  No results found  for: LABABO, LABRH    Drug/Infectious Status (If Applicable):  No results found for: HIV, HEPCAB    COVID-19 Screening (If Applicable):   Lab Results   Component Value Date/Time    COVID19 Not Detected 01/19/2020 02:59 PM           Anesthesia Evaluation  Patient summary reviewed  Airway: Mallampati: II  TM distance: >3 FB   Neck ROM: full  Mouth opening: > = 3 FB   Dental: normal exam         Pulmonary:normal exam  breath sounds clear to auscultation  (+) shortness of breath:  sleep apnea:  asthma:           Patient did not smoke on day of surgery.                 Cardiovascular:  Exercise tolerance: poor (<4 METS),   (+) hypertension:,                   Neuro/Psych:   (+) headaches:,              ROS comment: Chronic pain GI/Hepatic/Renal: Neg GI/Hepatic/Renal ROS            Endo/Other:    (+) blood dyscrasia: anemia, arthritis:., .          Pt had PAT visit.       Abdominal:             Vascular: negative vascular ROS.         Other Findings:           Anesthesia Plan      general     ASA 3       Induction: intravenous.    MIPS: Prophylactic antiemetics administered.  Anesthetic plan and risks discussed with patient.              Post-op pain plan if not by surgeon: single peripheral nerve block  Joseph Art, MD   06/16/2021

## 2021-06-16 NOTE — Progress Notes (Signed)
[employer] faxed disability claim form to [employer]. Blank copy scanned into CC. Sent to HS for completion.

## 2021-06-16 NOTE — Anesthesia Procedure Notes (Signed)
Peripheral Block    Patient location during procedure: holding area  Reason for block: post-op pain management  Start time: 06/16/2021 10:20 AM  End time: 06/16/2021 10:20 AM  Staffing  Performed: anesthesiologist   Preanesthetic Checklist  Completed: patient identified, IV checked, site marked, risks and benefits discussed, surgical/procedural consents, equipment checked, pre-op evaluation, timeout performed, anesthesia consent given, oxygen available, monitors applied/VS acknowledged, fire risk safety assessment completed and verbalized and blood product R/B/A discussed and consented  Peripheral Block   Patient position: sitting  Prep: ChloraPrep  Provider prep: mask and sterile gown  Patient monitoring: cardiac monitor, continuous pulse ox, frequent blood pressure checks, IV access, oxygen and responsive to questions  Block type: Brachial plexus  Interscalene  Laterality: right  Injection technique: single-shot  Guidance: nerve stimulator  Local infiltration: lidocaine  Infiltration strength: 2 %  Local infiltration: lidocaine  Dose: 3 mL    Needle   Needle type: pencil-tip   Needle gauge: 20 G  Needle localization: ultrasound guidance  Test dose: negative  Needle length: 5 cm  Assessment   Injection assessment: negative aspiration for heme, no paresthesia on injection and no intravascular symptoms  Paresthesia pain: none  Slow fractionated injection: yes  Hemodynamics: stable  Real-time [place] image taken/store: yes  Outcomes: uncomplicated    Medications Administered  ropivacaine (NAROPIN) injection 0.5% - Perineural   20 mL - 06/16/2021 10:21:00 AM

## 2021-06-16 NOTE — Brief Op Note (Signed)
Brief Postoperative Note      Patient: [name] [name]  Date of Birth: [birth year]  MRN: [id number]    Date of Procedure: 06/16/2021    Pre-Op Diagnosis: Lateral epicondylitis of right elbow [M77.11]    Post-Op Diagnosis: Same       Procedure(s):  RIGHT ELBOW LATERAL EPICONDYLITIS DEBRIDEMENT; NERVE BLOCK    Surgeon(s):  Jim Like, MD    Assistant:  Surgical Assistant: Shelby Mattocks    Anesthesia: General    Estimated Blood Loss (mL): Minimal    Complications: None    Specimens:   * No specimens in log *    Implants:  * No implants in log *      Drains: * No LDAs found *    Findings: Right elbow lateral epicondylitis    Electronically signed by Jim Like, MD on 06/16/2021 at 11:20 AM

## 2021-06-16 NOTE — Discharge Instructions (Signed)
DISCHARGE SUMMARY from Nurse    PATIENT INSTRUCTIONS:    After general anesthesia or intravenous sedation, for 24 hours or while taking prescription Narcotics:  Limit your activities  Do not drive and operate hazardous machinery  Do not make important personal or business decisions  Do  not drink alcoholic beverages  If you have not urinated within 8 hours after discharge, please contact your surgeon on call.    Report the following to your surgeon:  Excessive pain, swelling, redness or odor of or around the surgical area  Temperature over 100.5  Nausea and vomiting lasting longer than 4 hours or if unable to take medications  Any signs of decreased circulation or nerve impairment to extremity: change in color, persistent  numbness, tingling, coldness or increase pain  Any questions          These are general instructions for a healthy lifestyle:    No smoking/ No tobacco products/ Avoid exposure to second hand smoke  Surgeon General's Warning:  Quitting smoking now greatly reduces serious risk to your health.    Obesity, smoking, and sedentary lifestyle greatly increases your risk for illness    A healthy diet, regular physical exercise & weight monitoring are important for maintaining a healthy lifestyle    You may be retaining fluid if you have a history of heart failure or if you experience any of the following symptoms:  Weight gain of 3 pounds or more overnight or 5 pounds in a week, increased swelling in our hands or feet or shortness of breath while lying flat in bed.  Please call your doctor as soon as you notice any of these symptoms; do not wait until your next office visit.        The discharge information has been reviewed with the patient and Trula Ore.  The patient and christina verbalized understanding.  Discharge medications reviewed with the patient and friend   and appropriate educational materials and side effects teaching were  provided.  ___________________________________________________________________________________________________________________________________

## 2021-06-16 NOTE — Other (Signed)
Dr. Sande Rives at  bedside talking to patient; as on three separate occasions patient was given one sip of ginger ale through a straw and coughed. Patient has tolerated ice-chips well sucking same with outt coughing. 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 The nerve block normal lasts 18-36 hrs. Same spoke with patient  to take fluids slowly and carefully.until nerve block wears off. Patient's husband Mr. Stefanick had called when Dr.Brummett arrived at her bedside and I was able to inform him of Dr. Acquanetta Belling instructions after Dr. Skip Mayer visit whth   Mrs Fidalgo. Both verbalized understanding.

## 2021-06-16 NOTE — Interval H&P Note (Signed)
Update History & Physical    The patient's History and Physical of May 23, 2021 was reviewed with the patient and I examined the patient. There was no change. The surgical site was confirmed by the patient and me.     Plan: The risks, benefits, expected outcome, and alternative to the recommended procedure have been discussed with the patient. Patient understands and wants to proceed with the procedure.     Electronically signed by Jim Like, MD on 06/16/2021 at 9:46 AM

## 2021-06-16 NOTE — Anesthesia Post-Procedure Evaluation (Signed)
Department of Anesthesiology  Postprocedure Note    Patient: [name] [name]  MRN: [id number]  Birthdate: 1966-02-24  Date of evaluation: 06/16/2021      Procedure Summary     Date: 06/16/21 Room / Location: [facility] MAIN 05 / [facility] MAIN OR    Anesthesia Start: 1026 Anesthesia Stop: 1129    Procedure: RIGHT ELBOW LATERAL EPICONDYLITIS DEBRIDEMENT; NERVE BLOCK (Right: Elbow) Diagnosis:       Lateral epicondylitis of right elbow      (Lateral epicondylitis of right elbow [M77.11])    Surgeons: Jim Like, MD Responsible Provider: Joseph Art, MD    Anesthesia Type: general ASA Status: 3          Anesthesia Type: No value filed.    Aldrete Phase I: Aldrete Score: 10    Aldrete Phase II:        Anesthesia Post Evaluation    Patient location during evaluation: PACU  Patient participation: complete - patient participated  Level of consciousness: awake and alert  Pain score: 3  Airway patency: patent  Nausea & Vomiting: no nausea and no vomiting  Complications: no  Cardiovascular status: blood pressure returned to baseline  Respiratory status: acceptable  Hydration status: euvolemic  Multimodal analgesia pain management approach

## 2021-06-16 NOTE — Other (Signed)
Patient instructed to eat pudding consistency foods until right arm nerve block is absorbed and right vocal cord paralysis is resolved. Patient tolerating spoon liquids well without coughing. Slight clearing of her throat noted.

## 2021-06-16 NOTE — Op Note (Signed)
Operative Note      Patient: [name] [name]  Date of Birth: 1966-01-18  MRN: [id number]    Date of Procedure: 06/16/2021    Pre-Op Diagnosis: Lateral epicondylitis of right elbow [M77.11]    Post-Op Diagnosis: Same       Procedure(s):  RIGHT ELBOW LATERAL EPICONDYLITIS DEBRIDEMENT; NERVE BLOCK    Surgeon(s):  Jim Like, MD    Assistant:   Surgical Assistant: Shelby Mattocks    Anesthesia: General    Estimated Blood Loss (mL): Minimal    Complications: None    Specimens:   * No specimens in log *    Implants:  * No implants in log *    Findings: Right elbow lateral epicondylitis    Indication for the procedure: The patient is a 56 year old female presents to the office with right elbow pain recalcitrant to conservative management.  She was diagnosed with lateral condylitis.  After discussing treatment options at length occluding operative and nonoperative management she elected undergo the procedure as described.    Detailed Description of Procedure:   The patient was seen in the preoperative holding area.  The right elbow was marked as the operative extremity after confirmation with the patient.  After discussing risk benefits the procedure informed consent was confirmed.  Anesthesia performed a nerve block.  The patient is taken to the operating room.  She is moving the stretcher to the OR table.  General anesthesia was induced and LMA was placed.  A well-padded nonsterile tourniquet was placed about the right upper arm prior to prepping and draping.  The right arm was then prepped and draped in normal sterile fashion.  Timeout was performed.  Antibiotics were given prior to skin incision.    Skin was incised just anterior to the lateral epicondyle.  Full-thickness skin flaps were raised to the subcutaneous tissue down to the level of the fascia overlying the muscle of the lateral elbow.  The fascia was incised in line with the skin and the overlying muscle belly was elevated off of the extensor carpi  radialis brevis tendon coming off of the lateral epicondyle.  The tendon was noted to be tendinopathic with several areas of tearing.  This area was excised down to the level of the joint.  All pathologic appearing tissue was excised.  The underlying bone was rongeured and decorticated.  No further pathologic appearing tissue was noted.  The wound was copiously irrigated.  The fascia was reapproximated.  The skin was closed in standard fashion.  A sterile dressing was placed over the right arm.  The drapes taken down.  The tourniquet was let down.  The patient was then awakened from anesthesia and taken to PACU in stable condition with a plan for discharge home.    Electronically signed by Jim Like, MD on 06/16/2021 at 11:20 AM

## 2021-06-24 ENCOUNTER — Ambulatory Visit
Admit: 2021-06-24 | Discharge: 2021-06-24 | Payer: PRIVATE HEALTH INSURANCE | Attending: Orthopaedic Surgery | Primary: Internal Medicine

## 2021-06-24 DIAGNOSIS — M7711 Lateral epicondylitis, right elbow: Secondary | ICD-10-CM

## 2021-06-24 NOTE — Progress Notes (Signed)
Patient dropped of Return To Work Form to be completed. Once completed patient can be reached at [phone].

## 2021-06-24 NOTE — Progress Notes (Signed)
[facility] AMBULATORY OFFICE NOTE    Patient: [name] [name]                MRN: [id number]       SSN: [id number]  Date of Birth: 09/13/1965        AGE: 56 y.o.        SEX: female  Body mass index is 47.65 kg/m.    PCP: Leta Jungling, MD  06/24/21    Chief Complaint: Right elbow follow-up    HPI: [name] [name] is a 56 y.o. female patient who returns to the office today for her right elbow.  She is 1 week out from right elbow surgery.  Overall she is doing well.  She has minimal complaints of pain today.    Past Medical History:   Diagnosis Date    Asthma     COVID-19 11/2018, 02/2020    H/O seasonal allergies     Hx of cholecystectomy 1999    Hypertension     Morbid obesity (HCC)        Family History   Problem Relation Age of Onset    Cancer Maternal Grandmother         colon    Diabetes Maternal Aunt     Hypertension Mother     Heart Disease Mother        Current Outpatient Medications   Medication Sig Dispense Refill    amLODIPine (NORVASC) 10 MG tablet Take 10 mg by mouth daily      Multiple Vitamins-Minerals (THERAPEUTIC MULTIVITAMIN-MINERALS) tablet Take 1 tablet by mouth daily      albuterol sulfate HFA (PROVENTIL;VENTOLIN;PROAIR) 108 (90 Base) MCG/ACT inhaler Inhale 2 puffs into the lungs every 6 hours as needed      cetirizine (ZYRTEC) 10 MG tablet Take 10 mg by mouth daily      fluticasone (FLONASE) 50 MCG/ACT nasal spray 2 sprays by Nasal route daily      metFORMIN (GLUCOPHAGE) 500 MG tablet Take 500 mg by mouth daily (with breakfast)       No current facility-administered medications for this visit.       Allergies   Allergen Reactions    Latex Hives    Pollen Extract Cough and Other (See Comments)     Other reaction(s): Sneezing  Irritates asthma    Tomato Hives       Past Surgical History:   Procedure Laterality Date    APPENDECTOMY  2006    ARM SURGERY Right 06/16/2021    RIGHT ELBOW LATERAL EPICONDYLITIS DEBRIDEMENT; NERVE BLOCK performed by Jim Like,  MD at [facility] MAIN OR    CESAREAN SECTION  1987, 1994, 1998    CHOLECYSTECTOMY  1998    COLONOSCOPY N/A 01/25/2020    COLONOSCOPY performed by Ihor Austin, MD at [facility] ENDOSCOPY    HYSTERECTOMY (CERVIX STATUS UNKNOWN)  2012    Partial    LUMBAR DISC SURGERY  2022       Social History     Socioeconomic History    Marital status: Married     Spouse name: Not on file    Number of children: Not on file    Years of education: Not on file    Highest education level: Not on file   Occupational History    Not on file   Tobacco Use    Smoking status: Never    Smokeless tobacco: Never   Vaping Use  Vaping Use: Never used   Substance and Sexual Activity    Alcohol use: Never     Alcohol/week: 1.0 standard drink    Drug use: Never    Sexual activity: Not on file   Other Topics Concern    Not on file   Social History Narrative    Not on file     Social Determinants of Health     Financial Resource Strain: Not on file   Food Insecurity: Not on file   Transportation Needs: Not on file   Physical Activity: Not on file   Stress: Not on file   Social Connections: Not on file   Intimate Partner Violence: Not on file   Housing Stability: Not on file       REVIEW OF SYSTEMS:      No changes from previous review of systems unless noted.    PHYSICAL EXAMINATION:  Resp 20   Ht 5\' 4"  (1.626 m)   BMI 47.65 kg/m   Body mass index is 47.65 kg/m.  GENERAL: Alert and oriented x3, in no acute distress.  HEENT: Normocephalic, atraumatic.    Right elbow incision is healing well.  Full elbow range of motion.    IMAGING:  Imaging read by myself and interpreted as follows:      ASSESSMENT & PLAN  Diagnosis: 1 week postop status post right lateral epicondylitis debridement    [name] [name] is doing well.  She will wear her wrist brace for 1 month.  She will follow-up in a month.  She is planning to return to work on April 12.    Prescription medication management discussed with patient.     Plan was discussed in detail with patient, all questions were  answered, and patient voiced understanding of plan.    Electronically signed by: April 14, MD     Note: This note was completed using voice recognition software.  Any typographical/name errors or mistakes are unintentional.

## 2021-06-30 ENCOUNTER — Ambulatory Visit: Payer: BC Managed Care – PPO | Admitting: Family Medicine

## 2021-06-30 ENCOUNTER — Encounter: Payer: Self-pay | Admitting: Family Medicine

## 2021-06-30 VITALS — BP 112/66 | HR 90 | Temp 99.3°F | Ht 60.0 in | Wt 156.8 lb

## 2021-06-30 DIAGNOSIS — J020 Streptococcal pharyngitis: Secondary | ICD-10-CM | POA: Diagnosis not present

## 2021-06-30 LAB — POCT RAPID STREP A (OFFICE): Rapid Strep A Screen: POSITIVE — AB

## 2021-06-30 MED ORDER — PENICILLIN V POTASSIUM 500 MG PO TABS: 500.0000 mg | ORAL_TABLET | Freq: Three times a day (TID) | ORAL | 0 refills | Status: AC

## 2021-06-30 NOTE — Progress Notes (Signed)
? ?Established Patient Office Visit ? ?Subjective:  ?Patient ID: Natalie Martinez, female    DOB: 01-Jun-1965  Age: 56 y.o. MRN: [id number] ? ?CC:  ?Chief Complaint  ?Patient presents with  ? Sore Throat  ?  Pt c/o sore throat, headache and pain in left ear started yesterday morning.   ? ? ?HPI ?[name] presents for evaluation and treatment of a 1 day history of sore throat, with headache, elevated temperature, nausea.  Denies stuffy nose drainage or cough.  Home COVID test was negative. ? ?Past Medical History:  ?Diagnosis Date  ? Allergy   ? Arthritis   ? Hypercholesterolemia   ? Neuromuscular disorder ([place])   ? sciatic pain   ? NSVD (normal spontaneous vaginal delivery)   ? X3  ? Vertigo   ? ? ?Past Surgical History:  ?Procedure Laterality Date  ? CERVICAL BIOPSY  W/ LOOP ELECTRODE EXCISION  2004  &  2005  ? X 2  ? [place] OF UTERUS  2000 & 2003  ? VAGINAL HYSTERECTOMY  2005  ? RECURRENT CERVICAL DYSPLASIA  ? ? ?Family History  ?Problem Relation Age of Onset  ? Diabetes Mother   ? Healthy Sister   ? Healthy Brother   ? Healthy Brother   ? Breast cancer Neg Hx   ? Colon cancer Neg Hx   ? Colon polyps Neg Hx   ? Esophageal cancer Neg Hx   ? Rectal cancer Neg Hx   ? Stomach cancer Neg Hx   ? ? ?Social History  ? ?Socioeconomic History  ? Marital status: Single  ?  Spouse name: Not on file  ? Number of children: 3  ? Years of education: 67  ? Highest education level: Not on file  ?Occupational History  ? Occupation: Glass blower/designer  ?Tobacco Use  ? Smoking status: Never  ? Smokeless tobacco: Never  ?Vaping Use  ? Vaping Use: Never used  ?Substance and Sexual Activity  ? Alcohol use: No  ?  Alcohol/week: 0.0 standard drinks  ? Drug use: No  ? Sexual activity: Not Currently  ?  Birth control/protection: None, Surgical  ?  Comment: 1st intercourse- 25, partners- 3,   ?Other Topics Concern  ? Not on file  ?Social History Narrative  ? Lives at home with her daughter and her mother.  ?  Occasional caffeine use (coffee twice weekly).  ? Right-handed.  ? ?Social Determinants of Health  ? ?Financial Resource Strain: Not on file  ?Food Insecurity: Not on file  ?Transportation Needs: Not on file  ?Physical Activity: Not on file  ?Stress: Not on file  ?Social Connections: Not on file  ?Intimate Partner Violence: Not on file  ? ? ?Outpatient Medications Prior to Visit  ?Medication Sig Dispense Refill  ? diclofenac (VOLTAREN) 50 MG EC tablet Take 50 mg by mouth 2 (two) times daily.    ? simvastatin (ZOCOR) 40 MG tablet Take 1 tablet (40 mg total) by mouth at bedtime. 90 tablet 3  ? fexofenadine (ALLEGRA) 180 MG tablet Take 1 tablet (180 mg total) by mouth daily. (Patient not taking: Reported on 06/30/2021) 30 tablet 0  ? fluticasone (FLONASE) 50 MCG/ACT nasal spray Place 2 sprays into both nostrils daily. (Patient not taking: Reported on 06/30/2021) 16 g 6  ? olopatadine (PATADAY) 0.1 % ophthalmic solution Place 1 drop into both eyes 2 (two) times daily as needed for allergies. (Patient not taking: Reported on 06/30/2021) 5 mL 0  ? UNABLE  TO FIND Med Name: medicine for arthritis    ? ?No facility-administered medications prior to visit.  ? ? ?Allergies  ?Allergen Reactions  ? Atorvastatin Nausea Only  ? ? ?ROS ?Review of Systems  ?Constitutional:  Negative for diaphoresis, fatigue, fever and unexpected weight change.  ?HENT:  Positive for sore throat. Negative for congestion, postnasal drip and voice change.   ?Eyes:  Negative for photophobia and visual disturbance.  ?Respiratory:  Negative for cough and wheezing.   ?Gastrointestinal:  Negative for abdominal pain, nausea and vomiting.  ?Genitourinary: Negative.   ?Musculoskeletal:  Negative for arthralgias and myalgias.  ?Skin:  Negative for rash.  ?Neurological:  Positive for headaches. Negative for light-headedness.  ? ?  ?Objective:  ?  ?Physical Exam ?Vitals and nursing note reviewed.  ?Constitutional:   ?   General: She is not in acute distress. ?    Appearance: She is well-developed. She is not ill-appearing, toxic-appearing or diaphoretic.  ?HENT:  ?   Right Ear: Tympanic membrane is not erythematous.  ?   Left Ear:  No middle ear effusion. Tympanic membrane is not erythematous.  ?   Mouth/Throat:  ?   Pharynx: Posterior oropharyngeal erythema present.  ?   Tonsils: No tonsillar exudate or tonsillar abscesses. 1+ on the right. 1+ on the left.  ?Eyes:  ?   Conjunctiva/sclera: Conjunctivae normal.  ?   Pupils: Pupils are equal, round, and reactive to light.  ?Cardiovascular:  ?   Rate and Rhythm: Normal rate and regular rhythm.  ?Pulmonary:  ?   Effort: Pulmonary effort is normal.  ?   Breath sounds: Normal breath sounds.  ?Musculoskeletal:  ?   Cervical back: Normal range of motion and neck supple.  ?Lymphadenopathy:  ?   Cervical: Cervical adenopathy present.  ?Neurological:  ?   Mental Status: She is alert and oriented to person, place, and time.  ?Psychiatric:     ?   Mood and Affect: Mood normal.     ?   Behavior: Behavior normal.  ? ? ?BP 112/66 (BP Location: Left Arm, Patient Position: Sitting, Cuff Size: Normal)   Pulse 90   Temp 99.3 ?F (37.4 ?C) (Temporal)   Ht 5' (1.524 m)   Wt 156 lb 12.8 oz (71.1 kg)   SpO2 94%   BMI 30.62 kg/m?  ?Wt Readings from Last 3 Encounters:  ?06/30/21 156 lb 12.8 oz (71.1 kg)  ?06/06/21 160 lb 12.8 oz (72.9 kg)  ?03/31/21 165 lb 3.2 oz (74.9 kg)  ? ? ? ?There are no preventive care reminders to display for this patient. ? ?There are no preventive care reminders to display for this patient. ? ?Lab Results  ?Component Value Date  ? TSH 1.70 06/21/2017  ? ?Lab Results  ?Component Value Date  ? WBC 4.2 02/05/2021  ? HGB 13.3 02/05/2021  ? HCT 40.1 02/05/2021  ? MCV 94.4 02/05/2021  ? PLT 251.0 02/05/2021  ? ?Lab Results  ?Component Value Date  ? NA 142 02/05/2021  ? K 4.9 02/05/2021  ? CO2 31 02/05/2021  ? GLUCOSE 88 02/05/2021  ? BUN 14 02/05/2021  ? CREATININE 0.64 02/05/2021  ? BILITOT 0.5 02/05/2021  ? ALKPHOS 90  02/05/2021  ? AST 19 02/05/2021  ? ALT 18 02/05/2021  ? PROT 7.2 02/05/2021  ? ALBUMIN 4.5 02/05/2021  ? CALCIUM 10.1 02/05/2021  ? GFR 101.16 02/05/2021  ? ?Lab Results  ?Component Value Date  ? CHOL 225 (H) 02/05/2021  ? ?Lab Results  ?  Component Value Date  ? HDL 49.50 02/05/2021  ? ?Lab Results  ?Component Value Date  ? LDLCALC 140 (H) 02/05/2021  ? ?Lab Results  ?Component Value Date  ? TRIG 177.0 (H) 02/05/2021  ? ?Lab Results  ?Component Value Date  ? CHOLHDL 5 02/05/2021  ? ?No results found for: HGBA1C ? ?  ?Assessment & Plan:  ? ?Problem List Items Addressed This Visit   ? ?  ? Respiratory  ? Strep throat - Primary  ? Relevant Medications  ? penicillin v potassium (VEETID) 500 MG tablet  ? ? ?Meds ordered this encounter  ?Medications  ? penicillin v potassium (VEETID) 500 MG tablet  ?  Sig: Take 1 tablet (500 mg total) by mouth 3 (three) times daily for 10 days.  ?  Dispense:  30 tablet  ?  Refill:  0  ? ? ?Follow-up: Return in about 5 days (around 07/05/2021), or if symptoms worsen or fail to improve.  ?Out of work through tomorrow.  Pen-Vee K 500 every 8 x10 days.  Advised patient to change out toothbrushes that have been close to hers immediately.  She is to use a new toothbrush after 2 days of antibiotic.  Information was given on strep throat in Spanish. ? ?Libby Maw, MD ?

## 2021-06-30 NOTE — Addendum Note (Signed)
Addended by: Augustina Mood on: 06/30/2021 04:47 PM ? ? Modules accepted: Orders ? ?

## 2021-07-01 NOTE — Telephone Encounter (Signed)
Patient called stating she is to return to work 4/12, after having surgery 3/27. She is having some pain and tenderness in her rt elbow still. She would like to know if this is normal to have some pain, or if she should wait to go back to work.     Please give pt a call back at [phone].

## 2021-07-02 NOTE — Telephone Encounter (Signed)
Patient called to follow up on the previous message entered. Patient was told Dr. Guido Sander is out of the office this week  on vacation. Patient is asking for a call back, and can be reached at [phone].

## 2021-07-04 NOTE — Telephone Encounter (Signed)
[employer] form completed, faxed and confirmation received. Copy scanned into patient's chart. Patient made aware via phone call and MyChart message.

## 2021-07-04 NOTE — Telephone Encounter (Signed)
Patient called back to check status of previous messages. She stated she is still hurting, she is also taking pain medication everyday and is wondering if her leave can be extended. She stated she also dropped off paperwork on 4/4 at [facility] when she was in for her post-op, it is a leave of absence form that is due asap.     Please advise with further instruction and when paperwork is complete: [phone]    She would like a hard copy for her records.

## 2021-07-22 LAB — HEMOGLOBIN A1C
Estimated Avg Glucose, External: 110 mg/dL (ref 91–123)
Hemoglobin A1C, External: 5.5 % (ref 4.8–5.6)

## 2021-07-28 ENCOUNTER — Ambulatory Visit: Payer: BC Managed Care – PPO | Admitting: Family Medicine

## 2021-07-29 ENCOUNTER — Ambulatory Visit
Admit: 2021-07-29 | Discharge: 2021-07-29 | Payer: PRIVATE HEALTH INSURANCE | Attending: Orthopaedic Surgery | Primary: Internal Medicine

## 2021-07-29 DIAGNOSIS — M7711 Lateral epicondylitis, right elbow: Secondary | ICD-10-CM

## 2021-07-29 NOTE — Progress Notes (Signed)
[name] AM[facility]ULAT[facility] OFFICE NOTE    Patient: [name] [name]                MRN: [id number]       SSN: [id number]  Date of Birth: 11-Oct-1965        AGE: 56 y.o.        SEX: female  There is no height or weight on file to calculate BMI.    PCP: Leta Jungling, MD  07/29/21    Chief Complaint: Right elbow follow-up    HPI: [name] [name] is a 56 y.o. female patient who is 6 weeks out from her right elbow lateral condylitis debridement and doing well.  No complaints of pain.    Past Medical History:   Diagnosis Date    Asthma     COVID-19 11/2018, 02/2020    H/O seasonal allergies     Hx of cholecystectomy 1999    Hypertension     Morbid obesity (HCC)        Family History   Problem Relation Age of Onset    Cancer Maternal Grandmother         colon    Diabetes Maternal Aunt     Hypertension Mother     Heart Disease Mother        Current Outpatient Medications   Medication Sig Dispense Refill    furosemide (LASIX) 20 MG tablet       amLODIPine (NORVASC) 10 MG tablet Take 10 mg by mouth daily      Multiple Vitamins-Minerals (THERAPEUTIC MULTIVITAMIN-MINERALS) tablet Take 1 tablet by mouth daily      albuterol sulfate HFA (PROVENTIL;VENTOLIN;PROAIR) 108 (90 Base) MCG/ACT inhaler Inhale 2 puffs into the lungs every 6 hours as needed      cetirizine (ZYRTEC) 10 MG tablet Take 10 mg by mouth daily      fluticasone (FLONASE) 50 MCG/ACT nasal spray 2 sprays by Nasal route daily      metFORMIN (GLUCOPHAGE) 500 MG tablet Take 500 mg by mouth daily (with breakfast)       No current facility-administered medications for this visit.       Allergies   Allergen Reactions    Latex Hives    Pollen Extract Cough and Other (See Comments)     Other reaction(s): Sneezing  Irritates asthma    Tomato Hives       Past Surgical History:   Procedure Laterality Date    APPENDECTOMY  2006    ARM SURGERY Right 06/16/2021    RIGHT ELBOW LATERAL EPICONDYLITIS DEBRIDEMENT; NERVE BLOCK performed by Jim Like, MD at [facility] MAIN OR    CESAREAN SECTION  1987, 1994, 1998    CHOLECYSTECTOMY  1998    COLONOSCOPY N/A 01/25/2020    COLONOSCOPY performed by Ihor Austin, MD at [facility] ENDOSCOPY    HYSTERECTOMY (CERVIX STATUS UNKNOWN)  2012    Partial    LUMBAR DISC SURGERY  2022       Social History     Socioeconomic History    Marital status: Married     Spouse name: Not on file    Number of children: Not on file    Years of education: Not on file    Highest education level: Not on file   Occupational History    Not on file   Tobacco Use    Smoking status: Never    Smokeless tobacco: Never   Vaping Use  Vaping Use: Never used   Substance and Sexual Activity    Alcohol use: Never     Alcohol/week: 1.0 standard drink    Drug use: Never    Sexual activity: Not on file   Other Topics Concern    Not on file   Social History Narrative    Not on file     Social Determinants of Health     Financial Resource Strain: Not on file   Food Insecurity: Not on file   Transportation Needs: Not on file   Physical Activity: Not on file   Stress: Not on file   Social Connections: Not on file   Intimate Partner Violence: Not on file   Housing Stability: Not on file       REVIEW OF SYSTEMS:      No changes from previous review of systems unless noted.    PHYSICAL EXAMINATION:  Temp 97.3 F (36.3 C) (Temporal)   There is no height or weight on file to calculate BMI.  GENERAL: Alert and oriented x3, in no acute distress.  HEENT: Normocephalic, atraumatic.    Right elbow incision is healing well.  Full elbow range of motion.  No pain with resisted wrist extension.    IMAGING:  Imaging read by myself and interpreted as follows:      ASSESSMENT & PLAN  Diagnosis: Right elbow lateral epicondylitis debridement postop 6 weeks    [name] [name] can wean out of the brace.  Home exercises were given.  Follow-up in 6 weeks.    Prescription medication management discussed with patient.     Plan was discussed in detail with patient, all questions were answered, and  patient voiced understanding of plan.    Electronically signed by: Jim Like, MD     Note: This note was completed using voice recognition software.  Any typographical/name errors or mistakes are unintentional.

## 2021-09-09 ENCOUNTER — Ambulatory Visit: Payer: PRIVATE HEALTH INSURANCE | Attending: Orthopaedic Surgery | Primary: Internal Medicine

## 2021-10-13 ENCOUNTER — Other Ambulatory Visit: Payer: Self-pay | Admitting: Obstetrics & Gynecology

## 2021-10-13 DIAGNOSIS — Z1231 Encounter for screening mammogram for malignant neoplasm of breast: Secondary | ICD-10-CM

## 2021-10-14 ENCOUNTER — Ambulatory Visit: Payer: PRIVATE HEALTH INSURANCE | Attending: Orthopaedic Surgery | Primary: Internal Medicine

## 2021-10-16 DIAGNOSIS — M458 Ankylosing spondylitis sacral and sacrococcygeal region: Secondary | ICD-10-CM | POA: Diagnosis not present

## 2021-10-16 DIAGNOSIS — M461 Sacroiliitis, not elsewhere classified: Secondary | ICD-10-CM | POA: Diagnosis not present

## 2021-10-22 ENCOUNTER — Ambulatory Visit (INDEPENDENT_AMBULATORY_CARE_PROVIDER_SITE_OTHER): Payer: BC Managed Care – PPO | Admitting: Obstetrics & Gynecology

## 2021-10-22 ENCOUNTER — Other Ambulatory Visit (HOSPITAL_COMMUNITY)
Admission: RE | Admit: 2021-10-22 | Discharge: 2021-10-22 | Disposition: A | Discharge status: Home / Self Care | Payer: BC Managed Care – PPO | Source: Ambulatory Visit | Attending: Obstetrics & Gynecology | Admitting: Obstetrics & Gynecology

## 2021-10-22 ENCOUNTER — Encounter: Payer: Self-pay | Admitting: Obstetrics & Gynecology

## 2021-10-22 VITALS — BP 110/64 | HR 70 | Ht 60.0 in | Wt 157.0 lb

## 2021-10-22 DIAGNOSIS — Z780 Asymptomatic menopausal state: Secondary | ICD-10-CM | POA: Diagnosis not present

## 2021-10-22 DIAGNOSIS — Z01419 Encounter for gynecological examination (general) (routine) without abnormal findings: Secondary | ICD-10-CM

## 2021-10-22 DIAGNOSIS — Z90710 Acquired absence of both cervix and uterus: Secondary | ICD-10-CM

## 2021-10-22 DIAGNOSIS — Z1272 Encounter for screening for malignant neoplasm of vagina: Secondary | ICD-10-CM

## 2021-10-22 DIAGNOSIS — N890 Mild vaginal dysplasia: Secondary | ICD-10-CM | POA: Diagnosis not present

## 2021-10-22 NOTE — Progress Notes (Signed)
Natalie Martinez [facility] [birth year] [id number]   History:    56 y.o. G3P3L3 Single.  Children 30, 27, 18.  Has 4 grand-children.     RP:  Established patient presenting for annual gyn exam   HPI:  S/P Total Hysterectomy for Dysplasia, no residual on patho.  No pelvic pain.  Normal vaginal secretions.  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  Pap 09/2020 Neg.  Pap reflex today.  Abstinent currently. Breasts wnl. Mammo Neg 10/2020.  BMI 30.66.  Health labs with Fam MD. Harriet Masson 2021, will repeat in 2024.    Past medical history,surgical history, family history and social history were all reviewed and documented in the EPIC chart.  Gynecologic History No LMP recorded. Patient has had a hysterectomy.  Obstetric History OB History  Gravida Para Term Preterm AB Living  '3 3 3     3  '$ SAB IAB Ectopic Multiple Live Births          3    # Outcome Date GA Lbr Len/2nd Weight Sex Delivery Anes PTL Lv  3 Term     F Vag-Spont  N LIV  2 Term     F Vag-Spont  N DEC  1 Term     M Vag-Spont  N LIV     ROS: A ROS was performed and pertinent positives and negatives are included in the history.  GENERAL: No fevers or chills. HEENT: No change in vision, no earache, sore throat or sinus congestion. NECK: No pain or stiffness. CARDIOVASCULAR: No chest pain or pressure. No palpitations. PULMONARY: No shortness of breath, cough or wheeze. GASTROINTESTINAL: No abdominal pain, nausea, vomiting or diarrhea, melena or bright red blood per rectum. GENITOURINARY: No urinary frequency, urgency, hesitancy or dysuria. MUSCULOSKELETAL: No joint or muscle pain, no back pain, no recent trauma. DERMATOLOGIC: No rash, no itching, no lesions. ENDOCRINE: No polyuria, polydipsia, no heat or cold intolerance. No recent change in weight. HEMATOLOGICAL: No anemia or easy bruising or bleeding. NEUROLOGIC: No headache, seizures, numbness, tingling or weakness. PSYCHIATRIC: No depression, no loss of interest in normal activity  or change in sleep pattern.     Exam:   BP 110/64   Pulse 70   Ht 5' (1.524 m)   Wt 157 lb (71.2 kg)   SpO2 99%   BMI 30.66 kg/m   Body mass index is 30.66 kg/m.  General appearance : Well developed well nourished female. No acute distress HEENT: Eyes: no retinal hemorrhage or exudates,  Neck supple, trachea midline, no carotid bruits, no thyroidmegaly Lungs: Clear to auscultation, no rhonchi or wheezes, or rib retractions  Heart: Regular rate and rhythm, no murmurs or gallops Breast:Examined in sitting and supine position were symmetrical in appearance, no palpable masses or tenderness,  no skin retraction, no nipple inversion, no nipple discharge, no skin discoloration, no axillary or supraclavicular lymphadenopathy Abdomen: no palpable masses or tenderness, no rebound or guarding Extremities: no edema or skin discoloration or tenderness  Pelvic: Vulva: Normal             Vagina: No gross lesions or discharge.  Pap reflex done.  Cervix/Uterus absent  Adnexa  Without masses or tenderness  Anus: Normal   Assessment/Plan:  56 y.o. female for annual exam   1. Encounter for Papanicolaou smear of vagina as part of routine gynecological examination Postmenopause, well on no HRT.  S/P Total Hysterectomy for Dysplasia, no residual on patho.  No pelvic pain.  Normal vaginal secretions.  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  Pap 09/2020 Neg.  Pap reflex today.  Abstinent currently. Breasts wnl. Mammo Neg 10/2020.  BMI 30.66.  Health labs with Fam MD. Harriet Masson 2021, will repeat in 2024. - Cytology - PAP( [facility])  2. History of total hysterectomy  3. VAIN I (vaginal intraepithelial neoplasia grade I) - Cytology - PAP( [place])  4. Postmenopause Postmenopause, well on no HRT.  S/P Total Hysterectomy.  Other orders - glipiZIDE (GLUCOTROL) 10 MG tablet; Take 10 mg by mouth daily before breakfast. - diclofenac (VOLTAREN) 75 MG EC tablet; Take 75 mg by mouth daily.    Princess Bruins MD, 3:36 PM 10/22/2021

## 2021-10-24 LAB — CYTOLOGY - PAP: Diagnosis: NEGATIVE

## 2021-11-12 ENCOUNTER — Ambulatory Visit
Admission: RE | Admit: 2021-11-12 | Discharge: 2021-11-12 | Disposition: A | Discharge status: Home / Self Care | Payer: BC Managed Care – PPO | Source: Ambulatory Visit | Attending: Obstetrics & Gynecology | Admitting: Obstetrics & Gynecology

## 2021-11-12 DIAGNOSIS — Z1231 Encounter for screening mammogram for malignant neoplasm of breast: Secondary | ICD-10-CM

## 2022-01-12 ENCOUNTER — Encounter: Payer: Self-pay | Admitting: Nurse Practitioner

## 2022-01-12 ENCOUNTER — Ambulatory Visit: Payer: BC Managed Care – PPO | Admitting: Nurse Practitioner

## 2022-01-12 VITALS — BP 98/72 | HR 80

## 2022-01-12 DIAGNOSIS — N898 Other specified noninflammatory disorders of vagina: Secondary | ICD-10-CM | POA: Diagnosis not present

## 2022-01-12 DIAGNOSIS — N760 Acute vaginitis: Secondary | ICD-10-CM | POA: Diagnosis not present

## 2022-01-12 LAB — WET PREP FOR TRICH, YEAST, CLUE

## 2022-01-12 MED ORDER — METRONIDAZOLE 0.75 % VA GEL: 1.0000 | Freq: Every day | VAGINAL | 0 refills | Status: AC

## 2022-01-12 NOTE — Progress Notes (Signed)
   Acute Office Visit  Subjective:    Patient ID: Natalie Martinez, female    DOB: October 20, 1965, 56 y.o.   MRN: [id number]   HPI 56 y.o. presents today for vulvovaginal irritation and itching x 3 days. Denies discharge, odor, or urinary symptoms.   Review of Systems  Constitutional: Negative.   Genitourinary:  Positive for vaginal pain (itching/irritation). Negative for dysuria, flank pain, frequency, genital sores, urgency and vaginal discharge.       Objective:    Physical Exam Constitutional:      Appearance: Normal appearance.  Genitourinary:    General: Normal vulva.     Vagina: Vaginal discharge present. No erythema.     BP 98/72   Pulse 80   SpO2 96%  Wt Readings from Last 3 Encounters:  10/22/21 157 lb (71.2 kg)  06/30/21 156 lb 12.8 oz (71.1 kg)  06/06/21 160 lb 12.8 oz (72.9 kg)        Patient informed chaperone available to be present for breast and/or pelvic exam. Patient has requested no chaperone to be present. Patient has been advised what will be completed during breast and pelvic exam.   Wet prep + clue cells (+ odor) , bacteria TNTC, mod WBCs  Assessment & Plan:   Problem List Items Addressed This Visit   None Visit Diagnoses     Bacterial vaginosis    -  Primary   Relevant Medications   metroNIDAZOLE (METROGEL) 0.75 % vaginal gel   Vaginal itching       Relevant Orders   WET PREP FOR [place], YEAST, CLUE (Completed)      Plan: Wet prep positive for clue cells - Metrogel 0.75% nightly x 5 nights.      Tamela Gammon DNP, 2:50 PM 01/12/2022

## 2022-02-20 DIAGNOSIS — H2513 Age-related nuclear cataract, bilateral: Secondary | ICD-10-CM | POA: Diagnosis not present

## 2022-02-20 DIAGNOSIS — H04123 Dry eye syndrome of bilateral lacrimal glands: Secondary | ICD-10-CM | POA: Diagnosis not present

## 2022-02-20 DIAGNOSIS — H524 Presbyopia: Secondary | ICD-10-CM | POA: Diagnosis not present

## 2022-02-20 DIAGNOSIS — H40013 Open angle with borderline findings, low risk, bilateral: Secondary | ICD-10-CM | POA: Diagnosis not present

## 2022-02-20 DIAGNOSIS — H35361 Drusen (degenerative) of macula, right eye: Secondary | ICD-10-CM | POA: Diagnosis not present

## 2022-02-22 ENCOUNTER — Other Ambulatory Visit: Payer: Self-pay | Admitting: Family Medicine

## 2022-02-22 DIAGNOSIS — E785 Hyperlipidemia, unspecified: Secondary | ICD-10-CM

## 2022-03-05 ENCOUNTER — Ambulatory Visit: Payer: BC Managed Care – PPO | Admitting: Family Medicine

## 2022-03-05 ENCOUNTER — Encounter: Payer: Self-pay | Admitting: Family Medicine

## 2022-03-05 VITALS — BP 115/70 | HR 78 | Temp 97.7°F | Ht 60.0 in | Wt 161.4 lb

## 2022-03-05 DIAGNOSIS — R1011 Right upper quadrant pain: Secondary | ICD-10-CM | POA: Insufficient documentation

## 2022-03-05 DIAGNOSIS — H1031 Unspecified acute conjunctivitis, right eye: Secondary | ICD-10-CM | POA: Diagnosis not present

## 2022-03-05 NOTE — Progress Notes (Signed)
Established Patient Office Visit   Subjective:  Patient ID: Natalie Martinez, female    DOB: 1965-08-15  Age: 56 y.o. MRN: [id number]  Chief Complaint  Patient presents with   Pain    Right side pain x 1 week no improvement.     HPI Encounter Diagnoses  Name Primary?   Right upper quadrant abdominal pain Yes   Acute conjunctivitis of right eye, unspecified acute conjunctivitis type    Status post 11-day history of right upper quadrant pain.  It is somehow better today.  She was actually having trouble sleeping on her right side because of the pain.  She was otherwise able to go about her daily routine.  She denied cough, indigestion, constipation, urinary frequency or dysuria, hematuria, nausea or vomiting.   Review of Systems  Constitutional: Negative.   HENT: Negative.    Eyes:  Negative for blurred vision, pain, discharge and redness.  Respiratory: Negative.  Negative for cough.   Cardiovascular: Negative.  Negative for chest pain and palpitations.  Gastrointestinal:  Positive for abdominal pain. Negative for blood in stool, constipation, diarrhea, heartburn, melena, nausea and vomiting.  Genitourinary: Negative.  Negative for dysuria, frequency, hematuria and urgency.  Musculoskeletal: Negative.  Negative for myalgias.  Skin:  Negative for rash.  Neurological:  Negative for tingling, loss of consciousness and weakness.  Endo/Heme/Allergies:  Negative for polydipsia.     Current Outpatient Medications:    simvastatin (ZOCOR) 40 MG tablet, TAKE 1 TABLET(40 MG) BY MOUTH AT BEDTIME, Disp: 90 tablet, Rfl: 3   Objective:     BP 115/70 (BP Location: Left Arm, Patient Position: Sitting, Cuff Size: Normal)   Pulse 78   Temp 97.7 F (36.5 C) (Temporal)   Ht 5' (1.524 m)   Wt 161 lb 6.4 oz (73.2 kg)   SpO2 96%   BMI 31.52 kg/m  Wt Readings from Last 3 Encounters:  03/05/22 161 lb 6.4 oz (73.2 kg)  10/22/21 157 lb (71.2 kg)  06/30/21 156 lb 12.8 oz (71.1 kg)       Physical Exam Constitutional:      General: She is not in acute distress.    Appearance: Normal appearance. She is not ill-appearing, toxic-appearing or diaphoretic.  HENT:     Head: Normocephalic and atraumatic.     Right Ear: External ear normal.     Left Ear: External ear normal.     Mouth/Throat:     Mouth: Mucous membranes are moist.     Pharynx: Oropharynx is clear. No oropharyngeal exudate or posterior oropharyngeal erythema.  Eyes:     General: No scleral icterus.       Right eye: No discharge.        Left eye: No discharge.     Extraocular Movements: Extraocular movements intact.     Conjunctiva/sclera: Conjunctivae normal.     Pupils: Pupils are equal, round, and reactive to light.      Comments: Patient says there is been no discharge from her eye, pain or difficulty with vision.  Cardiovascular:     Rate and Rhythm: Normal rate and regular rhythm.  Pulmonary:     Effort: Pulmonary effort is normal. No respiratory distress.     Breath sounds: Normal breath sounds. No wheezing or rales.  Abdominal:     General: Bowel sounds are normal. There is no distension.     Tenderness: There is no abdominal tenderness. There is no right CVA tenderness, left CVA tenderness, guarding or rebound.  Musculoskeletal:     Cervical back: No rigidity or tenderness.  Skin:    General: Skin is warm and dry.  Neurological:     Mental Status: She is alert and oriented to person, place, and time.  Psychiatric:        Mood and Affect: Mood normal.        Behavior: Behavior normal.      No results found for any visits on 03/05/22.    The 10-year ASCVD risk score (Arnett DK, et al., 2019) is: 1.9%    Assessment & Plan:   Right upper quadrant abdominal pain -     Basic metabolic panel -     CBC with Differential/Platelet -     Hepatic function panel -     Urinalysis, Routine w reflex microscopic  Acute conjunctivitis of right eye, unspecified acute conjunctivitis  type    Return if symptoms worsen or fail to improve.  As long as abdominal pain continues to improve is normal we can follow.  If it becomes worse please go to the emergency room.  Will consider an ultrasound.  She will let me know if the redness in her eye does not resolve.  Libby Maw, MD

## 2022-03-06 LAB — CBC WITH DIFFERENTIAL/PLATELET
Basophils Absolute: 0 10*3/uL (ref 0.0–0.1)
Basophils Relative: 0.4 % (ref 0.0–3.0)
Eosinophils Absolute: 0.3 10*3/uL (ref 0.0–0.7)
Eosinophils Relative: 4.3 % (ref 0.0–5.0)
HCT: 41.3 % (ref 36.0–46.0)
Hemoglobin: 13.7 g/dL (ref 12.0–15.0)
Lymphocytes Relative: 35.9 % (ref 12.0–46.0)
Lymphs Abs: 2.2 10*3/uL (ref 0.7–4.0)
MCHC: 33.1 g/dL (ref 30.0–36.0)
MCV: 95.4 fl (ref 78.0–100.0)
Monocytes Absolute: 0.3 10*3/uL (ref 0.1–1.0)
Monocytes Relative: 5.3 % (ref 3.0–12.0)
Neutro Abs: 3.3 10*3/uL (ref 1.4–7.7)
Neutrophils Relative %: 54.1 % (ref 43.0–77.0)
Platelets: 265 10*3/uL (ref 150.0–400.0)
RBC: 4.33 Mil/uL (ref 3.87–5.11)
RDW: 13.1 % (ref 11.5–15.5)
WBC: 6.2 10*3/uL (ref 4.0–10.5)

## 2022-03-06 LAB — URINALYSIS, ROUTINE W REFLEX MICROSCOPIC
Bilirubin Urine: NEGATIVE
Ketones, ur: NEGATIVE
Nitrite: NEGATIVE
Specific Gravity, Urine: >=1.03 — AB (ref 1.000–1.030)
Total Protein, Urine: NEGATIVE
Urine Glucose: NEGATIVE
Urobilinogen, UA: 0.2 (ref 0.0–1.0)
pH: 5.5 (ref 5.0–8.0)

## 2022-03-06 LAB — BASIC METABOLIC PANEL
BUN: 19 mg/dL (ref 6–23)
CO2: 31 mEq/L (ref 19–32)
Calcium: 9.9 mg/dL (ref 8.4–10.5)
Chloride: 104 mEq/L (ref 96–112)
Creatinine, Ser: 0.57 mg/dL (ref 0.40–1.20)
GFR: 103.23 mL/min (ref >60.00)
Glucose, Bld: 98 mg/dL (ref 70–99)
Potassium: 4.7 mEq/L (ref 3.5–5.1)
Sodium: 144 mEq/L (ref 135–145)

## 2022-03-06 LAB — HEPATIC FUNCTION PANEL
ALT: 15 U/L (ref 0–35)
AST: 19 U/L (ref 0–37)
Albumin: 4.5 g/dL (ref 3.5–5.2)
Alkaline Phosphatase: 90 U/L (ref 39–117)
Bilirubin, Direct: 0.1 mg/dL (ref 0.0–0.3)
Total Bilirubin: 0.4 mg/dL (ref 0.2–1.2)
Total Protein: 7.1 g/dL (ref 6.0–8.3)

## 2022-04-02 DIAGNOSIS — Z0289 Encounter for other administrative examinations: Secondary | ICD-10-CM | POA: Diagnosis not present

## 2022-04-06 DIAGNOSIS — Z23 Encounter for immunization: Secondary | ICD-10-CM | POA: Diagnosis not present

## 2022-04-17 ENCOUNTER — Ambulatory Visit: Payer: BC Managed Care – PPO | Admitting: Family Medicine

## 2022-04-17 ENCOUNTER — Ambulatory Visit (INDEPENDENT_AMBULATORY_CARE_PROVIDER_SITE_OTHER)
Admission: RE | Admit: 2022-04-17 | Discharge: 2022-04-17 | Disposition: A | Discharge status: Home / Self Care | Payer: BC Managed Care – PPO | Source: Ambulatory Visit | Attending: Family Medicine | Admitting: Family Medicine

## 2022-04-17 ENCOUNTER — Encounter: Payer: Self-pay | Admitting: Family Medicine

## 2022-04-17 VITALS — BP 108/68 | HR 79 | Temp 97.1°F | Ht 60.0 in | Wt 161.0 lb

## 2022-04-17 DIAGNOSIS — S63501A Unspecified sprain of right wrist, initial encounter: Secondary | ICD-10-CM

## 2022-04-17 DIAGNOSIS — M89319 Hypertrophy of bone, unspecified shoulder: Secondary | ICD-10-CM

## 2022-04-17 DIAGNOSIS — M958 Other specified acquired deformities of musculoskeletal system: Secondary | ICD-10-CM | POA: Diagnosis not present

## 2022-04-17 DIAGNOSIS — M89312 Hypertrophy of bone, left shoulder: Secondary | ICD-10-CM

## 2022-04-17 DIAGNOSIS — K635 Polyp of colon: Secondary | ICD-10-CM

## 2022-04-17 DIAGNOSIS — M25531 Pain in right wrist: Secondary | ICD-10-CM | POA: Diagnosis not present

## 2022-04-17 DIAGNOSIS — E785 Hyperlipidemia, unspecified: Secondary | ICD-10-CM

## 2022-04-17 MED ORDER — WRIST SPLINT/COCK-UP/RIGHT SM MISC: 0 refills | Status: DC

## 2022-04-17 NOTE — Progress Notes (Signed)
Established Patient Office Visit   Subjective:  Patient ID: Natalie Martinez, female    DOB: [birth year]  Age: 57 y.o. MRN: [id number]  Chief Complaint  Patient presents with   Wrist Pain    Right wrist pain x 3 weeks. Check knot on neck noticed 4 weeks ago.     Wrist Pain  Pertinent negatives include no tingling.   Encounter Diagnoses  Name Primary?   Sprain of right wrist, initial encounter Yes   Clavicle enlargement    Dyslipidemia with elevated low density lipoprotein (LDL) cholesterol and abnormally low high density lipoprotein cholesterol    Sessile colonic polyp    Has been experiencing right wrist pain over the last few weeks.  Right-hand-dominant.  She is a Glass blower/designer.  No recent injury history.  Concerned about an asymmetry in her left clavicle.  There was no injury history to the clavicle.  Continues with simvastatin for elevated cholesterol.  She is on a 3-year callback schedule for colonoscopies.   Review of Systems  Constitutional: Negative.   HENT: Negative.    Eyes:  Negative for blurred vision, discharge and redness.  Respiratory: Negative.    Cardiovascular: Negative.   Gastrointestinal:  Negative for abdominal pain, blood in stool and melena.  Genitourinary: Negative.   Musculoskeletal:  Positive for joint pain. Negative for myalgias.  Skin:  Negative for rash.  Neurological:  Negative for tingling, loss of consciousness and weakness.  Endo/Heme/Allergies:  Negative for polydipsia.     Current Outpatient Medications:    diclofenac (VOLTAREN) 75 MG EC tablet, Take 75 mg by mouth daily., Disp: , Rfl:    Elastic Bandages & Supports (WRIST SPLINT/COCK-UP/RIGHT SM) MISC, Wear splint continuously over the next 4 weeks., Disp: 1 each, Rfl: 0   simvastatin (ZOCOR) 40 MG tablet, TAKE 1 TABLET(40 MG) BY MOUTH AT BEDTIME, Disp: 90 tablet, Rfl: 3   Objective:     BP 108/68   Pulse 79   SpO2 95%    Physical Exam Constitutional:      General:  She is not in acute distress.    Appearance: Normal appearance. She is not ill-appearing, toxic-appearing or diaphoretic.  HENT:     Head: Normocephalic and atraumatic.     Right Ear: External ear normal.     Left Ear: External ear normal.  Eyes:     General: No scleral icterus.       Right eye: No discharge.        Left eye: No discharge.     Extraocular Movements: Extraocular movements intact.     Conjunctiva/sclera: Conjunctivae normal.  Pulmonary:     Effort: Pulmonary effort is normal. No respiratory distress.  Musculoskeletal:     Right wrist: Tenderness present. No swelling or bony tenderness. Normal range of motion.       Arms:  Skin:    General: Skin is warm and dry.  Neurological:     Mental Status: She is alert and oriented to person, place, and time.  Psychiatric:        Mood and Affect: Mood normal.        Behavior: Behavior normal.      No results found for any visits on 04/17/22.    The 10-year ASCVD risk score (Arnett DK, et al., 2019) is: 1.6%    Assessment & Plan:   Sprain of right wrist, initial encounter -     DG Wrist Complete Right; Future -     Wrist Splint/Cock-Up/Right Sm;  Wear splint continuously over the next 4 weeks.  Dispense: 1 each; Refill: 0  Clavicle enlargement -     DG Clavicle Left; Future  Dyslipidemia with elevated low density lipoprotein (LDL) cholesterol and abnormally low high density lipoprotein cholesterol -     CBC; Future -     Comprehensive metabolic panel; Future -     Lipid panel; Future  Sessile colonic polyp -     Ambulatory referral to Gastroenterology    Return Return fasting for above ordered blood work.  Let me know if wrist pain is not better in a few weeks.    Libby Maw, MD

## 2022-04-22 ENCOUNTER — Encounter: Payer: Self-pay | Admitting: Gastroenterology

## 2022-04-30 ENCOUNTER — Telehealth: Payer: Self-pay | Admitting: Family Medicine

## 2022-04-30 DIAGNOSIS — M25531 Pain in right wrist: Secondary | ICD-10-CM | POA: Diagnosis not present

## 2022-04-30 DIAGNOSIS — M461 Sacroiliitis, not elsewhere classified: Secondary | ICD-10-CM | POA: Diagnosis not present

## 2022-04-30 DIAGNOSIS — M458 Ankylosing spondylitis sacral and sacrococcygeal region: Secondary | ICD-10-CM | POA: Diagnosis not present

## 2022-04-30 NOTE — Telephone Encounter (Signed)
Patient dropped off document to be filled out by provider  [employer] screening form . Patient requested to send it via Call Patient to pick up within 7-days. Document is located in providers tray at front office.

## 2022-05-01 NOTE — Telephone Encounter (Signed)
Form received patient have not had required labs drawn. Appointment scheduled for ordered labs to be drawn so that form can be completed.

## 2022-05-04 ENCOUNTER — Other Ambulatory Visit: Payer: BC Managed Care – PPO

## 2022-05-04 ENCOUNTER — Other Ambulatory Visit (INDEPENDENT_AMBULATORY_CARE_PROVIDER_SITE_OTHER): Payer: BC Managed Care – PPO

## 2022-05-04 DIAGNOSIS — E785 Hyperlipidemia, unspecified: Secondary | ICD-10-CM

## 2022-05-04 LAB — CBC
HCT: 40.9 % (ref 36.0–46.0)
Hemoglobin: 13.7 g/dL (ref 12.0–15.0)
MCHC: 33.6 g/dL (ref 30.0–36.0)
MCV: 94.7 fl (ref 78.0–100.0)
Platelets: 246 10*3/uL (ref 150.0–400.0)
RBC: 4.32 Mil/uL (ref 3.87–5.11)
RDW: 13.1 % (ref 11.5–15.5)
WBC: 4.7 10*3/uL (ref 4.0–10.5)

## 2022-05-04 LAB — COMPREHENSIVE METABOLIC PANEL
ALT: 15 U/L (ref 0–35)
AST: 18 U/L (ref 0–37)
Albumin: 4.4 g/dL (ref 3.5–5.2)
Alkaline Phosphatase: 90 U/L (ref 39–117)
BUN: 17 mg/dL (ref 6–23)
CO2: 31 mEq/L (ref 19–32)
Calcium: 10.1 mg/dL (ref 8.4–10.5)
Chloride: 103 mEq/L (ref 96–112)
Creatinine, Ser: 0.62 mg/dL (ref 0.40–1.20)
GFR: 101.05 mL/min (ref >60.00)
Glucose, Bld: 93 mg/dL (ref 70–99)
Potassium: 4.9 mEq/L (ref 3.5–5.1)
Sodium: 141 mEq/L (ref 135–145)
Total Bilirubin: 1 mg/dL (ref 0.2–1.2)
Total Protein: 6.9 g/dL (ref 6.0–8.3)

## 2022-05-04 LAB — LIPID PANEL
Cholesterol: 325 mg/dL — ABNORMAL HIGH (ref 0–200)
HDL: 47.8 mg/dL (ref >39.00)
LDL Cholesterol: 247 mg/dL — ABNORMAL HIGH (ref 0–99)
NonHDL: 277.19
Total CHOL/HDL Ratio: 7
Triglycerides: 149 mg/dL (ref 0.0–149.0)
VLDL: 29.8 mg/dL (ref 0.0–40.0)

## 2022-05-06 DIAGNOSIS — S63591A Other specified sprain of right wrist, initial encounter: Secondary | ICD-10-CM | POA: Diagnosis not present

## 2022-05-11 NOTE — Telephone Encounter (Signed)
Pt is checking on the form. She needs to give it to her insurance.

## 2022-05-19 ENCOUNTER — Telehealth: Payer: Self-pay | Admitting: Family Medicine

## 2022-05-19 NOTE — Telephone Encounter (Signed)
Patient verbally understood form faxed. Patient would like copy to drop off as well. Placed up front for pick up.

## 2022-05-19 NOTE — Telephone Encounter (Signed)
Pt is inquiring about a form she dropped off to be completed.

## 2022-05-26 ENCOUNTER — Ambulatory Visit (AMBULATORY_SURGERY_CENTER): Payer: BC Managed Care – PPO | Admitting: *Deleted

## 2022-05-26 VITALS — Ht 60.0 in | Wt 161.0 lb

## 2022-05-26 DIAGNOSIS — R42 Dizziness and giddiness: Secondary | ICD-10-CM | POA: Insufficient documentation

## 2022-05-26 DIAGNOSIS — Z86010 Personal history of colonic polyps: Secondary | ICD-10-CM

## 2022-05-26 MED ORDER — NA SULFATE-K SULFATE-MG SULF 17.5-3.13-1.6 GM/177ML PO SOLN: 1.0000 | Freq: Once | ORAL | 0 refills | Status: AC

## 2022-05-26 NOTE — Progress Notes (Signed)
Patient's chart reviewed by Osvaldo Angst CNRA prior to previsit and patient appropriate for the [place].   No egg or soy allergy known to patient  No issues known to pt with past sedation with any surgeries or procedures Patient denies ever being told they had issues or difficulty with intubation  No issues moving head or neck No issues with swallowing No FH of Malignant Hyperthermia Pt denies any upcoming cardiac testing Pt is not on diet pills Pt is not on  home 02  Pt is not on blood thinners  Pt is not on dialysis Pt weighed in office 161 lb Pt encouraged to use to use Singlecare or Goodrx to reduce cost Instructions reviewed with pt and pt states understanding. Instructed to review again prior to procedure. Pt states they will.  Instructions sent by mail with verified by pt address and with coupon if applicable.  Instructions also sent by my chart  Previsit completed and red dot placed by patient's name on their procedure day (on provider's schedule).   Visit was in person

## 2022-05-27 ENCOUNTER — Encounter: Payer: Self-pay | Admitting: Gastroenterology

## 2022-06-25 ENCOUNTER — Encounter: Payer: Self-pay | Admitting: Certified Registered Nurse Anesthetist

## 2022-06-26 ENCOUNTER — Encounter: Payer: Self-pay | Admitting: Gastroenterology

## 2022-06-26 ENCOUNTER — Ambulatory Visit (AMBULATORY_SURGERY_CENTER): Payer: BC Managed Care – PPO | Admitting: Gastroenterology

## 2022-06-26 VITALS — BP 114/74 | HR 69 | Temp 97.8°F | Resp 15 | Ht 60.0 in | Wt 161.0 lb

## 2022-06-26 DIAGNOSIS — Z09 Encounter for follow-up examination after completed treatment for conditions other than malignant neoplasm: Secondary | ICD-10-CM | POA: Diagnosis not present

## 2022-06-26 DIAGNOSIS — Z1211 Encounter for screening for malignant neoplasm of colon: Secondary | ICD-10-CM | POA: Diagnosis not present

## 2022-06-26 DIAGNOSIS — D128 Benign neoplasm of rectum: Secondary | ICD-10-CM | POA: Diagnosis not present

## 2022-06-26 DIAGNOSIS — D124 Benign neoplasm of descending colon: Secondary | ICD-10-CM

## 2022-06-26 DIAGNOSIS — K640 First degree hemorrhoids: Secondary | ICD-10-CM

## 2022-06-26 DIAGNOSIS — Z86010 Personal history of colonic polyps: Secondary | ICD-10-CM

## 2022-06-26 DIAGNOSIS — K621 Rectal polyp: Secondary | ICD-10-CM | POA: Diagnosis not present

## 2022-06-26 MED ORDER — SODIUM CHLORIDE 0.9 % IV SOLN: 500.0000 mL | Freq: Once | INTRAVENOUS | Status: DC

## 2022-06-26 NOTE — Progress Notes (Signed)
Called to room to assist during endoscopic procedure.  Patient ID and intended procedure confirmed with present staff. Received instructions for my participation in the procedure from the performing physician.  

## 2022-06-26 NOTE — Progress Notes (Signed)
Pt's states no medical or surgical changes since previsit or office visit. VS assessed by E.C 

## 2022-06-26 NOTE — Progress Notes (Signed)
GASTROENTEROLOGY PROCEDURE H&P NOTE   Primary Care Physician: Mliss Sax, MD    Reason for Procedure:  Colon Cancer screening and colon polyp surveillance  Plan:    Colonoscopy  Patient is appropriate for endoscopic procedure(s) in the ambulatory ([facility]) setting.  The nature of the procedure, as well as the risks, benefits, and alternatives were carefully and thoroughly reviewed with the patient. Ample time for discussion and questions allowed. The patient understood, was satisfied, and agreed to proceed.     HPI: Natalie Martinez is a 57 y.o. female who presents for colonoscopy for continued colon polyp surveillance and Colon Cancer screening.  No active GI symptoms.    Last colonoscopy was 03/2019 and notable for 3 subcentimeter adenomas in the sigmoid/descending colon, single diverticulum in the transverse colon, and internal hemorrhoids.  Recommended repeat in 3 years.    Past Medical History:  Diagnosis Date   Allergy    Arthritis    Hypercholesterolemia    Neuromuscular disorder    sciatic pain    NSVD (normal spontaneous vaginal delivery)    X3   Vertigo     Past Surgical History:  Procedure Laterality Date   CERVICAL BIOPSY  W/ LOOP ELECTRODE EXCISION  2004  &  2005   X 2   DILATION AND CURETTAGE OF UTERUS  2000 & 2003   VAGINAL HYSTERECTOMY  2005   RECURRENT CERVICAL DYSPLASIA    Prior to Admission medications   Medication Sig Start Date End Date Taking? Authorizing Provider  Cholecalciferol (VITAMIN D3) 125 MCG (5000 UT) capsule Take 5,000 Units by mouth daily.    [provider]  Cyanocobalamin (VITAMIN B 12 PO) Take 1,000 mg by mouth daily at 6 (six) AM.    [provider]  diclofenac (VOLTAREN) 75 MG EC tablet Take 75 mg by mouth daily. 04/13/22   [provider]  Elastic Bandages & Supports (WRIST SPLINT/COCK-UP/RIGHT SM) MISC Wear splint continuously over the next 4 weeks. Patient not taking: Reported on  05/26/2022 04/17/22   Mliss Sax, MD  simvastatin (ZOCOR) 40 MG tablet TAKE 1 TABLET(40 MG) BY MOUTH AT BEDTIME 02/23/22   Mliss Sax, MD    Current Outpatient Medications  Medication Sig Dispense Refill   Cholecalciferol (VITAMIN D3) 125 MCG (5000 UT) capsule Take 5,000 Units by mouth daily.     Cyanocobalamin (VITAMIN B 12 PO) Take 1,000 mg by mouth daily at 6 (six) AM.     diclofenac (VOLTAREN) 75 MG EC tablet Take 75 mg by mouth daily.     Elastic Bandages & Supports (WRIST SPLINT/COCK-UP/RIGHT SM) MISC Wear splint continuously over the next 4 weeks. (Patient not taking: Reported on 05/26/2022) 1 each 0   simvastatin (ZOCOR) 40 MG tablet TAKE 1 TABLET(40 MG) BY MOUTH AT BEDTIME 90 tablet 3   Current Facility-Administered Medications  Medication Dose Route Frequency Provider Last Rate Last Admin   0.9 %  sodium chloride infusion  500 mL Intravenous Once Jaesean Litzau V, DO        Allergies as of 06/26/2022 - Review Complete 06/26/2022  Allergen Reaction Noted   Atorvastatin Nausea Only 01/28/2021    Family History  Problem Relation Age of Onset   Diabetes Mother    Healthy Sister    Healthy Brother    Healthy Brother    Breast cancer Neg Hx    Colon cancer Neg Hx    Colon polyps Neg Hx    Esophageal cancer Neg Hx  Rectal cancer Neg Hx    Stomach cancer Neg Hx     Social History   Socioeconomic History   Marital status: Single    Spouse name: Not on file   Number of children: 3   Years of education: 15   Highest education level: Not on file  Occupational History   Occupation: Location managerMachine Operator  Tobacco Use   Smoking status: Never   Smokeless tobacco: Never  Vaping Use   Vaping Use: Never used  Substance and Sexual Activity   Alcohol use: No    Alcohol/week: 0.0 standard drinks of alcohol   Drug use: No   Sexual activity: Not Currently    Birth control/protection: Surgical    Comment: 1st intercourse- 25, partners- 3, hysterectomy   Other Topics Concern   Not on file  Social History Narrative   Lives at home with her daughter and her mother.   Occasional caffeine use (coffee twice weekly).   Right-handed.   Social Determinants of Health   Financial Resource Strain: Not on file  Food Insecurity: Not on file  Transportation Needs: Not on file  Physical Activity: Not on file  Stress: Not on file  Social Connections: Not on file  Intimate Partner Violence: Not on file    Physical Exam: Vital signs in last 24 hours: @BP  111/65   Pulse 85   Temp 97.8 F (36.6 C) (Skin)   Ht 5' (1.524 m)   Wt 161 lb (73 kg)   SpO2 96%   BMI 31.44 kg/m  GEN: NAD EYE: Sclerae anicteric ENT: MMM CV: Non-tachycardic Pulm: CTA b/l GI: Soft, NT/ND NEURO:  Alert & Oriented x 3   Natalie LocksVito Danicia Terhaar, DO [facility]   06/26/2022 8:50 AM

## 2022-06-26 NOTE — Op Note (Signed)
[facility] Patient Name: Natalie KocherBrenda Martinez Procedure Date: 06/26/2022 8:51 AM MRN: [id number] Endoscopist: Doristine LocksVito Darren Caldron , MD, [phone] Age: [age] Referring MD:  Date of Birth: October 15, 1965 Gender: Female Account #: [id number] Procedure:                Colonoscopy Indications:              Surveillance: Personal history of adenomatous                            polyps on last colonoscopy 3 years ago                           Last colonoscopy was 03/2019 and notable for 3                            subcentimeter adenomas in the sigmoid/descending                            colon, single diverticulum in the transverse colon,                            and internal hemorrhoids. Recommended repeat in 3                            years. Medicines:                Monitored Anesthesia Care Procedure:                Pre-Anesthesia Assessment:                           - Prior to the procedure, a History and Physical                            was performed, and patient medications and                            allergies were reviewed. The patient's tolerance of                            previous anesthesia was also reviewed. The risks                            and benefits of the procedure and the sedation                            options and risks were discussed with the patient.                            All questions were answered, and informed consent                            was obtained. Prior Anticoagulants: The patient has  taken no anticoagulant or antiplatelet agents. ASA                            Grade Assessment: II - A patient with mild systemic                            disease. After reviewing the risks and benefits,                            the patient was deemed in satisfactory condition to                            undergo the procedure.                           After obtaining informed consent, the colonoscope                             was passed under direct vision. Throughout the                            procedure, the patient's blood pressure, pulse, and                            oxygen saturations were monitored continuously. The                            CF [id number] #[id number] was introduced through the anus                            and advanced to the the cecum, identified by                            appendiceal orifice and ileocecal valve. The                            colonoscopy was performed without difficulty. The                            patient tolerated the procedure well. The quality                            of the bowel preparation was good. The ileocecal                            valve, appendiceal orifice, and rectum were                            photographed. Scope In: 8:57:37 AM Scope Out: 9:14:53 AM Scope Withdrawal Time: 0 hours 13 minutes 34 seconds  Total Procedure Duration: 0 hours 17 minutes 16 seconds  Findings:                 The perianal and digital rectal examinations were  normal.                           A 3 mm polyp was found in the descending colon. The                            polyp was sessile. The polyp was removed with a                            cold snare. Resection and retrieval were complete.                            Estimated blood loss was minimal.                           Four sessile polyps were found in the rectum. The                            polyps were 1 to 3 mm in size. These polyps were                            removed with a cold snare. Resection and retrieval                            were complete. Estimated blood loss was minimal.                           Non-bleeding internal hemorrhoids were found during                            retroflexion. The hemorrhoids were small. Complications:            No immediate complications. Estimated Blood Loss:     Estimated blood loss was minimal. Impression:                - One 3 mm polyp in the descending colon, removed                            with a cold snare. Resected and retrieved.                           - Four 1 to 3 mm polyps in the rectum, removed with                            a cold snare. Resected and retrieved.                           - Non-bleeding internal hemorrhoids.                           - The GI Genius (intelligent endoscopy module),                            computer-aided polyp detection system powered by  AI                            was utilized to detect colorectal polyps through                            enhanced visualization during colonoscopy. Recommendation:           - Patient has a contact number available for                            emergencies. The signs and symptoms of potential                            delayed complications were discussed with the                            patient. Return to normal activities tomorrow.                            Written discharge instructions were provided to the                            patient.                           - Resume previous diet.                           - Continue present medications.                           - Await pathology results.                           - Repeat colonoscopy for surveillance based on                            pathology results.                           - Return to GI clinic PRN. Doristine Locks, MD 06/26/2022 9:20:18 AM

## 2022-06-26 NOTE — Patient Instructions (Signed)
   Handouts on polyps & hemorrhoids given to you today  Await pathology results on polyps removed    YOU HAD AN ENDOSCOPIC PROCEDURE TODAY AT THE [facility]:   Refer to the procedure report that was given to you for any specific questions about what was found during the examination.  If the procedure report does not answer your questions, please call your gastroenterologist to clarify.  If you requested that your care partner not be given the details of your procedure findings, then the procedure report has been included in a sealed envelope for you to review at your convenience later.  YOU SHOULD EXPECT: Some feelings of bloating in the abdomen. Passage of more gas than usual.  Walking can help get rid of the air that was put into your GI tract during the procedure and reduce the bloating. If you had a lower endoscopy (such as a colonoscopy or flexible sigmoidoscopy) you may notice spotting of blood in your stool or on the toilet paper. If you underwent a bowel prep for your procedure, you may not have a normal bowel movement for a few days.  Please Note:  You might notice some irritation and congestion in your nose or some drainage.  This is from the oxygen used during your procedure.  There is no need for concern and it should clear up in a day or so.  SYMPTOMS TO REPORT IMMEDIATELY:  Following lower endoscopy (colonoscopy or flexible sigmoidoscopy):  Excessive amounts of blood in the stool  Significant tenderness or worsening of abdominal pains  Swelling of the abdomen that is new, acute  Fever of 100F or higher   For urgent or emergent issues, a gastroenterologist can be reached at any hour by calling [phone]. Do not use MyChart messaging for urgent concerns.    DIET:  We do recommend a small meal at first, but then you may proceed to your regular diet.  Drink plenty of fluids but you should avoid alcoholic beverages for 24 hours.  ACTIVITY:  You should plan to  take it easy for the rest of today and you should NOT DRIVE or use heavy machinery until tomorrow (because of the sedation medicines used during the test).    FOLLOW UP: Our staff will call the number listed on your records the next business day following your procedure.  We will call around 7:15- 8:00 am to check on you and address any questions or concerns that you may have regarding the information given to you following your procedure. If we do not reach you, we will leave a message.     If any biopsies were taken you will be contacted by phone or by letter within the next 1-3 weeks.  Please call us at [phone] if you have not heard about the biopsies in 3 weeks.    SIGNATURES/CONFIDENTIALITY: You and/or your care partner have signed paperwork which will be entered into your electronic medical record.  These signatures attest to the fact that that the information above on your After Visit Summary has been reviewed and is understood.  Full responsibility of the confidentiality of this discharge information lies with you and/or your care-partner. 

## 2022-06-26 NOTE — Progress Notes (Signed)
Report given to PACU, vss 

## 2022-06-29 ENCOUNTER — Telehealth: Payer: Self-pay | Admitting: *Deleted

## 2022-06-29 NOTE — Telephone Encounter (Signed)
Left message on f/u call 

## 2022-07-02 ENCOUNTER — Encounter: Payer: Self-pay | Admitting: Gastroenterology

## 2022-07-30 ENCOUNTER — Telehealth: Payer: BC Managed Care – PPO | Admitting: Physician Assistant

## 2022-07-30 DIAGNOSIS — J208 Acute bronchitis due to other specified organisms: Secondary | ICD-10-CM | POA: Diagnosis not present

## 2022-07-30 DIAGNOSIS — B9689 Other specified bacterial agents as the cause of diseases classified elsewhere: Secondary | ICD-10-CM

## 2022-07-30 MED ORDER — AZITHROMYCIN 250 MG PO TABS: ORAL_TABLET | ORAL | 0 refills | Status: AC

## 2022-07-30 MED ORDER — BENZONATATE 100 MG PO CAPS: 100.0000 mg | ORAL_CAPSULE | Freq: Three times a day (TID) | ORAL | 0 refills | Status: DC | PRN

## 2022-07-30 NOTE — Patient Instructions (Signed)
Estill Cotta, thank you for joining Piedad Climes, PA-C for today's virtual visit.  While this provider is not your primary care provider (PCP), if your PCP is located in our provider database this encounter information will be shared with them immediately following your visit.   A [facility] MyChart account gives you access to today's visit and all your visits, tests, and labs performed at [facility] " click here if you don't have a [facility] MyChart account or go to [email or web address]  Consent: (Patient) Ameria Postlethwait provided verbal consent for this virtual visit at the beginning of the encounter.  Current Medications:  Current Outpatient Medications:    Cholecalciferol (VITAMIN D3) 125 MCG (5000 UT) capsule, Take 5,000 Units by mouth daily., Disp: , Rfl:    Cyanocobalamin (VITAMIN B 12 PO), Take 1,000 mg by mouth daily at 6 (six) AM., Disp: , Rfl:    diclofenac (VOLTAREN) 75 MG EC tablet, Take 75 mg by mouth daily., Disp: , Rfl:    Elastic Bandages & Supports (WRIST SPLINT/COCK-UP/RIGHT SM) MISC, Wear splint continuously over the next 4 weeks. (Patient not taking: Reported on 05/26/2022), Disp: 1 each, Rfl: 0   simvastatin (ZOCOR) 40 MG tablet, TAKE 1 TABLET(40 MG) BY MOUTH AT BEDTIME, Disp: 90 tablet, Rfl: 3   Medications ordered in this encounter:  No orders of the defined types were placed in this encounter.    *If you need refills on other medications prior to your next appointment, please contact your pharmacy*  Follow-Up: Call back or seek an in-person evaluation if the symptoms worsen or if the condition fails to improve as anticipated.  [facility] [phone]  Other Instructions Take antibiotic (Azithromycin) as directed.  Increase fluids.  Get plenty of rest. Use Mucinex for congestion. Use the Tessalon as directed. Take a daily probiotic (I recommend Align or Culturelle, but even Activia Yogurt may be  beneficial).  A humidifier placed in the bedroom may offer some relief for a dry, scratchy throat of nasal irritation.  Read information below on acute bronchitis. Please call or return to clinic if symptoms are not improving.  Acute Bronchitis Bronchitis is when the airways that extend from the windpipe into the lungs get red, puffy, and painful (inflamed). Bronchitis often causes thick spit (mucus) to develop. This leads to a cough. A cough is the most common symptom of bronchitis. In acute bronchitis, the condition usually begins suddenly and goes away over time (usually in 2 weeks). Smoking, allergies, and asthma can make bronchitis worse. Repeated episodes of bronchitis may cause more lung problems.  HOME CARE Rest. Drink enough fluids to keep your pee (urine) clear or pale yellow (unless you need to limit fluids as told by your doctor). Only take over-the-counter or prescription medicines as told by your doctor. Avoid smoking and secondhand smoke. These can make bronchitis worse. If you are a smoker, think about using nicotine gum or skin patches. Quitting smoking will help your lungs heal faster. Reduce the chance of getting bronchitis again by: Washing your hands often. Avoiding people with cold symptoms. Trying not to touch your hands to your mouth, nose, or eyes. Follow up with your doctor as told.  GET HELP IF: Your symptoms do not improve after 1 week of treatment. Symptoms include: Cough. Fever. Coughing up thick spit. Body aches. Chest congestion. Chills. Shortness of breath. Sore throat.  GET HELP RIGHT AWAY IF:  You have an increased fever. You have chills. You have  severe shortness of breath. You have bloody thick spit (sputum). You throw up (vomit) often. You lose too much body fluid (dehydration). You have a severe headache. You faint.  MAKE SURE YOU:  Understand these instructions. Will watch your condition. Will get help right away if you are not doing  well or get worse. Document Released: 08/26/2007 Document Revised: 11/09/2012 Document Reviewed: 08/30/2012 [facility] Patient Information 2015 [place], [place]. This information is not intended to replace advice given to you by your health care provider. Make sure you discuss any questions you have with your health care provider.    If you have been instructed to have an in-person evaluation today at a local Urgent Care facility, please use the link below. It will take you to a list of all of our available [facility], including address, phone number and hours of operation. Please do not delay care.  [facility]  If you or a family member do not have a primary care provider, use the link below to schedule a visit and establish care. When you choose a [facility] primary care physician or advanced practice provider, you gain a long-term partner in health. Find a Primary Care Provider  Learn more about [facility]'s in-office and virtual care options: [facility] - Get Care Now

## 2022-07-30 NOTE — Progress Notes (Signed)
Virtual Visit Consent   Natalie Martinez, you are scheduled for a virtual visit with a [facility] provider today. Just as with appointments in the office, your consent must be obtained to participate. Your consent will be active for this visit and any virtual visit you may have with one of our providers in the next 365 days. If you have a MyChart account, a copy of this consent can be sent to you electronically.  As this is a virtual visit, video technology does not allow for your provider to perform a traditional examination. This may limit your provider's ability to fully assess your condition. If your provider identifies any concerns that need to be evaluated in person or the need to arrange testing (such as labs, EKG, etc.), we will make arrangements to do so. Although advances in technology are sophisticated, we cannot ensure that it will always work on either your end or our end. If the connection with a video visit is poor, the visit may have to be switched to a telephone visit. With either a video or telephone visit, we are not always able to ensure that we have a secure connection.  By engaging in this virtual visit, you consent to the provision of healthcare and authorize for your insurance to be billed (if applicable) for the services provided during this visit. Depending on your insurance coverage, you may receive a charge related to this service.  I need to obtain your verbal consent now. Are you willing to proceed with your visit today? Natalie Martinez has provided verbal consent on 07/30/2022 for a virtual visit (video or telephone). Natalie Martinez, [place]  Date: 07/30/2022 9:05 AM  Virtual Visit via Video Note   I, Natalie Martinez, connected with  Natalie Martinez  ([id number], [birth year]) on 07/30/22 at  9:00 AM EDT by a video-enabled telemedicine application and verified that I am speaking with the correct person using two identifiers.  Location: Patient:  Virtual Visit Location Patient: Home Provider: Virtual Visit Location Provider: Home Office   I discussed the limitations of evaluation and management by telemedicine and the availability of in person appointments. The patient expressed understanding and agreed to proceed.    History of Present Illness: Natalie Martinez is a 57 y.o. who identifies as a female who was assigned female at birth, and is being seen today for cough and congestion starting last week and worsening over the weekend now with thick, yellow phlegm. Denies fever, chills. Chest tenderness with coughing. Denies nasal congestion, ear pain/tooth pain. Denies recent travel or sick contact. Is about to leave for a trip to [place].  OTC -- Advil Liquid Gels.  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/02/2017   Bilateral sacroiliitis (HCC) 11/02/2017   Class 1 obesity due to excess calories without serious comorbidity with body mass index (BMI) of 30.0 to 30.9 in adult 11/02/2017   Healthcare maintenance 06/21/2017   History of condyloma acuminatum 06/21/2017   Bone cyst of left tibia 06/21/2017  Dyslipidemia with elevated low density lipoprotein (LDL) cholesterol and abnormally low high density lipoprotein cholesterol 12/18/2016   Abnormal vision 03/26/2016   Osteoarthritis of knee 03/26/2016   Uricosuria 03/26/2016   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/07/2012   ASCUS (atypical squamous cells of undetermined  significance) on Pap smear 04/06/2012   Hypercholesterolemia     Allergies:  Allergies  Allergen Reactions   Atorvastatin Nausea Only   Medications:  Current Outpatient Medications:    azithromycin (ZITHROMAX) 250 MG tablet, Take 2 tablets on day 1, then 1 tablet daily on days 2 through 5, Disp: 6 tablet, Rfl: 0   benzonatate (TESSALON) 100 MG capsule, Take 1 capsule (100 mg total) by mouth 3 (three) times daily as needed for cough., Disp: 30 capsule, Rfl: 0   Cholecalciferol (VITAMIN D3) 125 MCG (5000 UT) capsule, Take 5,000 Units by mouth daily., Disp: , Rfl:    Cyanocobalamin (VITAMIN B 12 PO), Take 1,000 mg by mouth daily at 6 (six) AM., Disp: , Rfl:    diclofenac (VOLTAREN) 75 MG EC tablet, Take 75 mg by mouth daily., Disp: , Rfl:    Elastic Bandages & Supports (WRIST SPLINT/COCK-UP/RIGHT SM) MISC, Wear splint continuously over the next 4 weeks. (Patient not taking: Reported on 05/26/2022), Disp: 1 each, Rfl: 0   simvastatin (ZOCOR) 40 MG tablet, TAKE 1 TABLET(40 MG) BY MOUTH AT BEDTIME, Disp: 90 tablet, Rfl: 3  Observations/Objective: Patient is well-developed, well-nourished in no acute distress.  Resting comfortably  at home.  Head is normocephalic, atraumatic.  No labored breathing. Speech is clear and coherent with logical content.  Patient is alert and oriented at baseline.   Assessment and Plan: 1. Acute bacterial bronchitis - benzonatate (TESSALON) 100 MG capsule; Take 1 capsule (100 mg total) by mouth 3 (three) times daily as needed for cough.  Dispense: 30 capsule; Refill: 0 - azithromycin (ZITHROMAX) 250 MG tablet; Take 2 tablets on day 1, then 1 tablet daily on days 2 through 5  Dispense: 6 tablet; Refill: 0  Rx Azithromycin.  Increase fluids.  Rest.  Saline nasal spray.  Probiotic.  Mucinex as directed.  Humidifier in bedroom. Tessalon per orders.  Call or return to clinic if symptoms are not improving.   Follow Up Instructions: I discussed the assessment and  treatment plan with the patient. The patient was provided an opportunity to ask questions and all were answered. The patient agreed with the plan and demonstrated an understanding of the instructions.  A copy of instructions were sent to the patient via MyChart unless otherwise noted below.   The patient was advised to call back or seek an in-person evaluation if the symptoms worsen or if the condition fails to improve as anticipated.  Time:  I spent 10 minutes with the patient via telehealth technology discussing the above problems/concerns.    Natalie Climes, PA-C

## 2022-08-20 IMAGING — MG MM DIGITAL SCREENING BILAT W/ TOMO AND CAD
8 series · 9 of 24 positions shown · non-contrast
Comparison: Previous exam(s).

CLINICAL DATA: Screening.

EXAM:
DIGITAL SCREENING BILATERAL MAMMOGRAM WITH TOMOSYNTHESIS AND CAD
TECHNIQUE: Bilateral screening digital craniocaudal and mediolateral oblique
mammograms were obtained. Bilateral screening digital breast
tomosynthesis was performed. The images were evaluated with
computer-aided detection.

[R CC synth-2D]
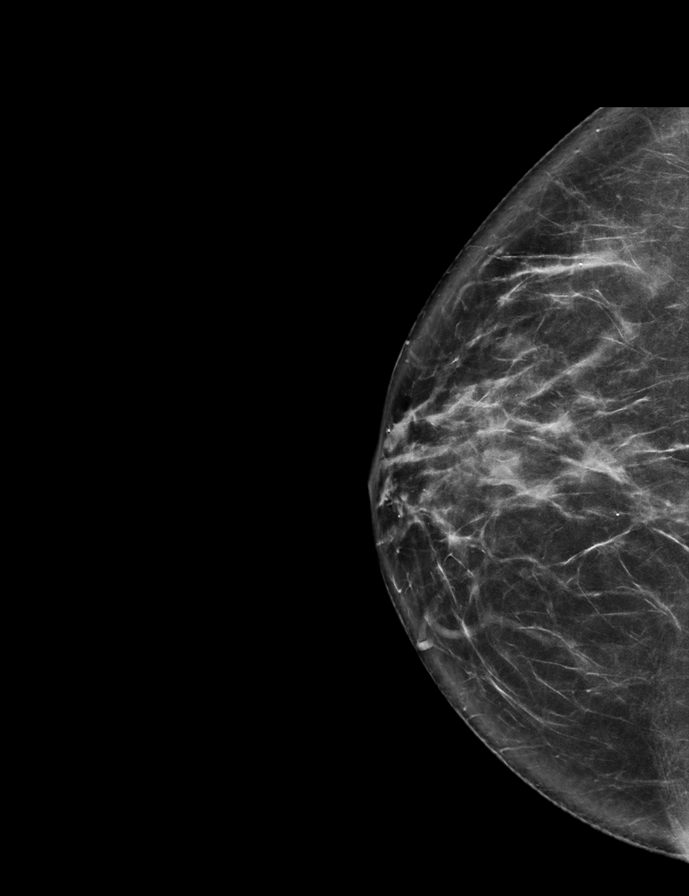

[L MLO synth-2D]
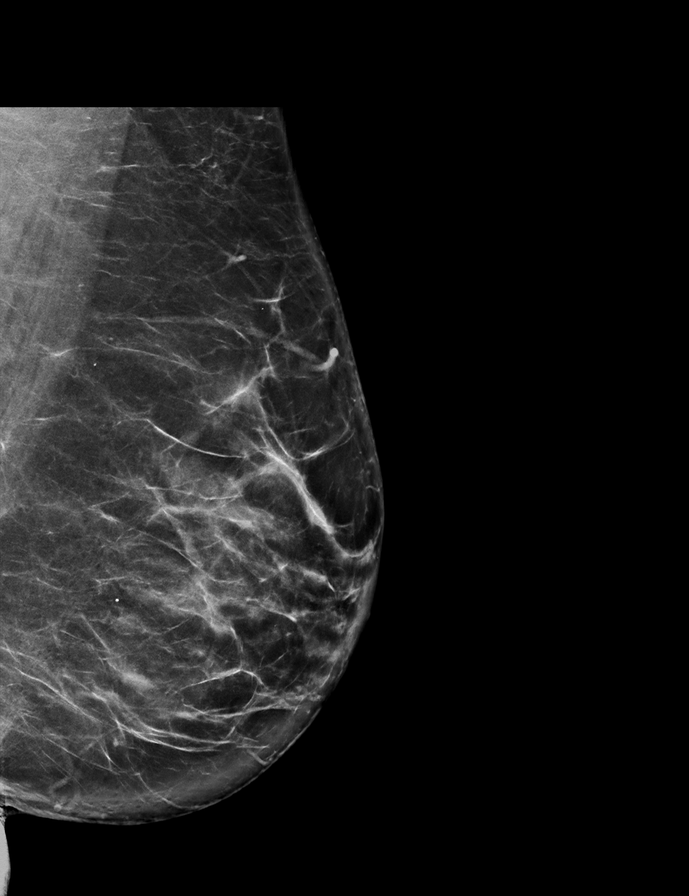

[L CC synth-2D]
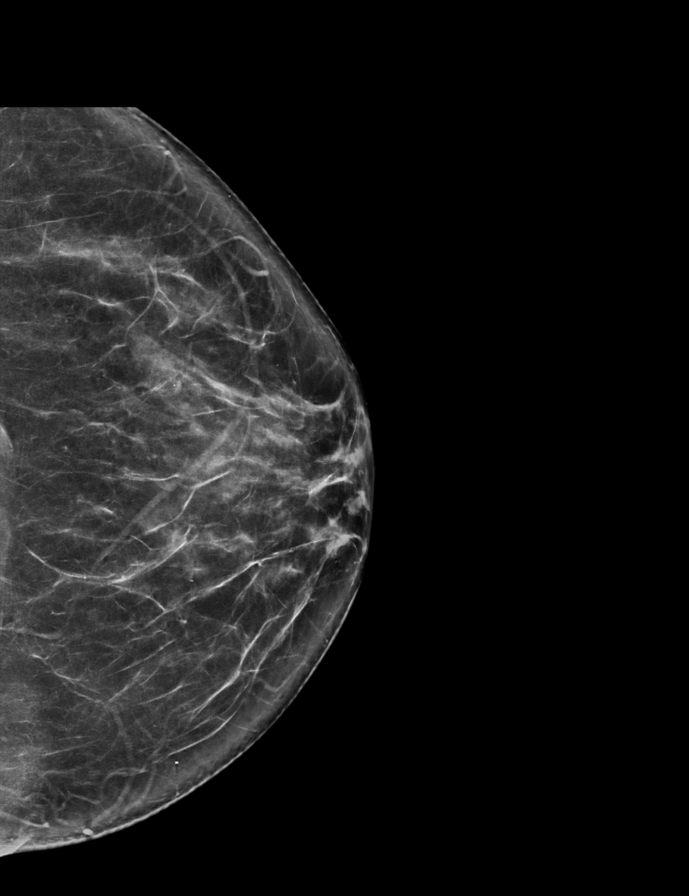

[R MLO synth-2D]
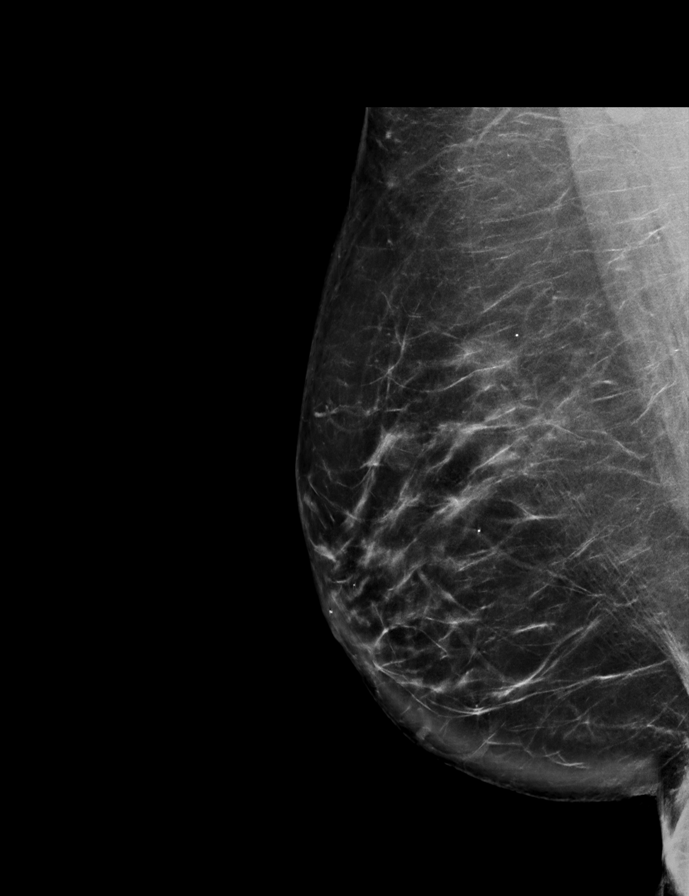

[L CC tomo · 2 of 73 frames shown]
[frame 24/73]
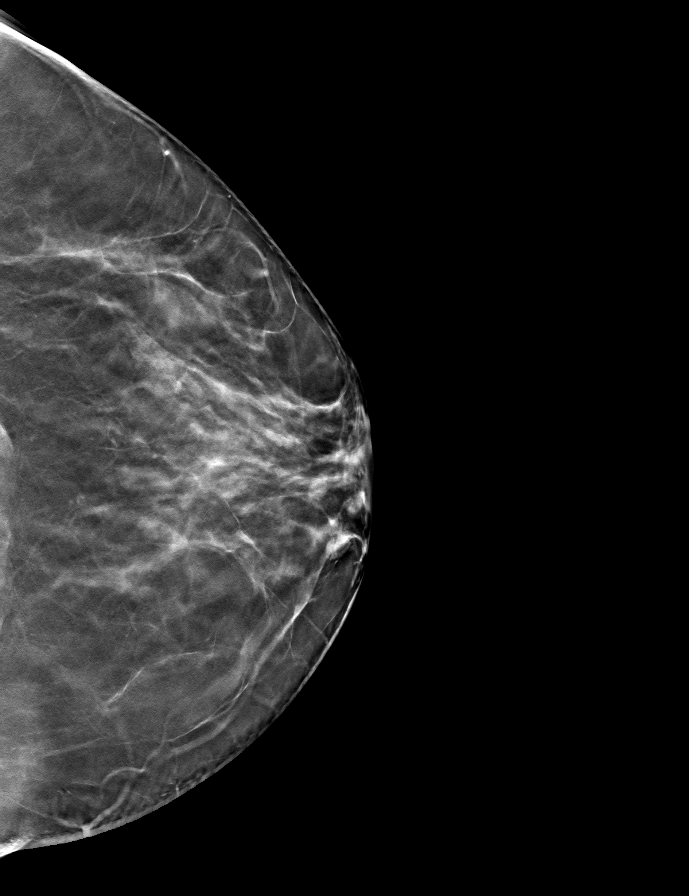
[frame 37/73]
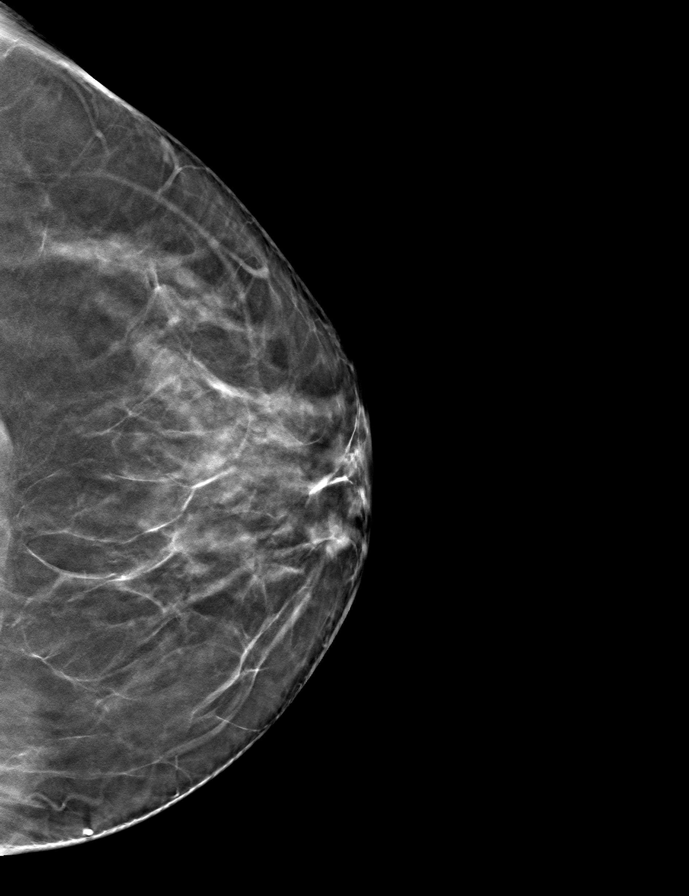

[R CC tomo · tomo slice 36/71.0]
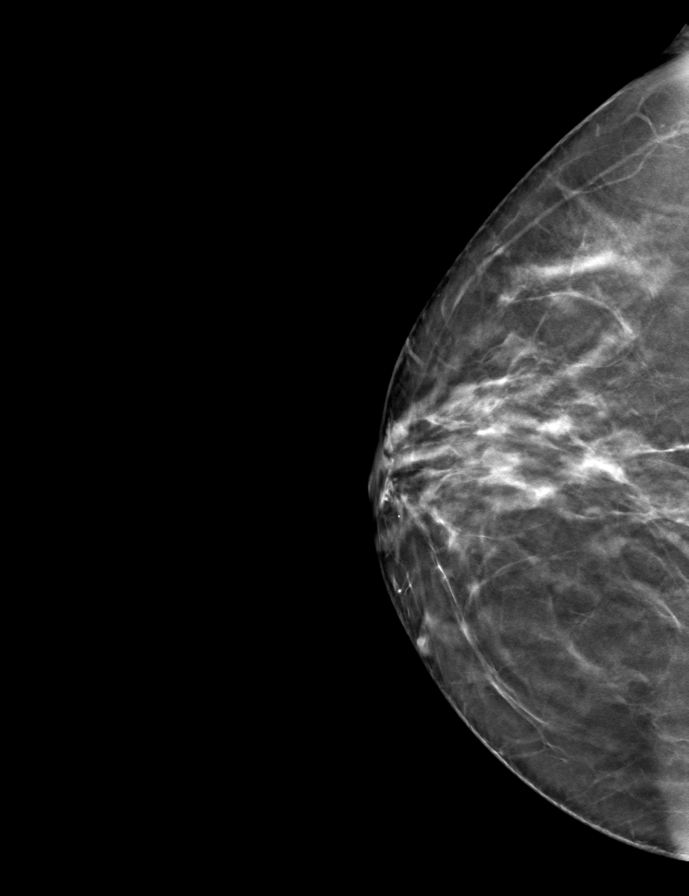

[R MLO tomo · tomo slice 47/93.0]
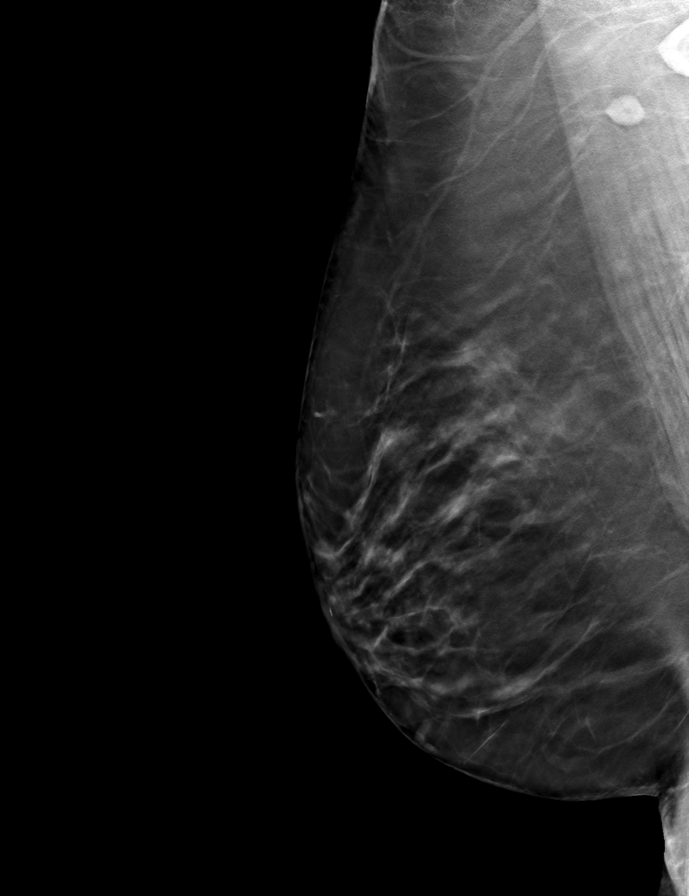

[L MLO tomo · tomo slice 41/81.0]
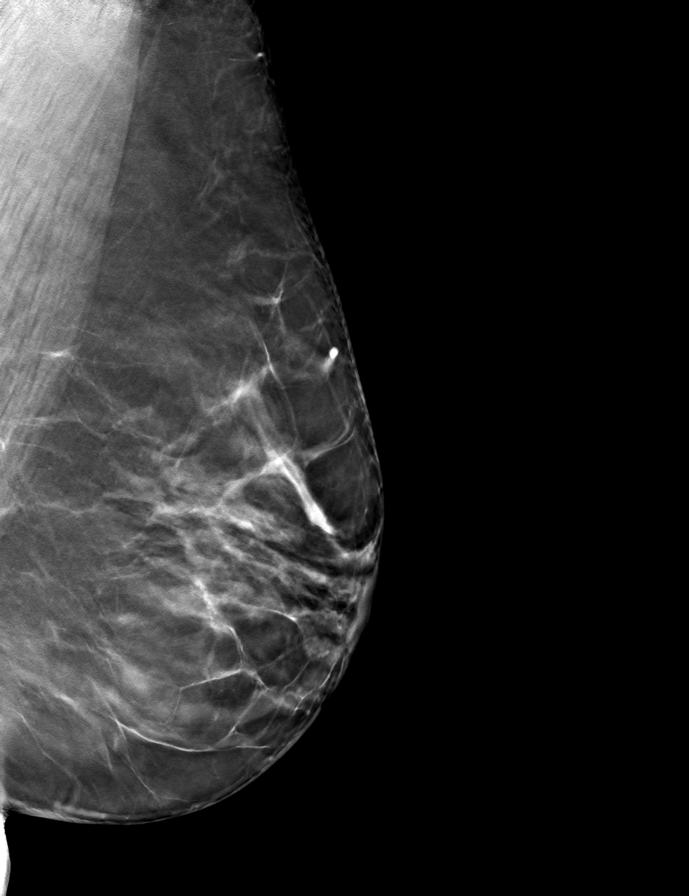

[9 of 24 positions shown; findings below may reference images not displayed]

ACR Breast Density Category c: The breast tissue is heterogeneously
dense, which may obscure small masses.
FINDINGS: There are no findings suspicious for malignancy.
IMPRESSION: No mammographic evidence of malignancy. A result letter of this
screening mammogram will be mailed directly to the patient.

RECOMMENDATION:
Screening mammogram in one year. (Code:Q3-W-BC3)

BI-RADS CATEGORY  1: Negative.

## 2022-10-07 ENCOUNTER — Other Ambulatory Visit: Payer: Self-pay | Admitting: Family Medicine

## 2022-10-07 DIAGNOSIS — Z1231 Encounter for screening mammogram for malignant neoplasm of breast: Secondary | ICD-10-CM

## 2022-10-13 NOTE — Progress Notes (Deleted)
57 y.o. [id number] Single  female here for annual exam.    PCP:     No LMP recorded. Patient has had a hysterectomy.           Sexually active: {yes no:314532}  The current method of family planning is status post hysterectomy.    Exercising: {yes no:314532}  {types:19826} Smoker:  no  Health Maintenance: Pap:  10/22/21 neg, 10/01/20 neg History of abnormal Pap:  {YES NO:22349} MMG:  11/12/21 Breast Density Cat C, BI-RADS CAT 1 neg Colonoscopy:  06/26/22 BMD:   n/a  Result  n/a TDaP:  01/20/16 Gardasil:   no HIV: 01/16/20 NR Hep C: 01/16/20 NR Screening Labs:  Hb today: ***, Urine today: ***   reports that she has never smoked. She has never used smokeless tobacco. She reports that she does not drink alcohol and does not use drugs.  Past Medical History:  Diagnosis Date   Allergy    Arthritis    Hypercholesterolemia    Neuromuscular disorder (HCC)    sciatic pain    NSVD (normal spontaneous vaginal delivery)    X3   Vertigo     Past Surgical History:  Procedure Laterality Date   CERVICAL BIOPSY  W/ LOOP ELECTRODE EXCISION  2004  &  2005   X 2   DILATION AND CURETTAGE OF UTERUS  2000 & 2003   VAGINAL HYSTERECTOMY  2005   RECURRENT CERVICAL DYSPLASIA    Current Outpatient Medications  Medication Sig Dispense Refill   benzonatate (TESSALON) 100 MG capsule Take 1 capsule (100 mg total) by mouth 3 (three) times daily as needed for cough. 30 capsule 0   Cholecalciferol (VITAMIN D3) 125 MCG (5000 UT) capsule Take 5,000 Units by mouth daily.     Cyanocobalamin (VITAMIN B 12 PO) Take 1,000 mg by mouth daily at 6 (six) AM.     diclofenac (VOLTAREN) 75 MG EC tablet Take 75 mg by mouth daily.     Elastic Bandages & Supports (WRIST SPLINT/COCK-UP/RIGHT SM) MISC Wear splint continuously over the next 4 weeks. (Patient not taking: Reported on 05/26/2022) 1 each 0   simvastatin (ZOCOR) 40 MG tablet TAKE 1 TABLET(40 MG) BY MOUTH AT BEDTIME 90 tablet 3   No current facility-administered  medications for this visit.    Family History  Problem Relation Age of Onset   Diabetes Mother    Healthy Sister    Healthy Brother    Healthy Brother    Breast cancer Neg Hx    Colon cancer Neg Hx    Colon polyps Neg Hx    Esophageal cancer Neg Hx    Rectal cancer Neg Hx    Stomach cancer Neg Hx     Review of Systems  Exam:   There were no vitals taken for this visit.    General appearance: alert, cooperative and appears stated age Head: normocephalic, without obvious abnormality, atraumatic Neck: no adenopathy, supple, symmetrical, trachea midline and thyroid normal to inspection and palpation Lungs: clear to auscultation bilaterally Breasts: normal appearance, no masses or tenderness, No nipple retraction or dimpling, No nipple discharge or bleeding, No axillary adenopathy Heart: regular rate and rhythm Abdomen: soft, non-tender; no masses, no organomegaly Extremities: extremities normal, atraumatic, no cyanosis or edema Skin: skin color, texture, turgor normal. No rashes or lesions Lymph nodes: cervical, supraclavicular, and axillary nodes normal. Neurologic: grossly normal  Pelvic: External genitalia:  no lesions              No  abnormal inguinal nodes palpated.              Urethra:  normal appearing urethra with no masses, tenderness or lesions              Bartholins and Skenes: normal                 Vagina: normal appearing vagina with normal color and discharge, no lesions              Cervix: no lesions              Pap taken: {yes no:314532} Bimanual Exam:  Uterus:  normal size, contour, position, consistency, mobility, non-tender              Adnexa: no mass, fullness, tenderness              Rectal exam: {yes no:314532}.  Confirms.              Anus:  normal sphincter tone, no lesions  Chaperone was present for exam:  ***  Assessment:   Well woman visit with gynecologic exam.   Plan: Mammogram screening discussed. Self breast awareness  reviewed. Pap and HR HPV as above. Guidelines for Calcium, Vitamin D, regular exercise program including cardiovascular and weight bearing exercise.   Follow up annually and prn.   Additional counseling given.  {yes [id number]. _______ minutes face to face time of which over 50% was spent in counseling.    After visit summary provided.

## 2022-10-27 ENCOUNTER — Ambulatory Visit: Payer: BC Managed Care – PPO | Admitting: Obstetrics and Gynecology

## 2022-10-27 ENCOUNTER — Ambulatory Visit: Payer: BC Managed Care – PPO | Admitting: Obstetrics & Gynecology

## 2022-10-28 ENCOUNTER — Encounter: Payer: BC Managed Care – PPO | Admitting: Family Medicine

## 2022-11-11 ENCOUNTER — Ambulatory Visit: Payer: BC Managed Care – PPO | Admitting: Family Medicine

## 2022-11-11 ENCOUNTER — Encounter: Payer: Self-pay | Admitting: Family Medicine

## 2022-11-11 VITALS — BP 108/82 | HR 65 | Temp 98.4°F | Ht 60.0 in | Wt 156.2 lb

## 2022-11-11 DIAGNOSIS — R8290 Unspecified abnormal findings in urine: Secondary | ICD-10-CM

## 2022-11-11 DIAGNOSIS — E785 Hyperlipidemia, unspecified: Secondary | ICD-10-CM | POA: Diagnosis not present

## 2022-11-11 DIAGNOSIS — F341 Dysthymic disorder: Secondary | ICD-10-CM | POA: Diagnosis not present

## 2022-11-11 DIAGNOSIS — Z131 Encounter for screening for diabetes mellitus: Secondary | ICD-10-CM | POA: Diagnosis not present

## 2022-11-11 DIAGNOSIS — E538 Deficiency of other specified B group vitamins: Secondary | ICD-10-CM

## 2022-11-11 DIAGNOSIS — E559 Vitamin D deficiency, unspecified: Secondary | ICD-10-CM

## 2022-11-11 DIAGNOSIS — Z0000 Encounter for general adult medical examination without abnormal findings: Secondary | ICD-10-CM | POA: Diagnosis not present

## 2022-11-11 LAB — URINALYSIS, ROUTINE W REFLEX MICROSCOPIC
Bilirubin Urine: NEGATIVE
Ketones, ur: NEGATIVE
Nitrite: POSITIVE — AB
Specific Gravity, Urine: 1.02 (ref 1.000–1.030)
Total Protein, Urine: NEGATIVE
Urine Glucose: NEGATIVE
Urobilinogen, UA: 0.2 (ref 0.0–1.0)
pH: 6 (ref 5.0–8.0)

## 2022-11-11 LAB — LIPID PANEL
Cholesterol: 236 mg/dL — ABNORMAL HIGH (ref 0–200)
HDL: 43.8 mg/dL (ref >39.00)
NonHDL: 192.25
Total CHOL/HDL Ratio: 5
Triglycerides: 239 mg/dL — ABNORMAL HIGH (ref 0.0–149.0)
VLDL: 47.8 mg/dL — ABNORMAL HIGH (ref 0.0–40.0)

## 2022-11-11 LAB — LDL CHOLESTEROL, DIRECT: Direct LDL: 163 mg/dL

## 2022-11-11 LAB — COMPREHENSIVE METABOLIC PANEL
ALT: 15 U/L (ref 0–35)
AST: 18 U/L (ref 0–37)
Albumin: 4.5 g/dL (ref 3.5–5.2)
Alkaline Phosphatase: 93 U/L (ref 39–117)
BUN: 12 mg/dL (ref 6–23)
CO2: 33 meq/L — ABNORMAL HIGH (ref 19–32)
Calcium: 9.9 mg/dL (ref 8.4–10.5)
Chloride: 104 meq/L (ref 96–112)
Creatinine, Ser: 0.6 mg/dL (ref 0.40–1.20)
GFR: 101.48 mL/min (ref >60.00)
Glucose, Bld: 95 mg/dL (ref 70–99)
Potassium: 4.5 meq/L (ref 3.5–5.1)
Sodium: 142 meq/L (ref 135–145)
Total Bilirubin: 0.8 mg/dL (ref 0.2–1.2)
Total Protein: 7.2 g/dL (ref 6.0–8.3)

## 2022-11-11 LAB — CBC
HCT: 41.7 % (ref 36.0–46.0)
Hemoglobin: 13.9 g/dL (ref 12.0–15.0)
MCHC: 33.2 g/dL (ref 30.0–36.0)
MCV: 94.4 fl (ref 78.0–100.0)
Platelets: 250 10*3/uL (ref 150.0–400.0)
RBC: 4.42 Mil/uL (ref 3.87–5.11)
RDW: 13.5 % (ref 11.5–15.5)
WBC: 4.5 10*3/uL (ref 4.0–10.5)

## 2022-11-11 LAB — HEMOGLOBIN A1C: Hgb A1c MFr Bld: 6 % (ref 4.6–6.5)

## 2022-11-11 NOTE — Progress Notes (Addendum)
Established Patient Office Visit   Subjective:  Patient ID: Natalie Martinez, female    DOB: 1966-02-10  Age: 57 y.o. MRN: [id number]  Chief Complaint  Patient presents with   Annual Exam    CPE: Pt is fasting. Pt states she is doing well. No concerns    HPI Encounter Diagnoses  Name Primary?   Healthcare maintenance Yes   Dyslipidemia with elevated low density lipoprotein (LDL) cholesterol and abnormally low high density lipoprotein cholesterol    Vitamin D deficiency    B12 deficiency    Dysthymia    Screening for diabetes mellitus    Abnormal urinalysis    For health check and follow-up of above.  She is up-to-date on her health maintenance.  She lives at home with her daughter who is starting her second year at [employer].  Daughter has not decided on a major.  Continues full-time work.  She has regular dental care.  Not currently exercising much.  Denies frank depression but admits to loss of interest in some of the things that used to bring her joy such as exercising and dancing.  No family history of diabetes.   Review of Systems  Constitutional: Negative.   HENT: Negative.    Eyes:  Negative for blurred vision, discharge and redness.  Respiratory: Negative.    Cardiovascular: Negative.   Gastrointestinal:  Negative for abdominal pain.  Genitourinary: Negative.   Musculoskeletal: Negative.  Negative for myalgias.  Skin:  Negative for rash.  Neurological:  Negative for tingling, loss of consciousness and weakness.  Endo/Heme/Allergies:  Negative for polydipsia.  Psychiatric/Behavioral:  Negative for depression. The patient is nervous/anxious.      Current Outpatient Medications:    Cholecalciferol (VITAMIN D3) 125 MCG (5000 UT) capsule, Take 5,000 Units by mouth daily., Disp: , Rfl:    Cyanocobalamin (VITAMIN B 12 PO), Take 1,000 mg by mouth daily at 6 (six) AM., Disp: , Rfl:    simvastatin (ZOCOR) 40 MG tablet, TAKE 1 TABLET(40 MG) BY MOUTH AT BEDTIME, Disp: 90  tablet, Rfl: 3   Objective:     BP 108/82   Pulse 65   Temp 98.4 F (36.9 C)   Ht 5' (1.524 m)   Wt 156 lb 3.2 oz (70.9 kg)   SpO2 97%   BMI 30.51 kg/m  Wt Readings from Last 3 Encounters:  11/11/22 156 lb 3.2 oz (70.9 kg)  06/26/22 161 lb (73 kg)  05/26/22 161 lb (73 kg)      Physical Exam Constitutional:      General: She is not in acute distress.    Appearance: Normal appearance. She is not ill-appearing, toxic-appearing or diaphoretic.  HENT:     Head: Normocephalic and atraumatic.     Right Ear: Tympanic membrane, ear canal and external ear normal.     Left Ear: Tympanic membrane, ear canal and external ear normal.     Mouth/Throat:     Mouth: Mucous membranes are moist.     Pharynx: Oropharynx is clear. No oropharyngeal exudate or posterior oropharyngeal erythema.  Eyes:     General: No scleral icterus.       Right eye: No discharge.        Left eye: No discharge.     Extraocular Movements: Extraocular movements intact.     Conjunctiva/sclera: Conjunctivae normal.     Pupils: Pupils are equal, round, and reactive to light.  Cardiovascular:     Rate and Rhythm: Normal rate and regular rhythm.  Pulmonary:     Effort: Pulmonary effort is normal. No respiratory distress.     Breath sounds: Normal breath sounds. No wheezing, rhonchi or rales.  Abdominal:     General: Bowel sounds are normal.     Tenderness: There is no right CVA tenderness or left CVA tenderness.  Musculoskeletal:     Cervical back: No rigidity or tenderness.  Skin:    General: Skin is warm and dry.  Neurological:     Mental Status: She is alert and oriented to person, place, and time.  Psychiatric:        Mood and Affect: Mood normal.        Behavior: Behavior normal.      Results for orders placed or performed in visit on 11/11/22  CBC  Result Value Ref Range   WBC 4.5 4.0 - 10.5 K/uL   RBC 4.42 3.87 - 5.11 Mil/uL   Platelets 250.0 150.0 - 400.0 K/uL   Hemoglobin 13.9 12.0 - 15.0  g/dL   HCT 16.1 09.6 - 04.5 %   MCV 94.4 78.0 - 100.0 fl   MCHC 33.2 30.0 - 36.0 g/dL   RDW 40.9 81.1 - 91.4 %  Comprehensive metabolic panel  Result Value Ref Range   Sodium 142 135 - 145 mEq/L   Potassium 4.5 3.5 - 5.1 mEq/L   Chloride 104 96 - 112 mEq/L   CO2 33 (H) 19 - 32 mEq/L   Glucose, Bld 95 70 - 99 mg/dL   BUN 12 6 - 23 mg/dL   Creatinine, Ser 7.82 0.40 - 1.20 mg/dL   Total Bilirubin 0.8 0.2 - 1.2 mg/dL   Alkaline Phosphatase 93 39 - 117 U/L   AST 18 0 - 37 U/L   ALT 15 0 - 35 U/L   Total Protein 7.2 6.0 - 8.3 g/dL   Albumin 4.5 3.5 - 5.2 g/dL   GFR 956.21 >30.86 mL/min   Calcium 9.9 8.4 - 10.5 mg/dL  Hemoglobin V7Q  Result Value Ref Range   Hgb A1c MFr Bld 6.0 4.6 - 6.5 %  Lipid panel  Result Value Ref Range   Cholesterol 236 (H) 0 - 200 mg/dL   Triglycerides 469.6 (H) 0.0 - 149.0 mg/dL   HDL 29.52 >84.13 mg/dL   VLDL 24.4 (H) 0.0 - 01.0 mg/dL   Total CHOL/HDL Ratio 5    NonHDL 192.25   Urinalysis, Routine w reflex microscopic  Result Value Ref Range   Color, Urine YELLOW Yellow;Lt. Yellow;Straw;Dark Yellow;Amber;Green;Red;Brown   APPearance Cloudy (A) Clear;Turbid;Slightly Cloudy;Cloudy   Specific Gravity, Urine 1.020 1.000 - 1.030   pH 6.0 5.0 - 8.0   Total Protein, Urine NEGATIVE Negative   Urine Glucose NEGATIVE Negative   Ketones, ur NEGATIVE Negative   Bilirubin Urine NEGATIVE Negative   Hgb urine dipstick MODERATE (A) Negative   Urobilinogen, UA 0.2 0.0 - 1.0   Leukocytes,Ua TRACE (A) Negative   Nitrite POSITIVE (A) Negative   WBC, UA 11-20/hpf (A) 0-2/hpf   RBC / HPF 3-6/hpf (A) 0-2/hpf   Mucus, UA Presence of (A) None   Squamous Epithelial / HPF Rare(0-4/hpf) Rare(0-4/hpf)   Bacteria, UA Many(>50/hpf) (A) None  TSH  Result Value Ref Range   TSH 2.04 0.35 - 5.50 uIU/mL  VITAMIN D 25 Hydroxy (Vit-D Deficiency, Fractures)  Result Value Ref Range   VITD 32.18 30.00 - 100.00 ng/mL  Vitamin B12  Result Value Ref Range   Vitamin B-12 >1501 (H)  211 - 911 pg/mL  LDL  cholesterol, direct  Result Value Ref Range   Direct LDL 163.0 mg/dL      The 16-XWRU ASCVD risk score (Arnett DK, et al., 2019) is: 2%    Assessment & Plan:   Healthcare maintenance -     Urinalysis, Routine w reflex microscopic  Dyslipidemia with elevated low density lipoprotein (LDL) cholesterol and abnormally low high density lipoprotein cholesterol -     CBC -     Comprehensive metabolic panel -     Lipid panel  Vitamin D deficiency -     VITAMIN D 25 Hydroxy (Vit-D Deficiency, Fractures)  B12 deficiency -     CBC -     Vitamin B12  Dysthymia -     CBC -     TSH  Screening for diabetes mellitus -     Hemoglobin A1c  Abnormal urinalysis -     Urinalysis, Routine w reflex microscopic; Future -     Urine Culture; Future  Other orders -     LDL cholesterol, direct    Return in about 6 months (around 05/14/2023).  Given information regarding health maintenance and preventative health.  Also given information on preventing high cholesterol and exercising to lose weight.  Advised that exercise could also help elevate her mood.  Patient is not ready to take a medication.  Advised that talking therapy is available.  Asked her to let me know if she might be interested.  Information was given on managing depression.  Mliss Sax, MD

## 2022-11-12 NOTE — Progress Notes (Signed)
57 y.o. [id number] Single  female here for annual exam.    Has hot flashes, which are lessening.  No ERT.   Last intercourse was one year ago.  She desires STD screening.   Has 3 children and 5 grandchildren. 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   PCP:   Dr. Doreene Burke  No LMP recorded. Patient has had a hysterectomy.   Ovaries remain.        Sexually active: No.  The current method of family planning is status post hysterectomy.    Exercising: Yes.     walking  and biking Smoker:  no  Health Maintenance: Pap:  10/22/21 neg, 10/01/20 neg.  Final pathology of cervix showed benign cervix. History of abnormal Pap:  no MMG: scheduled 11/2022  11/12/21 Breast Density Cat C, BI-RADS CAT 1 neg Colonoscopy:  06/26/22 - polyp.  She will do every 4 years.  BMD:   n/a  Result  n/a TDaP:  01/20/16 Gardasil:   no HIV: 01/16/20 NR Hep C: 01/16/20 NR Screening Labs:  PCP   reports that she has never smoked. She has never used smokeless tobacco. She reports that she does not drink alcohol and does not use drugs.  Past Medical History:  Diagnosis Date   Allergy    Arthritis    Hypercholesterolemia    Neuromuscular disorder (HCC)    sciatic pain    NSVD (normal spontaneous vaginal delivery)    X3   Vertigo     Past Surgical History:  Procedure Laterality Date   CERVICAL BIOPSY  W/ LOOP ELECTRODE EXCISION  2004  &  2005   X 2   DILATION AND CURETTAGE OF UTERUS  2000 & 2003   VAGINAL HYSTERECTOMY  2005   RECURRENT CERVICAL DYSPLASIA    Current Outpatient Medications  Medication Sig Dispense Refill   Cholecalciferol (VITAMIN D3) 125 MCG (5000 UT) capsule Take 5,000 Units by mouth daily.     Cyanocobalamin (VITAMIN B 12 PO) Take 1,000 mg by mouth daily at 6 (six) AM.     rosuvastatin (CRESTOR) 20 MG tablet Take 1 tablet (20 mg total) by mouth daily. 90 tablet 1   sulfamethoxazole-trimethoprim (BACTRIM DS) 800-160 MG tablet Take 1 tablet by mouth 2 (two) times daily for 7 days. 14 tablet 0   No current  facility-administered medications for this visit.    Family History  Problem Relation Age of Onset   Diabetes Mother    Healthy Sister    Healthy Brother    Healthy Brother    Breast cancer Neg Hx    Colon cancer Neg Hx    Colon polyps Neg Hx    Esophageal cancer Neg Hx    Rectal cancer Neg Hx    Stomach cancer Neg Hx     Review of Systems  All other systems reviewed and are negative.   Exam:   BP 120/76 (BP Location: Left Arm, Patient Position: Sitting, Cuff Size: Normal)   Pulse 74   Ht 5' 1.5" (1.562 m)   Wt 155 lb (70.3 kg)   SpO2 98%   BMI 28.81 kg/m     General appearance: alert, cooperative and appears stated age Head: normocephalic, without obvious abnormality, atraumatic Neck: no adenopathy, supple, symmetrical, trachea midline and thyroid normal to inspection and palpation Lungs: clear to auscultation bilaterally Breasts: normal appearance, no masses or tenderness, No nipple retraction or dimpling, No nipple discharge or bleeding, No axillary adenopathy Heart: regular rate and rhythm Abdomen: soft, non-tender; no masses, no  organomegaly Extremities: extremities normal, atraumatic, no cyanosis or edema Skin: skin color, texture, turgor normal. No rashes or lesions Lymph nodes: cervical, supraclavicular, and axillary nodes normal. Neurologic: grossly normal  Pelvic: External genitalia:  no lesions              No abnormal inguinal nodes palpated.              Urethra:  normal appearing urethra with no masses, tenderness or lesions              Bartholins and Skenes: normal                 Vagina: normal appearing vagina with normal color and discharge, no lesions              Cervix: absent              Pap taken: no Bimanual Exam:  Uterus:  absent              Adnexa: no mass, fullness, tenderness              Rectal exam: yes.  Confirms.              Anus:  normal sphincter tone, no lesions  Chaperone was present for exam:  Warren Lacy, CMA  Assessment:    Well woman visit with gynecologic exam. Status post vaginal hysterectomy for recurrent dysplasia.  Ovaries remain.  Status post LEEP for CIN III in 2004 and 2005. STD screening.   Plan: Mammogram screening discussed. Self breast awareness reviewed. Pap and HR HPV collected. STD screening.  Guidelines for Calcium, Vitamin D, regular exercise program including cardiovascular and weight bearing exercise.   Follow up annually and prn.

## 2022-11-13 LAB — TSH: TSH: 2.04 u[IU]/mL (ref 0.35–5.50)

## 2022-11-13 LAB — VITAMIN B12: Vitamin B-12: >1501 pg/mL — ABNORMAL HIGH (ref 211–911)

## 2022-11-13 LAB — VITAMIN D 25 HYDROXY (VIT D DEFICIENCY, FRACTURES): VITD: 32.18 ng/mL (ref 30.00–100.00)

## 2022-11-13 NOTE — Addendum Note (Signed)
Addended by: Andrez Grime on: 11/13/2022 10:10 AM   Modules accepted: Orders

## 2022-11-16 ENCOUNTER — Ambulatory Visit: Payer: BC Managed Care – PPO

## 2022-11-17 ENCOUNTER — Other Ambulatory Visit (INDEPENDENT_AMBULATORY_CARE_PROVIDER_SITE_OTHER): Payer: BC Managed Care – PPO

## 2022-11-17 DIAGNOSIS — R8290 Unspecified abnormal findings in urine: Secondary | ICD-10-CM | POA: Diagnosis not present

## 2022-11-17 DIAGNOSIS — N390 Urinary tract infection, site not specified: Secondary | ICD-10-CM

## 2022-11-17 LAB — URINALYSIS, ROUTINE W REFLEX MICROSCOPIC
Bilirubin Urine: NEGATIVE
Ketones, ur: NEGATIVE
Nitrite: POSITIVE — AB
Specific Gravity, Urine: 1.015 (ref 1.000–1.030)
Total Protein, Urine: NEGATIVE
Urine Glucose: NEGATIVE
Urobilinogen, UA: 0.2 (ref 0.0–1.0)
pH: 6 (ref 5.0–8.0)

## 2022-11-17 MED ORDER — ROSUVASTATIN CALCIUM 20 MG PO TABS: 20.0000 mg | ORAL_TABLET | Freq: Every day | ORAL | 1 refills | Status: DC

## 2022-11-17 NOTE — Addendum Note (Signed)
Addended by: Andrez Grime on: 11/17/2022 01:04 PM   Modules accepted: Orders

## 2022-11-20 LAB — URINE CULTURE
MICRO NUMBER:: 15387764
SPECIMEN QUALITY:: ADEQUATE

## 2022-11-20 MED ORDER — SULFAMETHOXAZOLE-TRIMETHOPRIM 800-160 MG PO TABS: 1.0000 | ORAL_TABLET | Freq: Two times a day (BID) | ORAL | 0 refills | Status: AC

## 2022-11-20 NOTE — Addendum Note (Signed)
Addended by: Andrez Grime on: 11/20/2022 02:53 PM   Modules accepted: Orders

## 2022-11-26 ENCOUNTER — Other Ambulatory Visit (HOSPITAL_COMMUNITY)
Admission: RE | Admit: 2022-11-26 | Discharge: 2022-11-26 | Disposition: A | Discharge status: Home / Self Care | Payer: BC Managed Care – PPO | Source: Ambulatory Visit | Attending: Family Medicine | Admitting: Family Medicine

## 2022-11-26 ENCOUNTER — Encounter: Payer: Self-pay | Admitting: Obstetrics and Gynecology

## 2022-11-26 ENCOUNTER — Ambulatory Visit (INDEPENDENT_AMBULATORY_CARE_PROVIDER_SITE_OTHER): Payer: BC Managed Care – PPO | Admitting: Obstetrics and Gynecology

## 2022-11-26 VITALS — BP 120/76 | HR 74 | Ht 61.5 in | Wt 155.0 lb

## 2022-11-26 DIAGNOSIS — Z1151 Encounter for screening for human papillomavirus (HPV): Secondary | ICD-10-CM | POA: Diagnosis not present

## 2022-11-26 DIAGNOSIS — Z01419 Encounter for gynecological examination (general) (routine) without abnormal findings: Secondary | ICD-10-CM | POA: Diagnosis not present

## 2022-11-26 DIAGNOSIS — Z87410 Personal history of cervical dysplasia: Secondary | ICD-10-CM

## 2022-11-26 DIAGNOSIS — Z114 Encounter for screening for human immunodeficiency virus [HIV]: Secondary | ICD-10-CM

## 2022-11-26 DIAGNOSIS — Z1272 Encounter for screening for malignant neoplasm of vagina: Secondary | ICD-10-CM | POA: Insufficient documentation

## 2022-11-26 DIAGNOSIS — Z1159 Encounter for screening for other viral diseases: Secondary | ICD-10-CM

## 2022-11-26 DIAGNOSIS — Z113 Encounter for screening for infections with a predominantly sexual mode of transmission: Secondary | ICD-10-CM

## 2022-11-26 NOTE — Patient Instructions (Signed)

## 2022-11-27 LAB — CYTOLOGY - PAP
Chlamydia: NEGATIVE
Comment: NEGATIVE
Comment: NEGATIVE
Comment: NEGATIVE
Comment: NORMAL
Diagnosis: NEGATIVE
High risk HPV: NEGATIVE
Neisseria Gonorrhea: NEGATIVE
Trichomonas: NEGATIVE

## 2022-12-01 ENCOUNTER — Ambulatory Visit: Payer: BC Managed Care – PPO | Admitting: Obstetrics and Gynecology

## 2022-12-17 ENCOUNTER — Other Ambulatory Visit: Payer: Self-pay | Admitting: Obstetrics & Gynecology

## 2022-12-17 DIAGNOSIS — Z1231 Encounter for screening mammogram for malignant neoplasm of breast: Secondary | ICD-10-CM

## 2022-12-21 ENCOUNTER — Ambulatory Visit: Payer: BC Managed Care – PPO

## 2022-12-26 ENCOUNTER — Ambulatory Visit
Admission: RE | Admit: 2022-12-26 | Discharge: 2022-12-26 | Disposition: A | Discharge status: Home / Self Care | Payer: BC Managed Care – PPO | Source: Ambulatory Visit | Attending: Family Medicine | Admitting: Family Medicine

## 2022-12-26 DIAGNOSIS — Z1231 Encounter for screening mammogram for malignant neoplasm of breast: Secondary | ICD-10-CM

## 2023-04-17 ENCOUNTER — Other Ambulatory Visit: Payer: Self-pay | Admitting: Family Medicine

## 2023-04-17 DIAGNOSIS — E785 Hyperlipidemia, unspecified: Secondary | ICD-10-CM

## 2023-04-27 ENCOUNTER — Encounter: Payer: Self-pay | Admitting: Internal Medicine

## 2023-04-27 ENCOUNTER — Telehealth (INDEPENDENT_AMBULATORY_CARE_PROVIDER_SITE_OTHER): Payer: Self-pay | Admitting: Internal Medicine

## 2023-04-27 ENCOUNTER — Ambulatory Visit: Payer: Self-pay | Admitting: Family Medicine

## 2023-04-27 VITALS — Ht 61.0 in | Wt 154.0 lb

## 2023-04-27 DIAGNOSIS — J069 Acute upper respiratory infection, unspecified: Secondary | ICD-10-CM | POA: Diagnosis not present

## 2023-04-27 MED ORDER — PROMETHAZINE-DM 6.25-15 MG/5ML PO SYRP: 5.0000 mL | ORAL_SOLUTION | Freq: Four times a day (QID) | ORAL | 0 refills | Status: DC | PRN

## 2023-04-27 MED ORDER — NOREL AD 4-10-325 MG PO TABS: 1.0000 | ORAL_TABLET | Freq: Three times a day (TID) | ORAL | 0 refills | Status: AC | PRN

## 2023-04-27 NOTE — Progress Notes (Signed)
 Virtual Visit via Video Note   Because of Natalie Martinez's co-morbid illnesses, she is at least at moderate risk for complications without adequate follow up.  This format is felt to be most appropriate for this patient at this time.  All issues noted in this document were discussed and addressed.  A limited physical exam was performed with this format.      Evaluation Performed:  Follow-up visit  Date:  04/27/2023   ID:  Natalie Martinez, DOB Aug 12, 1965, MRN [id number]  Patient Location: Home Provider Location: Office/Clinic  Participants: Patient Location of Patient: Home Location of Provider: Telehealth Consent was obtain for visit to be over via telehealth. I verified that I am speaking with the correct person using two identifiers.  PCP:  Berneta Elsie Sayre, MD   Chief Complaint: Fever, nasal congestion, fatigue and cough  History of Present Illness:    Natalie Martinez is a 58 y.o. female who has a video visit for complaint of fever, myalgias, nasal congestion, sinus pressure related headache and cough for the last 2 days.  She reports that a lot of her workplace colleagues have similar symptoms.  She denies any dyspnea or wheezing currently.  She has not tried any OTC medicine yet.  The patient does have symptoms concerning for COVID-19 infection (fever, chills, cough, or new shortness of breath).   Past Medical, Surgical, Social History, Allergies, and Medications have been Reviewed.  Past Medical History:  Diagnosis Date   Allergy    Arthritis    Hypercholesterolemia    Neuromuscular disorder (HCC)    sciatic pain    NSVD (normal spontaneous vaginal delivery)    X3   Vertigo    Past Surgical History:  Procedure Laterality Date   CERVICAL BIOPSY  W/ LOOP ELECTRODE EXCISION  2004  &  2005   X 2   DILATION AND CURETTAGE OF UTERUS  2000 & 2003   VAGINAL HYSTERECTOMY  2005   RECURRENT CERVICAL DYSPLASIA     Current Meds  Medication  Sig   Cholecalciferol (VITAMIN D3) 125 MCG (5000 UT) capsule Take 5,000 Units by mouth daily.   Cyanocobalamin  (VITAMIN B 12 PO) Take 1,000 mg by mouth daily at 6 (six) AM.   rosuvastatin  (CRESTOR ) 20 MG tablet Take 1 tablet (20 mg total) by mouth daily.     Allergies:   Atorvastatin    ROS:   Please see the history of present illness. All other systems reviewed and are negative.   Labs/Other Tests and Data Reviewed:    Recent Labs: 11/11/2022: ALT 15; BUN 12; Creatinine, Ser 0.60; Hemoglobin 13.9; Platelets 250.0; Potassium 4.5; Sodium 142; TSH 2.04   Recent Lipid Panel Lab Results  Component Value Date/Time   CHOL 236 (H) 11/11/2022 09:06 AM   CHOL 244 (H) 12/18/2016 10:05 AM   TRIG 239.0 (H) 11/11/2022 09:06 AM   HDL 43.80 11/11/2022 09:06 AM   HDL 41 12/18/2016 10:05 AM   CHOLHDL 5 11/11/2022 09:06 AM   LDLCALC 247 (H) 05/04/2022 08:29 AM   LDLCALC 167 (H) 12/18/2016 10:05 AM   LDLDIRECT 163.0 11/11/2022 09:06 AM    Wt Readings from Last 3 Encounters:  04/27/23 154 lb (69.9 kg)  11/26/22 155 lb (70.3 kg)  11/11/22 156 lb 3.2 oz (70.9 kg)     Objective:    Vital Signs:  Ht 5' 1 (1.549 m)   Wt 154 lb (69.9 kg)   BMI 29.10 kg/m    VITAL  SIGNS:  reviewed GEN:  no acute distress EYES:  sclerae anicteric, EOMI - Extraocular Movements Intact RESPIRATORY:  normal respiratory effort, symmetric expansion NEURO:  alert and oriented x 3, no obvious focal deficit PSYCH:  normal affect  ASSESSMENT & PLAN:    URTI Her symptoms are suggestive of viral URTI Check flu, COVID and RSV testing at her PCP office - her current PCP office has been notified Norel AD as needed for nasal congestion Promethazine  DM syrup as needed for cough Maintain adequate hydration   I discussed the assessment and treatment plan with the patient. The patient was provided an opportunity to ask questions, and all were answered. The patient agreed with the plan and demonstrated an understanding  of the instructions.   The patient was advised to call back or seek an in-person evaluation if the symptoms worsen or if the condition fails to improve as anticipated.  The above assessment and management plan was discussed with the patient. The patient verbalized understanding of and has agreed to the management plan.   Medication Adjustments/Labs and Tests Ordered: Current medicines are reviewed at length with the patient today.  Concerns regarding medicines are outlined above.   Tests Ordered: No orders of the defined types were placed in this encounter.   Medication Changes: No orders of the defined types were placed in this encounter.    Note: This dictation was prepared with Dragon dictation along with smaller phrase technology. Similar sounding words can be transcribed inadequately or may not be corrected upon review. Any transcriptional errors that result from this process are unintentional.      Disposition:  Follow up  Signed, Suzzane MARLA Blanch, MD  04/27/2023 10:05 AM     [facility]

## 2023-04-27 NOTE — Telephone Encounter (Signed)
  Chief Complaint: influenza exposure with symptoms Symptoms: cough with green mucus, headache, sore throat, body aches, runny nose Frequency: started Sunday Pertinent Negatives: Patient denies chest pain, SOB Disposition: [] ED /[] Urgent Care (no appt availability in office) / [x] Appointment(In office/virtual)/ []  [facility]/ [] Home Care/ [] Refused Recommended Disposition /[] [facility]/ []  Follow-up with PCP Additional Notes: Patient denies offer for interpreter services. Patient states she prefers a virtual visit due to she does not feel well enough to come in to the office.    Copied from CRM [id number]. Topic: Clinical - Red Word Triage >> Apr 27, 2023  9:24 AM Leotis ORN wrote: [facility] that prompted transfer to Nurse Triage: fever, body aches, cough- flu like symptoms, present for 2 days Reason for Disposition  [1] Patient is NOT HIGH RISK AND [2] strongly requests antiviral medicine AND [3] flu symptoms present < 48 hours  Answer Assessment - Initial Assessment Questions 1. WORST SYMPTOM: What is your worst symptom? (e.g., cough, runny nose, muscle aches, headache, sore throat, fever)      Patient states last night she felt feverish, cough, headache.  2. ONSET: When did your flu symptoms start?      Sunday.  3. COUGH: How bad is the cough?       Patient states she is coughing up green mucus.  4. RESPIRATORY DISTRESS: Describe your breathing.      Denies.  5. FEVER: Do you have a fever? If Yes, ask: What is your temperature, how was it measured, and when did it start?     Felt feverish last night but states she does not have a thermometer.  6. EXPOSURE: Were you exposed to someone with influenza?       Patient states coworkers have the flu and states last week she visited her nephews on Sunday and was not aware they had the flu.  7. FLU VACCINE: Did you get a flu shot this year?     Yes.  8. HIGH RISK DISEASE: Do you have any chronic  medical problems? (e.g., heart or lung disease, asthma, weak immune system, or other HIGH RISK conditions)     Denies.  9. OTHER SYMPTOMS: Do you have any other symptoms?  (e.g., runny nose, muscle aches, headache, sore throat)       Body aches, sore throat, runny nose.  Protocols used: Influenza (Flu) - [facility]

## 2023-05-20 ENCOUNTER — Encounter: Payer: Self-pay | Admitting: Family Medicine

## 2023-05-20 ENCOUNTER — Ambulatory Visit: Payer: BC Managed Care – PPO | Admitting: Family Medicine

## 2023-05-20 VITALS — BP 118/68 | HR 70 | Temp 97.2°F | Ht 61.0 in | Wt 155.0 lb

## 2023-05-20 DIAGNOSIS — R8281 Pyuria: Secondary | ICD-10-CM | POA: Diagnosis not present

## 2023-05-20 DIAGNOSIS — Z87440 Personal history of urinary (tract) infections: Secondary | ICD-10-CM | POA: Insufficient documentation

## 2023-05-20 DIAGNOSIS — R7303 Prediabetes: Secondary | ICD-10-CM | POA: Insufficient documentation

## 2023-05-20 DIAGNOSIS — E785 Hyperlipidemia, unspecified: Secondary | ICD-10-CM

## 2023-05-20 LAB — LIPID PANEL
Cholesterol: 158 mg/dL (ref 0–200)
HDL: 42.1 mg/dL (ref >39.00)
LDL Cholesterol: 93 mg/dL (ref 0–99)
NonHDL: 116.27
Total CHOL/HDL Ratio: 4
Triglycerides: 117 mg/dL (ref 0.0–149.0)
VLDL: 23.4 mg/dL (ref 0.0–40.0)

## 2023-05-20 LAB — COMPREHENSIVE METABOLIC PANEL
ALT: 17 U/L (ref 0–35)
AST: 18 U/L (ref 0–37)
Albumin: 4.5 g/dL (ref 3.5–5.2)
Alkaline Phosphatase: 84 U/L (ref 39–117)
BUN: 12 mg/dL (ref 6–23)
CO2: 32 meq/L (ref 19–32)
Calcium: 9.6 mg/dL (ref 8.4–10.5)
Chloride: 104 meq/L (ref 96–112)
Creatinine, Ser: 0.61 mg/dL (ref 0.40–1.20)
GFR: 100.71 mL/min (ref >60.00)
Glucose, Bld: 88 mg/dL (ref 70–99)
Potassium: 4.4 meq/L (ref 3.5–5.1)
Sodium: 143 meq/L (ref 135–145)
Total Bilirubin: 0.7 mg/dL (ref 0.2–1.2)
Total Protein: 7.6 g/dL (ref 6.0–8.3)

## 2023-05-20 LAB — HEMOGLOBIN A1C: Hgb A1c MFr Bld: 6.1 % (ref 4.6–6.5)

## 2023-05-20 NOTE — Progress Notes (Signed)
 Established Patient Office Visit   Subjective:  Patient ID: Natalie Martinez, female    DOB: 02-10-1966  Age: 58 y.o. MRN: [id number]  No chief complaint on file.   HPI Encounter Diagnoses  Name Primary?   Dyslipidemia with elevated low density lipoprotein (LDL) cholesterol and abnormally low high density lipoprotein cholesterol Yes   Prediabetes    History of UTI    Follow-up of above.  Doing well with the rosuvastatin.  No issues taking it.  Has been exercising by walking for 45 minutes daily.  No history of gestational diabetes but mom had diabetes.   ROS   Current Outpatient Medications:    Chlorphen-PE-Acetaminophen (NOREL AD) 4-10-325 MG TABS, Take 1 tablet by mouth 3 (three) times daily as needed (Nasal congestion)., Disp: 30 tablet, Rfl: 0   Cholecalciferol (VITAMIN D3) 125 MCG (5000 UT) capsule, Take 5,000 Units by mouth daily., Disp: , Rfl:    Cyanocobalamin (VITAMIN B 12 PO), Take 1,000 mg by mouth daily at 6 (six) AM., Disp: , Rfl:    promethazine-dextromethorphan (PROMETHAZINE-DM) 6.25-15 MG/5ML syrup, Take 5 mLs by mouth 4 (four) times daily as needed for cough., Disp: 118 mL, Rfl: 0   rosuvastatin (CRESTOR) 20 MG tablet, Take 1 tablet (20 mg total) by mouth daily., Disp: 90 tablet, Rfl: 1   Objective:     BP 118/68   Pulse 70   Temp (!) 97.2 F (36.2 C)   Ht 5\' 1"  (1.549 m)   Wt 155 lb (70.3 kg)   SpO2 95%   BMI 29.29 kg/m  BP Readings from Last 3 Encounters:  05/20/23 118/68  11/26/22 120/76  11/11/22 108/82   Wt Readings from Last 3 Encounters:  05/20/23 155 lb (70.3 kg)  04/27/23 154 lb (69.9 kg)  11/26/22 155 lb (70.3 kg)      Physical Exam Constitutional:      General: She is not in acute distress.    Appearance: Normal appearance. She is not ill-appearing, toxic-appearing or diaphoretic.  HENT:     Head: Normocephalic and atraumatic.     Right Ear: External ear normal.     Left Ear: External ear normal.     Mouth/Throat:      Mouth: Mucous membranes are moist.     Pharynx: Oropharynx is clear. No oropharyngeal exudate or posterior oropharyngeal erythema.  Eyes:     General: No scleral icterus.       Right eye: No discharge.        Left eye: No discharge.     Extraocular Movements: Extraocular movements intact.     Conjunctiva/sclera: Conjunctivae normal.     Pupils: Pupils are equal, round, and reactive to light.  Cardiovascular:     Rate and Rhythm: Normal rate and regular rhythm.  Pulmonary:     Effort: Pulmonary effort is normal. No respiratory distress.     Breath sounds: Normal breath sounds. No wheezing or rales.  Musculoskeletal:     Cervical back: No rigidity or tenderness.  Skin:    General: Skin is warm and dry.  Neurological:     Mental Status: She is alert and oriented to person, place, and time.  Psychiatric:        Mood and Affect: Mood normal.        Behavior: Behavior normal.      No results found for any visits on 05/20/23.    The 10-year ASCVD risk score (Arnett DK, et al., 2019) is: 2.5%  Assessment & Plan:   Dyslipidemia with elevated low density lipoprotein (LDL) cholesterol and abnormally low high density lipoprotein cholesterol -     Comprehensive metabolic panel -     Lipid panel  Prediabetes -     Comprehensive metabolic panel -     Hemoglobin A1c  History of UTI -     Urinalysis w microscopic + reflex cultur    Return in about 6 months (around 11/17/2023).  Continue healthy active lifestyle.  Information given on exercising to lose weight as well as preventing high cholesterol and type 2 diabetes.  Continue with rosuvastatin.  Mliss Sax, MD

## 2023-05-22 LAB — URINALYSIS W MICROSCOPIC + REFLEX CULTURE
Bacteria, UA: NONE SEEN /HPF
Bilirubin Urine: NEGATIVE
Glucose, UA: NEGATIVE
Hyaline Cast: NONE SEEN /LPF
Ketones, ur: NEGATIVE
Nitrites, Initial: NEGATIVE
Protein, ur: NEGATIVE
RBC / HPF: NONE SEEN /HPF (ref 0–2)
Specific Gravity, Urine: 1.004 (ref 1.001–1.035)
WBC, UA: NONE SEEN /HPF (ref 0–5)
pH: 7 (ref 5.0–8.0)

## 2023-05-22 LAB — URINE CULTURE
MICRO NUMBER:: 16141286
Result:: NO GROWTH
SPECIMEN QUALITY:: ADEQUATE

## 2023-05-22 LAB — CULTURE INDICATED

## 2023-06-15 ENCOUNTER — Other Ambulatory Visit: Payer: Self-pay | Admitting: Family Medicine

## 2023-06-15 DIAGNOSIS — E785 Hyperlipidemia, unspecified: Secondary | ICD-10-CM

## 2023-11-04 ENCOUNTER — Other Ambulatory Visit: Payer: Self-pay | Admitting: Family Medicine

## 2023-11-04 DIAGNOSIS — E785 Hyperlipidemia, unspecified: Secondary | ICD-10-CM

## 2023-11-15 ENCOUNTER — Other Ambulatory Visit: Payer: Self-pay | Admitting: Family Medicine

## 2023-11-15 DIAGNOSIS — Z1231 Encounter for screening mammogram for malignant neoplasm of breast: Secondary | ICD-10-CM

## 2023-11-26 ENCOUNTER — Ambulatory Visit: Admitting: Family Medicine

## 2023-11-26 ENCOUNTER — Encounter: Payer: Self-pay | Admitting: Family Medicine

## 2023-11-26 VITALS — BP 128/78 | HR 71 | Temp 96.5°F | Ht 61.0 in | Wt 159.8 lb

## 2023-11-26 DIAGNOSIS — E559 Vitamin D deficiency, unspecified: Secondary | ICD-10-CM | POA: Diagnosis not present

## 2023-11-26 DIAGNOSIS — E785 Hyperlipidemia, unspecified: Secondary | ICD-10-CM | POA: Diagnosis not present

## 2023-11-26 DIAGNOSIS — Z0000 Encounter for general adult medical examination without abnormal findings: Secondary | ICD-10-CM | POA: Diagnosis not present

## 2023-11-26 DIAGNOSIS — R7303 Prediabetes: Secondary | ICD-10-CM

## 2023-11-26 DIAGNOSIS — Z23 Encounter for immunization: Secondary | ICD-10-CM

## 2023-11-26 DIAGNOSIS — E538 Deficiency of other specified B group vitamins: Secondary | ICD-10-CM

## 2023-11-26 LAB — CBC WITH DIFFERENTIAL/PLATELET
Basophils Absolute: 0 K/uL (ref 0.0–0.1)
Basophils Relative: 0.4 % (ref 0.0–3.0)
Eosinophils Absolute: 0.2 K/uL (ref 0.0–0.7)
Eosinophils Relative: 5.4 % — ABNORMAL HIGH (ref 0.0–5.0)
HCT: 40.9 % (ref 36.0–46.0)
Hemoglobin: 13.6 g/dL (ref 12.0–15.0)
Lymphocytes Relative: 35.3 % (ref 12.0–46.0)
Lymphs Abs: 1.4 K/uL (ref 0.7–4.0)
MCHC: 33.2 g/dL (ref 30.0–36.0)
MCV: 94.1 fl (ref 78.0–100.0)
Monocytes Absolute: 0.3 K/uL (ref 0.1–1.0)
Monocytes Relative: 6.6 % (ref 3.0–12.0)
Neutro Abs: 2 K/uL (ref 1.4–7.7)
Neutrophils Relative %: 52.3 % (ref 43.0–77.0)
Platelets: 214 K/uL (ref 150.0–400.0)
RBC: 4.35 Mil/uL (ref 3.87–5.11)
RDW: 13.2 % (ref 11.5–15.5)
WBC: 3.9 K/uL — ABNORMAL LOW (ref 4.0–10.5)

## 2023-11-26 LAB — LIPID PANEL
Cholesterol: 227 mg/dL — ABNORMAL HIGH (ref 0–200)
HDL: 50.3 mg/dL (ref >39.00)
LDL Cholesterol: 129 mg/dL — ABNORMAL HIGH (ref 0–99)
NonHDL: 176.49
Total CHOL/HDL Ratio: 5
Triglycerides: 236 mg/dL — ABNORMAL HIGH (ref 0.0–149.0)
VLDL: 47.2 mg/dL — ABNORMAL HIGH (ref 0.0–40.0)

## 2023-11-26 LAB — COMPREHENSIVE METABOLIC PANEL WITH GFR
ALT: 17 U/L (ref 0–35)
AST: 20 U/L (ref 0–37)
Albumin: 4.5 g/dL (ref 3.5–5.2)
Alkaline Phosphatase: 81 U/L (ref 39–117)
BUN: 13 mg/dL (ref 6–23)
CO2: 32 meq/L (ref 19–32)
Calcium: 9.7 mg/dL (ref 8.4–10.5)
Chloride: 102 meq/L (ref 96–112)
Creatinine, Ser: 0.54 mg/dL (ref 0.40–1.20)
GFR: 103.33 mL/min (ref >60.00)
Glucose, Bld: 90 mg/dL (ref 70–99)
Potassium: 4.2 meq/L (ref 3.5–5.1)
Sodium: 142 meq/L (ref 135–145)
Total Bilirubin: 1 mg/dL (ref 0.2–1.2)
Total Protein: 7.7 g/dL (ref 6.0–8.3)

## 2023-11-26 LAB — URINALYSIS, ROUTINE W REFLEX MICROSCOPIC
Bilirubin Urine: NEGATIVE
Ketones, ur: NEGATIVE
Nitrite: NEGATIVE
Specific Gravity, Urine: 1.01 (ref 1.000–1.030)
Total Protein, Urine: NEGATIVE
Urine Glucose: NEGATIVE
Urobilinogen, UA: 0.2 (ref 0.0–1.0)
pH: 7 (ref 5.0–8.0)

## 2023-11-26 LAB — HEMOGLOBIN A1C: Hgb A1c MFr Bld: 6.3 % (ref 4.6–6.5)

## 2023-11-26 LAB — VITAMIN D 25 HYDROXY (VIT D DEFICIENCY, FRACTURES): VITD: 26.56 ng/mL — ABNORMAL LOW (ref 30.00–100.00)

## 2023-11-26 LAB — VITAMIN B12: Vitamin B-12: 1476 pg/mL — ABNORMAL HIGH (ref 211–911)

## 2023-11-26 NOTE — Progress Notes (Signed)
 Established Patient Office Visit   Subjective:  Patient ID: Natalie Martinez, female    DOB: 12-27-1965  Age: 58 y.o. MRN: [id number]  Chief Complaint  Patient presents with   Annual Exam    CPE. Pt is fasting. Flu shot today.    HPI Encounter Diagnoses  Name Primary?   Healthcare maintenance Yes   Dyslipidemia with elevated low density lipoprotein (LDL) cholesterol and abnormally low high density lipoprotein cholesterol    Prediabetes    B12 deficiency    Vitamin D  deficiency    Immunization due    For physical and follow-up of above.  Mostly doing well.  Continues to work full-time.  Exercising for 45 minutes by walking 3 times weekly.  Has some issues with insomnia that is mostly relieved with melatonin.  Has regular dental care.  Up-to-date on health maintenance.   Review of Systems  Constitutional: Negative.   HENT: Negative.    Eyes:  Negative for blurred vision, discharge and redness.  Respiratory: Negative.    Cardiovascular: Negative.   Gastrointestinal:  Negative for abdominal pain.  Genitourinary: Negative.   Musculoskeletal: Negative.  Negative for myalgias.  Skin:  Negative for rash.  Neurological:  Negative for tingling, loss of consciousness and weakness.  Endo/Heme/Allergies:  Negative for polydipsia.  Psychiatric/Behavioral:  Negative for depression. The patient has insomnia.       11/26/2023   10:50 AM 04/27/2023    9:58 AM 11/11/2022    8:57 AM  Depression screen PHQ 2/9  Decreased Interest 0 0 1  Down, Depressed, Hopeless 0 0 1  PHQ - 2 Score 0 0 2  Altered sleeping 2 0 2  Tired, decreased energy 0 0 2  Change in appetite 0 0 1  Feeling bad or failure about yourself  0 0 2  Trouble concentrating 0 0 1  Moving slowly or fidgety/restless 0 0 0  Suicidal thoughts 0 0 0  PHQ-9 Score 2 0 10  Difficult doing work/chores Not difficult at all Not difficult at all Not difficult at all      Current Outpatient Medications:     Chlorphen-PE-Acetaminophen  (NOREL AD) 4-10-325 MG TABS, Take 1 tablet by mouth 3 (three) times daily as needed (Nasal congestion)., Disp: 30 tablet, Rfl: 0   Cholecalciferol (VITAMIN D3) 125 MCG (5000 UT) capsule, Take 5,000 Units by mouth daily., Disp: , Rfl:    Cyanocobalamin  (VITAMIN B 12 PO), Take 1,000 mg by mouth daily at 6 (six) AM., Disp: , Rfl:    rosuvastatin  (CRESTOR ) 20 MG tablet, TAKE 1 TABLET(20 MG) BY MOUTH DAILY, Disp: 270 tablet, Rfl: 0   promethazine -dextromethorphan (PROMETHAZINE -DM) 6.25-15 MG/5ML syrup, Take 5 mLs by mouth 4 (four) times daily as needed for cough. (Patient not taking: Reported on 11/26/2023), Disp: 118 mL, Rfl: 0   Objective:     BP 128/78 (BP Location: Right Arm, Patient Position: Sitting, Cuff Size: Normal)   Pulse 71   Temp (!) 96.5 F (35.8 C) (Temporal)   Ht 5' 1 (1.549 m)   Wt 159 lb 12.8 oz (72.5 kg)   SpO2 97%   BMI 30.19 kg/m  Wt Readings from Last 3 Encounters:  11/26/23 159 lb 12.8 oz (72.5 kg)  05/20/23 155 lb (70.3 kg)  04/27/23 154 lb (69.9 kg)      Physical Exam Constitutional:      General: She is not in acute distress.    Appearance: Normal appearance. She is not ill-appearing, toxic-appearing or diaphoretic.  HENT:  Head: Normocephalic and atraumatic.     Right Ear: Tympanic membrane, ear canal and external ear normal.     Left Ear: Tympanic membrane, ear canal and external ear normal.     Mouth/Throat:     Mouth: Mucous membranes are moist.     Pharynx: Oropharynx is clear. No oropharyngeal exudate or posterior oropharyngeal erythema.  Eyes:     General: No scleral icterus.       Right eye: No discharge.        Left eye: No discharge.     Extraocular Movements: Extraocular movements intact.     Conjunctiva/sclera: Conjunctivae normal.     Pupils: Pupils are equal, round, and reactive to light.  Cardiovascular:     Rate and Rhythm: Normal rate and regular rhythm.  Pulmonary:     Effort: Pulmonary effort is normal.  No respiratory distress.     Breath sounds: Normal breath sounds. No wheezing or rales.  Abdominal:     General: Bowel sounds are normal.  Musculoskeletal:     Cervical back: No rigidity or tenderness.     Right lower leg: No edema.     Left lower leg: No edema.  Skin:    General: Skin is warm and dry.  Neurological:     Mental Status: She is alert and oriented to person, place, and time.  Psychiatric:        Mood and Affect: Mood normal.        Behavior: Behavior normal.      No results found for any visits on 11/26/23.    The 10-year ASCVD risk score (Arnett DK, et al., 2019) is: 2%    Assessment & Plan:   Healthcare maintenance -     CBC with Differential/Platelet -     Urinalysis, Routine w reflex microscopic  Dyslipidemia with elevated low density lipoprotein (LDL) cholesterol and abnormally low high density lipoprotein cholesterol -     Comprehensive metabolic panel with GFR -     Lipid panel  Prediabetes -     Comprehensive metabolic panel with GFR -     Hemoglobin A1c  B12 deficiency -     Vitamin B12  Vitamin D  deficiency -     VITAMIN D  25 Hydroxy (Vit-D Deficiency, Fractures)  Immunization due -     Flu vaccine trivalent PF, 6mos and older(Flulaval,Afluria,Fluarix,Fluzone)    Return in about 6 months (around 05/25/2024), or if symptoms worsen or fail to improve, for chronic disease follow-up.  Information given on health maintenance and disease prevention.  Information given on exercising to lose weight and preventing type 2 diabetes.  Information was given on insomnia.  Will continue melatonin as needed.  Above were all in Spanish.  Elsie Sim Lent, MD

## 2023-11-29 ENCOUNTER — Ambulatory Visit: Payer: Self-pay | Admitting: Family Medicine

## 2023-12-15 ENCOUNTER — Ambulatory Visit: Admitting: Obstetrics and Gynecology

## 2023-12-21 ENCOUNTER — Encounter: Payer: Self-pay | Admitting: Family Medicine

## 2023-12-21 ENCOUNTER — Ambulatory Visit (INDEPENDENT_AMBULATORY_CARE_PROVIDER_SITE_OTHER): Admitting: Family Medicine

## 2023-12-21 VITALS — BP 110/78 | HR 67 | Temp 96.2°F | Ht 61.0 in | Wt 158.4 lb

## 2023-12-21 DIAGNOSIS — R7303 Prediabetes: Secondary | ICD-10-CM

## 2023-12-21 DIAGNOSIS — E785 Hyperlipidemia, unspecified: Secondary | ICD-10-CM | POA: Diagnosis not present

## 2023-12-21 DIAGNOSIS — R8290 Unspecified abnormal findings in urine: Secondary | ICD-10-CM | POA: Diagnosis not present

## 2023-12-21 DIAGNOSIS — E559 Vitamin D deficiency, unspecified: Secondary | ICD-10-CM

## 2023-12-21 LAB — URINALYSIS, ROUTINE W REFLEX MICROSCOPIC
Bilirubin Urine: NEGATIVE
Ketones, ur: NEGATIVE
Nitrite: NEGATIVE
Specific Gravity, Urine: 1.01 (ref 1.000–1.030)
Total Protein, Urine: NEGATIVE
Urine Glucose: NEGATIVE
Urobilinogen, UA: 0.2 (ref 0.0–1.0)
pH: 6 (ref 5.0–8.0)

## 2023-12-21 MED ORDER — ROSUVASTATIN CALCIUM 20 MG PO TABS: 20.0000 mg | ORAL_TABLET | Freq: Every day | ORAL | 3 refills | Status: AC

## 2023-12-21 MED ORDER — VITAMIN D (ERGOCALCIFEROL) 1.25 MG (50000 UNIT) PO CAPS: 50000.0000 [IU] | ORAL_CAPSULE | ORAL | 1 refills | Status: AC

## 2023-12-21 NOTE — Progress Notes (Signed)
 Established Patient Office Visit   Subjective:  Patient ID: Natalie Martinez, female    DOB: 11/13/1965  Age: 58 y.o. MRN: [id number]  Chief Complaint  Patient presents with   Medical Management of Chronic Issues    Pt presents today to discuss labs. No specific questions or concerns       HPI Encounter Diagnoses  Name Primary?   Dyslipidemia with elevated low density lipoprotein (LDL) cholesterol and abnormally low high density lipoprotein cholesterol Yes   Prediabetes    Vitamin D  deficiency    Abnormal urinalysis    For follow-up of abnormal labs.  She has been taking her rosuvastatin  daily.  She has lowered the fat and cholesterol in her diet.  She is not currently exercising regularly.  No history of gestational diabetes.  Her mom did have diabetes.  No problems with urination   Review of Systems  Constitutional: Negative.   HENT: Negative.    Eyes:  Negative for blurred vision, discharge and redness.  Respiratory: Negative.    Cardiovascular: Negative.   Gastrointestinal:  Negative for abdominal pain.  Genitourinary: Negative.  Negative for dysuria, frequency and urgency.  Musculoskeletal: Negative.  Negative for myalgias.  Skin:  Negative for rash.  Neurological:  Negative for tingling, loss of consciousness and weakness.  Endo/Heme/Allergies:  Negative for polydipsia.     Current Outpatient Medications:    Chlorphen-PE-Acetaminophen  (NOREL AD) 4-10-325 MG TABS, Take 1 tablet by mouth 3 (three) times daily as needed (Nasal congestion)., Disp: 30 tablet, Rfl: 0   Vitamin D , Ergocalciferol , (DRISDOL ) 1.25 MG (50000 UNIT) CAPS capsule, Take 1 capsule (50,000 Units total) by mouth every 7 (seven) days., Disp: 12 capsule, Rfl: 1   rosuvastatin  (CRESTOR ) 20 MG tablet, Take 1 tablet (20 mg total) by mouth daily., Disp: 90 tablet, Rfl: 3   Objective:     BP 110/78 (BP Location: Left Arm, Patient Position: Sitting, Cuff Size: Normal)   Pulse 67   Temp (!) 96.2  F (35.7 C) (Temporal)   Ht 5' 1 (1.549 m)   Wt 158 lb 6.4 oz (71.8 kg)   SpO2 94%   BMI 29.93 kg/m  Wt Readings from Last 3 Encounters:  12/21/23 158 lb 6.4 oz (71.8 kg)  11/26/23 159 lb 12.8 oz (72.5 kg)  05/20/23 155 lb (70.3 kg)      Physical Exam Constitutional:      General: She is not in acute distress.    Appearance: Normal appearance. She is not ill-appearing, toxic-appearing or diaphoretic.  HENT:     Head: Normocephalic and atraumatic.     Right Ear: External ear normal.     Left Ear: External ear normal.     Mouth/Throat:     Mouth: Mucous membranes are moist.     Pharynx: Oropharynx is clear. No oropharyngeal exudate or posterior oropharyngeal erythema.  Eyes:     General: No scleral icterus.       Right eye: No discharge.        Left eye: No discharge.     Extraocular Movements: Extraocular movements intact.     Conjunctiva/sclera: Conjunctivae normal.     Pupils: Pupils are equal, round, and reactive to light.  Cardiovascular:     Rate and Rhythm: Normal rate and regular rhythm.  Pulmonary:     Effort: Pulmonary effort is normal. No respiratory distress.     Breath sounds: Normal breath sounds.  Abdominal:     General: Bowel sounds are normal.  Tenderness: There is no abdominal tenderness. There is no guarding.  Musculoskeletal:     Cervical back: No rigidity or tenderness.  Skin:    General: Skin is warm and dry.  Neurological:     Mental Status: She is alert and oriented to person, place, and time.  Psychiatric:        Mood and Affect: Mood normal.        Behavior: Behavior normal.      No results found for any visits on 12/21/23.    The 10-year ASCVD risk score (Arnett DK, et al., 2019) is: 1.8%    Assessment & Plan:   Dyslipidemia with elevated low density lipoprotein (LDL) cholesterol and abnormally low high density lipoprotein cholesterol -     Rosuvastatin  Calcium ; Take 1 tablet (20 mg total) by mouth daily.  Dispense: 90  tablet; Refill: 3  Prediabetes  Vitamin D  deficiency -     Vitamin D  (Ergocalciferol ); Take 1 capsule (50,000 Units total) by mouth every 7 (seven) days.  Dispense: 12 capsule; Refill: 1  Abnormal urinalysis -     Urinalysis, Routine w reflex microscopic    Return Follow-up in 6 months as previously planned.  Start high-dose weekly vitamin D  tablet.  Information given on preventing high cholesterol dyslipidemia as well as preventing type 2 diabetes.  All written in Spanish.  Encouraged regular exercise and weight loss.  Elsie Sim Lent, MD

## 2023-12-27 ENCOUNTER — Ambulatory Visit

## 2023-12-31 ENCOUNTER — Ambulatory Visit
Admission: RE | Admit: 2023-12-31 | Discharge: 2023-12-31 | Disposition: A | Discharge status: Home / Self Care | Source: Ambulatory Visit | Attending: Family Medicine | Admitting: Family Medicine

## 2023-12-31 DIAGNOSIS — Z1231 Encounter for screening mammogram for malignant neoplasm of breast: Secondary | ICD-10-CM | POA: Diagnosis not present

## 2024-07-04 ENCOUNTER — Ambulatory Visit: Admitting: Obstetrics and Gynecology
